# Patient Record
Sex: Male | Born: 1970 | Race: Black or African American | Hispanic: No | Smoking: Current some day smoker
Health system: Southern US, Community
[De-identification: ages and names within clinical notes are randomized; demographics above are authoritative.]

## PROBLEM LIST (undated history)

## (undated) DIAGNOSIS — I429 Cardiomyopathy, unspecified: Secondary | ICD-10-CM

## (undated) DIAGNOSIS — I10 Essential (primary) hypertension: Secondary | ICD-10-CM

## (undated) DIAGNOSIS — J45909 Unspecified asthma, uncomplicated: Secondary | ICD-10-CM

## (undated) DIAGNOSIS — F1010 Alcohol abuse, uncomplicated: Secondary | ICD-10-CM

## (undated) DIAGNOSIS — R0600 Dyspnea, unspecified: Secondary | ICD-10-CM

## (undated) HISTORY — PX: TEE WITHOUT CARDIOVERSION: SHX5443

## (undated) HISTORY — DX: Alcohol abuse, uncomplicated: F10.10

## (undated) HISTORY — DX: Cardiomyopathy, unspecified: I42.9

## (undated) HISTORY — DX: Essential (primary) hypertension: I10

## (undated) NOTE — *Deleted (*Deleted)
***In Progress*** Referring Physician: PCP: Reece Leader, DO PCP-Cardiologist: Thurmon Fair, MD  AHF: Dr. Shirlee Latch   HPI:  Bryan Guerrero is a 81 year old with a history of chronic systolic heart failure dating back to 2020, ETOH abuse, HTN, and tobacco abuse.  He was admitted in February 2020 with increased shortness of breath. At that time he was drinking 1/5 of liquor per day. ECHO was completed and showed severely reduced EF 15-20%. 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 Did not have a formal cath. He was discharged 08/08/19. He returned for one appointment with cardiology but did not return for f/u after that.   He was recently admitted 10/21 for a/c systolic heart failure and atrial flutter w/ RVR requiring DCCV. He also had low output requiring milrinone. R/LHC showed mildly elevated PCWP, normal RA pressure, normal cardiac output on milrinone and no significant coronary disease. Echo showed severe LVEF <20%, severe RV dysfunction and severe MR. He was diuresed w/ IV Lasix and was able to wean off milrinone. Discharge wt was 186 lbs.   He recently presented to clinic on 04/06/20 for f/u with Bryan Lis, PA. He was doing well and had no significant complaints. NYHA Class II symptoms. He reported full compliance w/ mediations and was tolerating well with no side effects. Wt was 192 lbs which was up 6 lbs since discharge. He reported cutting back significantly on ETOH.   Today he returns to HF clinic for pharmacist medication titration. At last visit with APP, Bryan Guerrero was increased to 49/51 mg BID and amiodarone decreased to 200 mg daily. Digoxin level was also elevated, so digoxin level was held for 3 days and decreased to 0.0625 mg daily.  Last time 130/87, HR 83, 192 lbs Plan A: Increase Entresto Plan B: Start BiDil (Imdur 30, hydral 25) Plan C: BB only if euvolemic, also RV was severely down F/u: Pharm in 4-5 weeks Labs: not needed today    Overall feeling ***.  Dizziness, lightheadedness, fatigue:  Chest pain or palpitations:  How is your breathing?: *** SOB: Able to complete all ADLs. Activity level ***  Weight at home pounds. Takes furosemide/torsemide/bumex *** mg *** daily.  LEE PND/Orthopnea  Appetite *** Low-salt diet:   Physical Exam Cost/affordability of meds -    HF Medications: Entresto 49/51 mg BID Spironolactone 25 mg daily Farxiga 10 mg daily Digoxin 0.0625 mg daily  Has the patient been experiencing any side effects to the medications prescribed?  {YES NO:22349}  Does the patient have any problems obtaining medications due to transportation or finances?   No - Eliquis from [facility]  Understanding of regimen: {excellent/good/fair/poor:19665} Understanding of indications: {excellent/good/fair/poor:19665} Potential of compliance: {excellent/good/fair/poor:19665} Patient understands to avoid NSAIDs. Patient understands to avoid decongestants.    Pertinent Lab Values 04/16/20: . Serum creatinine 1.18, BUN 13, Potassium 4.3, Sodium 139, Digoxin 0.5   Vital Signs: . Weight: *** (last clinic weight: 192 lbs) . Blood pressure: ***  . Heart rate: ***   Assessment: 1. Chronic Systolic CHF: recent admit 10/21 for a/c CHF w/ low output requiring milrinone. Biventricular failure. ECHO on 10/21 with EF < 20%, severe RV dysfunction, severe MR. LHC showed no significant coronary disease. CO improved on milrinone and was able to wean off. cMRI c/w ETOH CM  - NYHA Class II symptoms. Volume up 6 lbs since discharge, not on diuretic. - Continue*** Entresto ***49/51 mg BID - Continue spironolactone 25 mg daily - Continue Farxiga 10 mg daily - Continue digoxin 0.0625 mg daily -  Needs complete abstinence from ETOH. Previously discussed in detail. - Previously deemed not a candidate for advanced therapies unless he quits drinkingand is compliant with followup.    2. Atrial flutter: s/p DCCV 10/21. Suspect recurrence risk is high.  Maintaining NSR on amiodarone by recent EKG   - Continue amiodarone 200 once daily  - Continue Eliquis. No bleeding. Check CBC today  - Consider ablation in the future. Previously referred to EP   3. Stage 3 CKD: recent SCr during hospitalization spiked to 2.9, in setting of low output/ cardiorenal. Improved w/ inotropes and diuresis, down to 1.4 day of discharge  5. ETOH abuse: Strongly urged cessation.   6. Smoking: Urged cessation.  7. MR: severe on echo, likely functional - Denies dyspnea/ CP    Plan: 1) Medication changes: Based on clinical presentation, vital signs and recent labs will *** 2) Labs: *** 3) Follow-up: ***  Tama Headings, PharmD PGY2 Cardiology Pharmacy Resident  Karle Plumber, PharmD, BCPS, [facility], CPP Heart Failure Clinic Pharmacist [phone]

---

## 2018-08-04 ENCOUNTER — Emergency Department (HOSPITAL_COMMUNITY): Payer: Self-pay

## 2018-08-04 ENCOUNTER — Other Ambulatory Visit: Payer: Self-pay

## 2018-08-04 ENCOUNTER — Encounter (HOSPITAL_COMMUNITY): Payer: Self-pay

## 2018-08-04 ENCOUNTER — Emergency Department (HOSPITAL_BASED_OUTPATIENT_CLINIC_OR_DEPARTMENT_OTHER): Payer: Self-pay

## 2018-08-04 ENCOUNTER — Inpatient Hospital Stay (HOSPITAL_COMMUNITY)
Admission: EM | Admit: 2018-08-04 | Discharge: 2018-08-07 | DRG: 292 | Disposition: A | Discharge status: Home / Self Care | Payer: Self-pay | Attending: Family Medicine | Admitting: Family Medicine

## 2018-08-04 DIAGNOSIS — I509 Heart failure, unspecified: Secondary | ICD-10-CM

## 2018-08-04 DIAGNOSIS — J449 Chronic obstructive pulmonary disease, unspecified: Secondary | ICD-10-CM | POA: Diagnosis present

## 2018-08-04 DIAGNOSIS — I5082 Biventricular heart failure: Secondary | ICD-10-CM | POA: Diagnosis present

## 2018-08-04 DIAGNOSIS — Z79899 Other long term (current) drug therapy: Secondary | ICD-10-CM

## 2018-08-04 DIAGNOSIS — Z791 Long term (current) use of non-steroidal anti-inflammatories (NSAID): Secondary | ICD-10-CM

## 2018-08-04 DIAGNOSIS — R0602 Shortness of breath: Secondary | ICD-10-CM | POA: Diagnosis present

## 2018-08-04 DIAGNOSIS — I428 Other cardiomyopathies: Secondary | ICD-10-CM | POA: Diagnosis present

## 2018-08-04 DIAGNOSIS — I5023 Acute on chronic systolic (congestive) heart failure: Secondary | ICD-10-CM | POA: Diagnosis present

## 2018-08-04 DIAGNOSIS — R609 Edema, unspecified: Secondary | ICD-10-CM

## 2018-08-04 DIAGNOSIS — I472 Ventricular tachycardia: Secondary | ICD-10-CM | POA: Diagnosis not present

## 2018-08-04 DIAGNOSIS — F17290 Nicotine dependence, other tobacco product, uncomplicated: Secondary | ICD-10-CM

## 2018-08-04 DIAGNOSIS — I110 Hypertensive heart disease with heart failure: Principal | ICD-10-CM | POA: Diagnosis present

## 2018-08-04 DIAGNOSIS — D649 Anemia, unspecified: Secondary | ICD-10-CM | POA: Diagnosis present

## 2018-08-04 DIAGNOSIS — F1010 Alcohol abuse, uncomplicated: Secondary | ICD-10-CM | POA: Diagnosis present

## 2018-08-04 HISTORY — DX: Unspecified asthma, uncomplicated: J45.909

## 2018-08-04 LAB — COMPREHENSIVE METABOLIC PANEL
ALT: 46 U/L — ABNORMAL HIGH (ref 0–44)
AST: 45 U/L — ABNORMAL HIGH (ref 15–41)
Albumin: 3.8 g/dL (ref 3.5–5.0)
Alkaline Phosphatase: 60 U/L (ref 38–126)
Anion gap: 9 (ref 5–15)
BUN: 10 mg/dL (ref 6–20)
CO2: 23 mmol/L (ref 22–32)
Calcium: 9.3 mg/dL (ref 8.9–10.3)
Chloride: 107 mmol/L (ref 98–111)
Creatinine, Ser: 1.19 mg/dL (ref 0.61–1.24)
GFR calc Af Amer: >60 mL/min (ref >60)
GFR calc non Af Amer: >60 mL/min (ref >60)
Glucose, Bld: 122 mg/dL — ABNORMAL HIGH (ref 70–99)
Potassium: 4.4 mmol/L (ref 3.5–5.1)
Sodium: 139 mmol/L (ref 135–145)
Total Bilirubin: 1.1 mg/dL (ref 0.3–1.2)
Total Protein: 6.7 g/dL (ref 6.5–8.1)

## 2018-08-04 LAB — CBC WITH DIFFERENTIAL/PLATELET
Abs Immature Granulocytes: 0.01 10*3/uL (ref 0.00–0.07)
Basophils Absolute: 0 10*3/uL (ref 0.0–0.1)
Basophils Relative: 1 %
Eosinophils Absolute: 0.1 10*3/uL (ref 0.0–0.5)
Eosinophils Relative: 1 %
HCT: 40.2 % (ref 39.0–52.0)
Hemoglobin: 12.3 g/dL — ABNORMAL LOW (ref 13.0–17.0)
Immature Granulocytes: 0 %
Lymphocytes Relative: 42 %
Lymphs Abs: 1.8 10*3/uL (ref 0.7–4.0)
MCH: 30 pg (ref 26.0–34.0)
MCHC: 30.6 g/dL (ref 30.0–36.0)
MCV: 98 fL (ref 80.0–100.0)
Monocytes Absolute: 0.5 10*3/uL (ref 0.1–1.0)
Monocytes Relative: 12 %
Neutro Abs: 1.9 10*3/uL (ref 1.7–7.7)
Neutrophils Relative %: 44 %
Platelets: 150 10*3/uL (ref 150–400)
RBC: 4.1 MIL/uL — ABNORMAL LOW (ref 4.22–5.81)
RDW: 13.2 % (ref 11.5–15.5)
WBC: 4.4 10*3/uL (ref 4.0–10.5)
nRBC: 0 % (ref 0.0–0.2)

## 2018-08-04 LAB — BRAIN NATRIURETIC PEPTIDE: B Natriuretic Peptide: 1754.9 pg/mL — ABNORMAL HIGH (ref 0.0–100.0)

## 2018-08-04 LAB — TROPONIN I: Troponin I: 0.03 ng/mL (ref <0.03)

## 2018-08-04 MED ORDER — LORAZEPAM 1 MG PO TABS: 1.0000 mg | ORAL_TABLET | Freq: Four times a day (QID) | ORAL | Status: DC | PRN

## 2018-08-04 MED ORDER — FUROSEMIDE 10 MG/ML IJ SOLN
40.0000 mg | Freq: Once | INTRAMUSCULAR | Status: AC
  Administered 2018-08-04: 40 mg via INTRAVENOUS
  Filled 2018-08-04: qty 4

## 2018-08-04 MED ORDER — VITAMIN B-1 100 MG PO TABS
100.0000 mg | ORAL_TABLET | Freq: Every day | ORAL | Status: DC
  Administered 2018-08-05 – 2018-08-07 (×3): 100 mg via ORAL
  Filled 2018-08-04 (×3): qty 1

## 2018-08-04 MED ORDER — ONDANSETRON HCL 4 MG/2ML IJ SOLN: 4.0000 mg | Freq: Four times a day (QID) | INTRAMUSCULAR | Status: DC | PRN

## 2018-08-04 MED ORDER — FOLIC ACID 1 MG PO TABS
1.0000 mg | ORAL_TABLET | Freq: Every day | ORAL | Status: DC
  Administered 2018-08-05 – 2018-08-07 (×3): 1 mg via ORAL
  Filled 2018-08-04 (×3): qty 1

## 2018-08-04 MED ORDER — HYDRALAZINE HCL 20 MG/ML IJ SOLN
10.0000 mg | INTRAMUSCULAR | Status: DC | PRN
  Administered 2018-08-04 – 2018-08-05 (×2): 10 mg via INTRAVENOUS
  Filled 2018-08-04 (×3): qty 1

## 2018-08-04 MED ORDER — ENOXAPARIN SODIUM 40 MG/0.4ML ~~LOC~~ SOLN
40.0000 mg | SUBCUTANEOUS | Status: DC
  Administered 2018-08-05 – 2018-08-06 (×2): 40 mg via SUBCUTANEOUS
  Filled 2018-08-04 (×2): qty 0.4

## 2018-08-04 MED ORDER — LORAZEPAM 2 MG/ML IJ SOLN: 1.0000 mg | Freq: Four times a day (QID) | INTRAMUSCULAR | Status: DC | PRN

## 2018-08-04 MED ORDER — FUROSEMIDE 10 MG/ML IJ SOLN
40.0000 mg | Freq: Once | INTRAMUSCULAR | Status: AC
  Administered 2018-08-05: 40 mg via INTRAVENOUS
  Filled 2018-08-04: qty 4

## 2018-08-04 MED ORDER — ACETAMINOPHEN 325 MG PO TABS: 650.0000 mg | ORAL_TABLET | Freq: Four times a day (QID) | ORAL | Status: DC | PRN

## 2018-08-04 MED ORDER — THIAMINE HCL 100 MG/ML IJ SOLN: 100.0000 mg | Freq: Every day | INTRAMUSCULAR | Status: DC

## 2018-08-04 MED ORDER — ADULT MULTIVITAMIN W/MINERALS CH
1.0000 | ORAL_TABLET | Freq: Every day | ORAL | Status: DC
  Administered 2018-08-05 – 2018-08-07 (×3): 1 via ORAL
  Filled 2018-08-04 (×3): qty 1

## 2018-08-04 NOTE — ED Provider Notes (Signed)
[facility] EMERGENCY DEPARTMENT Provider Note   CSN: [id number] Arrival date & time: 08/04/18  1531    History   Chief Complaint Chief Complaint  Patient presents with  . Chest Pain  . Shortness of Breath    HPI Bryan Guerrero is a 48 y.o. male.     HPI  48 year old male presents with shortness of breath for at least 2 weeks.  He states that at rest he is okay but after ambulation, going up stairs, or especially at night he feels dyspneic.  A little bit of cough but no significant cough or coughing up phlegm.  No fevers.  He has been having on and off chest pain that he describes as pressure inferior to his left chest and comes and goes very briefly.  Last seconds other times and does not correlate with the shortness of breath.  Nothing specific makes it comes and go and is usually about 3 or 4 times a day.  He is also noticed left foot swelling.  No travel or surgeries.  Longtime smoker but states he has not smoked in about 3 weeks.  He drinks at least 4 or 5 shots per day of alcohol.  He has a history of hypertension but is not on medicines.  He denies any other known past medical history or family history of cardiac disease.  Past Medical History:  Diagnosis Date  . Asthma     There are no active problems to display for this patient.   History reviewed. No pertinent surgical history.      Home Medications    Prior to Admission medications   Medication Sig Start Date End Date Taking? Authorizing Provider  EPINEPHrine (PRIMATENE MIST) 0.125 MG/ACT AERO Inhale 1 puff into the lungs 3 (three) times daily as needed (shortness od breath).   Yes [provider]  ibuprofen (ADVIL,MOTRIN) 200 MG tablet Take 400 mg by mouth every 6 (six) hours as needed for headache or mild pain.   Yes [provider]  Multiple Vitamin (MULTIVITAMIN WITH MINERALS) TABS tablet Take 1 tablet by mouth daily.   Yes [provider]    Family  History History reviewed. No pertinent family history.  Social History Social History   Tobacco Use  . Smoking status: Former Smoker    Years: 15.00    Types: Cigars    Last attempt to quit: 07/14/2018    Years since quitting: 0.0  . Smokeless tobacco: Never Used  Substance Use Topics  . Alcohol use: Yes    Alcohol/week: 51.0 standard drinks    Types: 50 Shots of liquor, 1 Cans of beer per week  . Drug use: Yes    Types: Marijuana     Allergies   Patient has no known allergies.   Review of Systems Review of Systems  Constitutional: Negative for fever.  Respiratory: Positive for cough and shortness of breath.   Cardiovascular: Positive for chest pain and leg swelling.  All other systems reviewed and are negative.    Physical Exam Updated Vital Signs BP (!) 148/115   Pulse (!) 109   Temp 98.1 F (36.7 C) (Oral)   Resp 18   Ht 6' (1.829 m)   Wt 88.5 kg   SpO2 99%   BMI 26.45 kg/m   Physical Exam Vitals signs and nursing note reviewed.  Constitutional:      General: He is not in acute distress.    Appearance: He is well-developed. He is not  ill-appearing or diaphoretic.  HENT:     Head: Normocephalic and atraumatic.     Right Ear: External ear normal.     Left Ear: External ear normal.     Nose: Nose normal.  Eyes:     General:        Right eye: No discharge.        Left eye: No discharge.  Neck:     Musculoskeletal: Neck supple.  Cardiovascular:     Rate and Rhythm: Regular rhythm. Tachycardia present.     Heart sounds: Normal heart sounds.  Pulmonary:     Effort: Pulmonary effort is normal.     Breath sounds: Examination of the right-lower field reveals decreased breath sounds. Examination of the left-lower field reveals decreased breath sounds. Decreased breath sounds present.  Abdominal:     Palpations: Abdomen is soft.     Tenderness: There is no abdominal tenderness.  Musculoskeletal:     Left lower leg: Edema present.     Comments: Swelling  to the Left foot and distal leg, no skin color change  Skin:    General: Skin is warm and dry.  Neurological:     Mental Status: He is alert.  Psychiatric:        Mood and Affect: Mood is not anxious.      ED Treatments / Results  Labs (all labs ordered are listed, but only abnormal results are displayed) Labs Reviewed  COMPREHENSIVE METABOLIC PANEL - Abnormal; Notable for the following components:      Result Value   Glucose, Bld 122 (*)    AST 45 (*)    ALT 46 (*)    All other components within normal limits  BRAIN NATRIURETIC PEPTIDE - Abnormal; Notable for the following components:   B Natriuretic Peptide 1,754.9 (*)    All other components within normal limits  TROPONIN I - Abnormal; Notable for the following components:   Troponin I 0.03 (*)    All other components within normal limits  CBC WITH DIFFERENTIAL/PLATELET - Abnormal; Notable for the following components:   RBC 4.10 (*)    Hemoglobin 12.3 (*)    All other components within normal limits    EKG EKG Interpretation  Date/Time:  Saturday August 04 2018 15:53:21 EST Ventricular Rate:  117 PR Interval:    QRS Duration: 110 QT Interval:  311 QTC Calculation: 434 R Axis:   19 Text Interpretation:  Sinus tachycardia Multiple ventricular premature complexes Biatrial enlargement LVH with secondary repolarization abnormality No old tracing to compare Confirmed by Pricilla Loveless [phone]) on 08/04/2018 4:14:49 PM   Radiology Dg Chest 2 View  Result Date: 08/04/2018 CLINICAL DATA:  Shortness of breath, left side chest pain EXAM: CHEST - 2 VIEW COMPARISON:  None. FINDINGS: Heart is enlarged. Hyperinflation of the lungs. Peribronchial thickening. No confluent opacity or effusion. No acute bony abnormality. IMPRESSION: Cardiomegaly. COPD.  Bronchitic changes. Electronically Signed   By: Charlett Nose M.D.   On: 08/04/2018 17:14   Vas [place] Lower Extremity Venous (dvt) (mc And Wl 7a-7p)  Result Date: 08/04/2018  Lower  Venous Study Indications: Edema.  Comparison Study: No prior study on file Performing Technologist: Sherren Kerns RVS  Examination Guidelines: A complete evaluation includes B-mode imaging, spectral Doppler, color Doppler, and power Doppler as needed of all accessible portions of each vessel. Bilateral testing is considered an integral part of a complete examination. Limited examinations for reoccurring indications may be performed as noted.  Right Venous Findings: +---+---------------+---------+-----------+----------+---------+  CompressibilityPhasicitySpontaneityPropertiesSummary   +---+---------------+---------+-----------+----------+---------+ CFVFull                                         pulsatile +---+---------------+---------+-----------+----------+---------+  Left Venous Findings: +---------+---------------+---------+-----------+----------+---------+          CompressibilityPhasicitySpontaneityPropertiesSummary   +---------+---------------+---------+-----------+----------+---------+ CFV      Full                                         pulsatile +---------+---------------+---------+-----------+----------+---------+ SFJ      Full                                                   +---------+---------------+---------+-----------+----------+---------+ FV Prox  Full                                         pulsatile +---------+---------------+---------+-----------+----------+---------+ FV Mid   Full                                                   +---------+---------------+---------+-----------+----------+---------+ FV DistalFull                                                   +---------+---------------+---------+-----------+----------+---------+ PFV      Full                                                   +---------+---------------+---------+-----------+----------+---------+ POP      Full                                          pulsatile +---------+---------------+---------+-----------+----------+---------+ PTV      Full                                                   +---------+---------------+---------+-----------+----------+---------+ PERO     Full                                                   +---------+---------------+---------+-----------+----------+---------+    Summary: Right: No evidence of common femoral vein obstruction. Left: There is no evidence of deep vein thrombosis in the lower extremity. Waveforms are pulsatile throughout, suggestive of fluid overload Ultrasound characteristics of enlarged lymph nodes noted in the groin. Interstitial fluid noted throughout the left calf.  *  See table(s) above for measurements and observations. Electronically signed by Waverly Ferrari MD on 08/04/2018 at 6:06:08 PM.    Final     Procedures Procedures (including critical care time)  Medications Ordered in ED Medications  furosemide (LASIX) injection 40 mg (40 mg Intravenous Given 08/04/18 1756)     Initial Impression / Assessment and Plan / ED Course  I have reviewed the triage vital signs and the nursing notes.  Pertinent labs & imaging results that were available during my care of the patient were reviewed by me and considered in my medical decision making (see chart for details).        Patient's lab work/work-up is consistent with CHF.  Unclear why he is having unilateral swelling but there is no DVT.  He will be diuresed.  The low level troponin is probably from CHF rather than ACS.  I think PE is unlikely.  He is not hypoxic or in distress.  Probably this is related to combination of uncontrolled hypertension and alcohol abuse.  I discussed with internal medicine teaching service who will admit for diuresis and work-up.  Final Clinical Impressions(s) / ED Diagnoses   Final diagnoses:  Acute congestive heart failure, unspecified heart failure type [facility])    ED Discharge Orders    None        Pricilla Loveless, MD 08/04/18 [phone]

## 2018-08-04 NOTE — ED Notes (Signed)
Patient transported to X-ray 

## 2018-08-04 NOTE — H&P (Addendum)
Family Medicine Teaching [facility] Admission History and Physical Service Pager: [phone]  Patient name: Bryan Guerrero Medical record number: [id number] Date of birth: 12-17-1970 Age: 48 y.o. Gender: male  Primary Care Provider: Patient, No Pcp Per Consultants: None Code Status: Full  Chief Complaint: Worsening shortness of breath  Assessment and Plan: Bryan Guerrero is a 48 y.o. male with a past medical history significant for alcohol and tobacco abuse who presents today complaining of worsening shortness of breath for the past 2 weeks in the setting of new CHF diagnosis.  #Shortness of breath, worsening Patient initially presented with worsening shortness of breath for the past 2 weeks.  Patient reports lower extremity edema, orthopnea and difficulty breathing.  Patient denies any similar presentation in the past.  Patient 's CXR showed cardiomegaly and evidence COPD/bronchitis with minimal evidence for volume overload/pulmonary edema.  Initial troponin is 0.03, BNP was elevated 1754.9.  Given lower extremity swelling there was concerns for possible DVT.  Doppler ultrasound was negative.  EKG showed evidence of LVH.  On admission blood pressure was elevated 161/124.  Patient denies any viral URI symptoms in the past 2 weeks.  He continues to smoke about 2 cigars /ady.  Patient also endorses significant alcohol consumption 1/2 a fifth of liquor every night.  Patient symptoms and laboratory findings consistent with new diagnosis of CHF.  Suspect patient has a mixed picture of chronic uncontrolled hypertension with likely cardiomyopathy in the setting of significant alcohol use. --Admit to FMTS, admitting physician Dr. Deirdre Priest --Medical telemetry --Follow-up on complete echocardiogram --Trend troponins --Lasix 40 mg IV x1 --Follow-up on CBC and BMP --Acetaminophen 650 mg every 6 as needed --Zofran 4 mg every 6 as needed --Follow-up on PT eval  #New diagnosis CHF Patient  presented with worsening shortness of breath for the past 2 weeks and found to have an elevated BNP with orthopnea lower extremity swelling.  CXR showed cardiomegaly but no clear evidence of volume overload.  Patient also presented with evidence of LVH in the setting of elevated blood pressure with diastolic> 100.  Patient also has significant history of alcohol consumption.  Suspect new diagnosis of CHF secondary to alcohol abuse as well as chronic uncontrolled hypertension. --Follow-up on echo --Patient will need to be started on beta-blocker, ACE-i/ARB, Lasix --Daily weights strict I's and O's  #Elevated blood pressure On admission blood pressure 161/124.  Blood pressure has been consistently elevated.  He has been accompanied with tachycardia.  EKG showed evidence of LVH.  Patient presentation is suspicious for new diagnosis of CHF in the setting of chronic uncontrolled hypertension.  Patient has elevated diastolic pressure.  Suspect echocardiogram will show diastolic dysfunction. --Patient will need to be started on beta-blocker and ACE-i/ARB prior to discharge --PRN hydralazine 10 mg for elevated blood pressure  #Anemia, mild Hemoglobin 12.3, mild anemia likely secondary to alcohol abuse possible kidney involvement given chronic uncontrolled hypertension.  Could also be dilutional in the setting of increased volume. --Follow-up on ferritin --Follow-up on a.m. CBC and BMP  #Alcohol abuse Patient reports drinking about half 1/5 of liquor every night.  Patient reports that he has been doing that for very long time.  Patient reports that he has been trying to cut down but has been experiencing symptoms of shaking which has led to more drinking in order to appease symptoms.  Story was confirmed by wife who was at bedside.  Denies any history of DVT.  Given patient's history and description of what appears to  be withdrawal symptoms will place on CIWA protocol with low threshold for higher level of  care if needed. --CIWA --Start folic acid and thiamine  --Transfer to stepdown/ICU if symptomatic patient may require Precedex drip   #Tobacco use Patient reports smoking about 2 to 3 cigars every day.  Patient reports he has been cutting back and quit smoking 2 weeks ago.  Patient has declined nicotine patch while inpatient.  We will continue to monitor symptoms.   FEN/GI: Heart healthy diet Prophylaxis: Lovenox  Disposition: Home pending cardiac work-up  History of Present Illness:  Bryan Guerrero is a 48 y.o. male has a past medical history significant for alcohol and tobacco use who presents today complaining of worsening shortness of breath over the past 2 weeks.  Patient reports that he has been requiring more pillows in bed at night in order to be able to sleep.  Patient also reports bilateral lower extremity swelling most pedal   Patient denies taking any medications but endorses significant alcohol consumption history.  Patient denies any history of viral URI or other sick contacts.  Patient reports significant alcohol and tobacco consumption.  Patient denies any history of DVT but does endorse shakiness and vital abnormality consistent for chest x-ray.  Reports quitting smoking 2 weeks ago. In the ED patient was found to have an elevated BNP 1700, with bilateral lower extremity edema and evidence of LVH on EKG.  CBC and BMP were otherwise unremarkable.  Patient received 40 mg Lasix one-time family medicine was consulted for admission.   Review Of Systems: Per HPI with the following additions:  Review of Systems  Constitutional: Negative.   HENT: Negative.   Eyes: Negative.   Respiratory: Positive for shortness of breath.   Cardiovascular: Negative.   Gastrointestinal: Negative.   Skin: Negative.   Neurological: Negative.   Endo/Heme/Allergies: Negative.   Psychiatric/Behavioral: Negative.     There are no active problems to display for this patient.   Past Medical  History: Past Medical History:  Diagnosis Date  . Asthma     Past Surgical History: History reviewed. No pertinent surgical history.  Social History: Social History   Tobacco Use  . Smoking status: Former Smoker    Years: 15.00    Types: Cigars    Last attempt to quit: 07/14/2018    Years since quitting: 0.0  . Smokeless tobacco: Never Used  Substance Use Topics  . Alcohol use: Yes    Alcohol/week: 51.0 standard drinks    Types: 50 Shots of liquor, 1 Cans of beer per week  . Drug use: Yes    Types: Marijuana   Additional social history:  Please also refer to relevant sections of EMR.  Family History: History reviewed. No pertinent family history. (If not completed, MUST add something in)  Allergies and Medications: No Known Allergies No current facility-administered medications on file prior to encounter.    Current Outpatient Medications on File Prior to Encounter  Medication Sig Dispense Refill  . EPINEPHrine (PRIMATENE MIST) 0.125 MG/ACT AERO Inhale 1 puff into the lungs 3 (three) times daily as needed (shortness od breath).    Marland Kitchen ibuprofen (ADVIL,MOTRIN) 200 MG tablet Take 400 mg by mouth every 6 (six) hours as needed for headache or mild pain.    . Multiple Vitamin (MULTIVITAMIN WITH MINERALS) TABS tablet Take 1 tablet by mouth daily.      Objective: BP (!) 148/115   Pulse (!) 109   Temp 98.1 F (36.7 C) (Oral)  Resp 18   Ht 6' (1.829 m)   Wt 88.5 kg   SpO2 99%   BMI 26.45 kg/m  Exam: General: NAD, pleasant, able to participate in exam Cardiac: RRR, normal heart sounds, no murmurs. 2+ radial and PT pulses bilaterally Respiratory: CTAB, normal effort, No wheezes, rales or rhonchi Abdomen: soft, nontender, nondistended, no hepatic or splenomegaly, +BS Extremities: no edema or cyanosis. WWP.  +1 pitting edema bilaterally. Skin: warm and dry, no rashes noted Neuro: alert and oriented x4, no focal deficits Psych: Normal affect and mood   Labs and  Imaging: CBC BMET  Recent Labs  Lab 08/04/18 1611  WBC 4.4  HGB 12.3*  HCT 40.2  PLT 150   Recent Labs  Lab 08/04/18 1611  NA 139  K 4.4  CL 107  CO2 23  BUN 10  CREATININE 1.19  GLUCOSE 122*  CALCIUM 9.3     Trop: 0.03 BNP:>1400  Dg Chest 2 View  Result Date: 08/04/2018 CLINICAL DATA:  Shortness of breath, left side chest pain EXAM: CHEST - 2 VIEW COMPARISON:  None. FINDINGS: Heart is enlarged. Hyperinflation of the lungs. Peribronchial thickening. No confluent opacity or effusion. No acute bony abnormality. IMPRESSION: Cardiomegaly. COPD.  Bronchitic changes. Electronically Signed   By: Charlett Nose M.D.   On: 08/04/2018 17:14   Vas [place] Lower Extremity Venous (dvt) (mc And Wl 7a-7p)  Result Date: 08/04/2018  Lower Venous Study Indications: Edema.  Comparison Study: No prior study on file Performing Technologist: Sherren Kerns RVS  Examination Guidelines: A complete evaluation includes B-mode imaging, spectral Doppler, color Doppler, and power Doppler as needed of all accessible portions of each vessel. Bilateral testing is considered an integral part of a complete examination. Limited examinations for reoccurring indications may be performed as noted.  Right Venous Findings: +---+---------------+---------+-----------+----------+---------+    CompressibilityPhasicitySpontaneityPropertiesSummary   +---+---------------+---------+-----------+----------+---------+ CFVFull                                         pulsatile +---+---------------+---------+-----------+----------+---------+  Left Venous Findings: +---------+---------------+---------+-----------+----------+---------+          CompressibilityPhasicitySpontaneityPropertiesSummary   +---------+---------------+---------+-----------+----------+---------+ CFV      Full                                         pulsatile +---------+---------------+---------+-----------+----------+---------+ SFJ      Full                                                    +---------+---------------+---------+-----------+----------+---------+ FV Prox  Full                                         pulsatile +---------+---------------+---------+-----------+----------+---------+ FV Mid   Full                                                   +---------+---------------+---------+-----------+----------+---------+ FV DistalFull                                                   +---------+---------------+---------+-----------+----------+---------+  PFV      Full                                                   +---------+---------------+---------+-----------+----------+---------+ POP      Full                                         pulsatile +---------+---------------+---------+-----------+----------+---------+ PTV      Full                                                   +---------+---------------+---------+-----------+----------+---------+ PERO     Full                                                   +---------+---------------+---------+-----------+----------+---------+    Summary: Right: No evidence of common femoral vein obstruction. Left: There is no evidence of deep vein thrombosis in the lower extremity. Waveforms are pulsatile throughout, suggestive of fluid overload Ultrasound characteristics of enlarged lymph nodes noted in the groin. Interstitial fluid noted throughout the left calf.  *See table(s) above for measurements and observations. Electronically signed by Waverly Ferrari MD on 08/04/2018 at 6:06:08 PM.    Final      Lovena Neighbours, MD [facility] Family Medicine, PGY-3

## 2018-08-04 NOTE — Progress Notes (Signed)
VASCULAR LAB PRELIMINARY  PRELIMINARY  PRELIMINARY  PRELIMINARY  Left lower extremity venous duplex completed.    Preliminary report:  See CV Proc  Gave results to Dr. Meriam Sprague, [facility], RVT 08/04/2018, 5:33 PM

## 2018-08-04 NOTE — ED Triage Notes (Signed)
Pt from home w/ a c/o SOB and CP that has been intermittent for the past two weeks. The pain is located on the left side of his chest and feels like a pressure. It does not radiate. It is not reproducible upon palpation. No recent extended travel. No N/V/D. No dizziness, lightheadedness, or weakness. No LOC.   Additional complaints of left leg swelling and mild pitted edema in BLE (worse on left side).

## 2018-08-05 ENCOUNTER — Inpatient Hospital Stay (HOSPITAL_COMMUNITY): Payer: Self-pay

## 2018-08-05 DIAGNOSIS — I5021 Acute systolic (congestive) heart failure: Secondary | ICD-10-CM

## 2018-08-05 DIAGNOSIS — I340 Nonrheumatic mitral (valve) insufficiency: Secondary | ICD-10-CM

## 2018-08-05 DIAGNOSIS — F1010 Alcohol abuse, uncomplicated: Secondary | ICD-10-CM

## 2018-08-05 DIAGNOSIS — I428 Other cardiomyopathies: Secondary | ICD-10-CM

## 2018-08-05 DIAGNOSIS — I10 Essential (primary) hypertension: Secondary | ICD-10-CM

## 2018-08-05 DIAGNOSIS — I509 Heart failure, unspecified: Secondary | ICD-10-CM

## 2018-08-05 LAB — CBC
HCT: 40.8 % (ref 39.0–52.0)
Hemoglobin: 13 g/dL (ref 13.0–17.0)
MCH: 30.1 pg (ref 26.0–34.0)
MCHC: 31.9 g/dL (ref 30.0–36.0)
MCV: 94.4 fL (ref 80.0–100.0)
PLATELETS: 162 10*3/uL (ref 150–400)
RBC: 4.32 MIL/uL (ref 4.22–5.81)
RDW: 13 % (ref 11.5–15.5)
WBC: 6.2 10*3/uL (ref 4.0–10.5)
nRBC: 0 % (ref 0.0–0.2)

## 2018-08-05 LAB — BASIC METABOLIC PANEL
ANION GAP: 12 (ref 5–15)
BUN: 13 mg/dL (ref 6–20)
CO2: 22 mmol/L (ref 22–32)
Calcium: 9.3 mg/dL (ref 8.9–10.3)
Chloride: 107 mmol/L (ref 98–111)
Creatinine, Ser: 1.17 mg/dL (ref 0.61–1.24)
GFR calc Af Amer: >60 mL/min (ref >60)
GFR calc non Af Amer: >60 mL/min (ref >60)
Glucose, Bld: 92 mg/dL (ref 70–99)
Potassium: 3.8 mmol/L (ref 3.5–5.1)
Sodium: 141 mmol/L (ref 135–145)

## 2018-08-05 LAB — CREATININE, SERUM
Creatinine, Ser: 1.29 mg/dL — ABNORMAL HIGH (ref 0.61–1.24)
GFR calc Af Amer: >60 mL/min (ref >60)
GFR calc non Af Amer: >60 mL/min (ref >60)

## 2018-08-05 LAB — TROPONIN I
Troponin I: 0.04 ng/mL (ref <0.03)
Troponin I: 0.04 ng/mL (ref <0.03)
Troponin I: 0.03 ng/mL (ref <0.03)

## 2018-08-05 LAB — ECHOCARDIOGRAM COMPLETE
Height: 72 in
Weight: 3164.04 oz

## 2018-08-05 LAB — TSH: TSH: 1.616 u[IU]/mL (ref 0.350–4.500)

## 2018-08-05 LAB — FERRITIN: Ferritin: 99 ng/mL (ref 24–336)

## 2018-08-05 LAB — HIV ANTIBODY (ROUTINE TESTING W REFLEX): HIV Screen 4th Generation wRfx: NONREACTIVE

## 2018-08-05 MED ORDER — PERFLUTREN LIPID MICROSPHERE
1.0000 mL | INTRAVENOUS | Status: AC | PRN
  Administered 2018-08-05: 2 mL via INTRAVENOUS
  Filled 2018-08-05: qty 10

## 2018-08-05 MED ORDER — LOSARTAN POTASSIUM 25 MG PO TABS
25.0000 mg | ORAL_TABLET | Freq: Every day | ORAL | Status: DC
  Administered 2018-08-05 – 2018-08-06 (×2): 25 mg via ORAL
  Filled 2018-08-05 (×2): qty 1

## 2018-08-05 MED ORDER — HYDRALAZINE HCL 20 MG/ML IJ SOLN: 10.0000 mg | INTRAMUSCULAR | Status: DC | PRN

## 2018-08-05 MED ORDER — CARVEDILOL 3.125 MG PO TABS
3.1250 mg | ORAL_TABLET | Freq: Two times a day (BID) | ORAL | Status: DC
  Administered 2018-08-05 – 2018-08-07 (×4): 3.125 mg via ORAL
  Filled 2018-08-05 (×4): qty 1

## 2018-08-05 NOTE — Progress Notes (Signed)
Family Medicine Teaching Service Daily Progress Note Intern Pager: [phone]  Patient name: Bryan Guerrero Medical record number: [id number] Date of birth: [birth year] Age: 48 y.o. Gender: male  Primary Care Provider: Patient, No Pcp Per Consultants: None Code Status: Full  Pt Overview and Major Events to Date:  Admitted 2/22  Assessment and Plan: Bryan Guerrero is a 48 y.o. male with a past medical history significant for alcohol and tobacco abuse who presents today complaining of worsening shortness of breath for the past 2 weeks in the setting of new CHF diagnosis.  #Dyspnea likely 2/2 new onset CHF: Acute, improving. Patient reports improvement of dyspnea and lower extremity swelling, almost back to baseline.  Troponin trended flat.  Lungs clear and no lower extremity swelling on exam.  Down -4.4L since admission, good diuresis with 40 mg of Lasix, unknown dry weight.  New onset CHF likely in the setting of chronic uncontrolled hypertension and  significant alcohol use, however will discuss with cards if further CAD workup needed.  --Consult cardiology, appreciate recs - Follow-up echocardiogram results when completed - Continue IV Lasix 40 mg - Monitor CBC and BMP - Strict I&O's, daily weights - Pulse ox, monitor vitals - Start losartan 25mg  following repeat BMP  - Will need to be started on beta-blocker, likely prior to discharge  #Elevated blood pressure: Likely chronic, uncontrolled.  Consistently elevated since admission, SBP 140-160's and DBP 90-130s.  With evidence of LVH on EKG, suspect this is been chronically uncontrolled. - Start losartan as above - Monitor BP  #Alcohol abuse: Chronic Patient reports drinking about half 1/5 of liquor every night.  Voices motivation to cut back, has experienced withdrawal symptoms before. --CIWA protocol, 0 thus far  --Cont folic acid and thiamine    #Tobacco use: Patient reports smoking about 2 to 3 cigars every day.  Previous  cigarette smoker.  Hyperinflation of lungs suggesting COPD on chest x-ray at admission. - Suggest obtaining PFTs outpatient  #Anemia, mild: Resolved. Hemoglobin 13, normocytic and normochromic.  Ferritin wnl.  Likely was dilutional, however will continue to monitor.   - Monitor CBC  FEN/GI: Heart healthy diet Prophylaxis: Lovenox  Disposition:  Continued inpatient care, additional diuresis and echo pending  Subjective:  No acute events overnight.  States he is feeling much better and has more energy.  Denies any further shortness of breath and feels that his leg swelling has gone down.  Did not sleep well last night due to the beds, not because of his breathing.  Has not tried to sleep flat yet.  States he has had a lot of diuresis with the Lasix.  Denies any chest pain, wheezing, coughing, dizziness/lightheadedness, or shortness of breath with walking.  Objective: Temp:  [98.1 F (36.7 C)-100.2 F (37.9 C)] 100.2 F (37.9 C) (02/22 2326) Pulse Rate:  [52-116] 115 (02/22 2326) Resp:  [11-20] 16 (02/22 2326) BP: (139-167)/(98-130) 145/108 (02/23 0338) SpO2:  [97 %-100 %] 100 % (02/22 2326) Weight:  [88.5 kg-89.7 kg] 89.7 kg (02/22 2112) Physical Exam: General: Alert, NAD, sitting up relaxing with wife at bedside HEENT: NCAT, MMM Cardiac: RRR no m/g/r Lungs: Clear bilaterally, no increased WOB, able to hold full conversation normally, denies symptoms orthopnea while laying flat during examination Abdomen: soft, non-tender, non-distended, normoactive BS Msk: Moves all extremities spontaneously  Ext: Warm, dry, 2+ distal pulses, no pitting edema noted to bilateral lower extremities  Laboratory: Recent Labs  Lab 08/04/18 1611 08/04/18 2354  WBC 4.4 6.2  HGB 12.3*  13.0  HCT 40.2 40.8  PLT 150 162   Recent Labs  Lab 08/04/18 1611 08/04/18 2354  NA 139  --   K 4.4  --   CL 107  --   CO2 23  --   BUN 10  --   CREATININE 1.19 1.29*  CALCIUM 9.3  --   PROT 6.7  --    BILITOT 1.1  --   ALKPHOS 60  --   ALT 46*  --   AST 45*  --   GLUCOSE 122*  --      Imaging/Diagnostic Tests: Dg Chest 2 View  Result Date: 08/04/2018 CLINICAL DATA:  Shortness of breath, left side chest pain EXAM: CHEST - 2 VIEW COMPARISON:  None. FINDINGS: Heart is enlarged. Hyperinflation of the lungs. Peribronchial thickening. No confluent opacity or effusion. No acute bony abnormality. IMPRESSION: Cardiomegaly. COPD.  Bronchitic changes. Electronically Signed   By: Bryan Guerrero M.D.   On: 08/04/2018 17:14   Vas [place] Lower Extremity Venous (dvt) (mc And Wl 7a-7p)  Result Date: 08/04/2018  Lower Venous Study Indications: Edema.  Comparison Study: No prior study on file Performing Technologist: Sherren Kerns RVS  Examination Guidelines: A complete evaluation includes B-mode imaging, spectral Doppler, color Doppler, and power Doppler as needed of all accessible portions of each vessel. Bilateral testing is considered an integral part of a complete examination. Limited examinations for reoccurring indications may be performed as noted.  Right Venous Findings: +---+---------------+---------+-----------+----------+---------+    CompressibilityPhasicitySpontaneityPropertiesSummary   +---+---------------+---------+-----------+----------+---------+ CFVFull                                         pulsatile +---+---------------+---------+-----------+----------+---------+  Left Venous Findings: +---------+---------------+---------+-----------+----------+---------+          CompressibilityPhasicitySpontaneityPropertiesSummary   +---------+---------------+---------+-----------+----------+---------+ CFV      Full                                         pulsatile +---------+---------------+---------+-----------+----------+---------+ SFJ      Full                                                   +---------+---------------+---------+-----------+----------+---------+ FV Prox   Full                                         pulsatile +---------+---------------+---------+-----------+----------+---------+ FV Mid   Full                                                   +---------+---------------+---------+-----------+----------+---------+ FV DistalFull                                                   +---------+---------------+---------+-----------+----------+---------+ PFV      Full                                                   +---------+---------------+---------+-----------+----------+---------+  POP      Full                                         pulsatile +---------+---------------+---------+-----------+----------+---------+ PTV      Full                                                   +---------+---------------+---------+-----------+----------+---------+ PERO     Full                                                   +---------+---------------+---------+-----------+----------+---------+    Summary: Right: No evidence of common femoral vein obstruction. Left: There is no evidence of deep vein thrombosis in the lower extremity. Waveforms are pulsatile throughout, suggestive of fluid overload Ultrasound characteristics of enlarged lymph nodes noted in the groin. Interstitial fluid noted throughout the left calf.  *See table(s) above for measurements and observations. Electronically signed by Waverly Ferrari MD on 08/04/2018 at 6:06:08 PM.    Final     Allayne Stack, DO 08/05/2018, 8:02 AM PGY-1, [facility] Family Medicine FPTS Intern pager: [phone], text pages welcome

## 2018-08-05 NOTE — Evaluation (Signed)
Physical Therapy Evaluation Patient Details Name: Bryan Guerrero MRN: [id number] DOB: [birth year] Today's Date: 08/05/2018   History of Present Illness  Bryan Guerrero is a 48 y.o. male with a past medical history significant for alcohol and tobacco abuse who presents today complaining of worsening shortness of breath for the past 2 weeks in the setting of new CHF diagnosis.    Clinical Impression  Patient evaluated by Physical Therapy with no further acute PT needs identified. All education has been completed and the patient has no further questions. PTA pt living with wife, working, independent with all mobility. Today ambulating unit and stairs without difficulty or assistance, SpO2 98%, HR 110 with activity. Discussed benefits of daily weighing, low sodium diet, and establishing daily aerobic exercise recommendations.  See below for any follow-up Physical Therapy or equipment needs. PT is signing off. Thank you for this referral.     Follow Up Recommendations No PT follow up    Equipment Recommendations  None recommended by PT    Recommendations for Other Services       Precautions / Restrictions        Mobility  Bed Mobility Overal bed mobility: Independent                Transfers Overall transfer level: Independent                  Ambulation/Gait Ambulation/Gait assistance: Independent Gait Distance (Feet): 250 Feet Assistive device: None Gait Pattern/deviations: WFL(Within Functional Limits)        Stairs Stairs: Yes Stairs assistance: Independent Stair Management: No rails Number of Stairs: 12 General stair comments: no issues, no desat with 1 flight.   Wheelchair Mobility    Modified Rankin (Stroke Patients Only)       Balance Overall balance assessment: Independent                                           Pertinent Vitals/Pain Pain Assessment: No/denies pain    Home Living Family/patient expects to be  discharged to:: Private residence Living Arrangements: Spouse/significant other Available Help at Discharge: Family;Available 24 hours/day Type of Home: Apartment Home Access: Stairs to enter Entrance Stairs-Rails: Can reach both Entrance Stairs-Number of Steps: 2 flights Home Layout: One level Home Equipment: None      Prior Function Level of Independence: Independent         Comments: Works at [employer]        Extremity/Trunk Assessment   Upper Extremity Assessment Upper Extremity Assessment: Overall WFL for tasks assessed    Lower Extremity Assessment Lower Extremity Assessment: Overall WFL for tasks assessed    Cervical / Trunk Assessment Cervical / Trunk Assessment: Normal  Communication   Communication: No difficulties  Cognition Arousal/Alertness: Awake/alert                                            General Comments      Exercises     Assessment/Plan    PT Assessment Patent does not need any further PT services  PT Problem List         PT Treatment Interventions      PT Goals (Current goals can be found in the  Care Plan section)  Acute Rehab PT Goals Patient Stated Goal: learn ways to handle CHF better PT Goal Formulation: With patient Time For Goal Achievement: 08/19/18 Potential to Achieve Goals: Good    Frequency     Barriers to discharge        Co-evaluation               AM-PAC PT "6 Clicks" Mobility  Outcome Measure Help needed turning from your back to your side while in a flat bed without using bedrails?: None Help needed moving from lying on your back to sitting on the side of a flat bed without using bedrails?: None Help needed moving to and from a bed to a chair (including a wheelchair)?: None Help needed standing up from a chair using your arms (e.g., wheelchair or bedside chair)?: None Help needed to walk in hospital room?: None Help needed climbing 3-5 steps with a  railing? : None 6 Click Score: 24    End of Session   Activity Tolerance: Patient tolerated treatment well Patient left: in bed Nurse Communication: Mobility status PT Visit Diagnosis: Unsteadiness on feet (R26.81)    Time: 1610-9604 PT Time Calculation (min) (ACUTE ONLY): 13 min   Charges:   PT Evaluation $PT Eval Low Complexity: 1 Low          Etta Grandchild, PT, DPT Acute Rehabilitation Services Pager: [phone] Office: [phone]    Etta Grandchild 08/05/2018, 10:47 AM

## 2018-08-05 NOTE — Consult Note (Addendum)
Cardiology Consultation:   Patient ID: GAILEN LIBERA MRN: [id number]; DOB: 07/29/1970  Admit date: 08/04/2018 Date of Consult: 08/05/2018  Primary Care Provider: Patient, No Pcp Per Primary Cardiologist: Dr Ladona Ridgel Primary Electrophysiologist:  None    Patient Profile:   Bryan Guerrero is a 48 y.o. male with a hx of HTN who is being seen today for the evaluation of cardiomyopathy at the request of Dr Deirdre Priest.  History of Present Illness:   Bryan Guerrero seen today in consult for cardiomyopathy.  Patient has no prior history of any cardiac issues.  He has untreated hypertension and drinks 1/5 of a gallon of liquor a day.  He denies any exertional chest pain.  As an outpatient he had noted increasing dyspnea on exertion, orthopnea, and lower extremity edema for about a month.  He says his wife finally got him to come to the emergency room yesterday afternoon.  He was noted to be in congestive heart failure and admitted for further evaluation.  Since admission he is diuresed 6-1/2 L.  Symptomatically he is much improved.  Unfortunately his echocardiogram shows an ejection fraction of 15 to 20% with global hypokinesis. PND and orthopnea are present.   Past Medical History:  Diagnosis Date  . Asthma     History reviewed. No pertinent surgical history.   Home Medications:  Prior to Admission medications   Medication Sig Start Date End Date Taking? Authorizing Provider  EPINEPHrine (PRIMATENE MIST) 0.125 MG/ACT AERO Inhale 1 puff into the lungs 3 (three) times daily as needed (shortness od breath).   Yes [provider]  ibuprofen (ADVIL,MOTRIN) 200 MG tablet Take 400 mg by mouth every 6 (six) hours as needed for headache or mild pain.   Yes [provider]  Multiple Vitamin (MULTIVITAMIN WITH MINERALS) TABS tablet Take 1 tablet by mouth daily.   Yes [provider]    Inpatient Medications: Scheduled Meds: . enoxaparin (LOVENOX) injection  40 mg Subcutaneous  Q24H  . folic acid  1 mg Oral Daily  . losartan  25 mg Oral Daily  . multivitamin with minerals  1 tablet Oral Daily  . thiamine  100 mg Oral Daily   Or  . thiamine  100 mg Intravenous Daily   Continuous Infusions:  PRN Meds: hydrALAZINE, LORazepam **OR** LORazepam  Allergies:   No Known Allergies  Social History:   Social History   Socioeconomic History  . Marital status: Single    Spouse name: Not on file  . Number of children: Not on file  . Years of education: Not on file  . Highest education level: Not on file  Occupational History  . Not on file  Social Needs  . Financial resource strain: Not on file  . Food insecurity:    Worry: Not on file    Inability: Not on file  . Transportation needs:    Medical: Not on file    Non-medical: Not on file  Tobacco Use  . Smoking status: Former Smoker    Years: 15.00    Types: Cigars    Last attempt to quit: 07/14/2018    Years since quitting: 0.0  . Smokeless tobacco: Never Used  Substance and Sexual Activity  . Alcohol use: Yes    Alcohol/week: 51.0 standard drinks    Types: 50 Shots of liquor, 1 Cans of beer per week  . Drug use: Yes    Types: Marijuana  . Sexual activity: Not on file  Lifestyle  . Physical activity:  Days per week: Not on file    Minutes per session: Not on file  . Stress: Not on file  Relationships  . Social connections:    Talks on phone: Not on file    Gets together: Not on file    Attends religious service: Not on file    Active member of club or organization: Not on file    Attends meetings of clubs or organizations: Not on file    Relationship status: Not on file  . Intimate partner violence:    Fear of current or ex partner: Not on file    Emotionally abused: Not on file    Physically abused: Not on file    Forced sexual activity: Not on file  Other Topics Concern  . Not on file  Social History Narrative  . Not on file    Family History:   History reviewed. No pertinent  family history.   ROS:  Please see the history of present illness.  All other ROS reviewed and negative.     Physical Exam/Data:   Vitals:   08/04/18 2326 08/05/18 0338 08/05/18 0809 08/05/18 1417  BP: (!) 152/98 (!) 145/108 (!) 149/120 (!) 138/103  Pulse: (!) 115  (!) 108   Resp: 16  16   Temp: 100.2 F (37.9 C)  98.4 F (36.9 C)   TempSrc: Oral  Oral   SpO2: 100%  100%   Weight:      Height:        Intake/Output Summary (Last 24 hours) at 08/05/2018 1503 Last data filed at 08/05/2018 1200 Gross per 24 hour  Intake 520 ml  Output 6975 ml  Net -6455 ml   Last 3 Weights 08/04/2018 08/04/2018  Weight (lbs) 197 lb 12 oz 195 lb  Weight (kg) 89.7 kg 88.451 kg     Body mass index is 26.82 kg/m.  General:  Well nourished, well developed, in no acute distress HEENT: normal Lymph: no adenopathy Neck: no JVD Endocrine:  No thryomegaly Vascular: No carotid bruits; FA pulses 2+ bilaterally without bruits  Cardiac:  normal S1, S2; RRR; no murmur  Lungs:  clear to auscultation bilaterally, no wheezing, rhonchi or rales  Abd: soft, nontender, no hepatomegaly  Ext: no edema Musculoskeletal:  No deformities, BUE and BLE strength normal and equal Skin: warm and dry  Neuro:  CNs 2-12 intact, no focal abnormalities noted Psych:  Normal affect   EKG:  The EKG was personally reviewed and demonstrates:  NSR, ST, LVH, PVCs Telemetry:  Telemetry was personally reviewed and demonstrates:  NSR  Relevant CV Studies: Echo 08/05/2018  IMPRESSIONS  1. The left ventricle has severely reduced systolic function, with an ejection fraction of 15-20%. The cavity size was moderately dilated. Left ventricular diastolic Doppler parameters are consistent with pseudonormalization. Left ventricular diffuse  hypokinesis with septal-lateral dyssynergy suggestive of nonischemic cardiomyopathy. No obvious LV mural thrombus visualized with Definity contrast.  2. The right ventricle has moderately reduced  systolic function. The cavity was normal. There is no increase in right ventricular wall thickness. Right ventricular systolic pressure could not be assessed.  3. Left atrial size was moderately dilated.  4. Small pericardial effusion.  5. The pericardial effusion is posterior to the left ventricle.  6. The mitral valve is normal in structure.  7. The tricuspid valve is normal in structure.  8. The aortic valve is tricuspid.  9. The aortic root is normal in size and structure.  Laboratory Data:  Chemistry Recent Labs  Lab 08/04/18 1611 08/04/18 2354 08/05/18 1128  NA 139  --  141  K 4.4  --  3.8  CL 107  --  107  CO2 23  --  22  GLUCOSE 122*  --  92  BUN 10  --  13  CREATININE 1.19 1.29* 1.17  CALCIUM 9.3  --  9.3  GFRNONAA >60 >60 >60  GFRAA >60 >60 >60  ANIONGAP 9  --  12    Recent Labs  Lab 08/04/18 1611  PROT 6.7  ALBUMIN 3.8  AST 45*  ALT 46*  ALKPHOS 60  BILITOT 1.1   Hematology Recent Labs  Lab 08/04/18 1611 08/04/18 2354  WBC 4.4 6.2  RBC 4.10* 4.32  HGB 12.3* 13.0  HCT 40.2 40.8  MCV 98.0 94.4  MCH 30.0 30.1  MCHC 30.6 31.9  RDW 13.2 13.0  PLT 150 162   Cardiac Enzymes Recent Labs  Lab 08/04/18 1611 08/04/18 2354 08/05/18 0620 08/05/18 1128  TROPONINI 0.03* 0.04* 0.04* 0.03*   No results for input(s): TROPIPOC in the last 168 hours.  BNP Recent Labs  Lab 08/04/18 1611  BNP 1,754.9*    DDimer No results for input(s): DDIMER in the last 168 hours.  Radiology/Studies:  Dg Chest 2 View  Result Date: 08/04/2018 CLINICAL DATA:  Shortness of breath, left side chest pain EXAM: CHEST - 2 VIEW COMPARISON:  None. FINDINGS: Heart is enlarged. Hyperinflation of the lungs. Peribronchial thickening. No confluent opacity or effusion. No acute bony abnormality. IMPRESSION: Cardiomegaly. COPD.  Bronchitic changes. Electronically Signed   By: Charlett Nose M.D.   On: 08/04/2018 17:14   Vas [place] Lower Extremity Venous (dvt) (mc And Wl 7a-7p)  Result  Date: 08/04/2018  Lower Venous Study Indications: Edema.  Comparison Study: No prior study on file Performing Technologist: Sherren Kerns RVS  Examination Guidelines: A complete evaluation includes B-mode imaging, spectral Doppler, color Doppler, and power Doppler as needed of all accessible portions of each vessel. Bilateral testing is considered an integral part of a complete examination. Limited examinations for reoccurring indications may be performed as noted.  Right Venous Findings: +---+---------------+---------+-----------+----------+---------+    CompressibilityPhasicitySpontaneityPropertiesSummary   +---+---------------+---------+-----------+----------+---------+ CFVFull                                         pulsatile +---+---------------+---------+-----------+----------+---------+  Left Venous Findings: +---------+---------------+---------+-----------+----------+---------+          CompressibilityPhasicitySpontaneityPropertiesSummary   +---------+---------------+---------+-----------+----------+---------+ CFV      Full                                         pulsatile +---------+---------------+---------+-----------+----------+---------+ SFJ      Full                                                   +---------+---------------+---------+-----------+----------+---------+ FV Prox  Full                                         pulsatile +---------+---------------+---------+-----------+----------+---------+ FV Mid   Full                                                   +---------+---------------+---------+-----------+----------+---------+  FV DistalFull                                                   +---------+---------------+---------+-----------+----------+---------+ PFV      Full                                                   +---------+---------------+---------+-----------+----------+---------+ POP      Full                                          pulsatile +---------+---------------+---------+-----------+----------+---------+ PTV      Full                                                   +---------+---------------+---------+-----------+----------+---------+ PERO     Full                                                   +---------+---------------+---------+-----------+----------+---------+    Summary: Right: No evidence of common femoral vein obstruction. Left: There is no evidence of deep vein thrombosis in the lower extremity. Waveforms are pulsatile throughout, suggestive of fluid overload Ultrasound characteristics of enlarged lymph nodes noted in the groin. Interstitial fluid noted throughout the left calf.  *See table(s) above for measurements and observations. Electronically signed by Waverly Ferrari MD on 08/04/2018 at 6:06:08 PM.    Final     Assessment and Plan:   Acute CHF- Currently stable on exam  NICM Suspect this is from untreated HTN and ETOH  Plan- Cozaar and Lasix added- consider adding Coreg 3.125 mg BID. He'll need close follow up as an OP.        For questions or updates, please contact [facility] Please consult [email or web address] for contact info under   Signed, Corine Shelter, PA-C  08/05/2018 3:03 PM   EP Attending  Patient seen and examined. Agree with the findings as noted above. The patient is a pleasant 48 yo man with a new diagnosis of acute on chronic systolic heart failure. He has both uncontrolled HTN and ETOH about as his etiology. No chest pain or family history of CAD. No family h/o CHF. He was noted on the echo to have biventricular failure. He admits to ETOH abuse as above. He has not taken any meds for control of his bp. His exam is notable for an S4 gallop. ECG shows NSR with QRS of 110. Trace edema. CXR reviewed A/P 1. Acute systolic heart failure - his symptoms are improved. Agree with medical therapy for now. I strongly encouraged him to take his meds and to  stop drinking ETOH completely 2. ETOH abuse - he does abuse ETOH. He likes drinking vodka. I have asked him to stop drinking altogether. 3. HTN - his bp has not been well controlled. Hopefully with the initiation of his meds  his bp will improve. 4. CAD - he does not have angina and there is no family history. I would suggest an outpatient CT scan.  Leonia ReevesGregg Taylor,M.D.

## 2018-08-05 NOTE — Progress Notes (Signed)
  Echocardiogram 2D Echocardiogram with definity has been performed.  Leta Jungling M 08/05/2018, 10:57 AM

## 2018-08-06 LAB — CBC WITH DIFFERENTIAL/PLATELET
ABS IMMATURE GRANULOCYTES: 0 10*3/uL (ref 0.00–0.07)
Basophils Absolute: 0.1 10*3/uL (ref 0.0–0.1)
Basophils Relative: 1 %
Eosinophils Absolute: 0.1 10*3/uL (ref 0.0–0.5)
Eosinophils Relative: 2 %
HCT: 39.4 % (ref 39.0–52.0)
Hemoglobin: 12.5 g/dL — ABNORMAL LOW (ref 13.0–17.0)
Immature Granulocytes: 0 %
LYMPHS ABS: 2.1 10*3/uL (ref 0.7–4.0)
Lymphocytes Relative: 43 %
MCH: 30.3 pg (ref 26.0–34.0)
MCHC: 31.7 g/dL (ref 30.0–36.0)
MCV: 95.4 fL (ref 80.0–100.0)
MONOS PCT: 16 %
Monocytes Absolute: 0.8 10*3/uL (ref 0.1–1.0)
Neutro Abs: 1.8 10*3/uL (ref 1.7–7.7)
Neutrophils Relative %: 38 %
Platelets: 163 10*3/uL (ref 150–400)
RBC: 4.13 MIL/uL — ABNORMAL LOW (ref 4.22–5.81)
RDW: 13.2 % (ref 11.5–15.5)
WBC: 4.8 10*3/uL (ref 4.0–10.5)
nRBC: 0 % (ref 0.0–0.2)

## 2018-08-06 LAB — BASIC METABOLIC PANEL
Anion gap: 10 (ref 5–15)
BUN: 20 mg/dL (ref 6–20)
CHLORIDE: 99 mmol/L (ref 98–111)
CO2: 28 mmol/L (ref 22–32)
Calcium: 9.2 mg/dL (ref 8.9–10.3)
Creatinine, Ser: 1.29 mg/dL — ABNORMAL HIGH (ref 0.61–1.24)
GFR calc Af Amer: >60 mL/min (ref >60)
GFR calc non Af Amer: >60 mL/min (ref >60)
Glucose, Bld: 100 mg/dL — ABNORMAL HIGH (ref 70–99)
Potassium: 3.8 mmol/L (ref 3.5–5.1)
Sodium: 137 mmol/L (ref 135–145)

## 2018-08-06 MED ORDER — LOSARTAN POTASSIUM 25 MG PO TABS
25.0000 mg | ORAL_TABLET | Freq: Every day | ORAL | Status: AC
  Administered 2018-08-06: 25 mg via ORAL
  Filled 2018-08-06: qty 1

## 2018-08-06 MED ORDER — FUROSEMIDE 40 MG PO TABS
40.0000 mg | ORAL_TABLET | Freq: Every day | ORAL | Status: DC
  Administered 2018-08-06 – 2018-08-07 (×2): 40 mg via ORAL
  Filled 2018-08-06 (×2): qty 1

## 2018-08-06 MED ORDER — LOSARTAN POTASSIUM 50 MG PO TABS: 50.0000 mg | ORAL_TABLET | Freq: Every day | ORAL | Status: DC

## 2018-08-06 NOTE — Progress Notes (Signed)
PT Cancellation Note  Patient Details Name: Bryan Guerrero MRN: [id number] DOB: [birth year]   Cancelled Treatment:     New PT eval received, chart reviewed. It appears pt was already evaluated by PT with no further acute PT needs identified. Please see evaluation from 08/05/2018 for further details. Will sign off at this time.   Marylynn Pearson 08/06/2018, 6:58 AM  Conni Slipper, PT, DPT Acute Rehabilitation Services Pager: [phone] Office: [phone]

## 2018-08-06 NOTE — Progress Notes (Addendum)
Occupational Therapy Evaluation Patient Details Name: Bryan Guerrero MRN: [id number] DOB: [birth year] Today's Date: 08/06/2018    History of Present Illness Bryan Guerrero is a 48 y.o. male with a past medical history significant for alcohol and tobacco abuse who presents today complaining of worsening shortness of breath for the past 2 weeks in the setting of new CHF diagnosis. Pt with cardiomyopathy.    Clinical Impression   PTA, pt independent with ADL and mobility and worked at the [employer]. Pt able to ambulate and complete ADL tasks without SOB with SpO2@ 96. Pt endorses drinking daily and is open to discussing information regarding available resources to help with his drinking. MD notified regarding recommendation for SW consult for alcohol counseling. OT signing off.    Follow Up Recommendations  No OT follow up    Equipment Recommendations       Recommendations for Other Services Other (comment)(social work consult for resources regarding alcohol cessation)     Precautions / Restrictions Precautions Precautions: None      Mobility Bed Mobility Overal bed mobility: Independent                Transfers Overall transfer level: Independent                    Balance Overall balance assessment: Independent                                         ADL either performed or assessed with clinical judgement   ADL Overall ADL's : At baseline                                       General ADL Comments: no SOB with ADL tasks     Vision         Perception     Praxis      Pertinent Vitals/Pain Pain Assessment: No/denies pain     Hand Dominance Right   Extremity/Trunk Assessment Upper Extremity Assessment Upper Extremity Assessment: Overall WFL for tasks assessed   Lower Extremity Assessment Lower Extremity Assessment: Overall WFL for tasks assessed   Cervical / Trunk Assessment Cervical / Trunk  Assessment: Normal   Communication Communication Communication: No difficulties   Cognition Arousal/Alertness: Awake/alert Behavior During Therapy: WFL for tasks assessed/performed Overall Cognitive Status: Within Functional Limits for tasks assessed                                     General Comments  Pt discussed drinking daily. recommend pt seek counseling and social support to help address his alchol consumption. Pt agreed.    Exercises     Shoulder Instructions      Home Living Family/patient expects to be discharged to:: Private residence Living Arrangements: Spouse/significant other Available Help at Discharge: Family;Available 24 hours/day Type of Home: Apartment Home Access: Stairs to enter [employer] of Steps: 2 flights Entrance Stairs-Rails: Can reach both Home Layout: One level     Bathroom Shower/Tub: Chief Strategy Officer: Standard Bathroom Accessibility: Yes How Accessible: Accessible via walker Home Equipment: None          Prior Functioning/Environment Level of Independence: Independent  Comments: Works at [employer], [employer] Problem List: Decreased activity tolerance      OT Treatment/Interventions:      OT Goals(Current goals can be found in the care plan section) Acute Rehab OT Goals Patient Stated Goal: to go home OT Goal Formulation: All assessment and education complete, DC therapy  OT Frequency:     Barriers to D/C:            Co-evaluation              AM-PAC OT "6 Clicks" Daily Activity     Outcome Measure Help from another person eating meals?: None Help from another person taking care of personal grooming?: None Help from another person toileting, which includes using toliet, bedpan, or urinal?: None Help from another person bathing (including washing, rinsing, drying)?: None Help from another person to put on and taking off regular upper body clothing?:  None Help from another person to put on and taking off regular lower body clothing?: None 6 Click Score: 24   End of Session Nurse Communication: Mobility status  Activity Tolerance: Patient tolerated treatment well Patient left: in bed;with call bell/phone within reach;with family/visitor present  OT Visit Diagnosis: Muscle weakness (generalized) (M62.81)                Time: 2330-0762 OT Time Calculation (min): 12 min Charges:  OT General Charges $OT Visit: 1 Visit OT Evaluation $OT Eval Low Complexity: 1 Low  Luisa Dago, OT/L   Acute OT Clinical Specialist Acute Rehabilitation Services Pager [phone] Office [phone]   [facility] 08/06/2018, 3:01 PM

## 2018-08-06 NOTE — Discharge Summary (Addendum)
Family Medicine [facility] Discharge Summary  Patient name: Bryan Guerrero Medical record number: [id number] Date of birth: 1971/04/19 Age: 48 y.o. Gender: male Date of Admission: 08/04/2018  Date of Discharge: 08/07/2018 Admitting Physician: Carney Living, MD  Primary Care Provider: Patient, No Pcp Per Consultants: Cardiology, HF  Indication for Hospitalization: new CHF diagnosis  Discharge Diagnoses/Problem List:  Dyspnea CHF Hypertension Anemia Alcohol use disorder Tobacco use disorder   Disposition: home  Discharge Condition: stable, back to baseline without SOB or edema  Discharge Exam:  General: Alert, NAD,sitting upcomfortably in bed HEENT: NCAT, MMM Cardiac: RRR no m/g/r Lungs: Clear bilaterally, no increased WOB,conversing normally Abdomen: soft, non-tender, non-distended, normoactive BS Msk: Moves all extremities spontaneously  Ext:No peripheral edema  Brief Hospital Course:  Bryan Guerrero is a 48 yo male with a history of significant alcohol and tobacco consumption who was admitted with a new diagnosis of CHF. Echo with EF 15-20%, LV diffuse hypokinesis suggestive of nonischemic cardiomyopathy. CXR with cardiomegaly and evidence of COPD. EKG with evidence of LVH.  He was diuresed successfully with Lasix 40 mg IV. He was monitored on telemetry throughout his stay. Troponins were trended until negative. He was started on carvedilol, losartan and PO lasix. He was placed on CIWA protocol with CIWA scores of 0 throughout admission. On day of discharge, patient was back to baseline without SOB or edema.  Issues for Follow Up:  1. Adjust Lasix as needed, consider starting on spironolactone 2.   Cardiology appointment needed 3.   Consider outpatient coronary CT for further ischemic evaluation. 3.   CXR concerning for COPD. Consider PFTs. 4.   Encourage alcohol cessation.   Significant Procedures: None  Significant Labs and Imaging:  Recent Labs   Lab 08/04/18 1611 08/04/18 2354 08/06/18 0315  WBC 4.4 6.2 4.8  HGB 12.3* 13.0 12.5*  HCT 40.2 40.8 39.4  PLT 150 162 163   Recent Labs  Lab 08/04/18 1611 08/04/18 2354 08/05/18 1128 08/06/18 0315  NA 139  --  141 137  K 4.4  --  3.8 3.8  CL 107  --  107 99  CO2 23  --  22 28  GLUCOSE 122*  --  92 100*  BUN 10  --  13 20  CREATININE 1.19 1.29* 1.17 1.29*  CALCIUM 9.3  --  9.3 9.2  ALKPHOS 60  --   --   --   AST 45*  --   --   --   ALT 46*  --   --   --   ALBUMIN 3.8  --   --   --       Results/Tests Pending at Time of Discharge: N/A  Discharge Medications:  Allergies as of 08/07/2018   No Known Allergies     Medication List    STOP taking these medications   ibuprofen 200 MG tablet Commonly known as:  ADVIL,MOTRIN     TAKE these medications   carvedilol 3.125 MG tablet Commonly known as:  COREG Take 1 tablet (3.125 mg total) by mouth 2 (two) times daily with a meal.   furosemide 40 MG tablet Commonly known as:  LASIX Take 1 tablet (40 mg total) by mouth daily. Start taking on:  August 08, 2018   losartan 25 MG tablet Commonly known as:  COZAAR Take 1 tablet (25 mg total) by mouth daily. Start taking on:  August 08, 2018   multivitamin with minerals Tabs tablet Take 1 tablet by mouth  daily.   PRIMATENE MIST 0.125 MG/ACT Aero Generic drug:  EPINEPHrine Inhale 1 puff into the lungs 3 (three) times daily as needed (shortness od breath).       Discharge Instructions: Please refer to Patient Instructions section of EMR for full details.  Patient was counseled important signs and symptoms that should prompt return to medical care, changes in medications, dietary instructions, activity restrictions, and follow up appointments.   Follow-Up Appointments: Follow-up Information    [facility] Follow up on 08/15/2018.   Why:  3:50 for hospital follow up Contact information: [address] [place]  [id number] [phone]          Lovena Neighbours, MD 08/07/2018, 9:07 AM [facility]

## 2018-08-06 NOTE — Progress Notes (Addendum)
Family Medicine Teaching Service Daily Progress Note Intern Pager: [phone]  Patient name: Bryan Guerrero Medical record number: [id number] Date of birth: 1971/02/17 Age: 48 y.o. Gender: male  Primary Care Provider: Patient, No Pcp Per Consultants: None Code Status: Full  Pt Overview and Major Events to Date:  Admitted 2/22  Assessment and Plan: Bryan Guerrero is a 48 y.o. male with a past medical history significant for alcohol and tobacco abuse who presents today complaining of worsening shortness of breath for the past 2 weeks in the setting of new CHF diagnosis.  #Dyspnea likely 2/2 new onset CHF: Acute, improving. Patient denies dyspnea or LE edema and appears to be back to baseline. Lungs clear and no LE edema on exam. Echo with EF 15-20% and diffuse LV hypokinesis suggestive of nonischemic cardiomyopathy. Down -6.7L since admission. Has had appropriate output with diuresis with Lasix 40 mg IV. Unknown dry weight. New onset CHF likely in the setting of chronic uncontrolled hypertension and  significant alcohol use. -- Consult cardiology, appreciate recs - Transition to Lasix 40 mg PO - Monitor CBC and BMP - Strict I&O's, daily weights - Increase losartan to 50 mg qd given high BPs, Will reevaluate in the tomorrow am - Continue carvedilol 3.125 mg bid - Outpatient coronary CT for further ischemic evaluation, Will establish care with Cardiology - Consider starting spironolactone as outpatient  #Elevated blood pressure: Likely chronic, uncontrolled.  Consistently elevated since admission, SBP 140-160's and DBP 90-130s.  With evidence of LVH on EKG, suspect this is been chronically uncontrolled. - Losartan and carvedilol as above - Monitor BP  #Alcohol abuse: Chronic Patient reports drinking about half 1/5 of liquor every night.  Voices motivation to cut back, has experienced withdrawal symptoms before. -- CIWA protocol, 0 thus far  -- Cont folic acid and thiamine  --  Consult to SW for alcohol cessation counseling   #Tobacco use: Patient reports smoking about 2 to 3 cigars every day.  Previous cigarette smoker.  Hyperinflation of lungs suggesting COPD on chest x-ray at admission. - Suggest obtaining PFTs outpatient  #Anemia, mild: Resolved. Hemoglobin 13, normocytic and normochromic.  Ferritin wnl.  Likely was dilutional, however will continue to monitor.   - Monitor CBC  FEN/GI: Heart healthy diet Prophylaxis: Lovenox  Disposition:  Continued inpatient care, medically stable anticipate discharge on 2/25  Subjective:  Bryan Guerrero reports no issues overnight. He states he has had no issues breathing, no CP or swelling. He feels like he is back to his normal self and is ready to go home. No complaints.  Objective: Temp:  [98 F (36.7 C)-98.6 F (37 C)] 98 F (36.7 C) (02/24 0738) Pulse Rate:  [101-103] 101 (02/24 0738) Resp:  [16-20] 20 (02/24 0738) BP: (124-138)/(87-105) 138/99 (02/24 0738) SpO2:  [98 %-99 %] 99 % (02/24 0738) Weight:  [88 kg] 88 kg (02/24 0500)   Physical Exam: General: Alert, NAD,sitting up comfortably in bed HEENT: NCAT, MMM Cardiac: RRR no m/g/r Lungs: Clear bilaterally, no increased WOB,conversing normally Abdomen: soft, non-tender, non-distended, normoactive BS Msk: Moves all extremities spontaneously  Ext: No peripheral edema  Laboratory: Recent Labs  Lab 08/04/18 1611 08/04/18 2354 08/06/18 0315  WBC 4.4 6.2 4.8  HGB 12.3* 13.0 12.5*  HCT 40.2 40.8 39.4  PLT 150 162 163   Recent Labs  Lab 08/04/18 1611 08/04/18 2354 08/05/18 1128 08/06/18 0315  NA 139  --  141 137  K 4.4  --  3.8 3.8  CL 107  --  107 99  CO2 23  --  22 28  BUN 10  --  13 20  CREATININE 1.19 1.29* 1.17 1.29*  CALCIUM 9.3  --  9.3 9.2  PROT 6.7  --   --   --   BILITOT 1.1  --   --   --   ALKPHOS 60  --   --   --   ALT 46*  --   --   --   AST 45*  --   --   --   GLUCOSE 122*  --  92 100*     Imaging/Diagnostic  Tests: No results found.  I have seen and evaluated the patient with Medical Student minish. I am in agreement with the note above in its revised form. My additions are in blue.  Lovena Neighbours, MD Family Medicine, PGY-3   Cathleen Corti, Medical Student 08/06/2018, 3:22 PM [facility] FPTS Intern pager: [phone], text pages welcome

## 2018-08-06 NOTE — Progress Notes (Addendum)
Progress Note  Patient Name: Bryan Guerrero Date of Encounter: 08/06/2018  Primary Cardiologist: New Consult (Dr. Royann Shivers)   Subjective   No significant overnight events. Shortness of breath has improved and he was able to lay flat last night. No chest pain. Patient has no complaints at this time.  Inpatient Medications    Scheduled Meds: . carvedilol  3.125 mg Oral BID WC  . enoxaparin (LOVENOX) injection  40 mg Subcutaneous Q24H  . folic acid  1 mg Oral Daily  . losartan  25 mg Oral Daily  . [START ON 08/07/2018] losartan  50 mg Oral Daily  . multivitamin with minerals  1 tablet Oral Daily  . thiamine  100 mg Oral Daily   Or  . thiamine  100 mg Intravenous Daily   Continuous Infusions:  PRN Meds: hydrALAZINE, LORazepam **OR** LORazepam   Vital Signs    Vitals:   08/05/18 1545 08/05/18 2340 08/06/18 0500 08/06/18 0738  BP: 124/87 (!) 133/105  (!) 138/99  Pulse: (!) 103 (!) 102  (!) 101  Resp: 16 18  20   Temp: 98.5 F (36.9 C) 98.6 F (37 C)  98 F (36.7 C)  TempSrc: Oral Oral  Oral  SpO2: 98% 98%  99%  Weight:   88 kg   Height:        Intake/Output Summary (Last 24 hours) at 08/06/2018 1021 Last data filed at 08/06/2018 9191 Gross per 24 hour  Intake 480 ml  Output 3075 ml  Net -2595 ml   Last 3 Weights 08/06/2018 08/04/2018 08/04/2018  Weight (lbs) 194 lb 0.1 oz 197 lb 12 oz 195 lb  Weight (kg) 88 kg 89.7 kg 88.451 kg      Telemetry    Sinus rhythm with heart rates in the 70's to low 100's. 3 beats of non-sustained VT also noted. - Personally Reviewed  ECG    No new ECG tracing today. - Personally Reviewed  Physical Exam   GEN: African-American male resting comfortably. Alert and in no acute distress.   Neck: Supple. No JVD appreciated. Cardiac: RRR. Gallop noted. No murmurs or rubs.  Respiratory: No increased work of breathing. Diminished breath sounds in bilateral bases but lungs sound clear bilaterally.  GI: Soft, non-tender, non-distended.  Bowel sounds present. MS: No lower extremity edema.No deformity. Skin: Warm and dry. Neuro:  No focal deficits. Psych: Normal affect. Responds appropriately.   Labs    Chemistry Recent Labs  Lab 08/04/18 1611 08/04/18 2354 08/05/18 1128 08/06/18 0315  NA 139  --  141 137  K 4.4  --  3.8 3.8  CL 107  --  107 99  CO2 23  --  22 28  GLUCOSE 122*  --  92 100*  BUN 10  --  13 20  CREATININE 1.19 1.29* 1.17 1.29*  CALCIUM 9.3  --  9.3 9.2  PROT 6.7  --   --   --   ALBUMIN 3.8  --   --   --   AST 45*  --   --   --   ALT 46*  --   --   --   ALKPHOS 60  --   --   --   BILITOT 1.1  --   --   --   GFRNONAA >60 >60 >60 >60  GFRAA >60 >60 >60 >60  ANIONGAP 9  --  12 10     Hematology Recent Labs  Lab 08/04/18 1611 08/04/18 2354 08/06/18 0315  WBC 4.4 6.2 4.8  RBC 4.10* 4.32 4.13*  HGB 12.3* 13.0 12.5*  HCT 40.2 40.8 39.4  MCV 98.0 94.4 95.4  MCH 30.0 30.1 30.3  MCHC 30.6 31.9 31.7  RDW 13.2 13.0 13.2  PLT 150 162 163    Cardiac Enzymes Recent Labs  Lab 08/04/18 1611 08/04/18 2354 08/05/18 0620 08/05/18 1128  TROPONINI 0.03* 0.04* 0.04* 0.03*   No results for input(s): TROPIPOC in the last 168 hours.   BNP Recent Labs  Lab 08/04/18 1611  BNP 1,754.9*     DDimer No results for input(s): DDIMER in the last 168 hours.   Radiology    Dg Chest 2 View  Result Date: 08/04/2018 CLINICAL DATA:  Shortness of breath, left side chest pain EXAM: CHEST - 2 VIEW COMPARISON:  None. FINDINGS: Heart is enlarged. Hyperinflation of the lungs. Peribronchial thickening. No confluent opacity or effusion. No acute bony abnormality. IMPRESSION: Cardiomegaly. COPD.  Bronchitic changes. Electronically Signed   By: Charlett Nose M.D.   On: 08/04/2018 17:14   Vas [place] Lower Extremity Venous (dvt) (mc And Wl 7a-7p)  Result Date: 08/04/2018  Lower Venous Study Indications: Edema.  Comparison Study: No prior study on file Performing Technologist: Sherren Kerns RVS  Examination  Guidelines: A complete evaluation includes B-mode imaging, spectral Doppler, color Doppler, and power Doppler as needed of all accessible portions of each vessel. Bilateral testing is considered an integral part of a complete examination. Limited examinations for reoccurring indications may be performed as noted.  Right Venous Findings: +---+---------------+---------+-----------+----------+---------+    CompressibilityPhasicitySpontaneityPropertiesSummary   +---+---------------+---------+-----------+----------+---------+ CFVFull                                         pulsatile +---+---------------+---------+-----------+----------+---------+  Left Venous Findings: +---------+---------------+---------+-----------+----------+---------+          CompressibilityPhasicitySpontaneityPropertiesSummary   +---------+---------------+---------+-----------+----------+---------+ CFV      Full                                         pulsatile +---------+---------------+---------+-----------+----------+---------+ SFJ      Full                                                   +---------+---------------+---------+-----------+----------+---------+ FV Prox  Full                                         pulsatile +---------+---------------+---------+-----------+----------+---------+ FV Mid   Full                                                   +---------+---------------+---------+-----------+----------+---------+ FV DistalFull                                                   +---------+---------------+---------+-----------+----------+---------+ PFV  Full                                                   +---------+---------------+---------+-----------+----------+---------+ POP      Full                                         pulsatile +---------+---------------+---------+-----------+----------+---------+ PTV      Full                                                    +---------+---------------+---------+-----------+----------+---------+ PERO     Full                                                   +---------+---------------+---------+-----------+----------+---------+    Summary: Right: No evidence of common femoral vein obstruction. Left: There is no evidence of deep vein thrombosis in the lower extremity. Waveforms are pulsatile throughout, suggestive of fluid overload Ultrasound characteristics of enlarged lymph nodes noted in the groin. Interstitial fluid noted throughout the left calf.  *See table(s) above for measurements and observations. Electronically signed by Waverly Ferrari MD on 08/04/2018 at 6:06:08 PM.    Final     Cardiac Studies   Echocardiogram 08/06/2018: Impression:  1. The left ventricle has severely reduced systolic function, with an ejection fraction of 15-20%. The cavity size was moderately dilated. Left ventricular diastolic Doppler parameters are consistent with pseudonormalization. Left ventricular diffuse  hypokinesis with septal-lateral dyssynergy suggestive of nonischemic cardiomyopathy. No obvious LV mural thrombus visualized with Definity contrast.  2. The right ventricle has moderately reduced systolic function. The cavity was normal. There is no increase in right ventricular wall thickness. Right ventricular systolic pressure could not be assessed.  3. Left atrial size was moderately dilated.  4. Small pericardial effusion.  5. The pericardial effusion is posterior to the left ventricle.  6. The mitral valve is normal in structure.  7. The tricuspid valve is normal in structure.  8. The aortic valve is tricuspid.  9. The aortic root is normal in size and structure.  Patient Profile   Mr. Gines is a 48 y.o. male with a history of hypertension and alcohol abuse who is being seen for evaluation of acute CHF at the request of Dr. Deirdre Priest.   Assessment & Plan    Acute Systolic CHF - Echo this  admission showed LVEF of 15-20% with diffuse hypokinesis and septal-lateral dyssynergy suggestive of non-ischemic cardiomyopathy (likely due to alcohol use and uncontrolled hypertension. - Diuresing well on IV Lasix. Documented output of 3 L in the past 24 hours and net negative 6.75 L since admission.  Serum creatinine slightly elevated at 1.29 but stable. - May be able to transition to PO Lasix today.  - Continue Losartan 25mg  daily. - Continue Coreg 3.125mg  twice daily. May consider increasing given 3 beats of non-sustained VT on telemetry. - Continue to monitor daily weights, strict I/O's, and renal function. - Patient denies any angina and has  no family history of CAD. However given newly reduced EF, consider outpatient coronary CT for further ischemic evaluation.  Hypertension - Most recent BP 138/99. - Continue Losartan as above. - Continue Coreg as above.  Alcohol Abuse - Patient reports drinking about 1/5 of liquor every night.  - Emphasized the important of complete alcohol cessation given non-ischemic cardiomyopathy.   Tobacco Use - Complete cessation is advised.   Otherwise, per primary team.  For questions or updates, please contact [facility] Please consult [email or web address] for contact info under        Signed, Corrin Parker, PA-C  08/06/2018, 10:21 AM     I have seen and examined the patient along with Corrin Parker, PA-C .  I have reviewed the chart, notes and new data.  I agree with PA's note.  Key new complaints: Denies any dyspnea at rest and does not have orthopnea.  Edema has resolved completely. Key examination changes: He does not have jugular venous distention or leg edema but continues to have a S3 gallop. Key new findings / data: Echo reviewed.  He has severely depressed left ventricular systolic function with EF of 10-15% and global hypokinesis.  No significant valvular abnormalities are present.  PLAN: The impression is of nonischemic  cardiomyopathy which may be due to either untreated hypertension, alcoholic cardiomyopathy, idiopathic dilated cardiomyopathy or a combination of the above.  Discussed the need for sodium restriction, daily weight monitoring, regular cardiology follow-up, use of RAAS inhibitor and beta-blockers in the maximum tolerated dose.  Abstinence from alcohol was discussed in particular. Need social work evaluation to set up for the [employer] and wellness clinic.  Thurmon Fair, MD, [facility] [phone] 08/06/2018, 12:04 PM

## 2018-08-07 MED ORDER — LOSARTAN POTASSIUM 25 MG PO TABS
25.0000 mg | ORAL_TABLET | Freq: Every day | ORAL | Status: DC
  Administered 2018-08-07: 25 mg via ORAL
  Filled 2018-08-07: qty 1

## 2018-08-07 MED ORDER — CARVEDILOL 3.125 MG PO TABS: 3.1250 mg | ORAL_TABLET | Freq: Two times a day (BID) | ORAL | 2 refills | Status: DC

## 2018-08-07 MED ORDER — LOSARTAN POTASSIUM 25 MG PO TABS: 25.0000 mg | ORAL_TABLET | Freq: Every day | ORAL | 2 refills | Status: DC

## 2018-08-07 MED ORDER — FUROSEMIDE 40 MG PO TABS: 40.0000 mg | ORAL_TABLET | Freq: Every day | ORAL | 1 refills | Status: DC

## 2018-08-07 NOTE — Progress Notes (Signed)
CSW contacted by [facility] that patient was being discharged and had not yet been provided alcohol resources. CSW gave resources to [facility] who provided them to patient as he was leaving.  CSW signing off.  Blenda Nicely, [place] Clinical Social Worker [phone]

## 2018-08-07 NOTE — Care Management Note (Signed)
Case Management Note  Patient Details  Name: Bryan Guerrero MRN: [id number] Date of Birth: [birth year]  Subjective/Objective:  From home, for dc today, will need ast with Medications, NCM also scheduled follow up apt with [facility] clinic, NCM gave patient the brochure, CSW gave patient resources for substanc abuse.  NCM assisted with medication assist with Match Letter.                   Action/Plan: DC home when ready.   Expected Discharge Date:  08/07/18               Expected Discharge Plan:  Home/Self Care  In-House Referral:  Clinical Social Work  Discharge planning Services  CM Consult, MATCH Program, [facility], Medication Assistance, Follow-up appt scheduled  Post Acute Care Choice:    Choice offered to:     DME Arranged:    DME Agency:     HH Arranged:    HH Agency:     Status of Service:  Completed, signed off  If discussed at [employer], dates discussed:    Additional Comments:  Leone Haven, RN 08/07/2018, 9:55 AM

## 2018-08-14 ENCOUNTER — Other Ambulatory Visit: Payer: Self-pay

## 2018-08-14 ENCOUNTER — Ambulatory Visit (INDEPENDENT_AMBULATORY_CARE_PROVIDER_SITE_OTHER): Payer: Self-pay | Admitting: Family Medicine

## 2018-08-14 VITALS — BP 110/90 | HR 105 | Temp 98.8°F | Wt 199.2 lb

## 2018-08-14 DIAGNOSIS — I5022 Chronic systolic (congestive) heart failure: Secondary | ICD-10-CM

## 2018-08-14 NOTE — Assessment & Plan Note (Addendum)
Patient presents today to follow-up on recent hospitalization for new diagnosis of systolic heart failure with an EF on echo of 15 to 20%.  Since discharge patient has been taking losartan 25 mg daily furosemide 40 mg daily and carvedilol 3.25 mg 2 times a day without any issues.  Extremity swelling has completely resolved.  Lung exam is clear to auscultation bilaterally no crackles wheezing.  Patient continues to smoke but has stopped drinking alcohol since discharge from hospital.  Chest x-ray in the hospital was suspicious for COPD.  Patient will likely need PFTs but will have to establish care facilities clinic.  Refer him to front office for help with orange card versus confidential assistance.  He will make an appointment to see me in 1 month.  In the meantime he will follow-up with cardiology for further management of his newly diagnosed CHF.  Currently he has mentioned possible coronary CTA in the outpatient setting he will follow-up with them in regards to that. --Continue current regimen --We will order BMP today to check electrolytes given recent initiation of carbs --Follow-up with me in a month.

## 2018-08-14 NOTE — Progress Notes (Signed)
   Subjective:    Patient ID: Bryan Guerrero, male    DOB: 1970-12-13, 48 y.o.   MRN: [id number]   CC: Hospital follow-up new diagnosis CHF  HPI: Patient is a 48 year old male who presents today after recent hospitalization for shortness of breath in the setting of new diagnosis for CHF.  Patient reports that since discharge he has been doing well.  He denies any shortness of breath, lower extremity swelling, chest pain, abdominal pain.  He has been taking his Lasix, losartan, and carvedilol as prescribed.  Patient is returning to work Advertising account executive.  Reports that he has not had any alcohol since his discharge from hospital.  He seems motivated to quit after having so many health issues for the past few months in relation to his alcohol consumption.  Patient does not have insurance and would like to discuss steps that needs to be taken for him to be established in this clinic.  He has a follow-up with cardiology on 3/13.  He was also supposed to go telemetry health and wellness but discussed that since he is being seen today he probably will not follow-up with them.  Smoking status reviewed   ROS: all other systems were reviewed and are negative other than in the HPI   Past Medical History:  Diagnosis Date  . Asthma     History reviewed. No pertinent surgical history.  Past medical history, surgical, family, and social history reviewed and updated in the EMR as appropriate.  Objective:  BP 110/90   Pulse (!) 105   Temp 98.8 F (37.1 C) (Oral)   Wt 199 lb 4 oz (90.4 kg)   SpO2 96%   BMI 27.02 kg/m   Vitals and nursing note reviewed  General: NAD, pleasant, able to participate in exam Cardiac: RRR, normal heart sounds, no murmurs. 2+ radial and PT pulses bilaterally Respiratory: CTAB, normal effort, No wheezes, rales or rhonchi Abdomen: soft, nontender, nondistended, no hepatic or splenomegaly, +BS Extremities: no edema or cyanosis. WWP. Skin: warm and dry, no rashes noted Neuro:  alert and oriented x4, no focal deficits Psych: Normal affect and mood   Assessment & Plan:   Chronic systolic congestive heart failure (HCC) Patient presents today to follow-up on recent hospitalization for new diagnosis of systolic heart failure with an EF on echo of 15 to 20%.  Since discharge patient has been taking losartan 25 mg daily furosemide 40 mg daily and carvedilol 3.25 mg 2 times a day without any issues.  Extremity swelling has completely resolved.  Lung exam is clear to auscultation bilaterally no crackles wheezing.  Patient continues to smoke but has stopped drinking alcohol since discharge from hospital.  Chest x-ray in the hospital was suspicious for COPD.  Patient will likely need PFTs but will have to establish care facilities clinic.  Refer him to front office for help with orange card versus confidential assistance.  He will make an appointment to see me in 1 month.  In the meantime he will follow-up with cardiology for further management of his newly diagnosed CHF.  Currently he has mentioned possible coronary CTA in the outpatient setting he will follow-up with them in regards to that. --Continue current regimen --We will order BMP today to check electrolytes given recent initiation of carbs --Follow-up with me in a month.    Lovena Neighbours, MD [facility] PGY-3

## 2018-08-14 NOTE — Patient Instructions (Signed)
It was great seeing you today! We have addressed the following issues today  1. I want you to Follow up with Cardiology on 3/13 as scheduled. 2. Continue taking your medication as prescribed. I will do some blood work today and will follow up on the results. 3. Make sure you make an appointment to see me in early April. 4. Talk to front desk for [employer] or [facility] [facility] assistance.  If we did any lab work today, and the results require attention, either me or my nurse will get in touch with you. If everything is normal, you will get a letter in mail and a message via . If you don't hear from [place] in two weeks, please give [place] a call. Otherwise, we look forward to seeing you again at your next visit. If you have any questions or concerns before then, please call the clinic at [phone].  Please bring all your medications to every doctors visit  Sign up for My Chart to have easy access to your labs results, and communication with your Primary care physician. Please ask Front Desk for some assistance.   Please check-out at the front desk before leaving the clinic.    Take Care,   Dr. Sydnee Cabal

## 2018-08-15 ENCOUNTER — Inpatient Hospital Stay: Payer: Self-pay

## 2018-08-15 LAB — BASIC METABOLIC PANEL
BUN/Creatinine Ratio: 9 (ref 9–20)
BUN: 11 mg/dL (ref 6–24)
CO2: 24 mmol/L (ref 20–29)
Calcium: 9.6 mg/dL (ref 8.7–10.2)
Chloride: 99 mmol/L (ref 96–106)
Creatinine, Ser: 1.2 mg/dL (ref 0.76–1.27)
GFR calc non Af Amer: 73 mL/min/{1.73_m2} (ref >59)
GFR, EST AFRICAN AMERICAN: 84 mL/min/{1.73_m2} (ref >59)
Glucose: 97 mg/dL (ref 65–99)
Potassium: 4.9 mmol/L (ref 3.5–5.2)
SODIUM: 136 mmol/L (ref 134–144)

## 2018-08-15 NOTE — Progress Notes (Deleted)
Patient ID: [name], male   DOB: 04-16-1971, 48 y.o.   MRN: [id number]  After recent hospitalization 2/22-2/25/20 for CHF.     From d/c summary: Brief Hospital Course:  Mr. Lashlee is a 48 yo male with a history of significant alcohol and tobacco consumption who was admitted with a new diagnosis of CHF. Echo with EF 15-20%, LV diffuse hypokinesis suggestive of nonischemic cardiomyopathy. CXR with cardiomegaly and evidence of COPD. EKG with evidence of LVH.  He was diuresed successfully with Lasix 40 mg IV. He was monitored on telemetry throughout his stay. Troponins were trended until negative. He was started on carvedilol, losartan and PO lasix. He was placed on CIWA protocol with CIWA scores of 0 throughout admission. On day of discharge, patient was back to baseline without SOB or edema.  Issues for Follow Up:  1. Adjust Lasix as needed, consider starting on spironolactone 2.   Cardiology appointment needed 3.   Consider outpatient coronary CT for further ischemic evaluation. 3.   CXR concerning for COPD. Consider PFTs. 4.   Encourage alcohol cessation.

## 2018-08-23 NOTE — Progress Notes (Deleted)
Cardiology Office Note   Date:  08/23/2018   ID:  Bryan Guerrero, DOB Mar 26, 1971, MRN [id number]  PCP:  Lovena Neighbours, MD  Cardiologist:  Georgie Chard, NP   No chief complaint on file.     History of Present Illness: Bryan Guerrero is a 48 y.o. male who presents for post hospital follow up, seen for    Bryan Guerrero has a prior hx of HTN and ETOH abuse. He was seen in consultation during a recent hospitalization for cardiomyopathy. He reported having no prior history of any cardiac issues. It was reported that he has untreated hypertension and drinks 1/5 of a gallon of liquor a day. As an outpatient he had noted increasing dyspnea on exertion, orthopnea, and lower extremity edema for about one month but was never evaluated until seen in the ED. Unfortunately his echocardiogram showd an LVEF of 15 to 20% with global hypokinesis. He was started on Cozaar and Lasix with consideration for carvedilol 3.125  Acute Systolic CHF - Echo this admission showed LVEF of 15-20% with diffuse hypokinesis and septal-lateral dyssynergy suggestive of non-ischemic cardiomyopathy (likely due to alcohol use and uncontrolled hypertension. - Diuresing well on IV Lasix. Documented output of 3 L in the past 24 hours and net negative 6.75 L since admission.  Serum creatinine slightly elevated at 1.29 but stable. - May be able to transition to PO Lasix today.  - Continue Losartan 25mg  daily. - Continue Coreg 3.125mg  twice daily. May consider increasing given 3 beats of non-sustained VT on telemetry. - Continue to monitor daily weights, strict I/O's, and renal function. - Patient denies any angina and has no family history of CAD. However given newly reduced EF, consider outpatient coronary CT for further ischemic evaluation. Discussed the need for sodium restriction, daily weight monitoring, regular cardiology follow-up, use of RAAS inhibitor and beta-blockers in the maximum tolerated dose.  Abstinence  from alcohol was discussed in particular. Need social work evaluation to set up for the [employer] and wellness clinic.  Hypertension - Most recent BP 138/99. - Continue Losartan as above. - Continue Coreg as above.  Alcohol Abuse - Patient reports drinking about 1/5 of liquor every night.  - Emphasized the important of complete alcohol cessation given non-ischemic cardiomyopathy.   Tobacco Use - Complete cessation is advised.   Past Medical History:  Diagnosis Date  . Asthma     No past surgical history on file.   Current Outpatient Medications  Medication Sig Dispense Refill  . carvedilol (COREG) 3.125 MG tablet Take 1 tablet (3.125 mg total) by mouth 2 (two) times daily with a meal. 60 tablet 2  . EPINEPHrine (PRIMATENE MIST) 0.125 MG/ACT AERO Inhale 1 puff into the lungs 3 (three) times daily as needed (shortness od breath).    . furosemide (LASIX) 40 MG tablet Take 1 tablet (40 mg total) by mouth daily. 30 tablet 1  . losartan (COZAAR) 25 MG tablet Take 1 tablet (25 mg total) by mouth daily. 30 tablet 2  . Multiple Vitamin (MULTIVITAMIN WITH MINERALS) TABS tablet Take 1 tablet by mouth daily.     No current facility-administered medications for this visit.     Allergies:   Patient has no known allergies.    Social History:  The patient  reports that he quit smoking about 5 weeks ago. His smoking use included cigars. He quit after 15.00 years of use. He has never used smokeless tobacco. He reports current alcohol use of about  51.0 standard drinks of alcohol per week. He reports current drug use. Drug: Marijuana.   Family History:  The patient's family history is not on file.    ROS:  Please see the history of present illness. Otherwise, review of systems are positive for {NONE DEFAULTED:18576::"none"}. All other systems are reviewed and negative.    PHYSICAL EXAM: VS:  There were no vitals taken for this visit. , BMI There is no height or weight on file to  calculate BMI.    General: Well developed, well nourished, NAD Skin: Warm, dry, intact  Head: Normocephalic, atraumatic, sclera non-icteric, no xanthomas, clear, moist mucus membranes. Neck: Negative for carotid bruits. No JVD Lungs:Clear to ausculation bilaterally. No wheezes, rales, or rhonchi. Breathing is unlabored. Cardiovascular: RRR with S1 S2. No murmurs, rubs, gallops, or LV heave appreciated. Abdomen: Soft, non-tender, non-distended with normoactive bowel sounds. No hepatomegaly, No rebound/guarding. No obvious abdominal masses. MSK: Strength and tone appear normal for age. 5/5 in all extremities Extremities: No edema. No clubbing or cyanosis. DP/PT pulses 2+ bilaterally Neuro: Alert and oriented. No focal deficits. No facial asymmetry. MAE spontaneously. Psych: Responds to questions appropriately with normal affect.       EKG:  EKG {ACTION; IS/IS WUJ:[id number]} ordered today. The ekg ordered today demonstrates ***   Recent Labs: 08/04/2018: ALT 46; B Natriuretic Peptide 1,754.9 08/05/2018: TSH 1.616 08/06/2018: Hemoglobin 12.5; Platelets 163 08/14/2018: BUN 11; Creatinine, Ser 1.20; Potassium 4.9; Sodium 136    Lipid Panel No results found for: CHOL, TRIG, HDL, CHOLHDL, VLDL, LDLCALC, LDLDIRECT    Wt Readings from Last 3 Encounters:  08/14/18 199 lb 4 oz (90.4 kg)  08/07/18 194 lb 0.1 oz (88 kg)      Other studies Reviewed: Additional studies/ records that were reviewed today include:   Echo 08/05/2018  IMPRESSIONS  1. The left ventricle has severely reduced systolic function, with an ejection fraction of 15-20%. The cavity size was moderately dilated. Left ventricular diastolic Doppler parameters are consistent with pseudonormalization. Left ventricular diffuse  hypokinesis with septal-lateral dyssynergy suggestive of nonischemic cardiomyopathy. No obvious LV mural thrombus visualized with Definity contrast. 2. The right ventricle has moderately reduced  systolic function. The cavity was normal. There is no increase in right ventricular wall thickness. Right ventricular systolic pressure could not be assessed. 3. Left atrial size was moderately dilated. 4. Small pericardial effusion. 5. The pericardial effusion is posterior to the left ventricle. 6. The mitral valve is normal in structure. 7. The tricuspid valve is normal in structure. 8. The aortic valve is tricuspid. 9. The aortic root is normal in size and structure.  ASSESSMENT AND PLAN:  1.  ***   Current medicines are reviewed at length with the patient today.  The patient {ACTIONS; HAS/DOES NOT HAVE:19233} concerns regarding medicines.  The following changes have been made:  {PLAN; NO CHANGE:13088:s}  Labs/ tests ordered today include: *** No orders of the defined types were placed in this encounter.    Disposition:   FU with *** in {gen number 2-95:621308} {Days to years:10300}  Signed, Georgie Chard, NP  08/23/2018 11:14 AM    [facility] [address] [place], [place], [place]  [address] Phone: [phone]; Fax: [phone]

## 2018-08-24 ENCOUNTER — Ambulatory Visit: Payer: Self-pay | Admitting: Physician Assistant

## 2018-08-24 ENCOUNTER — Encounter: Payer: Self-pay | Admitting: Cardiology

## 2018-08-24 ENCOUNTER — Other Ambulatory Visit: Payer: Self-pay

## 2018-08-24 ENCOUNTER — Ambulatory Visit (INDEPENDENT_AMBULATORY_CARE_PROVIDER_SITE_OTHER): Payer: Self-pay | Admitting: Cardiology

## 2018-08-24 VITALS — BP 130/76 | HR 78 | Ht 72.0 in | Wt 195.2 lb

## 2018-08-24 DIAGNOSIS — Z720 Tobacco use: Secondary | ICD-10-CM

## 2018-08-24 DIAGNOSIS — I429 Cardiomyopathy, unspecified: Secondary | ICD-10-CM

## 2018-08-24 DIAGNOSIS — I426 Alcoholic cardiomyopathy: Secondary | ICD-10-CM

## 2018-08-24 DIAGNOSIS — I10 Essential (primary) hypertension: Secondary | ICD-10-CM

## 2018-08-24 DIAGNOSIS — Z01818 Encounter for other preprocedural examination: Secondary | ICD-10-CM

## 2018-08-24 DIAGNOSIS — F1010 Alcohol abuse, uncomplicated: Secondary | ICD-10-CM

## 2018-08-24 DIAGNOSIS — N289 Disorder of kidney and ureter, unspecified: Secondary | ICD-10-CM

## 2018-08-24 MED ORDER — CARVEDILOL 6.25 MG PO TABS: 6.2500 mg | ORAL_TABLET | Freq: Two times a day (BID) | ORAL | 0 refills | Status: DC

## 2018-08-24 MED ORDER — CARVEDILOL 3.125 MG PO TABS: 36.2500 mg | ORAL_TABLET | Freq: Two times a day (BID) | ORAL | 0 refills | Status: DC

## 2018-08-24 MED ORDER — METOPROLOL TARTRATE 50 MG PO TABS: ORAL_TABLET | ORAL | 0 refills | Status: DC

## 2018-08-24 NOTE — Progress Notes (Signed)
Cardiology Office Note   Date:  08/23/2018   ID:  Bryan Guerrero, DOB [birth year], MRN [id number]  PCP:  Bryan Neighbours, MD  Cardiologist: Dr. Thurmon Fair, MD   No chief complaint on file.   History of Present Illness: Bryan Guerrero is a 48 y.o. male who presents for post hospital follow up, seen for Dr. Royann Guerrero.   [name] has a prior hx of HTN and ETOH abuse. He was initially seen in consultation during a recent hospitalization for cardiomyopathy. He reported having no prior history of any cardiac issues. It was reported that he has untreated hypertension and drinks 1/5 of a gallon of liquor a day. As an outpatient he had noted increasing dyspnea on exertion, orthopnea, and lower extremity edema for about one month but was never evaluated until seen in the ED. CXR with cardiomegaly and evidence of COPD. EKG with evidence of LVH. He was diuresed successfully with Lasix 40 mg IV. Troponins were trended until negative. He was started on carvedilol, losartan and PO lasix. He was placed on CIWA protocol with CIWA scores of 0 throughout admission. On day of discharge, patient was back to baseline without SOB or edema. Unfortunately his echocardiogram showed an LVEF of 15 to 20% with global hypokinesis. He was discharged on 08/07/2018 with consideration of starting spironolactone given severely reduced LV function. Will need further ischemic workup.   Today he reports that he is feeling well. He states that his exertional SOB and orthopnea symptoms are gone. He reports that he is able to walk up the flight of stairs at his apartment without becoming SOB. He states that he continues to work without complication. He continues to deny chest pain or other anginal symptoms. He denies LE swelling, PND, orthopnea, dizziness or syncope. He states that he has no had any alcohol since hospital discharge and has no desire to drink. We discussed the need to perform an ischemic evaluation given his  significantly reduced LVEF and he agrees. He has been completely compliant with his medications and diet. States that he has been staying away from eating out and canned foods. He weighs himself daily and denies a significant increase.    Past Medical History:  Diagnosis Date   Asthma     No past surgical history on file.   Current Outpatient Medications  Medication Sig Dispense Refill   carvedilol (COREG) 3.125 MG tablet Take 1 tablet (3.125 mg total) by mouth 2 (two) times daily with a meal. 60 tablet 2   EPINEPHrine (PRIMATENE MIST) 0.125 MG/ACT AERO Inhale 1 puff into the lungs 3 (three) times daily as needed (shortness od breath).     furosemide (LASIX) 40 MG tablet Take 1 tablet (40 mg total) by mouth daily. 30 tablet 1   losartan (COZAAR) 25 MG tablet Take 1 tablet (25 mg total) by mouth daily. 30 tablet 2   Multiple Vitamin (MULTIVITAMIN WITH MINERALS) TABS tablet Take 1 tablet by mouth daily.     No current facility-administered medications for this visit.     Allergies:   Patient has no known allergies.    Social History:  The patient  reports that he quit smoking about 5 weeks ago. His smoking use included cigars. He quit after 15.00 years of use. He has never used smokeless tobacco. He reports current alcohol use of about 51.0 standard drinks of alcohol per week. He reports current drug use. Drug: Marijuana.   Family History:  Father: Hypertension  ROS:  Please see the history of present illness. Otherwise, review of systems are positive for none. All other systems are reviewed and negative.    PHYSICAL EXAM: VS:  BP: 130/76, HR: 78, Weight: 195lb, SpO2: 98%  General: Well developed, well nourished, NAD Skin: Warm, dry, intact  Head: Normocephalic, atraumatic, clear, moist mucus membranes. Neck: Negative for carotid bruits. No JVD Lungs:Clear to ausculation bilaterally. No wheezes, rales, or rhonchi. Breathing is unlabored. Cardiovascular: RRR with S1 S2.  No murmurs, rubs, gallops, or LV heave appreciated. MSK: Strength and tone appear normal for age. 5/5 in all extremities Extremities: No edema. No clubbing or cyanosis. DP/PT pulses 2+ bilaterally Neuro: Alert and oriented. No focal deficits. No facial asymmetry. MAE spontaneously. Psych: Responds to questions appropriately with normal affect.     EKG:  EKG is ordered today. The ekg ordered today demonstrates NSR with bigeminy PVCs   Recent Labs: 08/04/2018: ALT 46; B Natriuretic Peptide 1,754.9 08/05/2018: TSH 1.616 08/06/2018: Hemoglobin 12.5; Platelets 163 08/14/2018: BUN 11; Creatinine, Ser 1.20; Potassium 4.9; Sodium 136    Lipid Panel No results found for: CHOL, TRIG, HDL, CHOLHDL, VLDL, LDLCALC, LDLDIRECT    Wt Readings from Last 3 Encounters:  08/14/18 199 lb 4 oz (90.4 kg)  08/07/18 194 lb 0.1 oz (88 kg)     Other studies Reviewed: Additional studies/ records that were reviewed today include:   Echo 08/05/2018  IMPRESSIONS  1. The left ventricle has severely reduced systolic function, with an ejection fraction of 15-20%. The cavity size was moderately dilated. Left ventricular diastolic Doppler parameters are consistent with pseudonormalization. Left ventricular diffuse  hypokinesis with septal-lateral dyssynergy suggestive of nonischemic cardiomyopathy. No obvious LV mural thrombus visualized with Definity contrast. 2. The right ventricle has moderately reduced systolic function. The cavity was normal. There is no increase in right ventricular wall thickness. Right ventricular systolic pressure could not be assessed. 3. Left atrial size was moderately dilated. 4. Small pericardial effusion. 5. The pericardial effusion is posterior to the left ventricle. 6. The mitral valve is normal in structure. 7. The tricuspid valve is normal in structure. 8. The aortic valve is tricuspid. 9. The aortic root is normal in size and structure.  ASSESSMENT AND PLAN:  1.  Acute Systolic CHF/cardiomyopathy: -Echocardiogram showed LVEF of 15 to 20% with diffuse hypokinesis and septal- lateral dyssynergy suggestive of nonischemic cardiomyopathy, likely due to alcohol use and uncontrolled hypertension  -Continue PO Lasix 40 mg daily, Losartan 25mg  daily -Will increase carvedilol to 6.25mg  twice daily given PVC's on EKG>>no reports of palpations  -Appears euvolemic on exam today continue today  -Monitor weight daily as well as fluid and salt restriction>>compliant  -Patient denies angina and has no family history of CAD, however given reduced LVEF, will schedule for an outpatient coronary CT for further ischemic evaluation -Will re-check BMET today given elevated creatinine during hospitalization   2. Hypertension: -Stable, 130/76 -Continue current regimen  -Increase carvedilol given PVCs   3. Alcohol Abuse: -Patient reports drinking about 1/5 of liquor every night>>reports no ETOH since discharge  -Cessation continually encouraged  4. Tobacco Use: -Cessation encouraged   5. Acute renal insuffiencey: -Creatinine, 1.20 at discharge  -Will obtain BMET today    Current medicines are reviewed at length with the patient today. The patient does not have concerns regarding medicines.  The following changes have been made:  Increase carvedilol to 6.25mg  twice daily   Labs/ tests ordered today include: BMET  No orders of the defined types  were placed in this encounter.   Disposition:   FU with Dr. Royann Guerrero in 3 months  Signed, Georgie Chard, NP  08/23/2018 11:14 AM    [facility] [address] [place], [place], [place]  [address] Phone: [phone]; Fax: [phone]

## 2018-08-24 NOTE — Addendum Note (Signed)
Addended by: Dorris Fetch on: 08/24/2018 04:39 PM   Modules accepted: Orders

## 2018-08-24 NOTE — Patient Instructions (Addendum)
Medication Instructions:  INCREASE CARVEDILOL to 6.25 mg 2 times a day  If you need a refill on your cardiac medications before your next appointment, please call your pharmacy.   Lab work: You will need to have labs (blood work) drawn today:  BMET   YOU WILL ALSO NEED TO RETURN TO OUR OFFICE 1 WEEK PRIOR TO THE PROCEDURE TO HAVE LABS DRAWN  If you have labs (blood work) drawn today and your tests are completely normal, you will receive your results only by: Marland Kitchen MyChart Message (if you have MyChart) OR . A paper copy in the mail If you have any lab test that is abnormal or we need to change your treatment, we will call you to review the results.  Testing/Procedures: Your physician has requested that you have cardiac CT. Cardiac computed tomography (CT) is a painless test that uses an x-ray machine to take clear, detailed pictures of your heart. For further information please visit [email or web address] Please follow instruction sheet as given.     Follow-Up: At [facility], you and your health needs are our priority.  As part of our continuing mission to provide you with exceptional heart care, we have created designated Provider Care Teams.  These Care Teams include your primary Cardiologist (physician) and Advanced Practice Providers (APPs -  Physician Assistants and Nurse Practitioners) who all work together to provide you with the care you need, when you need it. You will need a follow up appointment in 3 months.  Please call our office 2 months in advance to schedule this appointment.  You may see Thurmon Fair, MD or one of the following Advanced Practice Providers on your designated Care Team: [place], [place] . Micah Flesher, PA-C  Any Other Special Instructions Will Be Listed Below (If Applicable).     Please arrive at the [facility] main entrance of [facility] at xx:xx AM (30-45 minutes prior to test start time)  [facility] [address] [place], [place] [address] [phone]  Proceed to the [facility] Radiology Department (First Floor).  Please follow these instructions carefully (unless otherwise directed):    On the Night Before the Test: . Be sure to Drink plenty of water. . Do not consume any caffeinated/decaffeinated beverages or chocolate 12 hours prior to your test. . Do not take any antihistamines 12 hours prior to your test. . If you take Metformin do not take 24 hours prior to test. . If the patient has contrast allergy: ? Patient will need a prescription for Prednisone and very clear instructions (as follows): 1. Prednisone 50 mg - take 13 hours prior to test 2. Take another Prednisone 50 mg 7 hours prior to test 3. Take another Prednisone 50 mg 1 hour prior to test 4. Take Benadryl 50 mg 1 hour prior to test . Patient must complete all four doses of above prophylactic medications. . Patient will need a ride after test due to Benadryl.  On the Day of the Test: . Drink plenty of water. Do not drink any water within one hour of the test. . Do not eat any food 4 hours prior to the test. . You may take your regular medications prior to the test.  . Take metoprolol (Lopressor) two hours prior to test. . HOLD Furosemide/Hydrochlorothiazide morning of the test.   *For Clinical Staff only. Please instruct patient the following:*        -Drink plenty of water       -  Hold Furosemide/hydrochlorothiazide morning of the test       -Take metoprolol (Lopressor) 2 hours prior to test (if applicable).                 Do not give Lopressor to patients with an allergy to lopressor or anyone with asthma or active COPD symptoms (currently taking steroids).       After the Test: . Drink plenty of water. . After receiving IV contrast, you may experience a mild flushed feeling. This is normal. . On occasion, you may experience a mild rash up to 24 hours after the test. This is not dangerous. If this occurs, you  can take Benadryl 25 mg and increase your fluid intake. . If you experience trouble breathing, this can be serious. If it is severe call 911 IMMEDIATELY. If it is mild, please call our office. . If you take any of these medications: Glipizide/Metformin, Avandament, Glucavance, please do not take 48 hours after completing test.

## 2018-08-25 LAB — BASIC METABOLIC PANEL
BUN/Creatinine Ratio: 11 (ref 9–20)
BUN: 12 mg/dL (ref 6–24)
CALCIUM: 10 mg/dL (ref 8.7–10.2)
CO2: 21 mmol/L (ref 20–29)
Chloride: 102 mmol/L (ref 96–106)
Creatinine, Ser: 1.14 mg/dL (ref 0.76–1.27)
GFR calc non Af Amer: 77 mL/min/{1.73_m2} (ref >59)
GFR, EST AFRICAN AMERICAN: 89 mL/min/{1.73_m2} (ref >59)
Glucose: 113 mg/dL — ABNORMAL HIGH (ref 65–99)
Potassium: 4.3 mmol/L (ref 3.5–5.2)
Sodium: 138 mmol/L (ref 134–144)

## 2018-08-27 ENCOUNTER — Telehealth: Payer: Self-pay

## 2018-08-27 NOTE — Telephone Encounter (Signed)
Notes recorded by Sigurd Sos, RN on 08/27/2018 at 8:19 AM EDT Lpm that labs are normal and no medication changes at this time. ------

## 2018-08-27 NOTE — Telephone Encounter (Signed)
-----   Message from Filbert Schilder, NP sent at 08/25/2018  7:39 AM EDT ----- Please let the patient know that his labs were within normal limits and there are no medication changes needed at this time  Thank you  Noreene Larsson

## 2018-09-12 ENCOUNTER — Ambulatory Visit: Payer: Self-pay | Admitting: Family Medicine

## 2018-09-13 ENCOUNTER — Telehealth: Payer: Self-pay | Admitting: *Deleted

## 2018-09-13 NOTE — Telephone Encounter (Signed)
Left message for patient to call and schedule 3 mos f/u with Dr. Royann Shivers per 08/24/18 AVS (please schedule in July)

## 2018-09-18 ENCOUNTER — Encounter: Payer: Self-pay | Admitting: *Deleted

## 2018-10-05 ENCOUNTER — Other Ambulatory Visit: Payer: Self-pay | Admitting: Family Medicine

## 2018-10-25 ENCOUNTER — Telehealth: Payer: Self-pay | Admitting: Cardiovascular Disease

## 2018-10-25 NOTE — Telephone Encounter (Signed)
Left message to call and schedule cardiac ct  °

## 2018-11-12 ENCOUNTER — Other Ambulatory Visit: Payer: Self-pay | Admitting: Family Medicine

## 2018-12-31 ENCOUNTER — Telehealth: Payer: Self-pay | Admitting: *Deleted

## 2018-12-31 NOTE — Telephone Encounter (Signed)
Left a message for the patient to call back concerning his appointment tomorrow with Dr. Sallyanne Kuster whether he would like a virtual or office visit.

## 2019-01-01 ENCOUNTER — Ambulatory Visit: Payer: Self-pay | Admitting: Cardiovascular Disease

## 2019-09-19 ENCOUNTER — Ambulatory Visit: Payer: Self-pay | Attending: Internal Medicine

## 2019-09-19 DIAGNOSIS — Z23 Encounter for immunization: Secondary | ICD-10-CM

## 2019-09-19 NOTE — Progress Notes (Signed)
   Covid-19 Vaccination Clinic  Name:  Bryan Guerrero    MRN: [id number] DOB: 14-Nov-1970  09/19/2019  Bryan Guerrero was observed post Covid-19 immunization for 15 minutes without incident. He was provided with Vaccine Information Sheet and instruction to access the V-Safe system.   Bryan Guerrero was instructed to call 911 with any severe reactions post vaccine: Marland Kitchen Difficulty breathing  . Swelling of face and throat  . A fast heartbeat  . A bad rash all over body  . Dizziness and weakness   Immunizations Administered    Name Date Dose VIS Date Route   Pfizer COVID-19 Vaccine 09/19/2019  9:57 AM 0.3 mL 05/24/2019 Intramuscular   Manufacturer: [employer], [employer]   Lot: [id number]   NDC: [address]-8

## 2019-10-16 ENCOUNTER — Ambulatory Visit: Payer: Self-pay | Attending: Internal Medicine

## 2019-10-16 DIAGNOSIS — Z23 Encounter for immunization: Secondary | ICD-10-CM

## 2019-10-16 NOTE — Progress Notes (Signed)
   Covid-19 Vaccination Clinic  Name:  Bryan Guerrero    MRN: [id number] DOB: 04-02-1971  10/16/2019  [name] was observed post Covid-19 immunization for 15 minutes without incident. He was provided with Vaccine Information Sheet and instruction to access the V-Safe system.   [name] was instructed to call 911 with any severe reactions post vaccine: Marland Kitchen Difficulty breathing  . Swelling of face and throat  . A fast heartbeat  . A bad rash all over body  . Dizziness and weakness   Immunizations Administered    Name Date Dose VIS Date Route   Pfizer COVID-19 Vaccine 10/16/2019  4:28 PM 0.3 mL 08/07/2018 Intramuscular   Manufacturer: [employer], [employer]   Lot: [id number]   NDC: [address]-7

## 2020-03-16 ENCOUNTER — Emergency Department (HOSPITAL_COMMUNITY): Payer: Self-pay

## 2020-03-16 ENCOUNTER — Other Ambulatory Visit: Payer: Self-pay

## 2020-03-16 ENCOUNTER — Inpatient Hospital Stay (HOSPITAL_COMMUNITY)
Admission: EM | Admit: 2020-03-16 | Discharge: 2020-03-26 | DRG: 286 | Disposition: A | Discharge status: Home / Self Care | Payer: Self-pay | Attending: Family Medicine | Admitting: Family Medicine

## 2020-03-16 ENCOUNTER — Encounter (HOSPITAL_COMMUNITY): Payer: Self-pay | Admitting: Cardiology

## 2020-03-16 DIAGNOSIS — I5082 Biventricular heart failure: Secondary | ICD-10-CM | POA: Diagnosis present

## 2020-03-16 DIAGNOSIS — Z79899 Other long term (current) drug therapy: Secondary | ICD-10-CM

## 2020-03-16 DIAGNOSIS — I5043 Acute on chronic combined systolic (congestive) and diastolic (congestive) heart failure: Secondary | ICD-10-CM | POA: Diagnosis present

## 2020-03-16 DIAGNOSIS — I5023 Acute on chronic systolic (congestive) heart failure: Secondary | ICD-10-CM | POA: Diagnosis present

## 2020-03-16 DIAGNOSIS — F1010 Alcohol abuse, uncomplicated: Secondary | ICD-10-CM | POA: Insufficient documentation

## 2020-03-16 DIAGNOSIS — Z9114 Patient's other noncompliance with medication regimen: Secondary | ICD-10-CM

## 2020-03-16 DIAGNOSIS — Z20822 Contact with and (suspected) exposure to covid-19: Secondary | ICD-10-CM | POA: Diagnosis present

## 2020-03-16 DIAGNOSIS — I2510 Atherosclerotic heart disease of native coronary artery without angina pectoris: Secondary | ICD-10-CM | POA: Diagnosis present

## 2020-03-16 DIAGNOSIS — J9600 Acute respiratory failure, unspecified whether with hypoxia or hypercapnia: Secondary | ICD-10-CM | POA: Diagnosis present

## 2020-03-16 DIAGNOSIS — I10 Essential (primary) hypertension: Secondary | ICD-10-CM

## 2020-03-16 DIAGNOSIS — I509 Heart failure, unspecified: Secondary | ICD-10-CM

## 2020-03-16 DIAGNOSIS — N183 Chronic kidney disease, stage 3 unspecified: Secondary | ICD-10-CM | POA: Diagnosis present

## 2020-03-16 DIAGNOSIS — I5021 Acute systolic (congestive) heart failure: Secondary | ICD-10-CM

## 2020-03-16 DIAGNOSIS — F1290 Cannabis use, unspecified, uncomplicated: Secondary | ICD-10-CM | POA: Diagnosis present

## 2020-03-16 DIAGNOSIS — I4892 Unspecified atrial flutter: Secondary | ICD-10-CM | POA: Diagnosis present

## 2020-03-16 DIAGNOSIS — Z8249 Family history of ischemic heart disease and other diseases of the circulatory system: Secondary | ICD-10-CM

## 2020-03-16 DIAGNOSIS — I420 Dilated cardiomyopathy: Secondary | ICD-10-CM | POA: Diagnosis present

## 2020-03-16 DIAGNOSIS — R570 Cardiogenic shock: Secondary | ICD-10-CM | POA: Diagnosis not present

## 2020-03-16 DIAGNOSIS — I340 Nonrheumatic mitral (valve) insufficiency: Secondary | ICD-10-CM | POA: Diagnosis present

## 2020-03-16 DIAGNOSIS — J45909 Unspecified asthma, uncomplicated: Secondary | ICD-10-CM | POA: Diagnosis present

## 2020-03-16 DIAGNOSIS — R7989 Other specified abnormal findings of blood chemistry: Secondary | ICD-10-CM | POA: Diagnosis present

## 2020-03-16 DIAGNOSIS — I130 Hypertensive heart and chronic kidney disease with heart failure and stage 1 through stage 4 chronic kidney disease, or unspecified chronic kidney disease: Principal | ICD-10-CM | POA: Diagnosis present

## 2020-03-16 DIAGNOSIS — F1020 Alcohol dependence, uncomplicated: Secondary | ICD-10-CM | POA: Diagnosis present

## 2020-03-16 DIAGNOSIS — R0602 Shortness of breath: Secondary | ICD-10-CM

## 2020-03-16 DIAGNOSIS — N179 Acute kidney failure, unspecified: Secondary | ICD-10-CM | POA: Diagnosis present

## 2020-03-16 DIAGNOSIS — Z87891 Personal history of nicotine dependence: Secondary | ICD-10-CM

## 2020-03-16 DIAGNOSIS — J449 Chronic obstructive pulmonary disease, unspecified: Secondary | ICD-10-CM | POA: Diagnosis present

## 2020-03-16 DIAGNOSIS — I313 Pericardial effusion (noninflammatory): Secondary | ICD-10-CM | POA: Diagnosis present

## 2020-03-16 DIAGNOSIS — R000 Tachycardia, unspecified: Secondary | ICD-10-CM

## 2020-03-16 DIAGNOSIS — I426 Alcoholic cardiomyopathy: Secondary | ICD-10-CM | POA: Diagnosis present

## 2020-03-16 DIAGNOSIS — E0590 Thyrotoxicosis, unspecified without thyrotoxic crisis or storm: Secondary | ICD-10-CM | POA: Diagnosis present

## 2020-03-16 HISTORY — DX: Dyspnea, unspecified: R06.00

## 2020-03-16 LAB — CBC
HCT: 42.8 % (ref 39.0–52.0)
Hemoglobin: 13.4 g/dL (ref 13.0–17.0)
MCH: 30 pg (ref 26.0–34.0)
MCHC: 31.3 g/dL (ref 30.0–36.0)
MCV: 95.7 fL (ref 80.0–100.0)
Platelets: 276 10*3/uL (ref 150–400)
RBC: 4.47 MIL/uL (ref 4.22–5.81)
RDW: 12.1 % (ref 11.5–15.5)
WBC: 6.6 10*3/uL (ref 4.0–10.5)
nRBC: 0 % (ref 0.0–0.2)

## 2020-03-16 LAB — TROPONIN I (HIGH SENSITIVITY)
Troponin I (High Sensitivity): 53 ng/L — ABNORMAL HIGH (ref <18)
Troponin I (High Sensitivity): 52 ng/L — ABNORMAL HIGH (ref <18)

## 2020-03-16 LAB — HEPATIC FUNCTION PANEL
ALT: 39 U/L (ref 0–44)
AST: 33 U/L (ref 15–41)
Albumin: 3.6 g/dL (ref 3.5–5.0)
Alkaline Phosphatase: 49 U/L (ref 38–126)
Bilirubin, Direct: 0.3 mg/dL — ABNORMAL HIGH (ref 0.0–0.2)
Indirect Bilirubin: 1 mg/dL — ABNORMAL HIGH (ref 0.3–0.9)
Total Bilirubin: 1.3 mg/dL — ABNORMAL HIGH (ref 0.3–1.2)
Total Protein: 6 g/dL — ABNORMAL LOW (ref 6.5–8.1)

## 2020-03-16 LAB — BASIC METABOLIC PANEL
Anion gap: 11 (ref 5–15)
BUN: 23 mg/dL — ABNORMAL HIGH (ref 6–20)
CO2: 21 mmol/L — ABNORMAL LOW (ref 22–32)
Calcium: 9.1 mg/dL (ref 8.9–10.3)
Chloride: 103 mmol/L (ref 98–111)
Creatinine, Ser: 1.63 mg/dL — ABNORMAL HIGH (ref 0.61–1.24)
GFR calc Af Amer: 58 mL/min — ABNORMAL LOW (ref >60)
GFR calc non Af Amer: 50 mL/min — ABNORMAL LOW (ref >60)
Glucose, Bld: 141 mg/dL — ABNORMAL HIGH (ref 70–99)
Potassium: 4.1 mmol/L (ref 3.5–5.1)
Sodium: 135 mmol/L (ref 135–145)

## 2020-03-16 LAB — TSH: TSH: 2.391 u[IU]/mL (ref 0.350–4.500)

## 2020-03-16 LAB — MAGNESIUM: Magnesium: 2.2 mg/dL (ref 1.7–2.4)

## 2020-03-16 LAB — RESPIRATORY PANEL BY RT PCR (FLU A&B, COVID)
Influenza A by PCR: NEGATIVE
Influenza B by PCR: NEGATIVE
SARS Coronavirus 2 by RT PCR: NEGATIVE

## 2020-03-16 LAB — T4, FREE: Free T4: 1.39 ng/dL — ABNORMAL HIGH (ref 0.61–1.12)

## 2020-03-16 LAB — HIV ANTIBODY (ROUTINE TESTING W REFLEX): HIV Screen 4th Generation wRfx: NONREACTIVE

## 2020-03-16 LAB — BRAIN NATRIURETIC PEPTIDE: B Natriuretic Peptide: 1394.3 pg/mL — ABNORMAL HIGH (ref 0.0–100.0)

## 2020-03-16 MED ORDER — FOLIC ACID 1 MG PO TABS
1.0000 mg | ORAL_TABLET | Freq: Every day | ORAL | Status: DC
  Administered 2020-03-16 – 2020-03-26 (×11): 1 mg via ORAL
  Filled 2020-03-16 (×12): qty 1

## 2020-03-16 MED ORDER — HEPARIN BOLUS VIA INFUSION
5000.0000 [IU] | Freq: Once | INTRAVENOUS | Status: DC
  Filled 2020-03-16: qty 5000

## 2020-03-16 MED ORDER — SODIUM CHLORIDE 0.9 % IV SOLN: 250.0000 mL | INTRAVENOUS | Status: DC | PRN

## 2020-03-16 MED ORDER — APIXABAN 5 MG PO TABS
5.0000 mg | ORAL_TABLET | Freq: Two times a day (BID) | ORAL | Status: DC
  Administered 2020-03-16 – 2020-03-18 (×4): 5 mg via ORAL
  Filled 2020-03-16 (×4): qty 1

## 2020-03-16 MED ORDER — SODIUM CHLORIDE 0.9% FLUSH: 3.0000 mL | INTRAVENOUS | Status: DC | PRN

## 2020-03-16 MED ORDER — ONDANSETRON HCL 4 MG/2ML IJ SOLN
4.0000 mg | Freq: Four times a day (QID) | INTRAMUSCULAR | Status: DC | PRN
  Administered 2020-03-16: 4 mg via INTRAVENOUS
  Filled 2020-03-16: qty 2

## 2020-03-16 MED ORDER — IOHEXOL 350 MG/ML SOLN
75.0000 mL | Freq: Once | INTRAVENOUS | Status: AC | PRN
  Administered 2020-03-16: 75 mL via INTRAVENOUS

## 2020-03-16 MED ORDER — AMIODARONE HCL IN DEXTROSE 360-4.14 MG/200ML-% IV SOLN
30.0000 mg/h | INTRAVENOUS | Status: DC
  Administered 2020-03-16: 30 mg/h via INTRAVENOUS
  Filled 2020-03-16 (×3): qty 200

## 2020-03-16 MED ORDER — SODIUM CHLORIDE 0.9% FLUSH
3.0000 mL | Freq: Two times a day (BID) | INTRAVENOUS | Status: DC
  Administered 2020-03-16 – 2020-03-19 (×7): 3 mL via INTRAVENOUS

## 2020-03-16 MED ORDER — APIXABAN 5 MG PO TABS
10.0000 mg | ORAL_TABLET | Freq: Once | ORAL | Status: AC
  Administered 2020-03-16: 10 mg via ORAL
  Filled 2020-03-16: qty 2

## 2020-03-16 MED ORDER — HEPARIN (PORCINE) 25000 UT/250ML-% IV SOLN
1200.0000 [IU]/h | INTRAVENOUS | Status: DC
  Filled 2020-03-16: qty 250

## 2020-03-16 MED ORDER — ADULT MULTIVITAMIN W/MINERALS CH
1.0000 | ORAL_TABLET | Freq: Every day | ORAL | Status: DC
  Administered 2020-03-16 – 2020-03-26 (×11): 1 via ORAL
  Filled 2020-03-16 (×12): qty 1

## 2020-03-16 MED ORDER — SODIUM CHLORIDE 0.9 % IV BOLUS
250.0000 mL | Freq: Once | INTRAVENOUS | Status: AC
  Administered 2020-03-16: 250 mL via INTRAVENOUS

## 2020-03-16 MED ORDER — METOPROLOL TARTRATE 5 MG/5ML IV SOLN
5.0000 mg | Freq: Once | INTRAVENOUS | Status: AC
  Administered 2020-03-16: 5 mg via INTRAVENOUS
  Filled 2020-03-16: qty 5

## 2020-03-16 MED ORDER — FUROSEMIDE 10 MG/ML IJ SOLN
40.0000 mg | Freq: Two times a day (BID) | INTRAMUSCULAR | Status: DC
  Administered 2020-03-16 (×2): 40 mg via INTRAVENOUS
  Filled 2020-03-16 (×2): qty 4

## 2020-03-16 MED ORDER — THIAMINE HCL 100 MG PO TABS
100.0000 mg | ORAL_TABLET | Freq: Every day | ORAL | Status: DC
  Administered 2020-03-16 – 2020-03-26 (×11): 100 mg via ORAL
  Filled 2020-03-16 (×11): qty 1

## 2020-03-16 MED ORDER — LORAZEPAM 2 MG/ML IJ SOLN
1.0000 mg | Freq: Once | INTRAMUSCULAR | Status: AC
  Administered 2020-03-16: 1 mg via INTRAVENOUS
  Filled 2020-03-16: qty 1

## 2020-03-16 MED ORDER — SODIUM CHLORIDE 0.9 % IV SOLN
500.0000 mg | INTRAVENOUS | Status: DC
  Administered 2020-03-16: 500 mg via INTRAVENOUS
  Filled 2020-03-16: qty 500

## 2020-03-16 MED ORDER — AMIODARONE HCL IN DEXTROSE 360-4.14 MG/200ML-% IV SOLN
60.0000 mg/h | INTRAVENOUS | Status: AC
  Administered 2020-03-16: 60 mg/h via INTRAVENOUS

## 2020-03-16 MED ORDER — ACETAMINOPHEN 325 MG PO TABS
650.0000 mg | ORAL_TABLET | ORAL | Status: DC | PRN
  Administered 2020-03-16: 650 mg via ORAL
  Filled 2020-03-16: qty 2

## 2020-03-16 MED ORDER — CARVEDILOL 3.125 MG PO TABS
3.1250 mg | ORAL_TABLET | Freq: Two times a day (BID) | ORAL | Status: DC
  Administered 2020-03-16: 3.125 mg via ORAL
  Filled 2020-03-16: qty 1

## 2020-03-16 MED ORDER — MAGNESIUM SULFATE 2 GM/50ML IV SOLN
2.0000 g | Freq: Once | INTRAVENOUS | Status: AC
  Administered 2020-03-16: 2 g via INTRAVENOUS
  Filled 2020-03-16: qty 50

## 2020-03-16 NOTE — ED Provider Notes (Signed)
[facility] Provider Note  CSN: [id number] Arrival date & time: 03/16/20 9373    History Chief Complaint  Patient presents with  . Chest Pain    HPI  Bryan Guerrero is a 49 y.o. male with history of non-ischemic cardiomyopathy diagnosed on an admission in Feb 2020. EF at that time was 15-20%. He was taking diuretics for a short time after discharge but not taking any at this time. He reports about 1-2 weeks of SOB that is worse when lying down. Associated with occasional mild chest pains. Not worse with exertion. No associated cough or fever. He has noticed some leg swelling. He also has a history of alcohol and tobacco use, no longer drinks alcohol and reports he has been trying to cut back on cigarettes. He was seen once in follow up by Cardiology after his admission but seems to have been lost to follow up since then.   Past Medical History:  Diagnosis Date  . Alcohol abuse   . Asthma   . Cardiomyopathy (HCC)   . Hypertension     History reviewed. No pertinent surgical history.  Family History  Problem Relation Age of Onset  . Hypertension Father     Social History   Tobacco Use  . Smoking status: Former Smoker    Years: 15.00    Types: Cigars    Quit date: 07/14/2018    Years since quitting: 1.6  . Smokeless tobacco: Never Used  Vaping Use  . Vaping Use: Never used  Substance Use Topics  . Alcohol use: Yes    Alcohol/week: 51.0 standard drinks    Types: 50 Shots of liquor, 1 Cans of beer per week  . Drug use: Yes    Types: Marijuana     Home Medications Prior to Admission medications   Medication Sig Start Date End Date Taking? Authorizing Provider  EPINEPHrine (PRIMATENE MIST) 0.125 MG/ACT AERO Inhale 1 puff into the lungs 3 (three) times daily as needed (shortness od breath).   Yes [provider]  Multiple Vitamin (MULTIVITAMIN WITH MINERALS) TABS tablet Take 1 tablet by mouth daily.   Yes [provider]    carvedilol (COREG) 3.125 MG tablet Take 1 tablet by mouth twice daily with food Patient not taking: Reported on 03/16/2020 11/12/18   Lovena Neighbours, MD  carvedilol (COREG) 6.25 MG tablet Take 1 tablet (6.25 mg total) by mouth 2 (two) times daily with a meal. Patient not taking: Reported on 03/16/2020 08/24/18 03/16/29  Georgie Chard D, NP  furosemide (LASIX) 40 MG tablet TAKE 1 TABLET BY MOUTH EVERY DAY Patient not taking: Reported on 03/16/2020 11/12/18   Lovena Neighbours, MD  losartan (COZAAR) 25 MG tablet TAKE 1 TABLET BY MOUTH EVERY DAY Patient not taking: Reported on 03/16/2020 11/12/18   Lovena Neighbours, MD  metoprolol tartrate (LOPRESSOR) 50 MG tablet Take 2 hours prior to procedure Patient not taking: Reported on 03/16/2020 08/24/18   Filbert Schilder, NP     Allergies    Patient has no known allergies.   Review of Systems   Review of Systems A comprehensive review of systems was completed and negative except as noted in HPI.    Physical Exam BP (!) 122/103 (BP Location: Left Arm)   Pulse (!) 136   Temp 98.7 F (37.1 C) (Oral)   Resp 19   Ht 6' (1.829 m)   Wt 86.2 kg   SpO2 94%   BMI 25.77 kg/m   Physical Exam  Vitals and nursing note reviewed.  Constitutional:      Appearance: Normal appearance.  HENT:     Head: Normocephalic and atraumatic.     Nose: Nose normal.     Mouth/Throat:     Mouth: Mucous membranes are moist.  Eyes:     Extraocular Movements: Extraocular movements intact.     Conjunctiva/sclera: Conjunctivae normal.  Cardiovascular:     Rate and Rhythm: Tachycardia present.  Pulmonary:     Effort: Pulmonary effort is normal.     Breath sounds: Normal breath sounds.  Abdominal:     General: Abdomen is flat.     Palpations: Abdomen is soft.     Tenderness: There is no abdominal tenderness.  Musculoskeletal:        General: No swelling. Normal range of motion.     Cervical back: Neck supple.     Right lower leg: Edema (1+) present.     Left lower  leg: Edema (1+) present.  Skin:    General: Skin is warm and dry.  Neurological:     General: No focal deficit present.     Mental Status: He is alert.  Psychiatric:        Mood and Affect: Mood normal.      ED Results / Procedures / Treatments   Labs (all labs ordered are listed, but only abnormal results are displayed) Labs Reviewed  BASIC METABOLIC PANEL - Abnormal; Notable for the following components:      Result Value   CO2 21 (*)    Glucose, Bld 141 (*)    BUN 23 (*)    Creatinine, Ser 1.63 (*)    GFR calc non Af Amer 50 (*)    GFR calc Af Amer 58 (*)    All other components within normal limits  BRAIN NATRIURETIC PEPTIDE - Abnormal; Notable for the following components:   B Natriuretic Peptide 1,394.3 (*)    All other components within normal limits  HEPATIC FUNCTION PANEL - Abnormal; Notable for the following components:   Total Protein 6.0 (*)    Total Bilirubin 1.3 (*)    Bilirubin, Direct 0.3 (*)    Indirect Bilirubin 1.0 (*)    All other components within normal limits  TROPONIN I (HIGH SENSITIVITY) - Abnormal; Notable for the following components:   Troponin I (High Sensitivity) 53 (*)    All other components within normal limits  TROPONIN I (HIGH SENSITIVITY) - Abnormal; Notable for the following components:   Troponin I (High Sensitivity) 52 (*)    All other components within normal limits  RESPIRATORY PANEL BY RT PCR (FLU A&B, COVID)  CBC  MAGNESIUM  T4, FREE  TSH    EKG EKG Interpretation  Date/Time:  Monday March 16 2020 07:38:12 EDT Ventricular Rate:  137 PR Interval:  136 QRS Duration: 107 QT Interval:  333 QTC Calculation: 501 R Axis:   15 Text Interpretation: Sinus or ectopic atrial tachycardia PVCs LVH with secondary repolarization abnormality Inferior infarct, acute (LCx) Anterior ST elevation, probably due to LVH Prolonged QT interval No significant change since last tracing Confirmed by Susy Frizzle [phone]) on 03/16/2020 7:43:32  AM   Radiology CT Abdomen Pelvis Wo Contrast  Result Date: 03/16/2020 CLINICAL DATA:  Chest pain, pneumoperitoneum.  Nausea. EXAM: CT ABDOMEN AND PELVIS WITHOUT CONTRAST TECHNIQUE: Multidetector CT imaging of the abdomen and pelvis was performed following the standard protocol without IV contrast. COMPARISON:  CTA of same day. FINDINGS: Lower chest: No acute abnormality. Hepatobiliary:  No focal liver abnormality is seen. No gallstones, gallbladder wall thickening, or biliary dilatation. Pancreas: Unremarkable. No pancreatic ductal dilatation or surrounding inflammatory changes. Spleen: Normal in size without focal abnormality. Adrenals/Urinary Tract: Adrenal glands are unremarkable. Kidneys are normal, without renal calculi, focal lesion, or hydronephrosis. Bladder is unremarkable. Stomach/Bowel: Stomach is within normal limits. Appendix appears normal. No evidence of bowel wall thickening, distention, or inflammatory changes. That which was described as free air on prior CT exam of same day actually appears to represent a portion of colon that is interposed between the liver and diaphragm. There is no evidence of pneumoperitoneum. Vascular/Lymphatic: No significant vascular findings are present. No enlarged abdominal or pelvic lymph nodes. Reproductive: Prostate is unremarkable. Other: Small amount of free fluid is noted in the dependent portion of the pelvis of unknown etiology. No hernia is noted. Musculoskeletal: No acute or significant osseous findings. IMPRESSION: 1. Small amount of free fluid is noted in the dependent portion of the pelvis of unknown etiology. 2. The finding which was described as free air on prior CT exam of same day actually appears to represent a portion of colon that is interposed between the liver and diaphragm. There is no evidence of pneumoperitoneum. 3. No significant abnormality is noted in the abdomen or pelvis. Electronically Signed   By: Lupita Raider M.D.   On: 03/16/2020  10:56   DG Chest 2 View  Result Date: 03/16/2020 CLINICAL DATA:  Chest pain EXAM: CHEST - 2 VIEW COMPARISON:  08/04/2018 FINDINGS: Similar enlargement cardiopericardial silhouette. The lungs are clear without focal pneumonia, edema, pneumothorax or pleural effusion. The visualized bony structures of the thorax show no acute abnormality. IMPRESSION: Similar enlargement of the cardiopericardial silhouette. No acute findings. Electronically Signed   By: Kennith Center M.D.   On: 03/16/2020 05:05   CT Angio Chest PE W/Cm &/Or Wo Cm  Result Date: 03/16/2020 CLINICAL DATA:  Chest pain and nausea EXAM: CT ANGIOGRAPHY CHEST WITH CONTRAST TECHNIQUE: Multidetector CT imaging of the chest was performed using the standard protocol during bolus administration of intravenous contrast. Multiplanar CT image reconstructions and MIPs were obtained to evaluate the vascular anatomy. CONTRAST:  5mL OMNIPAQUE IOHEXOL 350 MG/ML SOLN COMPARISON:  March 16, 2020 FINDINGS: Cardiovascular: There is no demonstrable pulmonary embolus. There is no thoracic aortic aneurysm. No dissection evident; the contrast bolus in the aorta is not sufficient for potential dissection assessment. Visualized great vessels appear unremarkable on this study with limited contrast in the great vessels. There is no pericardial effusion or pericardial thickening evident. There is a degree of cardiomegaly. Mediastinum/Nodes: Thyroid appears unremarkable. There is no appreciable thoracic adenopathy. No esophageal lesions are evident. Lungs/Pleura: There are scattered areas of ill-defined airspace opacity in each upper lobe region. There is no consolidation on either side. There is mild scarring in the bases. No pleural effusions are evident. There is bronchiectatic change in each lower lobe. Equivocal bronchiectatic change noted in the upper lobes. A small bulla measuring 8 x 8 mm is noted in the right upper lobe. Upper Abdomen: There is pneumoperitoneum with a  degree of upper abdominal ascites. Visualized upper abdominal structures otherwise appear unremarkable. Musculoskeletal: No blastic or lytic bone lesions. No chest wall lesions evident. Review of the MIP images confirms the above findings. IMPRESSION: 1. There is pneumoperitoneum with upper abdominal ascites. Etiology for this pneumoperitoneum uncertain. Question recent surgery as a potential etiology. If patient has not had recent surgical procedure involving the abdomen or pelvis, perforated viscus must  be of concern based on this appearance. 2. No evident pulmonary embolus. No thoracic aortic aneurysm. No dissection evident; note that the contrast bolus in the aorta is not sufficient for dissection assessment. 3. Scattered areas of ill-defined airspace opacity in the upper lobes raises question for underlying atypical organism pneumonia. Check of COVID-19 status advised in this regard. No consolidation or pleural effusions. 4. Lower lobe bronchiectatic change bilaterally. Scattered areas of lower lobe scarring. 5.  No evident adenopathy. 6.  Cardiomegaly. Critical Value/emergent results were called by telephone at the time of interpretation on 03/16/2020 at 8:48 am to provider [facility] , who verbally acknowledged these results. Electronically Signed   By: Bretta Bang III M.D.   On: 03/16/2020 08:48    Procedures .Critical Care Performed by: Pollyann Savoy, MD Authorized by: Pollyann Savoy, MD   Critical care provider statement:    Critical care time (minutes):  45   Critical care time was exclusive of:  Separately billable procedures and treating other patients   Critical care was necessary to treat or prevent imminent or life-threatening deterioration of the following conditions:  Respiratory failure and circulatory failure   Critical care was time spent personally by me on the following activities:  Discussions with consultants, evaluation of patient's response to treatment,  examination of patient, ordering and performing treatments and interventions, ordering and review of laboratory studies, ordering and review of radiographic studies, pulse oximetry, re-evaluation of patient's condition, obtaining history from patient or surrogate and review of old charts    Medications Ordered in the ED Medications  amiodarone (NEXTERONE PREMIX) 360-4.14 MG/200ML-% (1.8 mg/mL) IV infusion (60 mg/hr Intravenous New Bag/Given 03/16/20 1240)  amiodarone (NEXTERONE PREMIX) 360-4.14 MG/200ML-% (1.8 mg/mL) IV infusion (has no administration in time range)  furosemide (LASIX) injection 40 mg (40 mg Intravenous Given 03/16/20 1225)  carvedilol (COREG) tablet 3.125 mg (has no administration in time range)  apixaban (ELIQUIS) tablet 5 mg (has no administration in time range)  LORazepam (ATIVAN) injection 1 mg (1 mg Intravenous Given 03/16/20 0816)  iohexol (OMNIPAQUE) 350 MG/ML injection 75 mL (75 mLs Intravenous Contrast Given 03/16/20 0825)  metoprolol tartrate (LOPRESSOR) injection 5 mg (5 mg Intravenous Given 03/16/20 1119)  apixaban (ELIQUIS) tablet 10 mg (10 mg Oral Given 03/16/20 1244)     MDM Rules/Calculators/A&P MDM Patient with known cardiomyopathy, presumed to be non-ischemic (possible alcohol related) from an admission last year but was lost to follow up and ischemic workup was not completed (did not have a Coronary CT done). He is back today with increasing SOB and orthopnea. Minimal chest pains. Noted to be normal HR in triage but tachycardic on arrival to the room. Consider PE given his mildly elevated Trop. His Cr is also mildly elevated compared to previous.  ED Course  I have reviewed the triage vital signs and the nursing notes.  Pertinent labs & imaging results that were available during my care of the patient were reviewed by me and considered in my medical decision making (see chart for details).  Clinical Course as of Mar 17 1307  Mon Mar 16, 2020  0806 On  re-eval, patient admits to not seeing a provider in over a year and no longer taking any medications. He also reports he started back drinking about a year ago and only recently has tried to stop that again. He feels a little nervous/jittery which may account for some of his tachycardia. Consider EtOH withdrawal in differential as well. Will give a dose  of Ativan and reassess. Does not appear to be septic. Normal WBC and no fever. Doubt Covid but will check that. Added LFTs and BNP to his triage labs. Avoid IVF unless he becomes hypotensive given his history of severe low EF.    [CS]  8366 Reviewed CT images with Radiologist. No PE, but there is pneumoperitoneum of unclear significance. Patient has not had any recent surgery, no abdominal pain. His abdomen is benign on exam. Will send back for dedicated abdomen CT.    [CS]  1031 Spoke with Dr. Anne Fu, Cardiology, who recommends a 5mg  dose of Lopressor for patient's HR. If that is not effective, he would recommend Amiodarone drip. Does not recommend calcium channel blocker due to poor EF. Still awaiting CT abd/pel.    [CS]  1059 CT abd/pel reviewed, no free air. The CTA finding is apparently a loop of bowel.   [CS]  1101 Plan admission to Medicine service   [CS]  1145 Spoke with IM Resident who states this is a FM patient because they are listed as PCP in Epic despite only having been to their office once in March 2020 as a hospital follow up visit. I will consult FM for admission.    [CS]  1249 Spoke with Dr. April 2020, [facility] Resident who will admit.    [CS]    Clinical Course User Index [CS] [facility], MD    Final Clinical Impression(s) / ED Diagnoses Final diagnoses:  SOB (shortness of breath)  Tachycardia  Alcoholic cardiomyopathy (HCC)  Alcohol abuse    Rx / DC Orders ED Discharge Orders    None       Pollyann Savoy, MD 03/16/20 1308

## 2020-03-16 NOTE — Consult Note (Addendum)
Cardiology Consultation:   Patient ID: Bryan Guerrero MRN: [id number]; DOB: 06-17-1970  Admit date: 03/16/2020 Date of Consult: 03/16/2020  Primary Care Provider: Reece Leader, DO [facility] Cardiologist: Thurmon Fair, MD  [facility] Electrophysiologist:  None    Patient Profile:   Bryan Guerrero is a 49 y.o. male with a hx of NICM from 07/2018 and no follow-up, EF 15-20%, no diuretics recently, alcohol abuse, HTN and systolic HF  who is being seen today for the evaluation of chest pain at the request of Dr. Bernette Mayers. Marland Kitchen  History of Present Illness:   Bryan Guerrero with above hx of NICM in 07/2018 and discharged on losartan, coreg and lasix but no longer taking.  His Ef was 15-20%, Last seen 08/24/19 at that time he did feel better.  Was to have a cardiac CTA but then COVID came so unable to plan.   He had stopped ETOH since discharge and continued with tobacco.    Today he presents with chest pain and SOB.  1-2 weeks of SOB increases with lying down.    No cold symptoms, no fever,    He had been doing well until 2 weeks ago,  He was drinking a pint every 3 days and + tobacco but stopped 2 weeks ago.   On CXR 2 View cardiomegaly and CT angio of chest to r/o PE was neg for PE but upper abd with pneumoperitoneum with a degree of upper abdominal ascites.   CT of abd ordered.  The finding which was described as free air on prior CT exam of same day actually appears to represent a portion of colon that is interposed between the liver and diaphragm. There is no evidence of pneumoperitoneum. Small amount of free fluid is noted in the dependent portion of the pelvis of unknown etiology.  EKG:  The EKG was personally reviewed and demonstrates:  Atrial flutter 149 with LVH LAD and non specific T wave abnormalities.   Telemetry:  Telemetry was personally reviewed and demonstrates:  A flutter at 138   Na 135, K+ 4.1, BUN 23, Cr 1.63, total protein 6, direct bili 0.3, indirect bili 1.3  BNP  1394, hs troponin 53 and 52.   WBC 6.6, Hgb 13.4, plts 276  Negative covid  Has rec'd ativan 1 mg IV and lopressor 5 mg IV.  Will need lasix and most likely amiodarone. AST normal    Past Medical History:  Diagnosis Date  . Alcohol abuse   . Asthma   . Cardiomyopathy (HCC)   . Hypertension     No past surgical history on file.   Home Medications:  Prior to Admission medications   Medication Sig Start Date End Date Taking? Authorizing Provider  EPINEPHrine (PRIMATENE MIST) 0.125 MG/ACT AERO Inhale 1 puff into the lungs 3 (three) times daily as needed (shortness od breath).   Yes [provider]  Multiple Vitamin (MULTIVITAMIN WITH MINERALS) TABS tablet Take 1 tablet by mouth daily.   Yes [provider]  carvedilol (COREG) 3.125 MG tablet Take 1 tablet by mouth twice daily with food Patient not taking: Reported on 03/16/2020 11/12/18   Lovena Neighbours, MD  carvedilol (COREG) 6.25 MG tablet Take 1 tablet (6.25 mg total) by mouth 2 (two) times daily with a meal. Patient not taking: Reported on 03/16/2020 08/24/18 03/16/29  Georgie Chard D, NP  furosemide (LASIX) 40 MG tablet TAKE 1 TABLET BY MOUTH EVERY DAY Patient not taking: Reported on 03/16/2020 11/12/18   Diallo,  Abdoulaye, MD  losartan (COZAAR) 25 MG tablet TAKE 1 TABLET BY MOUTH EVERY DAY Patient not taking: Reported on 03/16/2020 11/12/18   Lovena Neighbours, MD  metoprolol tartrate (LOPRESSOR) 50 MG tablet Take 2 hours prior to procedure Patient not taking: Reported on 03/16/2020 08/24/18   Filbert Schilder, NP   WAS NOT TAKING ANY MEDICATIONS AS OUTPT  Inpatient Medications: Scheduled Meds: . metoprolol tartrate  5 mg Intravenous Once   Continuous Infusions:  PRN Meds:   Allergies:   No Known Allergies  Social History:   Social History   Socioeconomic History  . Marital status: Single    Spouse name: Not on file  . Number of children: Not on file  . Years of education: Not on file  . Highest  education level: Not on file  Occupational History  . Not on file  Tobacco Use  . Smoking status: Former Smoker    Years: 15.00    Types: Cigars    Quit date: 07/14/2018    Years since quitting: 1.6  . Smokeless tobacco: Never Used  Vaping Use  . Vaping Use: Never used  Substance and Sexual Activity  . Alcohol use: Yes    Alcohol/week: 51.0 standard drinks    Types: 50 Shots of liquor, 1 Cans of beer per week  . Drug use: Yes    Types: Marijuana  . Sexual activity: Not on file  Other Topics Concern  . Not on file  Social History Narrative  . Not on file   Social Determinants of Health   Financial Resource Strain:   . Difficulty of Paying Living Expenses: Not on file  Food Insecurity:   . Worried About Programme researcher, broadcasting/film/video in the Last Year: Not on file  . Ran Out of Food in the Last Year: Not on file  Transportation Needs:   . Lack of Transportation (Medical): Not on file  . Lack of Transportation (Non-Medical): Not on file  Physical Activity:   . Days of Exercise per Week: Not on file  . Minutes of Exercise per Session: Not on file  Stress:   . Feeling of Stress : Not on file  Social Connections:   . Frequency of Communication with Friends and Family: Not on file  . Frequency of Social Gatherings with Friends and Family: Not on file  . Attends Religious Services: Not on file  . Active Member of Clubs or Organizations: Not on file  . Attends Banker Meetings: Not on file  . Marital Status: Not on file  Intimate Partner Violence:   . Fear of Current or Ex-Partner: Not on file  . Emotionally Abused: Not on file  . Physically Abused: Not on file  . Sexually Abused: Not on file    Family History:    Family History  Problem Relation Age of Onset  . Hypertension Father      ROS:  Please see the history of present illness.  General:no colds or fevers, no weight changes Skin:no rashes or ulcers HEENT:no blurred vision, no congestion CV:see HPI PUL:see  HPI GI:no diarrhea constipation or melena, no indigestion GU:no hematuria, no dysuria MS:no joint pain, no claudication, + edema Neuro:no syncope, no lightheadedness Endo:no diabetes, no thyroid disease  All other ROS reviewed and negative.     Physical Exam/Data:   Vitals:   03/16/20 0434 03/16/20 0643  BP: (!) 162/99 (!) 121/107  Pulse: 97   Resp: 20 20  Temp: 97.9 F (36.6 C) 98.7 F (  37.1 C)  TempSrc: Oral Oral  SpO2: 100%   Weight: 86.2 kg   Height: 6' (1.829 m)    No intake or output data in the 24 hours ending 03/16/20 1053 Last 3 Weights 03/16/2020 08/24/2018 08/14/2018  Weight (lbs) 190 lb 195 lb 3.2 oz 199 lb 4 oz  Weight (kg) 86.183 kg 88.542 kg 90.379 kg     Body mass index is 25.77 kg/m.  General:  Well nourished, well developed, in no acute distress HEENT: normal Lymph: no adenopathy Neck: mild JVD Endocrine:  No thryomegaly Vascular: No carotid bruits; pedal pulses 2+ bilaterally   Cardiac:  normal S1, S2; RRR; no murmur gallup rub or click Lungs:  Diminished and rales to auscultation bilaterally, no wheezing, rhonchi   Abd: soft, nontender, no hepatomegaly  Ext: 1+ edema feet and ankles Musculoskeletal:  No deformities, BUE and BLE strength normal and equal Skin: warm and dry  Neuro:  Alert and oriented X 3 MAE follows commands no focal abnormalities noted Psych:  Normal affect    Relevant CV Studies:  Echo 08/05/2018  IMPRESSIONS  1. The left ventricle has severely reduced systolic function, with an ejection fraction of 15-20%. The cavity size was moderately dilated. Left ventricular diastolic Doppler parameters are consistent with pseudonormalization. Left ventricular diffuse  hypokinesis with septal-lateral dyssynergy suggestive of nonischemic cardiomyopathy. No obvious LV mural thrombus visualized with Definity contrast. 2. The right ventricle has moderately reduced systolic function. The cavity was normal. There is no increase in right  ventricular wall thickness. Right ventricular systolic pressure could not be assessed. 3. Left atrial size was moderately dilated. 4. Small pericardial effusion. 5. The pericardial effusion is posterior to the left ventricle. 6. The mitral valve is normal in structure. 7. The tricuspid valve is normal in structure. 8. The aortic valve is tricuspid. 9. The aortic root is normal in size and structure.  Laboratory Data:  High Sensitivity Troponin:   Recent Labs  Lab 03/16/20 0436 03/16/20 0619  TROPONINIHS 53* 52*     Chemistry Recent Labs  Lab 03/16/20 0436  NA 135  K 4.1  CL 103  CO2 21*  GLUCOSE 141*  BUN 23*  CREATININE 1.63*  CALCIUM 9.1  GFRNONAA 50*  GFRAA 58*  ANIONGAP 11    Recent Labs  Lab 03/16/20 0800  PROT 6.0*  ALBUMIN 3.6  AST 33  ALT 39  ALKPHOS 49  BILITOT 1.3*   Hematology Recent Labs  Lab 03/16/20 0436  WBC 6.6  RBC 4.47  HGB 13.4  HCT 42.8  MCV 95.7  MCH 30.0  MCHC 31.3  RDW 12.1  PLT 276   BNP Recent Labs  Lab 03/16/20 0800  BNP 1,394.3*    DDimer No results for input(s): DDIMER in the last 168 hours.   Radiology/Studies:  DG Chest 2 View  Result Date: 03/16/2020 CLINICAL DATA:  Chest pain EXAM: CHEST - 2 VIEW COMPARISON:  08/04/2018 FINDINGS: Similar enlargement cardiopericardial silhouette. The lungs are clear without focal pneumonia, edema, pneumothorax or pleural effusion. The visualized bony structures of the thorax show no acute abnormality. IMPRESSION: Similar enlargement of the cardiopericardial silhouette. No acute findings. Electronically Signed   By: Kennith Center M.D.   On: 03/16/2020 05:05   CT Angio Chest PE W/Cm &/Or Wo Cm  Result Date: 03/16/2020 CLINICAL DATA:  Chest pain and nausea EXAM: CT ANGIOGRAPHY CHEST WITH CONTRAST TECHNIQUE: Multidetector CT imaging of the chest was performed using the standard protocol during bolus administration of intravenous  contrast. Multiplanar CT image reconstructions  and MIPs were obtained to evaluate the vascular anatomy. CONTRAST:  71mL OMNIPAQUE IOHEXOL 350 MG/ML SOLN COMPARISON:  March 16, 2020 FINDINGS: Cardiovascular: There is no demonstrable pulmonary embolus. There is no thoracic aortic aneurysm. No dissection evident; the contrast bolus in the aorta is not sufficient for potential dissection assessment. Visualized great vessels appear unremarkable on this study with limited contrast in the great vessels. There is no pericardial effusion or pericardial thickening evident. There is a degree of cardiomegaly. Mediastinum/Nodes: Thyroid appears unremarkable. There is no appreciable thoracic adenopathy. No esophageal lesions are evident. Lungs/Pleura: There are scattered areas of ill-defined airspace opacity in each upper lobe region. There is no consolidation on either side. There is mild scarring in the bases. No pleural effusions are evident. There is bronchiectatic change in each lower lobe. Equivocal bronchiectatic change noted in the upper lobes. A small bulla measuring 8 x 8 mm is noted in the right upper lobe. Upper Abdomen: There is pneumoperitoneum with a degree of upper abdominal ascites. Visualized upper abdominal structures otherwise appear unremarkable. Musculoskeletal: No blastic or lytic bone lesions. No chest wall lesions evident. Review of the MIP images confirms the above findings. IMPRESSION: 1. There is pneumoperitoneum with upper abdominal ascites. Etiology for this pneumoperitoneum uncertain. Question recent surgery as a potential etiology. If patient has not had recent surgical procedure involving the abdomen or pelvis, perforated viscus must be of concern based on this appearance. 2. No evident pulmonary embolus. No thoracic aortic aneurysm. No dissection evident; note that the contrast bolus in the aorta is not sufficient for dissection assessment. 3. Scattered areas of ill-defined airspace opacity in the upper lobes raises question for underlying  atypical organism pneumonia. Check of COVID-19 status advised in this regard. No consolidation or pleural effusions. 4. Lower lobe bronchiectatic change bilaterally. Scattered areas of lower lobe scarring. 5.  No evident adenopathy. 6.  Cardiomegaly. Critical Value/emergent results were called by telephone at the time of interpretation on 03/16/2020 at 8:48 am to provider [facility] , who verbally acknowledged these results. Electronically Signed   By: Bretta Bang III M.D.   On: 03/16/2020 08:48       HEAR Score (for undifferentiated chest pain):     4  New York Heart Association (NYHA) Functional Class NYHA Class II  Assessment and Plan:   1. Chest pain with mildly elevated troponin and with RVR A flutter, BB did not slow much now beginning IV amiodarone  - most of symptoms of SOB.  Would consider cardiac cath possibly tomorrow prior to discharge once CHF improved and HR improved 2. A flutter with RVR has re'c 5 mg IV BB now going to amiodarone.CHA2DS2VASc 2 with cardiomyopathy and HTN 3. Acute on chronic systolic HF will add IV lasix 40 BID for now.  Strict I&O, low salt diet. Was not on meds at home, will need to resume coreg at 3.125 BID 4. CM since 2020 was improved symptom wise 08/2018 have not done ischemic eval. Was for cardiac CTA but cancelled with COVID rules. Will repeat echo but would prefer to wait until HR controlled.  5. Tobacco and ETOH use, per IM, pt stopped again 2 weeks ago.        For questions or updates, please contact [facility] Please consult [email or web address] for contact info under    Signed, Nada Boozer, NP  03/16/2020 10:53 AM   Personally seen and examined. Agree with above.   Plan for TEE cardioversion, amio,  eliquis load. EF 15%. ETOH cessation. Discussed with patient and significant other.   Donato Schultz, MD

## 2020-03-16 NOTE — ED Notes (Signed)
Wasted 1mg  of Ativan with , Steward Drone.

## 2020-03-16 NOTE — ED Notes (Signed)
Pt moved to hospital bed.

## 2020-03-16 NOTE — Progress Notes (Signed)
ANTICOAGULATION CONSULT NOTE - Initial Consult  Pharmacy Consult for heparin Indication: atrial fibrillation  No Known Allergies  Patient Measurements: Height: 6' (182.9 cm) Weight: 86.2 kg (190 lb) IBW/kg (Calculated) : 77.6 Heparin Dosing Weight: 86.2kg  Vital Signs: Temp: 98.7 F (37.1 C) (10/04 0643) Temp Source: Oral (10/04 0643) BP: 122/103 (10/04 1118) Pulse Rate: 137 (10/04 1118)  Labs: Recent Labs    03/16/20 0436 03/16/20 0619  HGB 13.4  --   HCT 42.8  --   PLT 276  --   CREATININE 1.63*  --   TROPONINIHS 53* 52*    Estimated Creatinine Clearance: 62.2 mL/min (A) (by C-G formula based on SCr of 1.63 mg/dL (H)).   Medical History: Past Medical History:  Diagnosis Date   Alcohol abuse    Asthma    Cardiomyopathy (HCC)    Hypertension     Medications:  Infusions:   amiodarone     amiodarone     heparin      Assessment: 48 yom presented to the ED with CP found to be in aflutter. To start IV heparin. Baseline CBC is WNL. He is not on anticoagulation PTA.  Goal of Therapy:  Heparin level 0.3-0.7 units/ml Monitor platelets by anticoagulation protocol: Yes   Plan:  Heparin bolus 5000 units IV x 1 Heparin gtt 1200 units/hr Check a 6 hr heparin level Daily heparin level and CBC  Tacuma Graffam, Drake Leach 03/16/2020,12:07 PM

## 2020-03-16 NOTE — Progress Notes (Addendum)
FPTS Interim Progress Note  S: Interviewed patient bedside in ED.  He reports feeling ok. He reports only one additional episode of diarrhea and then having additional regular BM. He reports filling a urinal twice to around the same amount (approx 600 cc- total 1200 cc). He has no other complaints at the moment. Confirmed that the last time he had an alcoholic drink was last Monday 9/27.  While interviewing patient BP was 101/75 with a BP of approx 122.  O: BP (!) 78/66   Pulse (!) 125   Temp 98.7 F (37.1 C) (Oral)   Resp 18   Ht 6' (1.829 m)   Wt 86.2 kg   SpO2 98%   BMI 25.77 kg/m   Physical Exam Vitals and nursing note reviewed.  Constitutional:      General: He is not in acute distress.    Appearance: He is well-developed. He is ill-appearing. He is not toxic-appearing or diaphoretic.  HENT:     Head: Normocephalic and atraumatic.  Cardiovascular:     Rate and Rhythm: Tachycardia present.     Pulses: Normal pulses.          Radial pulses are 2+ on the right side and 2+ on the left side.       Dorsalis pedis pulses are 2+ on the right side and 2+ on the left side.     Heart sounds: Normal heart sounds, S1 normal and S2 normal. No murmur heard.   Pulmonary:     Effort: Pulmonary effort is normal. No respiratory distress.     Breath sounds: Normal breath sounds. No wheezing.  Abdominal:     General: There is no distension.     Palpations: There is no mass.     Tenderness: There is no abdominal tenderness.  Musculoskeletal:     Right lower leg: 1+ Pitting Edema present.     Left lower leg: 1+ Pitting Edema present.  Neurological:     Mental Status: He is alert. Mental status is at baseline.  Psychiatric:        Mood and Affect: Mood normal.        Behavior: Behavior normal.        Thought Content: Thought content normal.      A/P: Soft BP: While in the room BP was 101/75. Have ordered a bolus of 250 cc. Will continue to monitor and if BP continues to decrease  will consult cardiology and CCM. -Bolus 250 cc NS -Continue to monitor -Low threshold to consult Cards or CCM   Will continue rest of care plan as already outlined.   Lauro Franklin, MD 03/16/2020, 8:58 PM PGY-1, [facility] Service pager [phone]

## 2020-03-16 NOTE — ED Notes (Signed)
Pt stated he felt hot after azithromycin was started. MD was at the bedside and stated to stop med. Azithromycin was stopped. Patients vital signs remain the same.

## 2020-03-16 NOTE — ED Notes (Signed)
Pt BP seems to be dropping and MD at bedside asked for cardiology to be paged.

## 2020-03-16 NOTE — ED Triage Notes (Signed)
Pt has had chest pain x 3 days and tonight pain got worse. Pt said some nausea. Pain is on left side and does radiate into his back. Pt saying lying down makes the pain worse.

## 2020-03-16 NOTE — ED Notes (Signed)
Wasted 1mg  of ativan with , Steward Drone

## 2020-03-16 NOTE — ED Notes (Signed)
Pt given [place] sandwich and apple juice, ok by EDP

## 2020-03-16 NOTE — Progress Notes (Signed)
FPTS Interim Progress Note  Touched base with cardiology regarding patient's persistent tachycardia and low blood pressures. Recommended discontinuing the Coreg and giving small bolus of 250cc x 1. Plan to reassess after. If no improvement, consider additional small bolus and touching base with cardiology again.   Bryan [place], DO 03/16/2020, 8:07 PM PGY-3, [facility] Family Medicine Service pager [phone]

## 2020-03-16 NOTE — ED Notes (Signed)
Paged cardiology 

## 2020-03-16 NOTE — H&P (Addendum)
Family Medicine Teaching [facility] Admission History and Physical Service Pager: [phone]  Patient name: Bryan Guerrero Medical record number: [id number] Date of birth: 1971-03-19 Age: 49 y.o. Gender: male  Primary Care Provider: Reece Leader, DO Consultants: Cardiology  Code Status: Full Code Preferred Emergency Contact: [phone], Georgetta Haber, wife    Chief Complaint: chest pain and worsening dyspnea    Assessment and Plan: RANDALL COLDEN is a 49 y.o. male presenting with dyspnea and intermittent chest pain . PMH is significant for hypertension, alcoholic cardiomyopathy and congestive heart failure.   Acute Respiratory Failure  Endorses orthopnea, intermittent chest pain not associated with exertion and worsening dyspnea since 4-5 days ago. Patient reporting similar symptoms of chest pain and dyspnea when he was admitted and diagnosed with heart failure about 18 months.  Patient with hx of HFrEF.CXR demonstrated cardiomegaly without any acute findings.  BNP 1394.3.  Given that previous echo on 08/05/2018 demonstrated EF of 15-20% and clinical exam of edema and crackles heard diffusly, likely due to CHF that is causing worsening pulmonary edema. Significantly elevated BNP further supports this. Other possible causes include non-ischemic cardiomyopathy given extensive alcoholic history and MI, will continue to trend troponins but no ST elevations noted on initial EKG. Patient tachycardic with shortness of breath concerning for PE. CT angio chest demonstrated no evidence of PE or thoracic aortic aneurysm without dissection evidence but indicated scattered areas of lower lobe scarring. CT abdomen/pelvis demonstrated small amount of free fluid in the dependent portion of the pelvis of unknown etiology. CBC unremarkable. Elevated troponin 53>52 which trended flat. Pt reporting cough and shortness of breath. Pt afebrile without leukocytosis however CTA Chest with findings concerning for  atypical pneumonia. Patient's condition is likely further exacerbated by lack of appropriate medical follow up and noncompliance of medications. He will likely benefit from diuresis. We will await additional cardiology recommendations. -admit to cardiac tele, attending Dr. Pollie Meyer -cardiology consulted, continue to appreciate recs -s/p lopressor -IV amiodarone infusion -IV lasix 40 mg BID  -trend troponin  -repeat EKG -echo TEE -continuous cardiac monitoring -heart healthy diet -daily weights  -strict I/Os -f/u cardiology outpatient -SW to establish care with PCP, encourage adherence to prescribed medications    Acute on Chronic Systolic CHF  Alcoholic Non-Ischemic cardiomyopathy  Patient reporting worsening shortness of breath for the 5 days with LE edema, 2-3 pillow orthopnea and chest tightness.  In the ED patient's chest x-ray indicated cardiomegaly.  Patient received 40 mg of IV Lasix.  He is satting between 90-100% on room air. Most recently 98% O2 sat. BNP ~1400. On exam, bibasilar rales. Most recent echo in 2020 with EF of 15-20%, severely reduced (RV and LV) systolic function, moderately dilated LV, diffuse hypokinesis with septal-lateral dyssynergy suggestive of non-ischemic cardiomyopathy.  Patient has been without heart failure medication  - cardiology following, appreciate recommendations - follow TEE - start Lasix 40 mg IV, titrate depending on fluid status and creatinine - strict I's and O's  - daily weights - Coreg 3.125 BID   New Onset Atrial Flutter  Patient reporting chest pain and dyspnea. EKG concerning for A. flutter with RVR. HR 149. Patient given Lopressor in ED and then started on amiodarone gtt in the ED. Patient started on Eliquis in the ED. CHADS Vasc score of 2.  - cardiology following, appreciate recommendations  - Amiodarone gtt, discontinue when able  - Coreg 3.125 mg BID  - Eliquis 5 mg BID -TEE vs ECHO per cards   HTN  Patient was told at last  admission that he had hypertension but not currently on any home meds. Previously took Losartan.  Hypertensive on admission with BP 162/99.  -s/p lopressor -carvedilil 3.125 mg started  -continue to monitor BP    Alcohol Abuse  Patient drinks a fifth of liquor over a 3-4 day course along with 2-3 cans of beer daily. Has not had any alcohol since last week (Monday). Denies ever being hospitalized for alcohol withdrawal   -Monitor CIWA -multivitamin -thiamin daily -folic acid daily  -continued education  AKI Cr 1.63 on admission, baseline appears to be around 1.1-1.2. Likely contributing factor in the setting of untreated hypertension. -monitor Cr with BMP am  Hyperthyroidism TSH wnl and elevated T4 1.39. No prior diagnosis. -follow up with PCP   Tobacco Use Disorder: -Patient previously smoking 2 black-and-mild cigars a day. Offered nicotine patch however patient declined.  - Nicotine patch available upon request    FEN/GI: heart healthy diet  Prophylaxis: Eliquis   Disposition: admit to cardiac telemetry, attending Dr. Pollie Meyer   History of Present Illness:  Bryan Guerrero is a 49 y.o. male presenting with chest pain and dyspnea. Endorses that the dyspnea started about a month ago, according to wife, but has progressively worsened over the past 4-5 days. He admits to orthopnea that requires him to use 2-3 pillows when laying down. Noticed his feet began to swell yesterday. 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 Started having intermittent chest pain that is not necessarily associated with exertion. Describes these episodes as occurring both with activity and at rest where he experiences left-sided chest pain that does not radiate down the arm or to the jaw. He usually starts to move around and chest pain spontaneously resolves within about 3-5 minutes. Denies weakness, fever, blurred vision, headache, localized  or generalized pain and vomiting. Admits to occasional nausea but none currently.   Experienced similar symptoms about 18 months ago when was admitted and diagnosed with CHF, reports that this was his only hospitalization. He does not see a PCP regularly and does not follow up with a cardiologist. Stopped taking any medications he was prescribed with at this time in April 2020. Only medications include Primatene mist for shortness of breath and multivitamin. Denies any drug allergies. Denies any previous surgeries. Admits to tobacco use, marijuana use and alcohol use. Drinks about 1/5 of a liquor bottle over a 3-4 day period along with 2-3 beer cans daily. Last alcoholic beverage was a week ago. Denies ever having any withdrawal symptoms aside from cold sweats, denies anything currently. Last reported tobacco use is about 2-3 weeks ago. He has refused the nicotine patch.      Review Of Systems: Per HPI with the following additions:      Review of Systems  Constitutional: Positive for chills. Negative for fever.  HENT: Negative for sore throat.   Eyes: Negative for blurred vision.  Respiratory: Positive for cough and shortness of breath. Negative for sputum production.   Cardiovascular: Positive for chest pain, orthopnea and leg swelling.  Gastrointestinal: Positive for nausea. Negative for abdominal pain and vomiting.  Genitourinary: Negative for dysuria.  Musculoskeletal: Negative for joint pain.  Neurological: Negative for weakness and headaches.  Psychiatric/Behavioral: Positive for substance abuse.    Patient Active Problem List   Diagnosis Date Noted  . CHF (congestive heart failure) (HCC) 03/16/2020  . Chronic systolic congestive heart failure (HCC) 08/14/2018  .  Shortness of breath 08/04/2018    Past Medical History: Past Medical History:  Diagnosis Date  . Alcohol abuse   . Asthma   . Cardiomyopathy (HCC)   . Hypertension     Past Surgical History: History reviewed.  No pertinent surgical history.  Social History: Social History   Tobacco Use  . Smoking status: Former Smoker    Years: 15.00    Types: Cigars    Quit date: 07/14/2018    Years since quitting: 1.6  . Smokeless tobacco: Never Used  Vaping Use  . Vaping Use: Never used  Substance Use Topics  . Alcohol use: Yes    Alcohol/week: 51.0 standard drinks    Types: 50 Shots of liquor, 1 Cans of beer per week  . Drug use: Yes    Types: Marijuana    Please also refer to relevant sections of EMR.  Family History: Family History  Problem Relation Age of Onset  . Hypertension Father    No family hx of sudden cardiac death   Allergies and Medications: No Known Allergies No current facility-administered medications on file prior to encounter.   Current Outpatient Medications on File Prior to Encounter  Medication Sig Dispense Refill  . EPINEPHrine (PRIMATENE MIST) 0.125 MG/ACT AERO Inhale 1 puff into the lungs 3 (three) times daily as needed (shortness od breath).    . Multiple Vitamin (MULTIVITAMIN WITH MINERALS) TABS tablet Take 1 tablet by mouth daily.    . carvedilol (COREG) 3.125 MG tablet Take 1 tablet by mouth twice daily with food (Patient not taking: Reported on 03/16/2020) 60 tablet 0  . carvedilol (COREG) 6.25 MG tablet Take 1 tablet (6.25 mg total) by mouth 2 (two) times daily with a meal. (Patient not taking: Reported on 03/16/2020) 180 tablet 0  . furosemide (LASIX) 40 MG tablet TAKE 1 TABLET BY MOUTH EVERY DAY (Patient not taking: Reported on 03/16/2020) 30 tablet 0  . losartan (COZAAR) 25 MG tablet TAKE 1 TABLET BY MOUTH EVERY DAY (Patient not taking: Reported on 03/16/2020) 60 tablet 0  . metoprolol tartrate (LOPRESSOR) 50 MG tablet Take 2 hours prior to procedure (Patient not taking: Reported on 03/16/2020) 1 tablet 0    Objective: BP 95/79   Pulse (!) 135   Temp 98.7 F (37.1 C) (Oral)   Resp 20   Ht 6' (1.829 m)   Wt 86.2 kg   SpO2 98%   BMI 25.77 kg/m   Exam: General: Patient sitting upright in the bed, in no acute distress. Eyes: tracking appropriately Neck: no evidence of JVD, supple neck, no evidence of lymphadenopathy  Cardiovascular: RRR, no murmurs or gallops auscultated  Respiratory: faint diffuse crackles throughout all lung fields without focal findings, breathing comfortably on room air, no evidence of respiratory distress Gastrointestinal: soft, nontender, presence of active bowel sounds  MSK: 2+ pedal and pitting edema left LE, 1+ pitting edema right LE, no gross deformities, radial and distal pulses intact bilaterally  Derm: extremities cool to touch bilaterally, no rashes or lesions noted  Neuro: alert, conversational, follows commands appropriately  Psych: mood appropriate   Labs and Imaging: CBC BMET  Recent Labs  Lab 03/16/20 0436  WBC 6.6  HGB 13.4  HCT 42.8  PLT 276   Recent Labs  Lab 03/16/20 0436  NA 135  K 4.1  CL 103  CO2 21*  BUN 23*  CREATININE 1.63*  GLUCOSE 141*  CALCIUM 9.1     EKG: left ventricular hypertrophy with atrial flutter  CT Abdomen Pelvis Wo Contrast  Result Date: 03/16/2020 CLINICAL DATA:  Chest pain, pneumoperitoneum.  Nausea. EXAM: CT ABDOMEN AND PELVIS WITHOUT CONTRAST TECHNIQUE: Multidetector CT imaging of the abdomen and pelvis was performed following the standard protocol without IV contrast. COMPARISON:  CTA of same day. FINDINGS: Lower chest: No acute abnormality. Hepatobiliary: No focal liver abnormality is seen. No gallstones, gallbladder wall thickening, or biliary dilatation. Pancreas: Unremarkable. No pancreatic ductal dilatation or surrounding inflammatory changes. Spleen: Normal in size without focal abnormality. Adrenals/Urinary Tract: Adrenal glands are unremarkable. Kidneys are normal, without renal calculi, focal lesion, or hydronephrosis. Bladder is unremarkable. Stomach/Bowel: Stomach is within normal limits. Appendix appears normal. No evidence of bowel wall  thickening, distention, or inflammatory changes. That which was described as free air on prior CT exam of same day actually appears to represent a portion of colon that is interposed between the liver and diaphragm. There is no evidence of pneumoperitoneum. Vascular/Lymphatic: No significant vascular findings are present. No enlarged abdominal or pelvic lymph nodes. Reproductive: Prostate is unremarkable. Other: Small amount of free fluid is noted in the dependent portion of the pelvis of unknown etiology. No hernia is noted. Musculoskeletal: No acute or significant osseous findings. IMPRESSION: 1. Small amount of free fluid is noted in the dependent portion of the pelvis of unknown etiology. 2. The finding which was described as free air on prior CT exam of same day actually appears to represent a portion of colon that is interposed between the liver and diaphragm. There is no evidence of pneumoperitoneum. 3. No significant abnormality is noted in the abdomen or pelvis. Electronically Signed   By: Lupita Raider M.D.   On: 03/16/2020 10:56   DG Chest 2 View  Result Date: 03/16/2020 CLINICAL DATA:  Chest pain EXAM: CHEST - 2 VIEW COMPARISON:  08/04/2018 FINDINGS: Similar enlargement cardiopericardial silhouette. The lungs are clear without focal pneumonia, edema, pneumothorax or pleural effusion. The visualized bony structures of the thorax show no acute abnormality. IMPRESSION: Similar enlargement of the cardiopericardial silhouette. No acute findings. Electronically Signed   By: Kennith Center M.D.   On: 03/16/2020 05:05   CT Angio Chest PE W/Cm &/Or Wo Cm  Result Date: 03/16/2020 CLINICAL DATA:  Chest pain and nausea EXAM: CT ANGIOGRAPHY CHEST WITH CONTRAST TECHNIQUE: Multidetector CT imaging of the chest was performed using the standard protocol during bolus administration of intravenous contrast. Multiplanar CT image reconstructions and MIPs were obtained to evaluate the vascular anatomy. CONTRAST:   61mL OMNIPAQUE IOHEXOL 350 MG/ML SOLN COMPARISON:  March 16, 2020 FINDINGS: Cardiovascular: There is no demonstrable pulmonary embolus. There is no thoracic aortic aneurysm. No dissection evident; the contrast bolus in the aorta is not sufficient for potential dissection assessment. Visualized great vessels appear unremarkable on this study with limited contrast in the great vessels. There is no pericardial effusion or pericardial thickening evident. There is a degree of cardiomegaly. Mediastinum/Nodes: Thyroid appears unremarkable. There is no appreciable thoracic adenopathy. No esophageal lesions are evident. Lungs/Pleura: There are scattered areas of ill-defined airspace opacity in each upper lobe region. There is no consolidation on either side. There is mild scarring in the bases. No pleural effusions are evident. There is bronchiectatic change in each lower lobe. Equivocal bronchiectatic change noted in the upper lobes. A small bulla measuring 8 x 8 mm is noted in the right upper lobe. Upper Abdomen: There is pneumoperitoneum with a degree of upper abdominal ascites. Visualized upper abdominal structures otherwise appear unremarkable. Musculoskeletal: No blastic  or lytic bone lesions. No chest wall lesions evident. Review of the MIP images confirms the above findings. IMPRESSION: 1. There is pneumoperitoneum with upper abdominal ascites. Etiology for this pneumoperitoneum uncertain. Question recent surgery as a potential etiology. If patient has not had recent surgical procedure involving the abdomen or pelvis, perforated viscus must be of concern based on this appearance. 2. No evident pulmonary embolus. No thoracic aortic aneurysm. No dissection evident; note that the contrast bolus in the aorta is not sufficient for dissection assessment. 3. Scattered areas of ill-defined airspace opacity in the upper lobes raises question for underlying atypical organism pneumonia. Check of COVID-19 status advised in this  regard. No consolidation or pleural effusions. 4. Lower lobe bronchiectatic change bilaterally. Scattered areas of lower lobe scarring. 5.  No evident adenopathy. 6.  Cardiomegaly. Critical Value/emergent results were called by telephone at the time of interpretation on 03/16/2020 at 8:48 am to provider [facility] , who verbally acknowledged these results. Electronically Signed   By: Bretta Bang III M.D.   On: 03/16/2020 08:48    Reece Leader, DO 03/16/2020, 2:33 PM PGY-1, [place] FPTS Intern pager: [phone], text pages welcome  FPTS Upper-Level Resident Addendum   I have independently interviewed and examined the patient. I have discussed the above with the original author and agree with their documentation. My edits for correction/addition/clarification are in blue Please see also any attending notes.   Katha Cabal, DO PGY-2, [facility] 03/16/2020 6:17 PM  FPTS Service pager: [phone] (text pages welcome through [employer])

## 2020-03-16 NOTE — Anesthesia Preprocedure Evaluation (Addendum)
Anesthesia Evaluation  Patient identified by MRN, date of birth, ID band Patient awake    Reviewed: Allergy & Precautions, NPO status , Patient's Chart, lab work & pertinent test results, reviewed documented beta blocker date and time   Airway Mallampati: II  TM Distance: >3 FB Neck ROM: Full    Dental  (+) Poor Dentition, Missing, Dental Advisory Given   Pulmonary shortness of breath and with exertion, asthma , former smoker,    Pulmonary exam normal breath sounds clear to auscultation       Cardiovascular hypertension, Pt. on medications and Pt. on home beta blockers +CHF   Rhythm:Regular Rate:Tachycardia  Echo 08/05/18 1. The left ventricle has severely reduced systolic function, with andejection fraction of 15-20%. The cavity size was moderately dilated. Left ventricular diastolic Doppler parameters are consistent with  pseudonormalization. Left ventricular diffuse  hypokinesis with septal-lateral dyssynergy suggestive of nonischemic cardiomyopathy. No obvious LV mural thrombus visualized with Definity contrast.  2. The right ventricle has moderately reduced systolic function. The  cavity was normal. There is no increase in right ventricular wall thickness. Right ventricular systolic pressure could not be assessed.  3. Left atrial size was moderately dilated.  4. Small pericardial effusion.  5. The pericardial effusion is posterior to the left ventricle.  6. The mitral valve is normal in structure.  7. The tricuspid valve is normal in structure.  8. The aortic valve is tricuspid.  9. The aortic root is normal in size and structure  EKG 03/16/20 Atrial fibrillation with RVR 140/min, LVH with ischemic changes, LAD   Neuro/Psych PSYCHIATRIC DISORDERS negative neurological ROS     GI/Hepatic negative GI ROS, (+)     substance abuse  alcohol use,   Endo/Other  negative endocrine ROS  Renal/GU negative Renal ROS   negative genitourinary   Musculoskeletal negative musculoskeletal ROS (+)   Abdominal   Peds  Hematology Eliquis therapy- last dose yesterday pm   Anesthesia Other Findings   Reproductive/Obstetrics                            Anesthesia Physical Anesthesia Plan  ASA: III  Anesthesia Plan: General   Post-op Pain Management:    Induction: Intravenous  PONV Risk Score and Plan: 3 and Treatment may vary due to age or medical condition, Ondansetron and Propofol infusion  Airway Management Planned: Natural Airway, Nasal Cannula and Mask  Additional Equipment:   Intra-op Plan:   Post-operative Plan:   Informed Consent: I have reviewed the patients History and Physical, chart, labs and discussed the procedure including the risks, benefits and alternatives for the proposed anesthesia with the patient or authorized representative who has indicated his/her understanding and acceptance.     Dental advisory given  Plan Discussed with: CRNA and Anesthesiologist  Anesthesia Plan Comments:        Anesthesia Quick Evaluation

## 2020-03-16 NOTE — Progress Notes (Signed)
Transitions of Care Pharmacist Note  Bryan Guerrero is a 49 y.o. male that has been diagnosed with A Fib and will be prescribed Eliquis (apixaban) at discharge.   Patient Education: I provided the following education on 10/4 to the patient: How to take the medication Described what the medication is Signs of bleeding Answered their questions  Discharge Medications Plan: The patient wants to have their discharge medications filled by the Transitions of Care pharmacy rather than their usual pharmacy.  The discharge orders pharmacy has been changed to the Transitions of Care pharmacy, the patient will receive a phone call regarding co-pay, and their medications will be delivered by the Transitions of Care pharmacy.    Thank you,   Rexford Maus, PharmD PGY-1 Acute Care Pharmacy Resident 03/16/2020 5:26 PM

## 2020-03-17 ENCOUNTER — Inpatient Hospital Stay (HOSPITAL_COMMUNITY): Payer: Self-pay | Admitting: Anesthesiology

## 2020-03-17 ENCOUNTER — Inpatient Hospital Stay (HOSPITAL_COMMUNITY): Payer: Self-pay

## 2020-03-17 ENCOUNTER — Encounter (HOSPITAL_COMMUNITY): Payer: Self-pay | Admitting: Family Medicine

## 2020-03-17 ENCOUNTER — Encounter (HOSPITAL_COMMUNITY)
Admission: EM | Disposition: A | Discharge status: Home / Self Care | Payer: Self-pay | Source: Home / Self Care | Attending: Family Medicine

## 2020-03-17 DIAGNOSIS — I361 Nonrheumatic tricuspid (valve) insufficiency: Secondary | ICD-10-CM

## 2020-03-17 DIAGNOSIS — I340 Nonrheumatic mitral (valve) insufficiency: Secondary | ICD-10-CM

## 2020-03-17 DIAGNOSIS — I483 Typical atrial flutter: Secondary | ICD-10-CM

## 2020-03-17 DIAGNOSIS — I426 Alcoholic cardiomyopathy: Secondary | ICD-10-CM

## 2020-03-17 DIAGNOSIS — I4892 Unspecified atrial flutter: Secondary | ICD-10-CM

## 2020-03-17 HISTORY — PX: CARDIOVERSION: SHX1299

## 2020-03-17 HISTORY — PX: TEE WITHOUT CARDIOVERSION: SHX5443

## 2020-03-17 LAB — ECHOCARDIOGRAM COMPLETE
Area-P 1/2: 6.32 cm2
Calc EF: 11.6 %
Height: 72 in
MV M vel: 3.57 m/s
MV Peak grad: 51 mmHg
Single Plane A2C EF: 8.4 %
Single Plane A4C EF: 14.3 %
Weight: 3040 oz

## 2020-03-17 LAB — CBC
HCT: 44.7 % (ref 39.0–52.0)
Hemoglobin: 13.9 g/dL (ref 13.0–17.0)
MCH: 30.2 pg (ref 26.0–34.0)
MCHC: 31.1 g/dL (ref 30.0–36.0)
MCV: 97.2 fL (ref 80.0–100.0)
Platelets: 265 10*3/uL (ref 150–400)
RBC: 4.6 MIL/uL (ref 4.22–5.81)
RDW: 12 % (ref 11.5–15.5)
WBC: 9.3 10*3/uL (ref 4.0–10.5)
nRBC: 0 % (ref 0.0–0.2)

## 2020-03-17 LAB — BASIC METABOLIC PANEL
Anion gap: 19 — ABNORMAL HIGH (ref 5–15)
BUN: 35 mg/dL — ABNORMAL HIGH (ref 6–20)
CO2: 17 mmol/L — ABNORMAL LOW (ref 22–32)
Calcium: 9.4 mg/dL (ref 8.9–10.3)
Chloride: 99 mmol/L (ref 98–111)
Creatinine, Ser: 2.9 mg/dL — ABNORMAL HIGH (ref 0.61–1.24)
GFR calc non Af Amer: 25 mL/min — ABNORMAL LOW (ref >60)
Glucose, Bld: 125 mg/dL — ABNORMAL HIGH (ref 70–99)
Potassium: 5.2 mmol/L — ABNORMAL HIGH (ref 3.5–5.1)
Sodium: 135 mmol/L (ref 135–145)

## 2020-03-17 LAB — ECHO TEE
MV M vel: 2.87 m/s
MV Peak grad: 32.9 mmHg
Radius: 0.9 cm

## 2020-03-17 LAB — MAGNESIUM: Magnesium: 2.7 mg/dL — ABNORMAL HIGH (ref 1.7–2.4)

## 2020-03-17 SURGERY — ECHOCARDIOGRAM, TRANSESOPHAGEAL
Anesthesia: General

## 2020-03-17 MED ORDER — BUTAMBEN-TETRACAINE-BENZOCAINE 2-2-14 % EX AERO
INHALATION_SPRAY | CUTANEOUS | Status: DC | PRN
  Administered 2020-03-17: 2 via TOPICAL

## 2020-03-17 MED ORDER — LACTATED RINGERS IV SOLN
INTRAVENOUS | Status: AC | PRN
  Administered 2020-03-17: 1000 mL via INTRAVENOUS

## 2020-03-17 MED ORDER — PHENYLEPHRINE 40 MCG/ML (10ML) SYRINGE FOR IV PUSH (FOR BLOOD PRESSURE SUPPORT)
PREFILLED_SYRINGE | INTRAVENOUS | Status: DC | PRN
  Administered 2020-03-17 (×2): 200 ug via INTRAVENOUS
  Administered 2020-03-17: 280 ug via INTRAVENOUS

## 2020-03-17 MED ORDER — PROPOFOL 10 MG/ML IV BOLUS
INTRAVENOUS | Status: DC | PRN
  Administered 2020-03-17: 40 mg via INTRAVENOUS

## 2020-03-17 MED ORDER — FUROSEMIDE 10 MG/ML IJ SOLN: 40.0000 mg | Freq: Once | INTRAMUSCULAR | Status: DC

## 2020-03-17 MED ORDER — PERFLUTREN LIPID MICROSPHERE
1.0000 mL | INTRAVENOUS | Status: AC | PRN
  Administered 2020-03-17: 2 mL via INTRAVENOUS
  Filled 2020-03-17: qty 10

## 2020-03-17 MED ORDER — PHENYLEPHRINE HCL-NACL 10-0.9 MG/250ML-% IV SOLN
INTRAVENOUS | Status: DC | PRN
  Administered 2020-03-17: 75 ug/min via INTRAVENOUS

## 2020-03-17 MED ORDER — PROPOFOL 500 MG/50ML IV EMUL
INTRAVENOUS | Status: DC | PRN
  Administered 2020-03-17: 100 ug/kg/min via INTRAVENOUS

## 2020-03-17 MED ORDER — DEXMEDETOMIDINE (PRECEDEX) IN NS 20 MCG/5ML (4 MCG/ML) IV SYRINGE
PREFILLED_SYRINGE | INTRAVENOUS | Status: DC | PRN
  Administered 2020-03-17: 8 ug via INTRAVENOUS

## 2020-03-17 MED ORDER — AMIODARONE HCL 200 MG PO TABS
200.0000 mg | ORAL_TABLET | Freq: Two times a day (BID) | ORAL | Status: DC
  Administered 2020-03-17 – 2020-03-18 (×4): 200 mg via ORAL
  Filled 2020-03-17 (×4): qty 1

## 2020-03-17 NOTE — Progress Notes (Signed)
Left a voicemail on the spouse's Joni Reining cell phone to call the unit back in regards to finding the patient's belonging.

## 2020-03-17 NOTE — Progress Notes (Signed)
Received referral for Eliquis benefits check- pt is un-insured at time and has no drug benefits. His cost would be out of pocket. He could receive the first 30 days free with the drug company's free 30 day card- however after that would need to apply for pt assistance to see if he would qualify for continued support for drug.

## 2020-03-17 NOTE — Transfer of Care (Signed)
Immediate Anesthesia Transfer of Care Note  Patient: Bryan Guerrero  Procedure(s) Performed: TRANSESOPHAGEAL ECHOCARDIOGRAM (TEE) (N/A ) CARDIOVERSION (N/A )  Patient Location: Endoscopy Unit  Anesthesia Type:MAC  Level of Consciousness: sedated  Airway & Oxygen Therapy: Patient Spontanous Breathing, Patient connected to face mask oxygen and oral airway  Post-op Assessment: Report given to RN and Post -op Vital signs reviewed and stable  Post vital signs: Reviewed and stable  Last Vitals:  Vitals Value Taken Time  BP 81/41 03/17/20 0823  Temp    Pulse 91 03/17/20 0825  Resp 18 03/17/20 0825  SpO2 93 % 03/17/20 0825  Vitals shown include unvalidated device data.  Last Pain:  Vitals:   03/17/20 0705  TempSrc: Temporal  PainSc: 0-No pain         Complications: No complications documented.

## 2020-03-17 NOTE — ED Notes (Signed)
Paged Dr Robyne Peers to RN [facility]

## 2020-03-17 NOTE — Progress Notes (Signed)
°  Echocardiogram 2D Echocardiogram has been performed.  Bryan Guerrero 03/17/2020, 2:56 PM

## 2020-03-17 NOTE — Progress Notes (Signed)
Pt resting on right side, bp cuff readjusted, see flowsheet for vs, Dr Jens Som and crna aware of decreased bp, informed pt has bed on 3E, asked to monitor pt until sbp in 80's before transferring to floor by Dr Jens Som, iv amiodarone continues infusing at 16.33ml/hr=30mg /hour, safety maintained

## 2020-03-17 NOTE — Progress Notes (Signed)
Echocardiogram 2D Echocardiogram has been performed.  Bryan Guerrero Bryan Guerrero 03/17/2020, 8:34 AM

## 2020-03-17 NOTE — ED Notes (Signed)
Pt called out for sudden onset of SOB, pt sitting up in chair in the tripod position, no distress noted, pt denies CP at this time, pt states that he feels weak but does not want to lay down because the SOB is worse. EKG done and admitting paged.

## 2020-03-17 NOTE — Progress Notes (Signed)
   03/17/20 1000  Assess: MEWS Score  Temp (!) 97.3 F (36.3 C)  BP 92/80  Pulse Rate 89  ECG Heart Rate 89  Resp 16  Level of Consciousness Alert  SpO2 100 %  O2 Device Nasal Cannula  Patient Activity (if Appropriate) In bed  O2 Flow Rate (L/min) 1 L/min  Assess: MEWS Score  MEWS Temp 0  MEWS Systolic 1  MEWS Pulse 0  MEWS RR 0  MEWS LOC 0  MEWS Score 1  MEWS Score Color Green  Assess: if the MEWS score is Yellow or Red  Were vital signs taken at a resting state? Yes  Focused Assessment No change from prior assessment  Early Detection of Sepsis Score *See Row Information* Low  MEWS guidelines implemented *See Row Information* No, vital signs rechecked  Treat  MEWS Interventions Other (Comment) (Np is in the room. Patient came from ENDO)  Pain Scale 0-10  Pain Score 0     Patient arrived onto the unit from ENDO with a soft BP and a low temp - given report from ENDO RN. MD is aware. Will continue to monitor for changes.

## 2020-03-17 NOTE — Anesthesia Postprocedure Evaluation (Signed)
Anesthesia Post Note  Patient: Bryan Guerrero  Procedure(s) Performed: TRANSESOPHAGEAL ECHOCARDIOGRAM (TEE) (N/A ) CARDIOVERSION (N/A )     Patient location during evaluation: PACU Anesthesia Type: General Level of consciousness: awake and alert and oriented Pain management: pain level controlled Vital Signs Assessment: post-procedure vital signs reviewed and stable Respiratory status: spontaneous breathing, nonlabored ventilation, respiratory function stable and patient connected to nasal cannula oxygen Cardiovascular status: blood pressure returned to baseline, stable and unstable Postop Assessment: no apparent nausea or vomiting Anesthetic complications: no   No complications documented.  Last Vitals:  Vitals:   03/17/20 0900 03/17/20 0905  BP: (!) 74/48 (!) 82/45  Pulse: 86 87  Resp: 16 20  Temp:    SpO2: 97% 97%    Last Pain:  Vitals:   03/17/20 0905  TempSrc:   PainSc: 0-No pain                 Desree Leap A.

## 2020-03-17 NOTE — Progress Notes (Signed)
This afternoon BP improved pt only complains of fatigue.  His Cr  Is 2.90 ( he did receive contrast with CTA yesterday)  No lasix today with hypotension and elevated Cr. concern is his HF, in SR he may improve but if HF increases would consider PICC with CoOx and possible milrinone.    Will add po amiodarone - pt does not do well out of SR.   Discussed with Dr. Rennis Golden.

## 2020-03-17 NOTE — Progress Notes (Addendum)
Progress Note  Patient Name: Bryan Guerrero Date of Encounter: 03/17/2020  [facility] Cardiologist: Thurmon Fair, MD   Subjective   Still sleepy from DCCV,  Does not believe he put out much urine.  BMP ordered not yet done.  SOB improved.   Inpatient Medications    Scheduled Meds: . apixaban  5 mg Oral BID  . folic acid  1 mg Oral Daily  . furosemide  40 mg Intravenous BID  . multivitamin with minerals  1 tablet Oral Daily  . sodium chloride flush  3 mL Intravenous Q12H  . thiamine  100 mg Oral Daily   Continuous Infusions: . sodium chloride    . amiodarone 30 mg/hr (03/17/20 0737)   PRN Meds: sodium chloride, acetaminophen, ondansetron (ZOFRAN) IV, sodium chloride flush   Vital Signs    Vitals:   03/17/20 0900 03/17/20 0905 03/17/20 0916 03/17/20 0929  BP: (!) 74/48 (!) 82/45 (!) 79/54 (!) 88/73  Pulse: 86 87 88 88  Resp: 16 20 16 17   Temp:      TempSrc:      SpO2: 97% 97% 95% 95%  Weight:      Height:        Intake/Output Summary (Last 24 hours) at 03/17/2020 0949 Last data filed at 03/17/2020 0824 Gross per 24 hour  Intake 864.85 ml  Output --  Net 864.85 ml   Last 3 Weights 03/16/2020 08/24/2018 08/14/2018  Weight (lbs) 190 lb 195 lb 3.2 oz 199 lb 4 oz  Weight (kg) 86.183 kg 88.542 kg 90.379 kg      Telemetry    Now SR  - Personally Reviewed  ECG    SR with LVH, Twave inversions in Lat leads  - Personally Reviewed  Physical Exam   GEN: No acute distress.   Neck: mild JVD Cardiac: RRR, no murmurs, rubs, or gallops.  Respiratory: diminished breath sounds throughout  to auscultation bilaterally. GI: Soft, nontender, non-distended  MS: No edema; No deformity. extremities cool to cold but trunk is warm. Neuro:  Nonfocal  Psych: Normal affect   Labs    High Sensitivity Troponin:   Recent Labs  Lab 03/16/20 0436 03/16/20 0619  TROPONINIHS 53* 52*      Chemistry Recent Labs  Lab 03/16/20 0436 03/16/20 0800  NA 135  --   K 4.1   --   CL 103  --   CO2 21*  --   GLUCOSE 141*  --   BUN 23*  --   CREATININE 1.63*  --   CALCIUM 9.1  --   PROT  --  6.0*  ALBUMIN  --  3.6  AST  --  33  ALT  --  39  ALKPHOS  --  49  BILITOT  --  1.3*  GFRNONAA 50*  --   GFRAA 58*  --   ANIONGAP 11  --      Hematology Recent Labs  Lab 03/16/20 0436  WBC 6.6  RBC 4.47  HGB 13.4  HCT 42.8  MCV 95.7  MCH 30.0  MCHC 31.3  RDW 12.1  PLT 276    BNP Recent Labs  Lab 03/16/20 0800  BNP 1,394.3*     DDimer No results for input(s): DDIMER in the last 168 hours.   Radiology    CT Abdomen Pelvis Wo Contrast  Result Date: 03/16/2020 CLINICAL DATA:  Chest pain, pneumoperitoneum.  Nausea. EXAM: CT ABDOMEN AND PELVIS WITHOUT CONTRAST TECHNIQUE: Multidetector CT imaging of the abdomen  and pelvis was performed following the standard protocol without IV contrast. COMPARISON:  CTA of same day. FINDINGS: Lower chest: No acute abnormality. Hepatobiliary: No focal liver abnormality is seen. No gallstones, gallbladder wall thickening, or biliary dilatation. Pancreas: Unremarkable. No pancreatic ductal dilatation or surrounding inflammatory changes. Spleen: Normal in size without focal abnormality. Adrenals/Urinary Tract: Adrenal glands are unremarkable. Kidneys are normal, without renal calculi, focal lesion, or hydronephrosis. Bladder is unremarkable. Stomach/Bowel: Stomach is within normal limits. Appendix appears normal. No evidence of bowel wall thickening, distention, or inflammatory changes. That which was described as free air on prior CT exam of same day actually appears to represent a portion of colon that is interposed between the liver and diaphragm. There is no evidence of pneumoperitoneum. Vascular/Lymphatic: No significant vascular findings are present. No enlarged abdominal or pelvic lymph nodes. Reproductive: Prostate is unremarkable. Other: Small amount of free fluid is noted in the dependent portion of the pelvis of unknown  etiology. No hernia is noted. Musculoskeletal: No acute or significant osseous findings. IMPRESSION: 1. Small amount of free fluid is noted in the dependent portion of the pelvis of unknown etiology. 2. The finding which was described as free air on prior CT exam of same day actually appears to represent a portion of colon that is interposed between the liver and diaphragm. There is no evidence of pneumoperitoneum. 3. No significant abnormality is noted in the abdomen or pelvis. Electronically Signed   By: Lupita Raider M.D.   On: 03/16/2020 10:56   DG Chest 2 View  Result Date: 03/16/2020 CLINICAL DATA:  Chest pain EXAM: CHEST - 2 VIEW COMPARISON:  08/04/2018 FINDINGS: Similar enlargement cardiopericardial silhouette. The lungs are clear without focal pneumonia, edema, pneumothorax or pleural effusion. The visualized bony structures of the thorax show no acute abnormality. IMPRESSION: Similar enlargement of the cardiopericardial silhouette. No acute findings. Electronically Signed   By: Kennith Center M.D.   On: 03/16/2020 05:05   CT Angio Chest PE W/Cm &/Or Wo Cm  Result Date: 03/16/2020 CLINICAL DATA:  Chest pain and nausea EXAM: CT ANGIOGRAPHY CHEST WITH CONTRAST TECHNIQUE: Multidetector CT imaging of the chest was performed using the standard protocol during bolus administration of intravenous contrast. Multiplanar CT image reconstructions and MIPs were obtained to evaluate the vascular anatomy. CONTRAST:  75mL OMNIPAQUE IOHEXOL 350 MG/ML SOLN COMPARISON:  March 16, 2020 FINDINGS: Cardiovascular: There is no demonstrable pulmonary embolus. There is no thoracic aortic aneurysm. No dissection evident; the contrast bolus in the aorta is not sufficient for potential dissection assessment. Visualized great vessels appear unremarkable on this study with limited contrast in the great vessels. There is no pericardial effusion or pericardial thickening evident. There is a degree of cardiomegaly.  Mediastinum/Nodes: Thyroid appears unremarkable. There is no appreciable thoracic adenopathy. No esophageal lesions are evident. Lungs/Pleura: There are scattered areas of ill-defined airspace opacity in each upper lobe region. There is no consolidation on either side. There is mild scarring in the bases. No pleural effusions are evident. There is bronchiectatic change in each lower lobe. Equivocal bronchiectatic change noted in the upper lobes. A small bulla measuring 8 x 8 mm is noted in the right upper lobe. Upper Abdomen: There is pneumoperitoneum with a degree of upper abdominal ascites. Visualized upper abdominal structures otherwise appear unremarkable. Musculoskeletal: No blastic or lytic bone lesions. No chest wall lesions evident. Review of the MIP images confirms the above findings. IMPRESSION: 1. There is pneumoperitoneum with upper abdominal ascites. Etiology for this pneumoperitoneum  uncertain. Question recent surgery as a potential etiology. If patient has not had recent surgical procedure involving the abdomen or pelvis, perforated viscus must be of concern based on this appearance. 2. No evident pulmonary embolus. No thoracic aortic aneurysm. No dissection evident; note that the contrast bolus in the aorta is not sufficient for dissection assessment. 3. Scattered areas of ill-defined airspace opacity in the upper lobes raises question for underlying atypical organism pneumonia. Check of COVID-19 status advised in this regard. No consolidation or pleural effusions. 4. Lower lobe bronchiectatic change bilaterally. Scattered areas of lower lobe scarring. 5.  No evident adenopathy. 6.  Cardiomegaly. Critical Value/emergent results were called by telephone at the time of interpretation on 03/16/2020 at 8:48 am to provider [facility] , who verbally acknowledged these results. Electronically Signed   By: Bretta Bang III M.D.   On: 03/16/2020 08:48   ECHO TEE  Result Date: 03/17/2020     TRANSESOPHOGEAL ECHO REPORT   Patient Name:   Bryan Guerrero Date of Exam: 03/17/2020 Medical Rec #:  [id number]       Height:       72.0 in Accession #:    [id number]      Weight:       190.0 lb Date of Birth:  09-30-1970      BSA:          2.085 m Patient Age:    48 years        BP:           111/95 mmHg Patient Gender: M               HR:           114 bpm. Exam Location:  Inpatient Procedure: Transesophageal Echo, Color Doppler and Cardiac Doppler Indications:     I48.92* Unspecified atrial flutter  History:         Patient has prior history of Echocardiogram examinations, most                  recent 08/05/2018. CHF, Arrythmias:Atrial Flutter; Risk                  Factors:Hypertension.  Sonographer:     Irving Burton Senior RDCS Referring Phys:  [id number] LAURA R INGOLD Diagnosing Phys: [name] MD PROCEDURE: After discussion of the risks and benefits of a TEE, an informed consent was obtained from the patient. The transesophogeal probe was passed without difficulty through the esophogus of the patient. Local oropharyngeal anesthetic was provided with Cetacaine. Sedation performed by different physician. The patient was monitored while under deep sedation. Anesthestetic sedation was provided intravenously by Anesthesiology: 206mg  of Propofol. The patient developed no complications during the procedure. A successful direct current cardioversion was performed at 120 joules with 1 attempt. IMPRESSIONS  1. Pt subsequently underwent synchronized DCCV with 120J to sinus rhythm; continue apixaban.  2. Left ventricular ejection fraction, by estimation, is <20%. The left ventricle has severely decreased function. The left ventricular internal cavity size was severely dilated.  3. Right ventricular systolic function is severely reduced. The right ventricular size is mildly enlarged.  4. Left atrial size was severely dilated. No left atrial/left atrial appendage thrombus was detected.  5. Right atrial size was mildly dilated.  6.  The mitral valve is abnormal. Severe mitral valve regurgitation.  7. Tricuspid valve regurgitation is moderate.  8. The aortic valve is tricuspid. Aortic valve regurgitation is not visualized.  9. There is mild (Grade  II) plaque involving the descending aorta. FINDINGS  Left Ventricle: Left ventricular ejection fraction, by estimation, is <20%. The left ventricle has severely decreased function. The left ventricular internal cavity size was severely dilated. Right Ventricle: The right ventricular size is mildly enlarged. Right vetricular wall thickness was not assessed. Right ventricular systolic function is severely reduced. Left Atrium: Left atrial size was severely dilated. Spontaneous echo contrast was present in the left atrial appendage. No left atrial/left atrial appendage thrombus was detected. Right Atrium: Right atrial size was mildly dilated. Pericardium: Trivial pericardial effusion is present. Mitral Valve: The mitral valve is abnormal. There is mild thickening of the mitral valve leaflet(s). Severe mitral valve regurgitation. Tricuspid Valve: The tricuspid valve is normal in structure. Tricuspid valve regurgitation is moderate. Aortic Valve: The aortic valve is tricuspid. Aortic valve regurgitation is not visualized. Pulmonic Valve: The pulmonic valve was normal in structure. Pulmonic valve regurgitation is not visualized. Aorta: The aortic root is normal in size and structure. There is mild (Grade II) plaque involving the descending aorta. IAS/Shunts: No atrial level shunt detected by color flow Doppler. Additional Comments: Pt subsequently underwent synchronized DCCV with 120J to sinus rhythm; continue apixaban.  MR Peak grad:    32.9 mmHg MR Mean grad:    18.0 mmHg MR Vmax:         287.00 cm/s MR Vmean:        187.0 cm/s MR PISA:         5.09 cm MR PISA Eff ROA: 68 mm MR PISA Radius:  0.90 cm [name] MD Electronically signed by [name] MD Signature Date/Time: 03/17/2020/8:57:40 AM     Final     Cardiac Studies   TEE and DCCV 03/17/20 Procedure: Transesophageal Echocardiogram Indications: Atrial flutter  Procedure Details Consent: Obtained Time Out: Verified patient identification, verified procedure, site/side was marked, verified correct patient position, special equipment/implants available, Radiology Safety Procedures followed,  medications/allergies/relevent history reviewed, required imaging and test results available.  Performed  Medications:  Pt sedated by anesthesia with lidocaine 50 mg and diprovan 248 mg IV total. Also treated with precedex 8 ug.   Severe global LV dysfunction; severe RV dysfunction; 4 chamber enlargement; spontaneous contrast LAA but no thrombus; severe MR; moderate TR.  Subsequently underwent synchronized DCCV with 120J to sinus rhythm; no immediate complications; continue apixaban.    Complications: No apparent complications Patient did tolerate procedure well.  Patient Profile     49 y.o. male  with a hx of NICM from 07/2018 and no follow-up, EF 15-20%, no diuretics recently, alcohol abuse, HTN and systolic HF  now admitted with a. Flutter and acute on chronic systolic HF.    Assessment & Plan    1. Chest pain with mildly elevated troponin and with RVR A flutter, BB did not slow much now beginning IV amiodarone  - most of symptoms of SOB.  began eliquis yesterday  2. A flutter with RVR has re'c 5 mg IV BB now going to amiodarone.CHA2DS2VASc 2 with cardiomyopathy and HTN 3. Acute on chronic systolic HF will add IV lasix 40 BID for now.  Strict I&O, low salt diet. Was not on meds at home, will need to resume coreg at 3.125 BID no I&Os done  On lasix 40 IV BID  ? Low output syndrome  4. CM EF since 2020 (now EF <20%)  was improved symptom wise 08/2018 have not done ischemic eval. Was for cardiac CTA but cancelled with COVID rules. Will repeat echo were  waiting until HR controlled. Will plan for echo today and plan cardiac cath in  3-4 months after anticoagulation. --begin entresto and begin coreg at 3.125 BID once BP improves currently 74 to 88/73   G2DD  5. Pt with RV dysfunction as well.  And RV is mildly reduced.   6. Severe MR on TEE and moderate TR   7. Tobacco and ETOH use, per IM, pt stopped again 2 weeks ago.            For questions or updates, please contact [facility] Please consult [email or web address] for contact info under        Signed, Nada Boozer, NP  03/17/2020, 9:49 AM    Personally seen and examined. Agree with above.  Feels tired post cardioversion.  No real shortness of breath. Discussed with nursing in room.  He is off of oxygen. Lungs sounded clear.  Heart regular rate and rhythm.  Telemetry confirms sinus rhythm.  Dilated cardiomyopathy -Unknown etiology.  Possibly EtOH -At some point will need cardiac catheterization. -Unable to utilize traditional HF medications at this point because of hypotension. -Allow for processing of anesthesia postconversion.  We will keep a close eye on him. -Hold IV Lasix.  Atrial flutter -Successfully cardioverted. -No thrombus.  Severe functional MR.  Hopefully this will improve with heart failure improvement.  Continue to encourage tobacco alcohol cessation.  Donato Schultz, MD

## 2020-03-17 NOTE — Plan of Care (Signed)

## 2020-03-17 NOTE — Progress Notes (Signed)
Family Medicine Teaching Service Daily Progress Note Intern Pager: [phone]  Patient name: Bryan Guerrero Medical record number: [id number] Date of birth: March 29, 1971 Age: 49 y.o. Gender: male  Primary Care Provider: Reece Leader, DO Consultants: Cardiology  Code Status: Full  Pt Overview and Major Events to Date:  Admitted 10/4  Assessment and Plan: Bryan Guerrero is a 49 y.o. male presenting with dyspnea and intermittent chest pain . PMH is significant for hypertension, alcoholic cardiomyopathy and congestive heart failure.   Acute Respiratory Failure  Overnight had an episode of dyspnea that resolved within a few minutes. No desaturations noted. Per chart review of vitals, noted patient placed on mask of 6L supplementation for a few minutes before tranistioning back to room air which he was maintained on throughout the night. Given that previous echo on 08/05/2018 demonstrated EF of 15-20% and clinical exam of edema and crackles heard diffusly, likely due to CHF that is causing worsening pulmonary edema. Significantly elevated BNP further supports this. Other possible causes include non-ischemic cardiomyopathy given extensive alcoholic history and MI, will continue to trend troponins but no ST elevations noted on initial EKG. Patient tachycardic with shortness of breath concerning for PE. CT angio chest demonstrated no evidence of PE or thoracic aortic aneurysm without dissection evidence but indicated scattered areas of lower lobe scarring. CT abdomen/pelvis demonstrated small amount of free fluid in the dependent portion of the pelvis of unknown etiology. CBC unremarkable. Elevated troponin 53>52 which trended flat. Pt afebrile without leukocytosis however CTA Chest with findings concerning for atypical pneumonia. Patient's condition is likely further exacerbated by lack of appropriate medical follow up and noncompliance of medications. He will likely benefit from diuresis. Cardiology  following. s/p lopressor in ED and 250 cc bolus overnight  -cardiology consulted, continue to appreciate recs -IV amiodarone infusion -IV lasix 40 mg BID  -pending TEE -continuous cardiac monitoring -heart healthy diet -daily weights  -strict I/Os -f/u cardiology outpatient -SW to establish care with PCP, encourage adherence to prescribed medications    Acute on Chronic Systolic CHF  Alcoholic Non-Ischemic cardiomyopathy  On admission, patient reported worsening shortness of breath for the 5 days with LE edema, 2-3 pillow orthopnea and chest tightness.  In the ED patient's chest x-ray indicated cardiomegaly.  Patient received 40 mg of IV Lasix.  He is satting between 90-100% on room air. Most recently 98% O2 sat. BNP ~1400.  Most recentecho in 2020withEF of 15-20%, severely reduced (RV and LV) systolic function, moderately dilated LV, diffuse hypokinesis with septal-lateral dyssynergy suggestive of non-ischemic cardiomyopathy. Patient has been without heart failure medication. I/O is 564.9. - cardiology following, appreciate recommendations - pending TEE - start Lasix 40 mg IV, titrate depending on fluid status and creatinine - strict I's and O's  - daily weights - held Coreg 3.125 BID   New Onset Atrial Flutter  HR 119 this morning. Patient reporting chest pain and dyspnea. EKG concerning for A. flutter with RVR. HR 149. Patient given Lopressor in ED and then started on amiodarone gtt in the ED. Patient started on Eliquis in the ED. CHADS Vasc score of 2.  - cardiology following, appreciate recommendations, pending TEE - Amiodarone gtt, discontinue when able  - held Coreg 3.125 mg BID  - Eliquis 5 mg BID   HTN  Normotensive this morning at 111/84. Given 250 cc bolus overnight after soft blood pressure. Patient was told at last admission that he had hypertension but not currently on any home meds. Previously took Losartan.  Hypertensive on admission with BP 162/99.  -s/p  lopressor in ED and 250 cc bolus x1 overnight  -held carvedilil 3.125 mg, consider restarting when appropriate -consider holding amiodarone if hypotension returns and persists -continue to monitor BP     Alcohol Abuse  Patient drinks a fifth of liquor over a 3-4 day course along with 2-3 cans of beer daily. Has not had any alcohol since last week (Monday). Denies ever being hospitalized for alcohol withdrawal   -Monitor CIWA -multivitamin -thiamin daily -folic acid daily  -continued education -pending UDS   AKI Cr 1.63 on admission, baseline appears to be around 1.1-1.2. Likely contributing factor in the setting of untreated hypertension. -awaiting BMP this morning -continue to monitor with am BMP  Hyperthyroidism TSH wnl and elevated T4 1.39. No prior diagnosis. -follow up with PCP   Tobacco Use Disorder: -Patient previously smoking 2 black-and-mild cigars a day.  - Nicotine patch available upon request, patient initially refused   FEN/GI: heart healthy diet PPx: Eliquis    Status is: Inpatient  Remains inpatient appropriate because:Inpatient level of care appropriate due to severity of illness   Dispo: The patient is from: Home              Anticipated d/c is to: Home              Anticipated d/c date is: 2 days              Patient currently is not medically stable to d/c.        Subjective:  Overnight reported that he had an episode of dyspnea early this morning which resolved by the time our team went to go check on him. According to chart review, patient placed on 6L for a few minutes before transitioning to room air for which he was maintained on throughout the night. No desaturations noted.   Patient at TEE when I went to go see him this morning, spoke to wife who was in tears just overwhelmed with everything taking place, updated wife and provided reassurance.   Objective: Pulse Rate:  [107-137] 119 (10/05 0345) Resp:  [11-40] 40 (10/05 0630) BP:  (75-124)/(58-103) 93/78 (10/05 0630) SpO2:  [90 %-100 %] 99 % (10/05 0345) Physical Exam:  Physical exam unable to be obtained due to patient at TEE. Will return to complete.   Laboratory: Recent Labs  Lab 03/16/20 0436  WBC 6.6  HGB 13.4  HCT 42.8  PLT 276   Recent Labs  Lab 03/16/20 0436 03/16/20 0800  NA 135  --   K 4.1  --   CL 103  --   CO2 21*  --   BUN 23*  --   CREATININE 1.63*  --   CALCIUM 9.1  --   PROT  --  6.0*  BILITOT  --  1.3*  ALKPHOS  --  49  ALT  --  39  AST  --  33  GLUCOSE 141*  --       Imaging/Diagnostic Tests: CT Abdomen Pelvis Wo Contrast  Result Date: 03/16/2020 CLINICAL DATA:  Chest pain, pneumoperitoneum.  Nausea. EXAM: CT ABDOMEN AND PELVIS WITHOUT CONTRAST TECHNIQUE: Multidetector CT imaging of the abdomen and pelvis was performed following the standard protocol without IV contrast. COMPARISON:  CTA of same day. FINDINGS: Lower chest: No acute abnormality. Hepatobiliary: No focal liver abnormality is seen. No gallstones, gallbladder wall thickening, or biliary dilatation. Pancreas: Unremarkable. No pancreatic ductal dilatation or surrounding inflammatory changes. Spleen: Normal  in size without focal abnormality. Adrenals/Urinary Tract: Adrenal glands are unremarkable. Kidneys are normal, without renal calculi, focal lesion, or hydronephrosis. Bladder is unremarkable. Stomach/Bowel: Stomach is within normal limits. Appendix appears normal. No evidence of bowel wall thickening, distention, or inflammatory changes. That which was described as free air on prior CT exam of same day actually appears to represent a portion of colon that is interposed between the liver and diaphragm. There is no evidence of pneumoperitoneum. Vascular/Lymphatic: No significant vascular findings are present. No enlarged abdominal or pelvic lymph nodes. Reproductive: Prostate is unremarkable. Other: Small amount of free fluid is noted in the dependent portion of the pelvis of  unknown etiology. No hernia is noted. Musculoskeletal: No acute or significant osseous findings. IMPRESSION: 1. Small amount of free fluid is noted in the dependent portion of the pelvis of unknown etiology. 2. The finding which was described as free air on prior CT exam of same day actually appears to represent a portion of colon that is interposed between the liver and diaphragm. There is no evidence of pneumoperitoneum. 3. No significant abnormality is noted in the abdomen or pelvis. Electronically Signed   By: Lupita Raider M.D.   On: 03/16/2020 10:56   CT Angio Chest PE W/Cm &/Or Wo Cm  Result Date: 03/16/2020 CLINICAL DATA:  Chest pain and nausea EXAM: CT ANGIOGRAPHY CHEST WITH CONTRAST TECHNIQUE: Multidetector CT imaging of the chest was performed using the standard protocol during bolus administration of intravenous contrast. Multiplanar CT image reconstructions and MIPs were obtained to evaluate the vascular anatomy. CONTRAST:  66mL OMNIPAQUE IOHEXOL 350 MG/ML SOLN COMPARISON:  March 16, 2020 FINDINGS: Cardiovascular: There is no demonstrable pulmonary embolus. There is no thoracic aortic aneurysm. No dissection evident; the contrast bolus in the aorta is not sufficient for potential dissection assessment. Visualized great vessels appear unremarkable on this study with limited contrast in the great vessels. There is no pericardial effusion or pericardial thickening evident. There is a degree of cardiomegaly. Mediastinum/Nodes: Thyroid appears unremarkable. There is no appreciable thoracic adenopathy. No esophageal lesions are evident. Lungs/Pleura: There are scattered areas of ill-defined airspace opacity in each upper lobe region. There is no consolidation on either side. There is mild scarring in the bases. No pleural effusions are evident. There is bronchiectatic change in each lower lobe. Equivocal bronchiectatic change noted in the upper lobes. A small bulla measuring 8 x 8 mm is noted in the  right upper lobe. Upper Abdomen: There is pneumoperitoneum with a degree of upper abdominal ascites. Visualized upper abdominal structures otherwise appear unremarkable. Musculoskeletal: No blastic or lytic bone lesions. No chest wall lesions evident. Review of the MIP images confirms the above findings. IMPRESSION: 1. There is pneumoperitoneum with upper abdominal ascites. Etiology for this pneumoperitoneum uncertain. Question recent surgery as a potential etiology. If patient has not had recent surgical procedure involving the abdomen or pelvis, perforated viscus must be of concern based on this appearance. 2. No evident pulmonary embolus. No thoracic aortic aneurysm. No dissection evident; note that the contrast bolus in the aorta is not sufficient for dissection assessment. 3. Scattered areas of ill-defined airspace opacity in the upper lobes raises question for underlying atypical organism pneumonia. Check of COVID-19 status advised in this regard. No consolidation or pleural effusions. 4. Lower lobe bronchiectatic change bilaterally. Scattered areas of lower lobe scarring. 5.  No evident adenopathy. 6.  Cardiomegaly. Critical Value/emergent results were called by telephone at the time of interpretation on 03/16/2020 at 8:48 am  to provider [facility] , who verbally acknowledged these results. Electronically Signed   By: Bretta Bang III M.D.   On: 03/16/2020 08:48    Reece Leader, DO 03/17/2020, 6:59 AM PGY-1, [facility] FPTS Intern pager: [phone], text pages welcome

## 2020-03-17 NOTE — Progress Notes (Addendum)
FPTS Interim Progress Note  S: Paged by nurse for episode of SOB. Nurse reported that patient had been sleeping but when he was in the bedside chair called out that he was having SOB. When she went in there he was tripoding in the chair. He stated he did not want to lay back down in the bed because it would worsen the SOB. She obtained an EKG.  Interviewed patient bedside in ED.  He was laying down in the bed breathing well. He was on room air and sating in the high 90's. He denied any chest pain or tightness. He has no other complaints at this time and is otherwise stable.  Discussed with nurse to re-page if patient unstable.  O: BP 124/83   Pulse (!) 119   Temp 98.7 F (37.1 C) (Oral)   Resp 17   Ht 6' (1.829 m)   Wt 86.2 kg   SpO2 99%   BMI 25.77 kg/m   Physical Exam Vitals and nursing note reviewed.  Constitutional:      General: He is not in acute distress.    Appearance: He is not ill-appearing, toxic-appearing or diaphoretic.  HENT:     Head: Normocephalic and atraumatic.  Cardiovascular:     Rate and Rhythm: Regular rhythm. Tachycardia present.     Pulses: Normal pulses.          Radial pulses are 2+ on the right side and 2+ on the left side.       Dorsalis pedis pulses are 2+ on the right side and 2+ on the left side.     Heart sounds: Normal heart sounds, S1 normal and S2 normal. No murmur heard.   Pulmonary:     Effort: Pulmonary effort is normal. No respiratory distress.     Breath sounds: Normal breath sounds. No wheezing.  Abdominal:     General: There is no distension.     Tenderness: There is no abdominal tenderness.  Musculoskeletal:     Right lower leg: 1+ Pitting Edema present.     Left lower leg: 1+ Pitting Edema present.  Neurological:     Mental Status: He is alert. Mental status is at baseline.      A/P: SOB: Has history of COPD with occasional SOB episodes, has been prescribed in the past Primatene 1 puff TID PRN. EKG was reassuring as well  as no new O2 requirements. Had a negative CTA Chest on admission which showed no PE and denies any leg pain. Since episode has resolved and is otherwise stable will discuss with day team unless he becomes unstable. -Discuss with Day team -Will not give Primatene due to current sustained tachycardia -Low threshold for repeat CTA   Lauro Franklin, MD 03/17/2020, 5:58 AM PGY-1, [facility] Family Medicine Service pager [phone]

## 2020-03-17 NOTE — Progress Notes (Signed)
FPTS Interim Progress Note  S: Interviewed patient at bedside.  He reports feeling better today but is a little tired after his procedure. He asked about there swelling in his legs. Discussed wanting to watch him over night and most likely restarting Lasix in the morning. Discussed placing a pillow under his legs and if he would wear compression stockings. He reported he would wear them. He has no other complaints at this time.  O: BP 110/89 (BP Location: Left Arm)   Pulse 89   Temp 98.7 F (37.1 C) (Oral)   Resp 16   Ht 6' (1.829 m)   Wt 86.2 kg   SpO2 95%   BMI 25.77 kg/m   Physical Exam Vitals and nursing note reviewed.  Constitutional:      General: He is not in acute distress.    Appearance: He is well-developed and normal weight. He is not ill-appearing, toxic-appearing or diaphoretic.  HENT:     Head: Normocephalic and atraumatic.  Cardiovascular:     Rate and Rhythm: Normal rate and regular rhythm.     Pulses: Normal pulses.          Radial pulses are 2+ on the right side and 2+ on the left side.       Dorsalis pedis pulses are 2+ on the right side and 2+ on the left side.     Heart sounds: Normal heart sounds, S1 normal and S2 normal. No murmur heard.   Pulmonary:     Effort: Pulmonary effort is normal. No respiratory distress.     Breath sounds: Normal breath sounds. No wheezing.  Abdominal:     General: There is no distension.     Palpations: Abdomen is soft.     Tenderness: There is no abdominal tenderness.  Musculoskeletal:     Right lower leg: 1+ Pitting Edema present.     Left lower leg: 1+ Pitting Edema present.  Neurological:     Mental Status: He is alert. Mental status is at baseline.  Psychiatric:        Mood and Affect: Mood normal.        Behavior: Behavior normal.        Thought Content: Thought content normal.      A/P: Lower Extremity Edema: Continues to have bilateral lower extremity 1+ pitting edema. Due to soft blood pressures lasix have  been paused. Recommended placing a pillow under his legs to raise them. He agrees to wear compression stockings. -Order Knee high 20-30 mmHg compression stockings -Reassess by day team for restarting Lasix   Bryan Franklin, MD 03/17/2020, 9:51 PM PGY-1, [facility] Family Medicine Service pager [phone]

## 2020-03-17 NOTE — Progress Notes (Addendum)
FPTS Interim Progress Note  S: Patient seen and examined, unable to see him this morning since he was at TEE. Denies any dyspnea or chest pain. Denies any further complaints at this time.  O: BP 92/80 (BP Location: Left Arm)   Pulse 89   Temp (!) 97.3 F (36.3 C) (Oral)   Resp 16   Ht 6' (1.829 m)   Wt 86.2 kg   SpO2 100%   BMI 25.77 kg/m   General: Patient laying comfortably in bed, in no acute distress. HEENT: normocephalic, atraumatic, no evidence of JVD CV: RRR, no murmurs or gallops auscultated Resp: lungs clear to auscultation bilaterally, breathing comfortably on room air without signs of respiratory distress Abdomen: soft, nontender, presence of active bowel sounds Ext: radial and distal pulses strong and equal bilaterally, 1+ pitting edema right LE, 2+ pitting edema left LE, presence of left pedal edema, no calf tenderness Derm: skin warm and dry to touch, no new rashes or lesions noted  A/P: -continue to follow cardiology recommendations -monitor BP and HR -detailed plan in earlier note -stressed importance of proper f/u outpatient, patient agrees to this   Reece Leader, DO 03/17/2020, 2:09 PM PGY-1, [facility] Family Medicine Service pager [phone]

## 2020-03-17 NOTE — ED Notes (Signed)
Pt to endo for synchronized cardioversion

## 2020-03-17 NOTE — Anesthesia Procedure Notes (Signed)
Procedure Name: MAC Date/Time: 03/17/2020 7:37 AM Performed by: Leonor Liv, CRNA Pre-anesthesia Checklist: Patient identified, Emergency Drugs available, Suction available and Patient being monitored Patient Re-evaluated:Patient Re-evaluated prior to induction Oxygen Delivery Method: Nasal cannula Airway Equipment and Method: Bite block Placement Confirmation: positive ETCO2 Dental Injury: Teeth and Oropharynx as per pre-operative assessment

## 2020-03-17 NOTE — Progress Notes (Signed)
Dr Jens Som to bedside, pt to transfer to 3e15C, safety maintained

## 2020-03-17 NOTE — Interval H&P Note (Signed)
History and Physical Interval Note:  03/17/2020 7:33 AM  Bryan Guerrero  has presented today for surgery, with the diagnosis of a-fib.  The various methods of treatment have been discussed with the patient and family. After consideration of risks, benefits and other options for treatment, the patient has consented to  Procedure(s): TRANSESOPHAGEAL ECHOCARDIOGRAM (TEE) (N/A) CARDIOVERSION (N/A) as a surgical intervention.  The patient's history has been reviewed, patient examined, no change in status, stable for surgery.  I have reviewed the patient's chart and labs.  Questions were answered to the patient's satisfaction.     [name]

## 2020-03-17 NOTE — Progress Notes (Signed)
° ° °  Transesophageal Echocardiogram Note  DRAVON NOTT [id number] 1971-04-28  Procedure: Transesophageal Echocardiogram Indications: Atrial flutter  Procedure Details Consent: Obtained Time Out: Verified patient identification, verified procedure, site/side was marked, verified correct patient position, special equipment/implants available, Radiology Safety Procedures followed,  medications/allergies/relevent history reviewed, required imaging and test results available.  Performed  Medications:  Pt sedated by anesthesia with lidocaine 50 mg and diprovan 248 mg IV total. Also treated with precedex 8 ug.   Severe global LV dysfunction; severe RV dysfunction; 4 chamber enlargement; spontaneous contrast LAA but no thrombus; severe MR; moderate TR.  Subsequently underwent synchronized DCCV with 120J to sinus rhythm; no immediate complications; continue apixaban.    Complications: No apparent complications Patient did tolerate procedure well.  [name], MD

## 2020-03-18 ENCOUNTER — Inpatient Hospital Stay: Payer: Self-pay

## 2020-03-18 DIAGNOSIS — R570 Cardiogenic shock: Secondary | ICD-10-CM

## 2020-03-18 DIAGNOSIS — N179 Acute kidney failure, unspecified: Secondary | ICD-10-CM

## 2020-03-18 LAB — BASIC METABOLIC PANEL
Anion gap: 15 (ref 5–15)
BUN: 38 mg/dL — ABNORMAL HIGH (ref 6–20)
CO2: 21 mmol/L — ABNORMAL LOW (ref 22–32)
Calcium: 9 mg/dL (ref 8.9–10.3)
Chloride: 98 mmol/L (ref 98–111)
Creatinine, Ser: 2.19 mg/dL — ABNORMAL HIGH (ref 0.61–1.24)
GFR calc non Af Amer: 35 mL/min — ABNORMAL LOW (ref >60)
Glucose, Bld: 133 mg/dL — ABNORMAL HIGH (ref 70–99)
Potassium: 4.5 mmol/L (ref 3.5–5.1)
Sodium: 134 mmol/L — ABNORMAL LOW (ref 135–145)

## 2020-03-18 LAB — HEPATIC FUNCTION PANEL
ALT: 304 U/L — ABNORMAL HIGH (ref 0–44)
AST: 265 U/L — ABNORMAL HIGH (ref 15–41)
Albumin: 3.4 g/dL — ABNORMAL LOW (ref 3.5–5.0)
Alkaline Phosphatase: 46 U/L (ref 38–126)
Bilirubin, Direct: 0.4 mg/dL — ABNORMAL HIGH (ref 0.0–0.2)
Indirect Bilirubin: 1 mg/dL — ABNORMAL HIGH (ref 0.3–0.9)
Total Bilirubin: 1.4 mg/dL — ABNORMAL HIGH (ref 0.3–1.2)
Total Protein: 5.5 g/dL — ABNORMAL LOW (ref 6.5–8.1)

## 2020-03-18 LAB — COOXEMETRY PANEL
Carboxyhemoglobin: 0.9 % (ref 0.5–1.5)
Methemoglobin: 0.6 % (ref 0.0–1.5)
O2 Saturation: 52 %
Total hemoglobin: 12.6 g/dL (ref 12.0–16.0)

## 2020-03-18 LAB — HEPARIN LEVEL (UNFRACTIONATED): Heparin Unfractionated: >2.2 IU/mL — ABNORMAL HIGH (ref 0.30–0.70)

## 2020-03-18 LAB — HEPATITIS PANEL, ACUTE
HCV Ab: NONREACTIVE
Hep A IgM: NONREACTIVE
Hep B C IgM: NONREACTIVE
Hepatitis B Surface Ag: NONREACTIVE

## 2020-03-18 LAB — HEMOGLOBIN A1C
Hgb A1c MFr Bld: 5.2 % (ref 4.8–5.6)
Mean Plasma Glucose: 102.54 mg/dL

## 2020-03-18 LAB — CBC
HCT: 39.2 % (ref 39.0–52.0)
Hemoglobin: 12.7 g/dL — ABNORMAL LOW (ref 13.0–17.0)
MCH: 30.4 pg (ref 26.0–34.0)
MCHC: 32.4 g/dL (ref 30.0–36.0)
MCV: 93.8 fL (ref 80.0–100.0)
Platelets: 221 10*3/uL (ref 150–400)
RBC: 4.18 MIL/uL — ABNORMAL LOW (ref 4.22–5.81)
RDW: 12 % (ref 11.5–15.5)
WBC: 6.5 10*3/uL (ref 4.0–10.5)
nRBC: 0 % (ref 0.0–0.2)

## 2020-03-18 LAB — MAGNESIUM: Magnesium: 2.3 mg/dL (ref 1.7–2.4)

## 2020-03-18 LAB — APTT: aPTT: 37 seconds — ABNORMAL HIGH (ref 24–36)

## 2020-03-18 LAB — LACTIC ACID, PLASMA: Lactic Acid, Venous: 1.7 mmol/L (ref 0.5–1.9)

## 2020-03-18 MED ORDER — AMIODARONE HCL IN DEXTROSE 360-4.14 MG/200ML-% IV SOLN
30.0000 mg/h | INTRAVENOUS | Status: DC
  Administered 2020-03-19 – 2020-03-23 (×8): 30 mg/h via INTRAVENOUS
  Filled 2020-03-18 (×11): qty 200

## 2020-03-18 MED ORDER — CHLORHEXIDINE GLUCONATE CLOTH 2 % EX PADS
6.0000 | MEDICATED_PAD | Freq: Every day | CUTANEOUS | Status: DC
  Administered 2020-03-18 – 2020-03-26 (×9): 6 via TOPICAL

## 2020-03-18 MED ORDER — SODIUM CHLORIDE 0.9% FLUSH
3.0000 mL | Freq: Two times a day (BID) | INTRAVENOUS | Status: DC
  Administered 2020-03-18 – 2020-03-22 (×4): 3 mL via INTRAVENOUS

## 2020-03-18 MED ORDER — FUROSEMIDE 10 MG/ML IJ SOLN
60.0000 mg | Freq: Two times a day (BID) | INTRAMUSCULAR | Status: DC
  Administered 2020-03-18 – 2020-03-19 (×3): 60 mg via INTRAVENOUS
  Filled 2020-03-18 (×3): qty 6

## 2020-03-18 MED ORDER — MILRINONE LACTATE IN DEXTROSE 20-5 MG/100ML-% IV SOLN: 0.2500 ug/kg/min | INTRAVENOUS | Status: DC

## 2020-03-18 MED ORDER — DIGOXIN 125 MCG PO TABS
0.1250 mg | ORAL_TABLET | Freq: Every day | ORAL | Status: DC
  Administered 2020-03-18 – 2020-03-26 (×9): 0.125 mg via ORAL
  Filled 2020-03-18 (×10): qty 1

## 2020-03-18 MED ORDER — SODIUM CHLORIDE 0.9% FLUSH: 10.0000 mL | INTRAVENOUS | Status: DC | PRN

## 2020-03-18 MED ORDER — HEPARIN (PORCINE) 25000 UT/250ML-% IV SOLN
1400.0000 [IU]/h | INTRAVENOUS | Status: DC
  Administered 2020-03-18: 1250 [IU]/h via INTRAVENOUS
  Filled 2020-03-18 (×2): qty 250

## 2020-03-18 MED ORDER — MILRINONE LACTATE IN DEXTROSE 20-5 MG/100ML-% IV SOLN
0.1250 ug/kg/min | INTRAVENOUS | Status: DC
  Administered 2020-03-19: 0.25 ug/kg/min via INTRAVENOUS
  Administered 2020-03-19 – 2020-03-22 (×4): 0.125 ug/kg/min via INTRAVENOUS
  Filled 2020-03-18 (×7): qty 100

## 2020-03-18 NOTE — Progress Notes (Signed)
FPTS Interim Progress Note  S: Interviewed patient at bedside.  He reports that he is doing well. States had a slight episode of nausea without vomiting during his last IV medication push. He reports that it was minor and already disappearing. He reports that he did not receive his compression stockings ordered last night. He has no other complaints at this time.  Current BP: 115/95  HR: 90  O: BP 109/81 (BP Location: Left Arm)   Pulse 89   Temp 98 F (36.7 C) (Oral)   Resp 18   Ht 6' (1.829 m)   Wt 90.7 kg   SpO2 94%   BMI 27.12 kg/m   Physical Exam Vitals and nursing note reviewed.  Constitutional:      General: He is not in acute distress.    Appearance: He is well-developed and normal weight. He is not ill-appearing, toxic-appearing or diaphoretic.  HENT:     Head: Normocephalic and atraumatic.  Cardiovascular:     Rate and Rhythm: Normal rate and regular rhythm.     Pulses: Normal pulses.          Radial pulses are 2+ on the right side and 2+ on the left side.       Dorsalis pedis pulses are 2+ on the right side and 2+ on the left side.     Heart sounds: Normal heart sounds, S1 normal and S2 normal. No murmur heard.   Pulmonary:     Effort: Pulmonary effort is normal. No respiratory distress.     Breath sounds: Normal breath sounds. No wheezing.  Abdominal:     General: There is no distension.     Tenderness: There is no abdominal tenderness.  Musculoskeletal:     Right lower leg: 1+ Edema present.     Left lower leg: 1+ Edema present.  Neurological:     Mental Status: He is alert. Mental status is at baseline.  Psychiatric:        Mood and Affect: Mood normal.        Behavior: Behavior normal.        Thought Content: Thought content normal.      A/P: Bilateral Lower Extremity Edema: Swelling is improved. Did not receive compression stockings ordered last night. Spoke with nurse to ensure that he receives them. -Spoke with nurse about getting  stockings   Currently stable will continue with current plans   Lauro Franklin, MD 03/18/2020, 8:11 PM PGY-1, [facility] Family Medicine Service pager [phone]

## 2020-03-18 NOTE — Social Work (Signed)
CSW met with pt at bedside, discussed SA, provided resources. Pt was appreciative of the information provided, stated that he understands his drinking is a problem. Stated he will look over the information and see if any of it will be helpful to him.

## 2020-03-18 NOTE — Consult Note (Addendum)
Advanced Heart Failure Team Consult Note   Primary Physician: Reece Leader, DO PCP-Cardiologist:  Thurmon Fair, MD  Reason for Consultation: A/C Systolic Heart Failure   HPI:    Bryan Guerrero is seen today for evaluation of heart failure at the request of Dr Anne Fu.   Bryan Guerrero is a 49 year old with history of chronic systolic heart failure dating back to 2020, ETOH abuse, HTN, and tobacco abuse.  He has not had a cath.   Admitted back in February 2020 with increased shortness of breath. At that time he was drinking 1/5 of licquor per day. ECHO completed and showed severely reduced EF 15-20%. 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 No formal cath. He was discharged 08/08/19. He returned for one appointment with cardiology. He has not followed up since then.   About 2 weeks ago he developed increased shortness of breath and fatigue. Lost his appetite. Stopped drinking and smoking 2 weeks ago. Heavy drinker for many years. Works full time a Chartered loss adjuster. Has no insurance. He has not been taking any medications.  He has business degree.   Presented with to [facility] with chest pain and increased shortness of breath. EKG showed Aflutter 149. Given lopressor 5 mg x2 followed by  amio drip.  Started IV lasix. CT abdomen showed small amount of free fluid in pelvis, portion of colon that is interposed between the liver and diaphragm, and no evidence of pneumoperitoneum.  CTA - negative for PE  but upper abd with pneumoperitoneum with a degree of upper abdominal ascites. Pertinent admission labs included: Na 135, K 4.1, Creatinine 1.6, direct bili 0.3, BNP 1394, HS Trop 53>52   Yesterday had TEE followed by successful cardioversion.  Post cardioversion had hypotension.  Diuretics held. Labs obtained and creatinine had gone up to 2.9. Diuretics held. Creatinine down to 2.1 today.   Echo 2021 EF 15-20%  Echo 07/2018 EF 15-20%.   Review of Systems: [y] = yes,  = no   . General: Weight gain [  ]; Weight loss ; Anorexia ; Fatigue [Y ]; Fever ; Chills ; Weakness   . Cardiac: Chest pain/pressure ; Resting SOB ; Exertional SOB [ Y]; Orthopnea ; Pedal Edema ; Palpitations ; Syncope ; Presyncope ; Paroxysmal nocturnal dyspnea[ ]   . Pulmonary: Cough ; Wheezing[ ] ; Hemoptysis[ ] ; Sputum ; Snoring   . GI: Vomiting[ ] ; Dysphagia[ ] ; Melena[ ] ; Hematochezia ; Heartburn[ ] ; Abdominal pain ; Constipation ; Diarrhea ; BRBPR   . GU: Hematuria[ ] ; Dysuria ; Nocturia[ ]   . Vascular: Pain in legs with walking ; Pain in feet with lying flat ; Non-healing sores ; Stroke ; TIA ; Slurred speech ;  . Neuro: Headaches[ ] ; Vertigo[ ] ; Seizures[ ] ; Paresthesias[ ] ;Blurred vision ; Diplopia ; Vision changes   . Ortho/Skin: Arthritis ; Joint pain ; Muscle pain ; Joint swelling ; Back Pain ; Rash   . Psych: Depression[ ] ; Anxiety[ ]   . Heme: Bleeding problems ; Clotting disorders ; Anemia   . Endocrine: Diabetes ; Thyroid dysfunction[ ]   Home Medications Prior to Admission medications   Medication Sig Start Date End Date Taking? Authorizing Provider  EPINEPHrine (PRIMATENE MIST) 0.125 MG/ACT AERO Inhale 1 puff into the lungs 3 (three) times daily  as needed (shortness od breath).   Yes [provider]  Multiple Vitamin (MULTIVITAMIN WITH MINERALS) TABS tablet Take 1 tablet by mouth daily.   Yes [provider]  carvedilol (COREG) 3.125 MG tablet Take 1 tablet by mouth twice daily with food Patient not taking: Reported on 03/16/2020 11/12/18   Lovena Neighbours, MD  carvedilol (COREG) 6.25 MG tablet Take 1 tablet (6.25 mg total) by mouth 2 (two) times daily with a meal. Patient not taking: Reported on 03/16/2020 08/24/18 03/16/29  Georgie Chard D, NP  furosemide (LASIX) 40 MG tablet TAKE 1 TABLET BY MOUTH EVERY DAY Patient not taking: Reported on 03/16/2020 11/12/18   Lovena Neighbours,  MD  losartan (COZAAR) 25 MG tablet TAKE 1 TABLET BY MOUTH EVERY DAY Patient not taking: Reported on 03/16/2020 11/12/18   Lovena Neighbours, MD  metoprolol tartrate (LOPRESSOR) 50 MG tablet Take 2 hours prior to procedure Patient not taking: Reported on 03/16/2020 08/24/18   Filbert Schilder, NP    Past Medical History: Past Medical History:  Diagnosis Date  . Alcohol abuse   . Asthma   . Cardiomyopathy (HCC)   . Dyspnea   . Hypertension     Past Surgical History: Past Surgical History:  Procedure Laterality Date  . TEE WITHOUT CARDIOVERSION  10/052021    Family History: Family History  Problem Relation Age of Onset  . Hypertension Father     Social History: Social History   Socioeconomic History  . Marital status: Single    Spouse name: Not on file  . Number of children: Not on file  . Years of education: Not on file  . Highest education level: Not on file  Occupational History  . Not on file  Tobacco Use  . Smoking status: Former Smoker    Years: 15.00    Types: Cigars    Quit date: 07/14/2018    Years since quitting: 1.6  . Smokeless tobacco: Never Used  Vaping Use  . Vaping Use: Never used  Substance and Sexual Activity  . Alcohol use: Yes    Alcohol/week: 51.0 standard drinks    Types: 1 Cans of beer, 50 Shots of liquor per week  . Drug use: Yes    Types: Marijuana  . Sexual activity: Not on file  Other Topics Concern  . Not on file  Social History Narrative  . Not on file   Social Determinants of Health   Financial Resource Strain:   . Difficulty of Paying Living Expenses: Not on file  Food Insecurity:   . Worried About Programme researcher, broadcasting/film/video in the Last Year: Not on file  . Ran Out of Food in the Last Year: Not on file  Transportation Needs:   . Lack of Transportation (Medical): Not on file  . Lack of Transportation (Non-Medical): Not on file  Physical Activity:   . Days of Exercise per Week: Not on file  . Minutes of Exercise per Session: Not on  file  Stress:   . Feeling of Stress : Not on file  Social Connections:   . Frequency of Communication with Friends and Family: Not on file  . Frequency of Social Gatherings with Friends and Family: Not on file  . Attends Religious Services: Not on file  . Active Member of Clubs or Organizations: Not on file  . Attends Banker Meetings: Not on file  . Marital Status: Not on file    Allergies:  No Known Allergies  Objective:  Vital Signs:   Temp:  [97.3 F (36.3 C)-98.7 F (37.1 C)] 97.6 F (36.4 C) (10/06 0600) Pulse Rate:  [86-89] 89 (10/05 2004) Resp:  [16-18] 17 (10/06 0600) BP: (92-115)/(80-92) 115/89 (10/06 0600) SpO2:  [95 %-100 %] 96 % (10/06 0600) Weight:  [90.7 kg] 90.7 kg (10/06 0409) Last BM Date: 03/17/20  Weight change: Filed Weights   03/16/20 0434 03/18/20 0409  Weight: 86.2 kg 90.7 kg    Intake/Output:   Intake/Output Summary (Last 24 hours) at 03/18/2020 0957 Last data filed at 03/18/2020 0410 Gross per 24 hour  Intake 600 ml  Output 400 ml  Net 200 ml      Physical Exam    General:  Sitting on the side of the bed. No resp difficulty HEENT: normal Neck: supple. JVP 8-9 . Carotids 2+ bilat; no bruits. No lymphadenopathy or thyromegaly appreciated. Cor: PMI nondisplaced. Regular rate & rhythm. No rubs, gallops or murmurs. Lungs: clear Abdomen: soft, nontender, nondistended. No hepatosplenomegaly. No bruits or masses. Good bowel sounds. Extremities: cool extremities, no cyanosis, clubbing, rash, edema Neuro: alert & orientedx3, cranial nerves grossly intact. moves all 4 extremities w/o difficulty. Affect pleasant   Telemetry   SR 90s   EKG    SR 85 bpm 03/17/20   Labs   Basic Metabolic Panel: Recent Labs  Lab 03/16/20 0436 03/16/20 1133 03/17/20 1248 03/18/20 0758  NA 135  --  135 134*  K 4.1  --  5.2* 4.5  CL 103  --  99 98  CO2 21*  --  17* 21*  GLUCOSE 141*  --  125* 133*  BUN 23*  --  35* 38*  CREATININE  1.63*  --  2.90* 2.19*  CALCIUM 9.1  --  9.4 9.0  MG  --  2.2 2.7* 2.3    Liver Function Tests: Recent Labs  Lab 03/16/20 0800 03/18/20 0758  AST 33 265*  ALT 39 304*  ALKPHOS 49 46  BILITOT 1.3* 1.4*  PROT 6.0* 5.5*  ALBUMIN 3.6 3.4*   No results for input(s): LIPASE, AMYLASE in the last 168 hours. No results for input(s): AMMONIA in the last 168 hours.  CBC: Recent Labs  Lab 03/16/20 0436 03/17/20 1248 03/18/20 0758  WBC 6.6 9.3 6.5  HGB 13.4 13.9 12.7*  HCT 42.8 44.7 39.2  MCV 95.7 97.2 93.8  PLT 276 265 221    Cardiac Enzymes: No results for input(s): CKTOTAL, CKMB, CKMBINDEX, TROPONINI in the last 168 hours.  BNP: BNP (last 3 results) Recent Labs    03/16/20 0800  BNP 1,394.3*    ProBNP (last 3 results) No results for input(s): PROBNP in the last 8760 hours.   CBG: No results for input(s): GLUCAP in the last 168 hours.  Coagulation Studies: No results for input(s): LABPROT, INR in the last 72 hours.   Imaging   ECHOCARDIOGRAM COMPLETE  Result Date: 03/17/2020    ECHOCARDIOGRAM REPORT   Patient Name:   Bryan Guerrero Date of Exam: 03/17/2020 Medical Rec #:  [id number]       Height:       72.0 in Accession #:    [id number]      Weight:       190.0 lb Date of Birth:  1971-04-20      BSA:          2.085 m Patient Age:    48 years        BP:  92/80 mmHg Patient Gender: M               HR:           88 bpm. Exam Location:  Inpatient Procedure: 2D Echo, Cardiac Doppler, Color Doppler and Intracardiac            Opacification Agent Indications:    Cardiomyopathy-Unspecified 425.9 / I42.9  History:        Patient has prior history of Echocardiogram examinations, most                 recent 08/05/2018. CHF and Cardiomyopathy, Arrythmias:Atrial                 Flutter, Signs/Symptoms:Dyspnea; Risk Factors:Hypertension and                 Former Smoker.  Sonographer:    Renella Cunas RDCS Referring Phys: [id number] LAURA R INGOLD IMPRESSIONS  1. Left ventricular  ejection fraction, by estimation, is <20%. The left ventricle has severely decreased function. The left ventricle demonstrates global hypokinesis. Left ventricular diastolic parameters are consistent with Grade III diastolic dysfunction (restrictive). Elevated left ventricular end-diastolic pressure.  2. Right ventricular systolic function is normal. The right ventricular size is normal. Moderately increased right ventricular wall thickness. There is normal pulmonary artery systolic pressure.  3. Left atrial size was severely dilated.  4. Right atrial size was mildly dilated.  5. The mitral valve is normal in structure. Moderate to severe mitral valve regurgitation. No evidence of mitral stenosis.  6. The aortic valve is normal in structure. Aortic valve regurgitation is not visualized. No aortic stenosis is present.  7. The inferior vena cava is dilated in size with >50% respiratory variability, suggesting right atrial pressure of 8 mmHg. FINDINGS  Left Ventricle: Left ventricular ejection fraction, by estimation, is <20%. The left ventricle has severely decreased function. The left ventricle demonstrates global hypokinesis. Definity contrast agent was given IV to delineate the left ventricular endocardial borders. The left ventricular internal cavity size was normal in size. There is no left ventricular hypertrophy. Left ventricular diastolic parameters are consistent with Grade III diastolic dysfunction (restrictive). Elevated left ventricular end-diastolic pressure. Right Ventricle: The right ventricular size is normal. Moderately increased right ventricular wall thickness. Right ventricular systolic function is normal. There is normal pulmonary artery systolic pressure. The tricuspid regurgitant velocity is 2.39 m/s, and with an assumed right atrial pressure of 8 mmHg, the estimated right ventricular systolic pressure is 30.8 mmHg. Left Atrium: Left atrial size was severely dilated. Right Atrium: Right atrial  size was mildly dilated. Pericardium: There is no evidence of pericardial effusion. Mitral Valve: The mitral valve is normal in structure. Moderate to severe mitral valve regurgitation. No evidence of mitral valve stenosis. Tricuspid Valve: The tricuspid valve is normal in structure. Tricuspid valve regurgitation is mild . No evidence of tricuspid stenosis. Aortic Valve: The aortic valve is normal in structure. Aortic valve regurgitation is not visualized. No aortic stenosis is present. Pulmonic Valve: The pulmonic valve was normal in structure. Pulmonic valve regurgitation is not visualized. No evidence of pulmonic stenosis. Aorta: The aortic root is normal in size and structure. Venous: The inferior vena cava is dilated in size with greater than 50% respiratory variability, suggesting right atrial pressure of 8 mmHg. IAS/Shunts: No atrial level shunt detected by color flow Doppler.  LEFT VENTRICLE PLAX 2D LVOT diam:     2.40 cm      Diastology LV SV:  22           LV e' medial:    2.81 cm/s LV SV Index:   11           LV E/e' medial:  28.8 LVOT Area:     4.52 cm     LV e' lateral:   4.19 cm/s                             LV E/e' lateral: 19.3  LV Volumes (MOD) LV vol d, MOD A2C: 347.0 ml LV vol d, MOD A4C: 287.0 ml LV vol s, MOD A2C: 318.0 ml LV vol s, MOD A4C: 246.0 ml LV SV MOD A2C:     29.0 ml LV SV MOD A4C:     287.0 ml LV SV MOD BP:      38.4 ml RIGHT VENTRICLE RV S prime:     3.12 cm/s TAPSE (M-mode): 1.0 cm LEFT ATRIUM              Index       RIGHT ATRIUM           Index LA Vol (A2C):   108.0 ml 51.81 ml/m RA Area:     19.90 cm LA Vol (A4C):   91.8 ml  44.04 ml/m RA Volume:   67.10 ml  32.19 ml/m LA Biplane Vol: 100.0 ml 47.97 ml/m  AORTIC VALVE LVOT Vmax:   42.00 cm/s LVOT Vmean:  27.300 cm/s LVOT VTI:    0.049 m  AORTA Ao Root diam: 3.10 cm MITRAL VALVE               TRICUSPID VALVE MV Area (PHT): 6.32 cm    TR Peak grad:   22.8 mmHg MV Decel Time: 120 msec    TR Vmax:        239.00 cm/s Bryan  Peak grad: 51.0 mmHg Bryan Mean grad: 33.0 mmHg    SHUNTS Bryan Vmax:      357.00 cm/s  Systemic VTI:  0.05 m Bryan Vmean:     266.0 cm/s   Systemic Diam: 2.40 cm MV E velocity: 81.00 cm/s MV A velocity: 29.50 cm/s MV E/A ratio:  2.75 Chilton Si MD Electronically signed by Chilton Si MD Signature Date/Time: 03/17/2020/3:04:41 PM    Final       Medications:     Current Medications: . amiodarone  200 mg Oral BID  . apixaban  5 mg Oral BID  . folic acid  1 mg Oral Daily  . multivitamin with minerals  1 tablet Oral Daily  . sodium chloride flush  3 mL Intravenous Q12H  . thiamine  100 mg Oral Daily     Infusions: . sodium chloride         Assessment/Plan   1. A/C Systolic Heart Failure-->Cardiogenic Shock post cardioversion ECHO has been severely reduced since 2020. EF remains 15-20%. No formal cath.  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 HIV Non Reactive. TSH ok.  -Volume status mildly elevated. Place PICC to check CO-OX and CVP.  - Hold off on spiro/dig/arni with AKI - Will need CMRI -Refer to cardiac rehab.   2. AKI  Lab Results  Component Value Date   CREATININE 2.19 (H) 03/18/2020   CREATININE 2.90 (H) 03/17/2020   CREATININE 1.63 (H) 03/16/2020  Suspect in the setting of sedated followed by shock.  Improving today    3. A flutter RVR S/P TEE  DC-CV with conversion to NSR.  Transitioned from IV amio to po amio.  - Stop eliquis and start heparin drip for procedure.   4. Elevated LFTs suspect in the setting of shock LFTS normal on admit.  ASTG 33>265 ALT 39>309   5. ETOH  Refer to [facility] for outpatient options for ETOH abuse.    Refer to cardiac rehab.   Medication concerns reviewed with patient and pharmacy team. Barriers identified: Will need to give 30 day supply of meds. He will need close follow up.   Length of Stay: 2  Tonye Becket, NP  03/18/2020, 9:57 AM  Advanced Heart Failure Team Pager [phone] (M-F; 7a - 4p)  Please contact [facility] for night-coverage after hours (4p -7a ) and weekends on [email or web address]  Patient seen with NP, agree with the above note.   Patient with history of cardiomyopathy, possibly due to ETOH. He has not been on any medications at home. Admitted with 2 wks of dyspnea and fatigue.  Quit drinking and smoking at that time, but was drinking heavily before. Admitted with atrial flutter/RVR.   He had a CTA chest without PE at admission.  He had TEE-guided DCCV yesterday.  TEE showed EF <20%, severe RV dysfunction, severe Bryan.  Post-DCCV, he developed hypotension and was cool, concerned for cardiogenic shock.  Creatinine up to 2.9.  He gradually improved, now creatinine down to 2.19. BP stable, no complaints today.  He remains in NSR.   General: NAD Neck: JVP 14-16 cm, no thyromegaly or thyroid nodule.  Lungs: Mild crackles at bases.  CV: Lateral PMI.  Heart regular S1/S2, no S3/S4, 2/6 HSM apex.  1+ ankle edema.  Abdomen: Soft, nontender, no hepatosplenomegaly, no distention.  Skin: Intact without lesions or rashes.  Neurologic: Alert and oriented x 3.  Psych: Normal affect. Extremities: No clubbing or cyanosis.  HEENT: Normal.   1. Acute on chronic systolic CHF: Biventricular failure, TEE this admission with EF<20%, severe RV dysfunction, severe Bryan. Cause of cardiomyopathy uncertain, possibly due to ETOH (heavy).  Has not been on meds at home.  Concern for low output HF, creatinine up to 2.9 yesterday possibly due to stunning post-DCCV, now improved at 2.1.  BP stable.  He is volume overloaded on exam.  - Will place PICC to follow CVP and co-ox => suspect that he may need milrinone gtt.  - Lasix 60 mg IV bid for now.  - With fall in creatinine, will add digoxin 0.125 - Add other HF meds based on creatinine and BP trend.  - Needs complete abstinence from ETOH.  - Cardiac MRI if no CAD found.  - Would evaluate for CAD, will aim for LHC/RHC if creatinine comes down (?tomorrow).  Switch anticoagulation  to heparin gtt in preparation for this.  - Not candidate for advanced therapies unless he quits drinking.  2. Atrial flutter: Now back in NSR s/p DCCV.  Suspect recurrence risk is high.  - Continue amiodarone po for now, transition to IV if he starts milrinone today.  - Transition to IV heparin today pre-cath, back to Eliquis eventually.  - Consider ablation in the future.  3. AKI on ?CKD stage 3: Possible baseline cardiorenal syndrome.  Creatinine up to 2.9 yesterday post-DCCV, possibly stunning with transient cardiogenic shock.  Seems to be doing better today, creatinine trending down.  - Volume overloaded, starting IV Lasix.  - If co-ox low, will support CO on milrinone gtt.  4. Elevated LFTs: Suspect shock liver.  5. ETOH  abuse: Strongly urged cessation.  6. Smoking: Urged cessation.   Marca Ancona 03/18/2020 11:37 AM

## 2020-03-18 NOTE — H&P (View-Only) (Signed)
  Advanced Heart Failure Team Consult Note   Primary Physician: Ganta, Anupa, DO PCP-Cardiologist:  Mihai Croitoru, MD  Reason for Consultation: A/C Systolic Heart Failure   HPI:    Bryan Guerrero is seen today for evaluation of heart failure at the request of Dr Skains.   Bryan Guerrero is a 49 year old with history of chronic systolic heart failure dating back to 2020, ETOH abuse, HTN, and tobacco abuse.  He has not had a cath.   Admitted back in February 2020 with increased shortness of breath. At that time he was drinking 1/5 of licquor per day. ECHO completed and showed severely reduced EF 15-20%. 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 No formal cath. He was discharged 08/08/19. He returned for one appointment with cardiology. He has not followed up since then.   About 2 weeks ago he developed increased shortness of breath and fatigue. Lost his appetite. Stopped drinking and smoking 2 weeks ago. Heavy drinker for many years. Works full time a [employer]. Has no insurance. He has not been taking any medications.  He has business degree.   Presented with to [facility] with chest pain and increased shortness of breath. EKG showed Aflutter 149. Given lopressor 5 mg x2 followed by  amio drip.  Started IV lasix. CT abdomen showed small amount of free fluid in pelvis, portion of colon that is interposed between the liver and diaphragm, and no evidence of pneumoperitoneum.  CTA - negative for PE  but upper abd with pneumoperitoneum with a degree of upper abdominal ascites. Pertinent admission labs included: Na 135, K 4.1, Creatinine 1.6, direct bili 0.3, BNP 1394, HS Trop 53>52   Yesterday had TEE followed by successful cardioversion.  Post cardioversion had hypotension.  Diuretics held. Labs obtained and creatinine had gone up to 2.9. Diuretics held. Creatinine down to 2.1 today.   Echo 2021 EF 15-20%  Echo 07/2018 EF 15-20%.   Review of Systems: [y] = yes, [ ] = no   . General: Weight gain [  ]; Weight loss [ ]; Anorexia [ ]; Fatigue [Y ]; Fever [ ]; Chills [ ]; Weakness [ ]  . Cardiac: Chest pain/pressure [ ]; Resting SOB [ ]; Exertional SOB [ Y]; Orthopnea [ ]; Pedal Edema [ ]; Palpitations [ ]; Syncope [ ]; Presyncope [ ]; Paroxysmal nocturnal dyspnea[ ]  . Pulmonary: Cough [ ]; Wheezing[ ]; Hemoptysis[ ]; Sputum [ ]; Snoring [ ]  . GI: Vomiting[ ]; Dysphagia[ ]; Melena[ ]; Hematochezia [ ]; Heartburn[ ]; Abdominal pain [ ]; Constipation [ ]; Diarrhea [ ]; BRBPR [ ]  . GU: Hematuria[ ]; Dysuria [ ]; Nocturia[ ]  . Vascular: Pain in legs with walking [ ]; Pain in feet with lying flat [ ]; Non-healing sores [ ]; Stroke [ ]; TIA [ ]; Slurred speech [ ];  . Neuro: Headaches[ ]; Vertigo[ ]; Seizures[ ]; Paresthesias[ ];Blurred vision [ ]; Diplopia [ ]; Vision changes [ ]  . Ortho/Skin: Arthritis [ ]; Joint pain [ ]; Muscle pain [ ]; Joint swelling [ ]; Back Pain [ ]; Rash [ ]  . Psych: Depression[ ]; Anxiety[ ]  . Heme: Bleeding problems [ ]; Clotting disorders [ ]; Anemia [ ]  . Endocrine: Diabetes [ ]; Thyroid dysfunction[ ]  Home Medications Prior to Admission medications   Medication Sig Start Date End Date Taking? Authorizing Provider  EPINEPHrine (PRIMATENE MIST) 0.125 MG/ACT AERO Inhale 1 puff into the lungs 3 (three) times daily   as needed (shortness od breath).   Yes [provider]  Multiple Vitamin (MULTIVITAMIN WITH MINERALS) TABS tablet Take 1 tablet by mouth daily.   Yes [provider]  carvedilol (COREG) 3.125 MG tablet Take 1 tablet by mouth twice daily with food Patient not taking: Reported on 03/16/2020 11/12/18   Diallo, Abdoulaye, MD  carvedilol (COREG) 6.25 MG tablet Take 1 tablet (6.25 mg total) by mouth 2 (two) times daily with a meal. Patient not taking: Reported on 03/16/2020 08/24/18 03/16/29  McDaniel, Jill D, NP  furosemide (LASIX) 40 MG tablet TAKE 1 TABLET BY MOUTH EVERY DAY Patient not taking: Reported on 03/16/2020 11/12/18   Diallo, Abdoulaye,  MD  losartan (COZAAR) 25 MG tablet TAKE 1 TABLET BY MOUTH EVERY DAY Patient not taking: Reported on 03/16/2020 11/12/18   Diallo, Abdoulaye, MD  metoprolol tartrate (LOPRESSOR) 50 MG tablet Take 2 hours prior to procedure Patient not taking: Reported on 03/16/2020 08/24/18   McDaniel, Jill D, NP    Past Medical History: Past Medical History:  Diagnosis Date  . Alcohol abuse   . Asthma   . Cardiomyopathy (HCC)   . Dyspnea   . Hypertension     Past Surgical History: Past Surgical History:  Procedure Laterality Date  . TEE WITHOUT CARDIOVERSION  10/052021    Family History: Family History  Problem Relation Age of Onset  . Hypertension Father     Social History: Social History   Socioeconomic History  . Marital status: Single    Spouse name: Not on file  . Number of children: Not on file  . Years of education: Not on file  . Highest education level: Not on file  Occupational History  . Not on file  Tobacco Use  . Smoking status: Former Smoker    Years: 15.00    Types: Cigars    Quit date: 07/14/2018    Years since quitting: 1.6  . Smokeless tobacco: Never Used  Vaping Use  . Vaping Use: Never used  Substance and Sexual Activity  . Alcohol use: Yes    Alcohol/week: 51.0 standard drinks    Types: 1 Cans of beer, 50 Shots of liquor per week  . Drug use: Yes    Types: Marijuana  . Sexual activity: Not on file  Other Topics Concern  . Not on file  Social History Narrative  . Not on file   Social Determinants of Health   Financial Resource Strain:   . Difficulty of Paying Living Expenses: Not on file  Food Insecurity:   . Worried About Running Out of Food in the Last Year: Not on file  . Ran Out of Food in the Last Year: Not on file  Transportation Needs:   . Lack of Transportation (Medical): Not on file  . Lack of Transportation (Non-Medical): Not on file  Physical Activity:   . Days of Exercise per Week: Not on file  . Minutes of Exercise per Session: Not on  file  Stress:   . Feeling of Stress : Not on file  Social Connections:   . Frequency of Communication with Friends and Family: Not on file  . Frequency of Social Gatherings with Friends and Family: Not on file  . Attends Religious Services: Not on file  . Active Member of Clubs or Organizations: Not on file  . Attends Club or Organization Meetings: Not on file  . Marital Status: Not on file    Allergies:  No Known Allergies  Objective:      Vital Signs:   Temp:  [97.3 F (36.3 C)-98.7 F (37.1 C)] 97.6 F (36.4 C) (10/06 0600) Pulse Rate:  [86-89] 89 (10/05 2004) Resp:  [16-18] 17 (10/06 0600) BP: (92-115)/(80-92) 115/89 (10/06 0600) SpO2:  [95 %-100 %] 96 % (10/06 0600) Weight:  [90.7 kg] 90.7 kg (10/06 0409) Last BM Date: 03/17/20  Weight change: Filed Weights   03/16/20 0434 03/18/20 0409  Weight: 86.2 kg 90.7 kg    Intake/Output:   Intake/Output Summary (Last 24 hours) at 03/18/2020 0957 Last data filed at 03/18/2020 0410 Gross per 24 hour  Intake 600 ml  Output 400 ml  Net 200 ml      Physical Exam    General:  Sitting on the side of the bed. No resp difficulty HEENT: normal Neck: supple. JVP 8-9 . Carotids 2+ bilat; no bruits. No lymphadenopathy or thyromegaly appreciated. Cor: PMI nondisplaced. Regular rate & rhythm. No rubs, gallops or murmurs. Lungs: clear Abdomen: soft, nontender, nondistended. No hepatosplenomegaly. No bruits or masses. Good bowel sounds. Extremities: cool extremities, no cyanosis, clubbing, rash, edema Neuro: alert & orientedx3, cranial nerves grossly intact. moves all 4 extremities w/o difficulty. Affect pleasant   Telemetry   SR 90s   EKG    SR 85 bpm 03/17/20   Labs   Basic Metabolic Panel: Recent Labs  Lab 03/16/20 0436 03/16/20 1133 03/17/20 1248 03/18/20 0758  NA 135  --  135 134*  K 4.1  --  5.2* 4.5  CL 103  --  99 98  CO2 21*  --  17* 21*  GLUCOSE 141*  --  125* 133*  BUN 23*  --  35* 38*  CREATININE  1.63*  --  2.90* 2.19*  CALCIUM 9.1  --  9.4 9.0  MG  --  2.2 2.7* 2.3    Liver Function Tests: Recent Labs  Lab 03/16/20 0800 03/18/20 0758  AST 33 265*  ALT 39 304*  ALKPHOS 49 46  BILITOT 1.3* 1.4*  PROT 6.0* 5.5*  ALBUMIN 3.6 3.4*   No results for input(s): LIPASE, AMYLASE in the last 168 hours. No results for input(s): AMMONIA in the last 168 hours.  CBC: Recent Labs  Lab 03/16/20 0436 03/17/20 1248 03/18/20 0758  WBC 6.6 9.3 6.5  HGB 13.4 13.9 12.7*  HCT 42.8 44.7 39.2  MCV 95.7 97.2 93.8  PLT 276 265 221    Cardiac Enzymes: No results for input(s): CKTOTAL, CKMB, CKMBINDEX, TROPONINI in the last 168 hours.  BNP: BNP (last 3 results) Recent Labs    03/16/20 0800  BNP 1,394.3*    ProBNP (last 3 results) No results for input(s): PROBNP in the last 8760 hours.   CBG: No results for input(s): GLUCAP in the last 168 hours.  Coagulation Studies: No results for input(s): LABPROT, INR in the last 72 hours.   Imaging   ECHOCARDIOGRAM COMPLETE  Result Date: 03/17/2020    ECHOCARDIOGRAM REPORT   Patient Name:   Bryan Guerrero Date of Exam: 03/17/2020 Medical Rec #:  [id number]       Height:       72.0 in Accession #:    [id number]      Weight:       190.0 lb Date of Birth:  08/02/1970      BSA:          2.085 m Patient Age:    48 years        BP:             92/80 mmHg Patient Gender: M               HR:           88 bpm. Exam Location:  Inpatient Procedure: 2D Echo, Cardiac Doppler, Color Doppler and Intracardiac            Opacification Agent Indications:    Cardiomyopathy-Unspecified 425.9 / I42.9  History:        Patient has prior history of Echocardiogram examinations, most                 recent 08/05/2018. CHF and Cardiomyopathy, Arrythmias:Atrial                 Flutter, Signs/Symptoms:Dyspnea; Risk Factors:Hypertension and                 Former Smoker.  Sonographer:    Julia Swaim RDCS Referring Phys: [id number] LAURA R INGOLD IMPRESSIONS  1. Left ventricular  ejection fraction, by estimation, is <20%. The left ventricle has severely decreased function. The left ventricle demonstrates global hypokinesis. Left ventricular diastolic parameters are consistent with Grade III diastolic dysfunction (restrictive). Elevated left ventricular end-diastolic pressure.  2. Right ventricular systolic function is normal. The right ventricular size is normal. Moderately increased right ventricular wall thickness. There is normal pulmonary artery systolic pressure.  3. Left atrial size was severely dilated.  4. Right atrial size was mildly dilated.  5. The mitral valve is normal in structure. Moderate to severe mitral valve regurgitation. No evidence of mitral stenosis.  6. The aortic valve is normal in structure. Aortic valve regurgitation is not visualized. No aortic stenosis is present.  7. The inferior vena cava is dilated in size with >50% respiratory variability, suggesting right atrial pressure of 8 mmHg. FINDINGS  Left Ventricle: Left ventricular ejection fraction, by estimation, is <20%. The left ventricle has severely decreased function. The left ventricle demonstrates global hypokinesis. Definity contrast agent was given IV to delineate the left ventricular endocardial borders. The left ventricular internal cavity size was normal in size. There is no left ventricular hypertrophy. Left ventricular diastolic parameters are consistent with Grade III diastolic dysfunction (restrictive). Elevated left ventricular end-diastolic pressure. Right Ventricle: The right ventricular size is normal. Moderately increased right ventricular wall thickness. Right ventricular systolic function is normal. There is normal pulmonary artery systolic pressure. The tricuspid regurgitant velocity is 2.39 m/s, and with an assumed right atrial pressure of 8 mmHg, the estimated right ventricular systolic pressure is 30.8 mmHg. Left Atrium: Left atrial size was severely dilated. Right Atrium: Right atrial  size was mildly dilated. Pericardium: There is no evidence of pericardial effusion. Mitral Valve: The mitral valve is normal in structure. Moderate to severe mitral valve regurgitation. No evidence of mitral valve stenosis. Tricuspid Valve: The tricuspid valve is normal in structure. Tricuspid valve regurgitation is mild . No evidence of tricuspid stenosis. Aortic Valve: The aortic valve is normal in structure. Aortic valve regurgitation is not visualized. No aortic stenosis is present. Pulmonic Valve: The pulmonic valve was normal in structure. Pulmonic valve regurgitation is not visualized. No evidence of pulmonic stenosis. Aorta: The aortic root is normal in size and structure. Venous: The inferior vena cava is dilated in size with greater than 50% respiratory variability, suggesting right atrial pressure of 8 mmHg. IAS/Shunts: No atrial level shunt detected by color flow Doppler.  LEFT VENTRICLE PLAX 2D LVOT diam:     2.40 cm      Diastology LV SV:           22           LV e' medial:    2.81 cm/s LV SV Index:   11           LV E/e' medial:  28.8 LVOT Area:     4.52 cm     LV e' lateral:   4.19 cm/s                             LV E/e' lateral: 19.3  LV Volumes (MOD) LV vol d, MOD A2C: 347.0 ml LV vol d, MOD A4C: 287.0 ml LV vol s, MOD A2C: 318.0 ml LV vol s, MOD A4C: 246.0 ml LV SV MOD A2C:     29.0 ml LV SV MOD A4C:     287.0 ml LV SV MOD BP:      38.4 ml RIGHT VENTRICLE RV S prime:     3.12 cm/s TAPSE (M-mode): 1.0 cm LEFT ATRIUM              Index       RIGHT ATRIUM           Index LA Vol (A2C):   108.0 ml 51.81 ml/m RA Area:     19.90 cm LA Vol (A4C):   91.8 ml  44.04 ml/m RA Volume:   67.10 ml  32.19 ml/m LA Biplane Vol: 100.0 ml 47.97 ml/m  AORTIC VALVE LVOT Vmax:   42.00 cm/s LVOT Vmean:  27.300 cm/s LVOT VTI:    0.049 m  AORTA Ao Root diam: 3.10 cm MITRAL VALVE               TRICUSPID VALVE MV Area (PHT): 6.32 cm    TR Peak grad:   22.8 mmHg MV Decel Time: 120 msec    TR Vmax:        239.00 cm/s Bryan  Peak grad: 51.0 mmHg Bryan Mean grad: 33.0 mmHg    SHUNTS Bryan Vmax:      357.00 cm/s  Systemic VTI:  0.05 m Bryan Vmean:     266.0 cm/s   Systemic Diam: 2.40 cm MV E velocity: 81.00 cm/s MV A velocity: 29.50 cm/s MV E/A ratio:  2.75 Tiffany Witmer MD Electronically signed by Tiffany Viera East MD Signature Date/Time: 03/17/2020/3:04:41 PM    Final       Medications:     Current Medications: . amiodarone  200 mg Oral BID  . apixaban  5 mg Oral BID  . folic acid  1 mg Oral Daily  . multivitamin with minerals  1 tablet Oral Daily  . sodium chloride flush  3 mL Intravenous Q12H  . thiamine  100 mg Oral Daily     Infusions: . sodium chloride         Assessment/Plan   1. A/C Systolic Heart Failure-->Cardiogenic Shock post cardioversion ECHO has been severely reduced since 2020. EF remains 15-20%. No formal cath.  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 HIV Non Reactive. TSH ok.  -Volume status mildly elevated. Place PICC to check CO-OX and CVP.  - Hold off on spiro/dig/arni with AKI - Will need CMRI -Refer to cardiac rehab.   2. AKI  Lab Results  Component Value Date   CREATININE 2.19 (H) 03/18/2020   CREATININE 2.90 (H) 03/17/2020   CREATININE 1.63 (H) 03/16/2020  Suspect in the setting of sedated followed by shock.  Improving today    3. A flutter RVR S/P TEE   DC-CV with conversion to NSR.  Transitioned from IV amio to po amio.  - Stop eliquis and start heparin drip for procedure.   4. Elevated LFTs suspect in the setting of shock LFTS normal on admit.  ASTG 33>265 ALT 39>309   5. ETOH  Refer to [facility] for outpatient options for ETOH abuse.    Refer to cardiac rehab.   Medication concerns reviewed with patient and pharmacy team. Barriers identified: Will need to give 30 day supply of meds. He will need close follow up.   Length of Stay: 2  Amy Clegg, NP  03/18/2020, 9:57 AM  Advanced Heart Failure Team Pager [phone] (M-F; 7a - 4p)  Please contact [facility] for night-coverage after hours (4p -7a ) and weekends on [email or web address]  Patient seen with NP, agree with the above note.   Patient with history of cardiomyopathy, possibly due to ETOH. He has not been on any medications at home. Admitted with 2 wks of dyspnea and fatigue.  Quit drinking and smoking at that time, but was drinking heavily before. Admitted with atrial flutter/RVR.   He had a CTA chest without PE at admission.  He had TEE-guided DCCV yesterday.  TEE showed EF <20%, severe RV dysfunction, severe Bryan.  Post-DCCV, he developed hypotension and was cool, concerned for cardiogenic shock.  Creatinine up to 2.9.  He gradually improved, now creatinine down to 2.19. BP stable, no complaints today.  He remains in NSR.   General: NAD Neck: JVP 14-16 cm, no thyromegaly or thyroid nodule.  Lungs: Mild crackles at bases.  CV: Lateral PMI.  Heart regular S1/S2, no S3/S4, 2/6 HSM apex.  1+ ankle edema.  Abdomen: Soft, nontender, no hepatosplenomegaly, no distention.  Skin: Intact without lesions or rashes.  Neurologic: Alert and oriented x 3.  Psych: Normal affect. Extremities: No clubbing or cyanosis.  HEENT: Normal.   1. Acute on chronic systolic CHF: Biventricular failure, TEE this admission with EF<20%, severe RV dysfunction, severe Bryan. Cause of cardiomyopathy uncertain, possibly due to ETOH (heavy).  Has not been on meds at home.  Concern for low output HF, creatinine up to 2.9 yesterday possibly due to stunning post-DCCV, now improved at 2.1.  BP stable.  He is volume overloaded on exam.  - Will place PICC to follow CVP and co-ox => suspect that he may need milrinone gtt.  - Lasix 60 mg IV bid for now.  - With fall in creatinine, will add digoxin 0.125 - Add other HF meds based on creatinine and BP trend.  - Needs complete abstinence from ETOH.  - Cardiac MRI if no CAD found.  - Would evaluate for CAD, will aim for LHC/RHC if creatinine comes down (?tomorrow).  Switch anticoagulation  to heparin gtt in preparation for this.  - Not candidate for advanced therapies unless he quits drinking.  2. Atrial flutter: Now back in NSR s/p DCCV.  Suspect recurrence risk is high.  - Continue amiodarone po for now, transition to IV if he starts milrinone today.  - Transition to IV heparin today pre-cath, back to Eliquis eventually.  - Consider ablation in the future.  3. AKI on ?CKD stage 3: Possible baseline cardiorenal syndrome.  Creatinine up to 2.9 yesterday post-DCCV, possibly stunning with transient cardiogenic shock.  Seems to be doing better today, creatinine trending down.  - Volume overloaded, starting IV Lasix.  - If co-ox low, will support CO on milrinone gtt.  4. Elevated LFTs: Suspect shock liver.  5. ETOH   abuse: Strongly urged cessation.  6. Smoking: Urged cessation.   Sally Menard 03/18/2020 11:37 AM  

## 2020-03-18 NOTE — Progress Notes (Addendum)
Family Medicine Teaching Service Daily Progress Note Intern Pager: [phone]  Patient name: Bryan Guerrero Medical record number: [id number] Date of birth: September 02, 1970 Age: 49 y.o. Gender: male  Primary Care Provider: Reece Leader, DO Consultants: Cardiology  Code Status: Full  Pt Overview and Major Events to Date:  Admitted 10/4 TEE and echo complete 10/5  Assessment and Plan: Bryan Guerrero a 48 y.o.malepresenting with dyspnea and intermittent chest pain. PMH is significant for hypertension, alcoholic cardiomyopathy and congestive heart failure.  Acute Respiratory Failure Patient's condition is likely further exacerbated by lack of appropriate medical follow up and noncompliance of medications. Cardiology following. TEE demonstrated LV EF ~ <20%. The LV has severely decreased function. The LV internal cavity size was severely dilate. Severe mitral valve regurgitation. Tricuspid valve regurgitation is moderate, severely dilated LA. Echo 10/5 demonstrated Left ventricular ejection fraction, by estimation, is <20%. The left ventricle has severely decreased function. The left ventricle demonstrates  global hypokinesis. Left ventricular diastolic parameters are consistent  with Grade III diastolic dysfunction with elevated left ventricular end-diastolic pressure.  -cardiology consulted, continue to appreciate recs, advance heart failure team to follow -held lasix 40 mgBID, compression stockings  -continuous cardiac monitoring -heart healthy diet -daily weights  -strict I/Os -f/u cardiology outpatient -SW to establish care with PCP, encourage adherence to prescribed medications -discussed importance of appropriate outpatient follow up, patient demonstrates appropriate understanding and agrees to this    Acute on Chronic Systolic CHFAlcoholicNon-Ischemiccardiomyopathy  He is satting between90-100% on room air.Most recently 96% O2 sat.BNP~1400.  Echo in 2020withEF  of15-20%, severely reduced(RV and LV)systolic function, moderately dilated LV, diffuse hypokinesis with septal-lateral dyssynergy suggestive of non-ischemic cardiomyopathy. Patient has been without heart failure medication. I/O is 500. TEE showed LV EF ~ <20%. The LV has severely decreased function. The LV internal cavity size was severely dilate. Severe mitral valve regurgitation. Tricuspid valve regurgitation is moderate, severely dilated LA. Echo demonstrated Left ventricular ejection fraction, by estimation, is <20%. The left  ventricle has severely decreased function. The left ventricle demonstrates global hypokinesis. Left ventricular diastolic parameters are consistent with Grade III diastolic dysfunction. Elevated left ventricular end-diastolic pressure. Cardiology recommends cardiac cath procedure in 3-4 months after started on anticoagulation therapy.Weight gain of 5 pounds, 90.7 kg today. -cardiology following, recommends following with advance heart failure team, appreciate recommendations - held Lasix, compression stockings  - strict I's and O's  - daily weights - held Coreg 3.125 BID   New Onset Atrial Flutter  HR 88 this morning. Previous EKG concerning for A. flutter with RVR. HR 149. Patient given Lopressor in ED and then started on amiodarone gtt in the ED. Patient started on Eliquis in the ED. CHADS Vasc score of 2.  - cardiology following, continue to appreciate recommendations - held Coreg 3.125 mg BID  - Eliquis 5 mg BID  Elevated LFTs Elevated AST 265 and ALT 304, patient did not previous have significantly elevations. Likely secondary to amiodarone. No abdominal distention or ascites noted on exam.  -hepatitis panel -consider [place] if levels continue to remain elevated  HTN Normotensive this morning at 110/89. Patient was told at last admission that he had hypertension but not currently on any home meds.Previously took Losartan.Hypertensive on admission with BP  162/99.  -amiodarone 200 mg bid -held carvedilil 3.125 mg, consider restarting when appropriate -continue to monitor BP    Alcohol Abuse Patient drinks afifthof liquor over a 3-4 day course along with 2-3 cans of beer daily. Has not had any alcohol  since last week (Monday). Denies ever being hospitalized for alcohol withdrawal  -MonitorCIWA -multivitamin -thiamin 100 mg daily -folic acid 1 mg daily -continuededucation -pending UDS   AKI Cr 2.9>2.19 this morning. Cr 1.63 on admission, baseline appears to be around 1.1-1.2. Likely contributing factor in the setting of untreated hypertension and imaging contrast. -continue to monitor BMP  Hyperthyroidism TSH wnl and elevated T4 1.39. No prior diagnosis. -follow up with PCP  Tobacco Use Disorder -Patientpreviously smoking 2 black-and-mild cigars a day. Patient continues to refuse - Nicotine patch available upon request   FEN/GI: heart healthy diet PPx: Eliquis    Status is: Inpatient  Remains inpatient appropriate because:Inpatient level of care appropriate due to severity of illness   Dispo: The patient is from: Home              Anticipated d/c is to: Home              Anticipated d/c date is: 2 days              Patient currently is not medically stable to d/c.        Subjective:  Patient denies dyspnea and chest pain. States that he never received compression stockings, instructed him to wear them when he does. Denies any other concerns at this time.  Objective: Temp:  [96.1 F (35.6 C)-98.7 F (37.1 C)] 98.7 F (37.1 C) (10/05 2004) Pulse Rate:  [84-119] 89 (10/05 2004) Resp:  [15-40] 16 (10/05 2004) BP: (56-114)/(20-92) 110/89 (10/05 2004) SpO2:  [93 %-100 %] 95 % (10/05 2004) Weight:  [90.7 kg] 90.7 kg (10/06 0409) Physical Exam: General: Patient sitting upright in bed, in no acute distress. HEENT: normocephalic, atraumatic, supple neck , no JVD Cardiovascular: RRR, no murmurs or  gallops auscultated  Respiratory: diffuse crackles more prominently along the upper lobes bilaterally, breathing comfortably on room air Abdomen: soft, nontender, presence of active bowel sounds Extremities: radial and distal pulses intact bilaterally, minimal right LE edema, 1+ pitting edema left LE, mild pedal edema on the left   Laboratory: Recent Labs  Lab 03/16/20 0436 03/17/20 1248  WBC 6.6 9.3  HGB 13.4 13.9  HCT 42.8 44.7  PLT 276 265   Recent Labs  Lab 03/16/20 0436 03/16/20 0800 03/17/20 1248  NA 135  --  135  K 4.1  --  5.2*  CL 103  --  99  CO2 21*  --  17*  BUN 23*  --  35*  CREATININE 1.63*  --  2.90*  CALCIUM 9.1  --  9.4  PROT  --  6.0*  --   BILITOT  --  1.3*  --   ALKPHOS  --  49  --   ALT  --  39  --   AST  --  33  --   GLUCOSE 141*  --  125*      Imaging/Diagnostic Tests: ECHOCARDIOGRAM COMPLETE  Result Date: 03/17/2020    ECHOCARDIOGRAM REPORT   Patient Name:   Bryan Guerrero Date of Exam: 03/17/2020 Medical Rec #:  [id number]       Height:       72.0 in Accession #:    [id number]      Weight:       190.0 lb Date of Birth:  1970/12/03      BSA:          2.085 m Patient Age:    48 years        BP:  92/80 mmHg Patient Gender: M               HR:           88 bpm. Exam Location:  Inpatient Procedure: 2D Echo, Cardiac Doppler, Color Doppler and Intracardiac            Opacification Agent Indications:    Cardiomyopathy-Unspecified 425.9 / I42.9  History:        Patient has prior history of Echocardiogram examinations, most                 recent 08/05/2018. CHF and Cardiomyopathy, Arrythmias:Atrial                 Flutter, Signs/Symptoms:Dyspnea; Risk Factors:Hypertension and                 Former Smoker.  Sonographer:    Renella Cunas RDCS Referring Phys: [id number] LAURA R INGOLD IMPRESSIONS  1. Left ventricular ejection fraction, by estimation, is <20%. The left ventricle has severely decreased function. The left ventricle demonstrates global hypokinesis.  Left ventricular diastolic parameters are consistent with Grade III diastolic dysfunction (restrictive). Elevated left ventricular end-diastolic pressure.  2. Right ventricular systolic function is normal. The right ventricular size is normal. Moderately increased right ventricular wall thickness. There is normal pulmonary artery systolic pressure.  3. Left atrial size was severely dilated.  4. Right atrial size was mildly dilated.  5. The mitral valve is normal in structure. Moderate to severe mitral valve regurgitation. No evidence of mitral stenosis.  6. The aortic valve is normal in structure. Aortic valve regurgitation is not visualized. No aortic stenosis is present.  7. The inferior vena cava is dilated in size with >50% respiratory variability, suggesting right atrial pressure of 8 mmHg. FINDINGS  Left Ventricle: Left ventricular ejection fraction, by estimation, is <20%. The left ventricle has severely decreased function. The left ventricle demonstrates global hypokinesis. Definity contrast agent was given IV to delineate the left ventricular endocardial borders. The left ventricular internal cavity size was normal in size. There is no left ventricular hypertrophy. Left ventricular diastolic parameters are consistent with Grade III diastolic dysfunction (restrictive). Elevated left ventricular end-diastolic pressure. Right Ventricle: The right ventricular size is normal. Moderately increased right ventricular wall thickness. Right ventricular systolic function is normal. There is normal pulmonary artery systolic pressure. The tricuspid regurgitant velocity is 2.39 m/s, and with an assumed right atrial pressure of 8 mmHg, the estimated right ventricular systolic pressure is 30.8 mmHg. Left Atrium: Left atrial size was severely dilated. Right Atrium: Right atrial size was mildly dilated. Pericardium: There is no evidence of pericardial effusion. Mitral Valve: The mitral valve is normal in structure. Moderate  to severe mitral valve regurgitation. No evidence of mitral valve stenosis. Tricuspid Valve: The tricuspid valve is normal in structure. Tricuspid valve regurgitation is mild . No evidence of tricuspid stenosis. Aortic Valve: The aortic valve is normal in structure. Aortic valve regurgitation is not visualized. No aortic stenosis is present. Pulmonic Valve: The pulmonic valve was normal in structure. Pulmonic valve regurgitation is not visualized. No evidence of pulmonic stenosis. Aorta: The aortic root is normal in size and structure. Venous: The inferior vena cava is dilated in size with greater than 50% respiratory variability, suggesting right atrial pressure of 8 mmHg. IAS/Shunts: No atrial level shunt detected by color flow Doppler.  LEFT VENTRICLE PLAX 2D LVOT diam:     2.40 cm      Diastology LV SV:  22           LV e' medial:    2.81 cm/s LV SV Index:   11           LV E/e' medial:  28.8 LVOT Area:     4.52 cm     LV e' lateral:   4.19 cm/s                             LV E/e' lateral: 19.3  LV Volumes (MOD) LV vol d, MOD A2C: 347.0 ml LV vol d, MOD A4C: 287.0 ml LV vol s, MOD A2C: 318.0 ml LV vol s, MOD A4C: 246.0 ml LV SV MOD A2C:     29.0 ml LV SV MOD A4C:     287.0 ml LV SV MOD BP:      38.4 ml RIGHT VENTRICLE RV S prime:     3.12 cm/s TAPSE (M-mode): 1.0 cm LEFT ATRIUM              Index       RIGHT ATRIUM           Index LA Vol (A2C):   108.0 ml 51.81 ml/m RA Area:     19.90 cm LA Vol (A4C):   91.8 ml  44.04 ml/m RA Volume:   67.10 ml  32.19 ml/m LA Biplane Vol: 100.0 ml 47.97 ml/m  AORTIC VALVE LVOT Vmax:   42.00 cm/s LVOT Vmean:  27.300 cm/s LVOT VTI:    0.049 m  AORTA Ao Root diam: 3.10 cm MITRAL VALVE               TRICUSPID VALVE MV Area (PHT): 6.32 cm    TR Peak grad:   22.8 mmHg MV Decel Time: 120 msec    TR Vmax:        239.00 cm/s MR Peak grad: 51.0 mmHg MR Mean grad: 33.0 mmHg    SHUNTS MR Vmax:      357.00 cm/s  Systemic VTI:  0.05 m MR Vmean:     266.0 cm/s   Systemic Diam:  2.40 cm MV E velocity: 81.00 cm/s MV A velocity: 29.50 cm/s MV E/A ratio:  2.75 Chilton Si MD Electronically signed by Chilton Si MD Signature Date/Time: 03/17/2020/3:04:41 PM    Final    ECHO TEE  Result Date: 03/17/2020    TRANSESOPHOGEAL ECHO REPORT   Patient Name:   Bryan Guerrero Date of Exam: 03/17/2020 Medical Rec #:  [id number]       Height:       72.0 in Accession #:    [id number]      Weight:       190.0 lb Date of Birth:  [birth year]      BSA:          2.085 m Patient Age:    48 years        BP:           111/95 mmHg Patient Gender: M               HR:           114 bpm. Exam Location:  Inpatient Procedure: Transesophageal Echo, Color Doppler and Cardiac Doppler Indications:     I48.92* Unspecified atrial flutter  History:         Patient has prior history of Echocardiogram examinations, most  recent 08/05/2018. CHF, Arrythmias:Atrial Flutter; Risk                  Factors:Hypertension.  Sonographer:     Irving Burton Senior RDCS Referring Phys:  [id number] LAURA R INGOLD Diagnosing Phys: [name] MD PROCEDURE: After discussion of the risks and benefits of a TEE, an informed consent was obtained from the patient. The transesophogeal probe was passed without difficulty through the esophogus of the patient. Local oropharyngeal anesthetic was provided with Cetacaine. Sedation performed by different physician. The patient was monitored while under deep sedation. Anesthestetic sedation was provided intravenously by Anesthesiology:  of Propofol. The patient developed no complications during the procedure. A successful direct current cardioversion was performed at 120 joules with 1 attempt. IMPRESSIONS  1. Pt subsequently underwent synchronized DCCV with 120J to sinus rhythm; continue apixaban.  2. Left ventricular ejection fraction, by estimation, is <20%. The left ventricle has severely decreased function. The left ventricular internal cavity size was severely dilated.  3. Right  ventricular systolic function is severely reduced. The right ventricular size is mildly enlarged.  4. Left atrial size was severely dilated. No left atrial/left atrial appendage thrombus was detected.  5. Right atrial size was mildly dilated.  6. The mitral valve is abnormal. Severe mitral valve regurgitation.  7. Tricuspid valve regurgitation is moderate.  8. The aortic valve is tricuspid. Aortic valve regurgitation is not visualized.  9. There is mild (Grade II) plaque involving the descending aorta. FINDINGS  Left Ventricle: Left ventricular ejection fraction, by estimation, is <20%. The left ventricle has severely decreased function. The left ventricular internal cavity size was severely dilated. Right Ventricle: The right ventricular size is mildly enlarged. Right vetricular wall thickness was not assessed. Right ventricular systolic function is severely reduced. Left Atrium: Left atrial size was severely dilated. Spontaneous echo contrast was present in the left atrial appendage. No left atrial/left atrial appendage thrombus was detected. Right Atrium: Right atrial size was mildly dilated. Pericardium: Trivial pericardial effusion is present. Mitral Valve: The mitral valve is abnormal. There is mild thickening of the mitral valve leaflet(s). Severe mitral valve regurgitation. Tricuspid Valve: The tricuspid valve is normal in structure. Tricuspid valve regurgitation is moderate. Aortic Valve: The aortic valve is tricuspid. Aortic valve regurgitation is not visualized. Pulmonic Valve: The pulmonic valve was normal in structure. Pulmonic valve regurgitation is not visualized. Aorta: The aortic root is normal in size and structure. There is mild (Grade II) plaque involving the descending aorta. IAS/Shunts: No atrial level shunt detected by color flow Doppler. Additional Comments: Pt subsequently underwent synchronized DCCV with 120J to sinus rhythm; continue apixaban.  MR Peak grad:    32.9 mmHg MR Mean grad:     18.0 mmHg MR Vmax:         287.00 cm/s MR Vmean:        187.0 cm/s MR PISA:         5.09 cm MR PISA Eff ROA: 68 mm MR PISA Radius:  0.90 cm [name] MD Electronically signed by [name] MD Signature Date/Time: 03/17/2020/8:57:40 AM    Final     Reece Leader, DO 03/18/2020, 6:28 AM PGY-1, [facility] FPTS Intern pager: [phone], text pages welcome

## 2020-03-18 NOTE — Progress Notes (Signed)
Peripherally Inserted Central Catheter Placement  The IV Nurse has discussed with the patient and/or persons authorized to consent for the patient, the purpose of this procedure and the potential benefits and risks involved with this procedure.  The benefits include less needle sticks, lab draws from the catheter, and the patient may be discharged home with the catheter. Risks include, but not limited to, infection, bleeding, blood clot (thrombus formation), and puncture of an artery; nerve damage and irregular heartbeat and possibility to perform a PICC exchange if needed/ordered by physician.  Alternatives to this procedure were also discussed.  Bard Power PICC patient education guide, fact sheet on infection prevention and patient information card has been provided to patient /or left at bedside.    PICC Placement Documentation  PICC Double Lumen 03/18/20 PICC Right Basilic 41 cm 0 cm (Active)  Indication for Insertion or Continuance of Line Vasoactive infusions 03/18/20 1618  Exposed Catheter (cm) 0 cm 03/18/20 1618  Site Assessment Clean;Dry;Intact 03/18/20 1618  Lumen #1 Status Flushed;Blood return noted;Saline locked 03/18/20 1618  Lumen #2 Status Flushed;Blood return noted;Saline locked 03/18/20 1618  Dressing Type Transparent 03/18/20 1618  Dressing Status Clean;Dry;Intact 03/18/20 1618  Antimicrobial disc in place? Yes 03/18/20 1618  Dressing Change Due 03/25/20 03/18/20 1618       Audrie Gallus 03/18/2020, 4:21 PM

## 2020-03-18 NOTE — Plan of Care (Signed)
  Problem: Clinical Measurements: Goal: Respiratory complications will improve Outcome: Progressing   Problem: Activity: Goal: Risk for activity intolerance will decrease Outcome: Progressing   Problem: Elimination: Goal: Will not experience complications related to urinary retention Outcome: Progressing   Problem: Safety: Goal: Ability to remain free from injury will improve Outcome: Progressing   

## 2020-03-18 NOTE — Progress Notes (Signed)
Progress Note  Patient Name: Bryan Guerrero Date of Encounter: 03/18/2020  [facility] Cardiologist: Thurmon Fair, MD   Subjective   Feeling better.  Still short of breath with activity.  Yesterday post cardioversion, pressures were soft, extremities were cool.  Likely developed a period of worsening cardiogenic shock which seems to have resolved.  LFTs were elevated indicative of this.  Inpatient Medications    Scheduled Meds:  amiodarone  200 mg Oral BID   apixaban  5 mg Oral BID   folic acid  1 mg Oral Daily   multivitamin with minerals  1 tablet Oral Daily   sodium chloride flush  3 mL Intravenous Q12H   thiamine  100 mg Oral Daily   Continuous Infusions:  sodium chloride     PRN Meds: sodium chloride, acetaminophen, ondansetron (ZOFRAN) IV, sodium chloride flush   Vital Signs    Vitals:   03/17/20 1643 03/17/20 2004 03/18/20 0409 03/18/20 0600  BP: (!) 114/92 110/89  115/89  Pulse: 86 89    Resp: Temp: (!) 97.4 F (36.3 C) 98.7 F (37.1 C)  97.6 F (36.4 C)  TempSrc: Oral Oral    SpO2: 98% 95%  96%  Weight:   90.7 kg   Height:        Intake/Output Summary (Last 24 hours) at 03/18/2020 0930 Last data filed at 03/18/2020 0410 Gross per 24 hour  Intake 600 ml  Output 400 ml  Net 200 ml   Last 3 Weights 03/18/2020 03/16/2020 08/24/2018  Weight (lbs) 199 lb 15.3 oz 190 lb 195 lb 3.2 oz  Weight (kg) 90.7 kg 86.183 kg 88.542 kg      Telemetry    Sinus rhythm heart rate in the 90s.  Maintaining.  Post cardioversion.  Prior heart rate was in the 130s with atrial flutter.- Personally Reviewed  ECG    Sinus rhythm, T wave changes noted- Personally Reviewed  Physical Exam   GEN: No acute distress.   Neck:  Mid JVD Cardiac: RRR, no murmurs, rubs, or gallops.  Respiratory: Clear to auscultation bilaterally. GI: Soft, nontender, non-distended  MS:  Mild lower extremity edema; No deformity. Neuro:  Nonfocal  Psych: Normal affect    Labs    High Sensitivity Troponin:   Recent Labs  Lab 03/16/20 0436 03/16/20 0619  TROPONINIHS 53* 52*      Chemistry Recent Labs  Lab 03/16/20 0436 03/16/20 0800 03/17/20 1248 03/18/20 0758  NA 135  --  135 134*  K 4.1  --  5.2* 4.5  CL 103  --  99 98  CO2 21*  --  17* 21*  GLUCOSE 141*  --  125* 133*  BUN 23*  --  35* 38*  CREATININE 1.63*  --  2.90* 2.19*  CALCIUM 9.1  --  9.4 9.0  PROT  --  6.0*  --  5.5*  ALBUMIN  --  3.6  --  3.4*  AST  --  33  --  265*  ALT  --  39  --  304*  ALKPHOS  --  49  --  46  BILITOT  --  1.3*  --  1.4*  GFRNONAA 50*  --  25* 35*  GFRAA 58*  --   --   --   ANIONGAP 11  --  19* 15     Hematology Recent Labs  Lab 03/16/20 0436 03/17/20 1248 03/18/20 0758  WBC 6.6 9.3 6.5  RBC 4.47  4.60 4.18*  HGB 13.4 13.9 12.7*  HCT 42.8 44.7 39.2  MCV 95.7 97.2 93.8  MCH 30.0 30.2 30.4  MCHC 31.3 31.1 32.4  RDW 12.1 12.0 12.0  PLT 276 265 221    BNP Recent Labs  Lab 03/16/20 0800  BNP 1,394.3*     DDimer No results for input(s): DDIMER in the last 168 hours.   Radiology    CT Abdomen Pelvis Wo Contrast  Result Date: 03/16/2020 CLINICAL DATA:  Chest pain, pneumoperitoneum.  Nausea. EXAM: CT ABDOMEN AND PELVIS WITHOUT CONTRAST TECHNIQUE: Multidetector CT imaging of the abdomen and pelvis was performed following the standard protocol without IV contrast. COMPARISON:  CTA of same day. FINDINGS: Lower chest: No acute abnormality. Hepatobiliary: No focal liver abnormality is seen. No gallstones, gallbladder wall thickening, or biliary dilatation. Pancreas: Unremarkable. No pancreatic ductal dilatation or surrounding inflammatory changes. Spleen: Normal in size without focal abnormality. Adrenals/Urinary Tract: Adrenal glands are unremarkable. Kidneys are normal, without renal calculi, focal lesion, or hydronephrosis. Bladder is unremarkable. Stomach/Bowel: Stomach is within normal limits. Appendix appears normal. No evidence of bowel wall  thickening, distention, or inflammatory changes. That which was described as free air on prior CT exam of same day actually appears to represent a portion of colon that is interposed between the liver and diaphragm. There is no evidence of pneumoperitoneum. Vascular/Lymphatic: No significant vascular findings are present. No enlarged abdominal or pelvic lymph nodes. Reproductive: Prostate is unremarkable. Other: Small amount of free fluid is noted in the dependent portion of the pelvis of unknown etiology. No hernia is noted. Musculoskeletal: No acute or significant osseous findings. IMPRESSION: 1. Small amount of free fluid is noted in the dependent portion of the pelvis of unknown etiology. 2. The finding which was described as free air on prior CT exam of same day actually appears to represent a portion of colon that is interposed between the liver and diaphragm. There is no evidence of pneumoperitoneum. 3. No significant abnormality is noted in the abdomen or pelvis. Electronically Signed   By: Lupita Raider M.D.   On: 03/16/2020 10:56   ECHOCARDIOGRAM COMPLETE  Result Date: 03/17/2020    ECHOCARDIOGRAM REPORT   Patient Name:   Bryan Guerrero Date of Exam: 03/17/2020 Medical Rec #:  [id number]       Height:       72.0 in Accession #:    [id number]      Weight:       190.0 lb Date of Birth:  05-29-1971      BSA:          2.085 m Patient Age:    48 years        BP:           92/80 mmHg Patient Gender: M               HR:           88 bpm. Exam Location:  Inpatient Procedure: 2D Echo, Cardiac Doppler, Color Doppler and Intracardiac            Opacification Agent Indications:    Cardiomyopathy-Unspecified 425.9 / I42.9  History:        Patient has prior history of Echocardiogram examinations, most                 recent 08/05/2018. CHF and Cardiomyopathy, Arrythmias:Atrial                 Flutter, Signs/Symptoms:Dyspnea; Risk Factors:Hypertension  and                 Former Smoker.  Sonographer:    Renella Cunas  RDCS Referring Phys: [id number] LAURA R INGOLD IMPRESSIONS  1. Left ventricular ejection fraction, by estimation, is <20%. The left ventricle has severely decreased function. The left ventricle demonstrates global hypokinesis. Left ventricular diastolic parameters are consistent with Grade III diastolic dysfunction (restrictive). Elevated left ventricular end-diastolic pressure.  2. Right ventricular systolic function is normal. The right ventricular size is normal. Moderately increased right ventricular wall thickness. There is normal pulmonary artery systolic pressure.  3. Left atrial size was severely dilated.  4. Right atrial size was mildly dilated.  5. The mitral valve is normal in structure. Moderate to severe mitral valve regurgitation. No evidence of mitral stenosis.  6. The aortic valve is normal in structure. Aortic valve regurgitation is not visualized. No aortic stenosis is present.  7. The inferior vena cava is dilated in size with >50% respiratory variability, suggesting right atrial pressure of 8 mmHg. FINDINGS  Left Ventricle: Left ventricular ejection fraction, by estimation, is <20%. The left ventricle has severely decreased function. The left ventricle demonstrates global hypokinesis. Definity contrast agent was given IV to delineate the left ventricular endocardial borders. The left ventricular internal cavity size was normal in size. There is no left ventricular hypertrophy. Left ventricular diastolic parameters are consistent with Grade III diastolic dysfunction (restrictive). Elevated left ventricular end-diastolic pressure. Right Ventricle: The right ventricular size is normal. Moderately increased right ventricular wall thickness. Right ventricular systolic function is normal. There is normal pulmonary artery systolic pressure. The tricuspid regurgitant velocity is 2.39 m/s, and with an assumed right atrial pressure of 8 mmHg, the estimated right ventricular systolic pressure is 30.8 mmHg. Left  Atrium: Left atrial size was severely dilated. Right Atrium: Right atrial size was mildly dilated. Pericardium: There is no evidence of pericardial effusion. Mitral Valve: The mitral valve is normal in structure. Moderate to severe mitral valve regurgitation. No evidence of mitral valve stenosis. Tricuspid Valve: The tricuspid valve is normal in structure. Tricuspid valve regurgitation is mild . No evidence of tricuspid stenosis. Aortic Valve: The aortic valve is normal in structure. Aortic valve regurgitation is not visualized. No aortic stenosis is present. Pulmonic Valve: The pulmonic valve was normal in structure. Pulmonic valve regurgitation is not visualized. No evidence of pulmonic stenosis. Aorta: The aortic root is normal in size and structure. Venous: The inferior vena cava is dilated in size with greater than 50% respiratory variability, suggesting right atrial pressure of 8 mmHg. IAS/Shunts: No atrial level shunt detected by color flow Doppler.  LEFT VENTRICLE PLAX 2D LVOT diam:     2.40 cm      Diastology LV SV:         22           LV e' medial:    2.81 cm/s LV SV Index:   11           LV E/e' medial:  28.8 LVOT Area:     4.52 cm     LV e' lateral:   4.19 cm/s                             LV E/e' lateral: 19.3  LV Volumes (MOD) LV vol d, MOD A2C: 347.0 ml LV vol d, MOD A4C: 287.0 ml LV vol s, MOD A2C: 318.0 ml LV vol s, MOD  A4C: 246.0 ml LV SV MOD A2C:     29.0 ml LV SV MOD A4C:     287.0 ml LV SV MOD BP:      38.4 ml RIGHT VENTRICLE RV S prime:     3.12 cm/s TAPSE (M-mode): 1.0 cm LEFT ATRIUM              Index       RIGHT ATRIUM           Index LA Vol (A2C):   108.0 ml 51.81 ml/m RA Area:     19.90 cm LA Vol (A4C):   91.8 ml  44.04 ml/m RA Volume:   67.10 ml  32.19 ml/m LA Biplane Vol: 100.0 ml 47.97 ml/m  AORTIC VALVE LVOT Vmax:   42.00 cm/s LVOT Vmean:  27.300 cm/s LVOT VTI:    0.049 m  AORTA Ao Root diam: 3.10 cm MITRAL VALVE               TRICUSPID VALVE MV Area (PHT): 6.32 cm    TR Peak  grad:   22.8 mmHg MV Decel Time: 120 msec    TR Vmax:        239.00 cm/s MR Peak grad: 51.0 mmHg MR Mean grad: 33.0 mmHg    SHUNTS MR Vmax:      357.00 cm/s  Systemic VTI:  0.05 m MR Vmean:     266.0 cm/s   Systemic Diam: 2.40 cm MV E velocity: 81.00 cm/s MV A velocity: 29.50 cm/s MV E/A ratio:  2.75 Chilton Si MD Electronically signed by Chilton Si MD Signature Date/Time: 03/17/2020/3:04:41 PM    Final    ECHO TEE  Result Date: 03/17/2020    TRANSESOPHOGEAL ECHO REPORT   Patient Name:   Bryan Guerrero Date of Exam: 03/17/2020 Medical Rec #:  [id number]       Height:       72.0 in Accession #:    [id number]      Weight:       190.0 lb Date of Birth:  Oct 21, 1970      BSA:          2.085 m Patient Age:    48 years        BP:           111/95 mmHg Patient Gender: M               HR:           114 bpm. Exam Location:  Inpatient Procedure: Transesophageal Echo, Color Doppler and Cardiac Doppler Indications:     I48.92* Unspecified atrial flutter  History:         Patient has prior history of Echocardiogram examinations, most                  recent 08/05/2018. CHF, Arrythmias:Atrial Flutter; Risk                  Factors:Hypertension.  Sonographer:     Irving Burton Senior RDCS Referring Phys:  [id number] LAURA R INGOLD Diagnosing Phys: [name] MD PROCEDURE: After discussion of the risks and benefits of a TEE, an informed consent was obtained from the patient. The transesophogeal probe was passed without difficulty through the esophogus of the patient. Local oropharyngeal anesthetic was provided with Cetacaine. Sedation performed by different physician. The patient was monitored while under deep sedation. Anesthestetic sedation was provided intravenously by Anesthesiology: 206mg  of Propofol. The patient developed no complications during the  procedure. A successful direct current cardioversion was performed at 120 joules with 1 attempt. IMPRESSIONS  1. Pt subsequently underwent synchronized DCCV with 120J to sinus  rhythm; continue apixaban.  2. Left ventricular ejection fraction, by estimation, is <20%. The left ventricle has severely decreased function. The left ventricular internal cavity size was severely dilated.  3. Right ventricular systolic function is severely reduced. The right ventricular size is mildly enlarged.  4. Left atrial size was severely dilated. No left atrial/left atrial appendage thrombus was detected.  5. Right atrial size was mildly dilated.  6. The mitral valve is abnormal. Severe mitral valve regurgitation.  7. Tricuspid valve regurgitation is moderate.  8. The aortic valve is tricuspid. Aortic valve regurgitation is not visualized.  9. There is mild (Grade II) plaque involving the descending aorta. FINDINGS  Left Ventricle: Left ventricular ejection fraction, by estimation, is <20%. The left ventricle has severely decreased function. The left ventricular internal cavity size was severely dilated. Right Ventricle: The right ventricular size is mildly enlarged. Right vetricular wall thickness was not assessed. Right ventricular systolic function is severely reduced. Left Atrium: Left atrial size was severely dilated. Spontaneous echo contrast was present in the left atrial appendage. No left atrial/left atrial appendage thrombus was detected. Right Atrium: Right atrial size was mildly dilated. Pericardium: Trivial pericardial effusion is present. Mitral Valve: The mitral valve is abnormal. There is mild thickening of the mitral valve leaflet(s). Severe mitral valve regurgitation. Tricuspid Valve: The tricuspid valve is normal in structure. Tricuspid valve regurgitation is moderate. Aortic Valve: The aortic valve is tricuspid. Aortic valve regurgitation is not visualized. Pulmonic Valve: The pulmonic valve was normal in structure. Pulmonic valve regurgitation is not visualized. Aorta: The aortic root is normal in size and structure. There is mild (Grade II) plaque involving the descending aorta.  IAS/Shunts: No atrial level shunt detected by color flow Doppler. Additional Comments: Pt subsequently underwent synchronized DCCV with 120J to sinus rhythm; continue apixaban.  MR Peak grad:    32.9 mmHg MR Mean grad:    18.0 mmHg MR Vmax:         287.00 cm/s MR Vmean:        187.0 cm/s MR PISA:         5.09 cm MR PISA Eff ROA: 68 mm MR PISA Radius:  0.90 cm [name] MD Electronically signed by [name] MD Signature Date/Time: 03/17/2020/8:57:40 AM    Final     Cardiac Studies   EF 10 to 15%  Patient Profile     49 y.o. 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 -Yesterday developed transient period of shock, resolved post anesthesia/cardioversion/propofol. -LFTs elevated, bicarb slightly low, creatinine also elevated indicative of this phenomenon.  Cardiorenal. -Seems to be turning the corner from late evening to this morning.  Feeling better but still not at baseline. -He is not on any beta-blockers, ACE inhibitor/ARB/ARN I/spironolactone/Lasix given his recent biventricular failure, shock. -I called Dr. Shirlee Latch with advanced heart failure team to see him in consultation.  Question if he is a candidate for further advanced therapies.  Prior heavy alcohol use -Has been sober for the past 2 weeks.  We discussed today.  Acute kidney injury -Multifactorial from contrast load on admission with CT scan to negate PE as well as transient cardiogenic shock surrounding TEE cardioversion with underlying severe cardiomyopathy. -Mild improvement in creatinine this morning.  Continuing  to hold medications.       For questions or updates, please contact [facility] Please consult [email or web address] for contact info under        Signed, Donato Schultz, MD  03/18/2020, 9:30 AM

## 2020-03-18 NOTE — Progress Notes (Signed)
ANTICOAGULATION CONSULT NOTE - Initial Consult  Pharmacy Consult for Heparin Dosing  Indication: Atrial Flutter   No Known Allergies  Patient Measurements: Height: 6' (182.9 cm) Weight: 90.7 kg (199 lb 15.3 oz) IBW/kg (Calculated) : 77.6 Heparin Dosing Weight: 86.2 kg   Vital Signs: Temp: 98 F (36.7 C) (10/06 1239) Temp Source: Oral (10/06 1239) BP: 109/81 (10/06 1239) Pulse Rate: 89 (10/06 1239)  Labs: Recent Labs    03/16/20 0436 03/16/20 0436 03/16/20 0619 03/17/20 1248 03/18/20 0758  HGB 13.4   < >  --  13.9 12.7*  HCT 42.8  --   --  44.7 39.2  PLT 276  --   --  265 221  CREATININE 1.63*  --   --  2.90* 2.19*  TROPONINIHS 53*  --  52*  --   --    < > = values in this interval not displayed.    Estimated Creatinine Clearance: 46.3 mL/min (A) (by C-G formula based on SCr of 2.19 mg/dL (H)).   Medical History: Past Medical History:  Diagnosis Date  . Alcohol abuse   . Asthma   . Cardiomyopathy (HCC)   . Dyspnea   . Hypertension     Medications:  Medications Prior to Admission  Medication Sig Dispense Refill  . EPINEPHrine (PRIMATENE MIST) 0.125 MG/ACT AERO Inhale 1 puff into the lungs 3 (three) times daily as needed (shortness od breath).    . Multiple Vitamin (MULTIVITAMIN WITH MINERALS) TABS tablet Take 1 tablet by mouth daily.    . carvedilol (COREG) 3.125 MG tablet Take 1 tablet by mouth twice daily with food (Patient not taking: Reported on 03/16/2020) 60 tablet 0  . carvedilol (COREG) 6.25 MG tablet Take 1 tablet (6.25 mg total) by mouth 2 (two) times daily with a meal. (Patient not taking: Reported on 03/16/2020) 180 tablet 0  . furosemide (LASIX) 40 MG tablet TAKE 1 TABLET BY MOUTH EVERY DAY (Patient not taking: Reported on 03/16/2020) 30 tablet 0  . losartan (COZAAR) 25 MG tablet TAKE 1 TABLET BY MOUTH EVERY DAY (Patient not taking: Reported on 03/16/2020) 60 tablet 0  . metoprolol tartrate (LOPRESSOR) 50 MG tablet Take 2 hours prior to procedure  (Patient not taking: Reported on 03/16/2020) 1 tablet 0   Scheduled Medications  . amiodarone  200 mg Oral BID  . digoxin  0.125 mg Oral Daily  . folic acid  1 mg Oral Daily  . furosemide  60 mg Intravenous BID  . multivitamin with minerals  1 tablet Oral Daily  . sodium chloride flush  3 mL Intravenous Q12H  . sodium chloride flush  3 mL Intravenous Q12H  . thiamine  100 mg Oral Daily    Assessment: Patient currently requires anticoagulation due to atrial flutter. Patient has been started on eliquis 5 mg PO BID with the last dose administered 10/6 @ 0913. Need to transition to heparin in anticipation of cardiac catheterization.  Will utilize aPTTs for monitoring as heparin levels will be elevated in the setting of recent eliquis administration.   Goal of Therapy:  Heparin level  0.3-0.7 Target APTT 66-102s  Plan:  Obtain a baseline aPTT and baseline heparin level prior to start of heparin infusion.  Administer Heparin 1250 Units/hr at 10/6 2100.  Obtain an aPTT level 8 hours after the beginning of the heparin infusion (10/7 @ 0500).  Obtain a daily heparin level, daily CBC, and aPTT while on heparin.  Monitor signs and symptoms of bleeding.   Bryan Guerrero  Bryan Guerrero, PharmD-candidate  03/18/2020,1:46 PM

## 2020-03-19 ENCOUNTER — Encounter (HOSPITAL_COMMUNITY): Payer: Self-pay | Admitting: Cardiology

## 2020-03-19 ENCOUNTER — Encounter (HOSPITAL_COMMUNITY)
Admission: EM | Disposition: A | Discharge status: Home / Self Care | Payer: Self-pay | Source: Home / Self Care | Attending: Family Medicine

## 2020-03-19 DIAGNOSIS — I5023 Acute on chronic systolic (congestive) heart failure: Secondary | ICD-10-CM

## 2020-03-19 DIAGNOSIS — I5043 Acute on chronic combined systolic (congestive) and diastolic (congestive) heart failure: Secondary | ICD-10-CM | POA: Diagnosis present

## 2020-03-19 HISTORY — PX: RIGHT/LEFT HEART CATH AND CORONARY ANGIOGRAPHY: CATH118266

## 2020-03-19 LAB — COOXEMETRY PANEL
Carboxyhemoglobin: 1.1 % (ref 0.5–1.5)
Methemoglobin: 0.6 % (ref 0.0–1.5)
O2 Saturation: 56.9 %
Total hemoglobin: 12.2 g/dL (ref 12.0–16.0)

## 2020-03-19 LAB — COMPREHENSIVE METABOLIC PANEL
ALT: 229 U/L — ABNORMAL HIGH (ref 0–44)
AST: 119 U/L — ABNORMAL HIGH (ref 15–41)
Albumin: 3.1 g/dL — ABNORMAL LOW (ref 3.5–5.0)
Alkaline Phosphatase: 46 U/L (ref 38–126)
Anion gap: 8 (ref 5–15)
BUN: 33 mg/dL — ABNORMAL HIGH (ref 6–20)
CO2: 29 mmol/L (ref 22–32)
Calcium: 8.5 mg/dL — ABNORMAL LOW (ref 8.9–10.3)
Chloride: 97 mmol/L — ABNORMAL LOW (ref 98–111)
Creatinine, Ser: 1.8 mg/dL — ABNORMAL HIGH (ref 0.61–1.24)
GFR calc non Af Amer: 44 mL/min — ABNORMAL LOW (ref >60)
Glucose, Bld: 102 mg/dL — ABNORMAL HIGH (ref 70–99)
Potassium: 3.5 mmol/L (ref 3.5–5.1)
Sodium: 134 mmol/L — ABNORMAL LOW (ref 135–145)
Total Bilirubin: 1.6 mg/dL — ABNORMAL HIGH (ref 0.3–1.2)
Total Protein: 5.2 g/dL — ABNORMAL LOW (ref 6.5–8.1)

## 2020-03-19 LAB — POCT I-STAT EG7
Acid-Base Excess: 8 mmol/L — ABNORMAL HIGH (ref 0.0–2.0)
Acid-Base Excess: 9 mmol/L — ABNORMAL HIGH (ref 0.0–2.0)
Bicarbonate: 33.9 mmol/L — ABNORMAL HIGH (ref 20.0–28.0)
Bicarbonate: 34.3 mmol/L — ABNORMAL HIGH (ref 20.0–28.0)
Calcium, Ion: 1.13 mmol/L — ABNORMAL LOW (ref 1.15–1.40)
Calcium, Ion: 1.14 mmol/L — ABNORMAL LOW (ref 1.15–1.40)
HCT: 41 % (ref 39.0–52.0)
HCT: 41 % (ref 39.0–52.0)
Hemoglobin: 13.9 g/dL (ref 13.0–17.0)
Hemoglobin: 13.9 g/dL (ref 13.0–17.0)
O2 Saturation: 69 %
O2 Saturation: 75 %
Potassium: 3.5 mmol/L (ref 3.5–5.1)
Potassium: 3.5 mmol/L (ref 3.5–5.1)
Sodium: 137 mmol/L (ref 135–145)
Sodium: 137 mmol/L (ref 135–145)
TCO2: 35 mmol/L — ABNORMAL HIGH (ref 22–32)
TCO2: 36 mmol/L — ABNORMAL HIGH (ref 22–32)
pCO2, Ven: 50.4 mmHg (ref 44.0–60.0)
pCO2, Ven: 50.6 mmHg (ref 44.0–60.0)
pH, Ven: 7.436 — ABNORMAL HIGH (ref 7.250–7.430)
pH, Ven: 7.439 — ABNORMAL HIGH (ref 7.250–7.430)
pO2, Ven: 36 mmHg (ref 32.0–45.0)
pO2, Ven: 39 mmHg (ref 32.0–45.0)

## 2020-03-19 LAB — CBC
HCT: 36 % — ABNORMAL LOW (ref 39.0–52.0)
Hemoglobin: 11.6 g/dL — ABNORMAL LOW (ref 13.0–17.0)
MCH: 30.1 pg (ref 26.0–34.0)
MCHC: 32.2 g/dL (ref 30.0–36.0)
MCV: 93.5 fL (ref 80.0–100.0)
Platelets: 208 10*3/uL (ref 150–400)
RBC: 3.85 MIL/uL — ABNORMAL LOW (ref 4.22–5.81)
RDW: 11.9 % (ref 11.5–15.5)
WBC: 6.3 10*3/uL (ref 4.0–10.5)
nRBC: 0 % (ref 0.0–0.2)

## 2020-03-19 LAB — MAGNESIUM: Magnesium: 1.8 mg/dL (ref 1.7–2.4)

## 2020-03-19 LAB — HEPARIN LEVEL (UNFRACTIONATED): Heparin Unfractionated: 1.9 IU/mL — ABNORMAL HIGH (ref 0.30–0.70)

## 2020-03-19 LAB — RAPID URINE DRUG SCREEN, HOSP PERFORMED
Amphetamines: NOT DETECTED
Barbiturates: NOT DETECTED
Benzodiazepines: NOT DETECTED
Cocaine: NOT DETECTED
Opiates: NOT DETECTED
Tetrahydrocannabinol: NOT DETECTED

## 2020-03-19 LAB — APTT: aPTT: 56 seconds — ABNORMAL HIGH (ref 24–36)

## 2020-03-19 SURGERY — RIGHT/LEFT HEART CATH AND CORONARY ANGIOGRAPHY
Anesthesia: LOCAL

## 2020-03-19 MED ORDER — MIDAZOLAM HCL 2 MG/2ML IJ SOLN
INTRAMUSCULAR | Status: AC
  Filled 2020-03-19: qty 2

## 2020-03-19 MED ORDER — SODIUM CHLORIDE 0.9 % IV SOLN: 250.0000 mL | INTRAVENOUS | Status: DC | PRN

## 2020-03-19 MED ORDER — VERAPAMIL HCL 2.5 MG/ML IV SOLN
INTRAVENOUS | Status: AC
  Filled 2020-03-19: qty 2

## 2020-03-19 MED ORDER — ONDANSETRON HCL 4 MG/2ML IJ SOLN: 4.0000 mg | Freq: Four times a day (QID) | INTRAMUSCULAR | Status: DC | PRN

## 2020-03-19 MED ORDER — SODIUM CHLORIDE 0.9% FLUSH
3.0000 mL | Freq: Two times a day (BID) | INTRAVENOUS | Status: DC
  Administered 2020-03-19 – 2020-03-21 (×3): 3 mL via INTRAVENOUS

## 2020-03-19 MED ORDER — SODIUM CHLORIDE 0.9% FLUSH: 3.0000 mL | INTRAVENOUS | Status: DC | PRN

## 2020-03-19 MED ORDER — MIDAZOLAM HCL 2 MG/2ML IJ SOLN
INTRAMUSCULAR | Status: DC | PRN
  Administered 2020-03-19: 1 mg via INTRAVENOUS

## 2020-03-19 MED ORDER — POTASSIUM CHLORIDE CRYS ER 20 MEQ PO TBCR
40.0000 meq | EXTENDED_RELEASE_TABLET | Freq: Two times a day (BID) | ORAL | Status: DC
  Administered 2020-03-19 – 2020-03-20 (×4): 40 meq via ORAL
  Filled 2020-03-19 (×4): qty 2

## 2020-03-19 MED ORDER — HEPARIN SODIUM (PORCINE) 1000 UNIT/ML IJ SOLN
INTRAMUSCULAR | Status: AC
  Filled 2020-03-19: qty 1

## 2020-03-19 MED ORDER — FENTANYL CITRATE (PF) 100 MCG/2ML IJ SOLN
INTRAMUSCULAR | Status: AC
  Filled 2020-03-19: qty 2

## 2020-03-19 MED ORDER — LIDOCAINE HCL (PF) 1 % IJ SOLN
INTRAMUSCULAR | Status: DC | PRN
  Administered 2020-03-19 (×2): 2 mL

## 2020-03-19 MED ORDER — MAGNESIUM SULFATE 2 GM/50ML IV SOLN
2.0000 g | Freq: Once | INTRAVENOUS | Status: AC
  Administered 2020-03-19: 2 g via INTRAVENOUS
  Filled 2020-03-19: qty 50

## 2020-03-19 MED ORDER — SODIUM CHLORIDE 0.9 % IV SOLN: INTRAVENOUS | Status: AC

## 2020-03-19 MED ORDER — FENTANYL CITRATE (PF) 100 MCG/2ML IJ SOLN
INTRAMUSCULAR | Status: DC | PRN
Start: 2020-03-19 — End: 2020-03-19
  Administered 2020-03-19: 25 ug via INTRAVENOUS

## 2020-03-19 MED ORDER — ASPIRIN 81 MG PO CHEW
81.0000 mg | CHEWABLE_TABLET | ORAL | Status: AC
  Administered 2020-03-19: 81 mg via ORAL
  Filled 2020-03-19: qty 1

## 2020-03-19 MED ORDER — FUROSEMIDE 40 MG PO TABS
40.0000 mg | ORAL_TABLET | Freq: Every day | ORAL | Status: DC
  Administered 2020-03-20: 40 mg via ORAL
  Filled 2020-03-19: qty 1

## 2020-03-19 MED ORDER — HEPARIN SODIUM (PORCINE) 1000 UNIT/ML IJ SOLN
INTRAMUSCULAR | Status: DC | PRN
  Administered 2020-03-19: 4500 [IU] via INTRAVENOUS

## 2020-03-19 MED ORDER — APIXABAN 5 MG PO TABS
5.0000 mg | ORAL_TABLET | Freq: Two times a day (BID) | ORAL | Status: DC
  Filled 2020-03-19: qty 1

## 2020-03-19 MED ORDER — IOHEXOL 350 MG/ML SOLN
INTRAVENOUS | Status: DC | PRN
  Administered 2020-03-19: 20 mL

## 2020-03-19 MED ORDER — VERAPAMIL HCL 2.5 MG/ML IV SOLN
INTRAVENOUS | Status: DC | PRN
  Administered 2020-03-19: 10 mL via INTRA_ARTERIAL

## 2020-03-19 MED ORDER — ACETAMINOPHEN 325 MG PO TABS: 650.0000 mg | ORAL_TABLET | ORAL | Status: DC | PRN

## 2020-03-19 MED ORDER — HEPARIN (PORCINE) IN NACL 1000-0.9 UT/500ML-% IV SOLN
INTRAVENOUS | Status: AC
  Filled 2020-03-19: qty 1000

## 2020-03-19 MED ORDER — SODIUM CHLORIDE 0.9 % IV SOLN: INTRAVENOUS | Status: DC

## 2020-03-19 MED ORDER — HEPARIN (PORCINE) IN NACL 1000-0.9 UT/500ML-% IV SOLN
INTRAVENOUS | Status: DC | PRN
  Administered 2020-03-19 (×2): 500 mL

## 2020-03-19 MED ORDER — APIXABAN 5 MG PO TABS
5.0000 mg | ORAL_TABLET | Freq: Two times a day (BID) | ORAL | Status: DC
  Administered 2020-03-19 – 2020-03-26 (×14): 5 mg via ORAL
  Filled 2020-03-19 (×14): qty 1

## 2020-03-19 MED ORDER — HYDRALAZINE HCL 20 MG/ML IJ SOLN: 10.0000 mg | INTRAMUSCULAR | Status: AC | PRN

## 2020-03-19 MED ORDER — SPIRONOLACTONE 12.5 MG HALF TABLET
12.5000 mg | ORAL_TABLET | Freq: Every day | ORAL | Status: DC
  Administered 2020-03-19 – 2020-03-20 (×2): 12.5 mg via ORAL
  Filled 2020-03-19 (×3): qty 1

## 2020-03-19 MED ORDER — LABETALOL HCL 5 MG/ML IV SOLN: 10.0000 mg | INTRAVENOUS | Status: AC | PRN

## 2020-03-19 MED ORDER — LOSARTAN POTASSIUM 25 MG PO TABS
12.5000 mg | ORAL_TABLET | Freq: Every day | ORAL | Status: DC
  Administered 2020-03-19 – 2020-03-20 (×2): 12.5 mg via ORAL
  Filled 2020-03-19 (×2): qty 1

## 2020-03-19 MED ORDER — LIDOCAINE HCL (PF) 1 % IJ SOLN
INTRAMUSCULAR | Status: AC
  Filled 2020-03-19: qty 30

## 2020-03-19 SURGICAL SUPPLY — 11 items

## 2020-03-19 NOTE — Progress Notes (Signed)
Pt. With meds to start during the shift. RN unable to start medication at scheduled time. Pt. Requesting to speak with MD d/t concerns about medications. On call for Cardiology paged to make aware.

## 2020-03-19 NOTE — Progress Notes (Signed)
ANTICOAGULATION CONSULT NOTE   Pharmacy Consult for Heparin Dosing  Indication: Atrial Flutter   Assessment: Patient currently requires anticoagulation due to atrial flutter. Patient has been started on eliquis 5 mg PO BID with the last dose administered 10/6 @ 0913. Need to transition to heparin in anticipation of cardiac catheterization.  Will utilize aPTTs for monitoring as heparin levels will be elevated in the setting of recent eliquis administration.  APTT this am 56 sec  Goal of Therapy:  Heparin level  0.3-0.7 Target APTT 66-102s  Plan:  Increase Heparin drip to 1400 units/hr Check aPTT and heparin level in 6-8 hours Monitor signs and symptoms of bleeding.   Thanks for allowing pharmacy to be a part of this patient's care.  Talbert Cage, PharmD Clinical Pharmacist 03/19/2020,7:46 AM

## 2020-03-19 NOTE — Progress Notes (Addendum)
FMTS Attending Daily Note: Terisa Starr, MD  Team Pager [phone] Pager [phone]  I have seen and examined this patient, reviewed their chart. I have discussed this patient with the resident. I agree with the resident's findings, assessment and care plan.  Addendums in note below.  Additionally, has no insurance coverage. Will need to work with CM to see if eligible for MATCH.   Could consider naltrexone for EtOH abuse.   Status is: Inpatient  Remains inpatient appropriate because:IV treatments appropriate due to intensity of illness or inability to take PO   Dispo:  Patient From: Home  Planned Disposition: Home  Expected discharge date: 03/21/20  Medically stable for discharge: No   Family Medicine Teaching Service Daily Progress Note Intern Pager: [phone]  Patient name: Bryan Guerrero Medical record number: [id number] Date of birth: 1970-06-22 Age: 49 y.o. Gender: male  Primary Care Provider: Reece Leader, DO Consultants: Cardiology  Code Status: Full   Pt Overview and Major Events to Date:  Admitted 10/4 TEE and echo complete 10/5  Assessment and Plan: Dillon Bjork a 48 y.o.malepresenting with dyspnea and intermittent chest pain. PMH is significant for hypertension, alcoholic cardiomyopathy and congestive heart failure.  Acute on Chronic Heart failure with reduced ejection fraction, nonischemic cardiomyopathy, possibly alcohol use as cause.  Patient's condition is likely further exacerbated by lack of appropriate medical follow up and noncompliance of medications. Cardiology following. TEE demonstrated LV EF ~ <20%. The LV has severely decreased function. The LV internal cavity size was severely dilate. Severe mitral valve regurgitation. Tricuspid valve regurgitation is moderate, severely dilated LA. Echo 10/5 demonstrated Left ventricular ejection fraction, by estimation, is <20%. The left ventricle has severely decreased function. The left ventricle  demonstrates  global hypokinesis. Left ventricular diastolic parameters are consistent  with Grade III diastolic dysfunction with elevated left ventricular end-diastolic pressure. PICC placed for CVP. -cardiology following, continue to appreciate recs -lasix 60 IV today, starting PO 40 mg furosemide on 10/8-  milrinone, digoxin and amiodarone drip per heart failure team, potential cath vs. CMRI, appreciate their care and guidance  -compression stockings -continuous cardiac monitoring -heart healthy diet -daily weights  -strict I/Os -f/u cardiology outpatient -SW to establish care with PCP, encourage adherence to prescribed medications -discussed importance of appropriate outpatient follow up, patient demonstrates appropriate understanding and agrees to this    New Onset Atrial Flutter  HR 79 this morning. Previous EKG concerning for A. flutter with RVR. HR 149. Patient given Lopressor in ED and then started on amiodarone gtt in the ED. Patient started on Eliquis in the ED. CHADS Vasc score of 2.  - cardiology following, continue to appreciate recommendations - held Coreg 3.125 mg BID  - held Eliquis 5 mg BID, on heparin gtt until procedure then will transition back to PO   Elevated LFTs No evidence of ascites or abdominal distention on exam. Elevated AST 265>119 and ALT 304>229, patient did not previous have significantly elevations. Likely secondary to volume overload in setting of HF. Nonreactive hepatitis panel.  -monitor AST and ALT -consider [place] if levels continue to remain elevated  HTN Normotensive this morning at 116/64. Patient was told at last admission that he had hypertension but not currently on any home meds.Previously took Losartan.Hypertensive on admission with BP 162/99.  -amiodarone 200 mg bid -held carvedilil 3.125 mg, consider restarting when appropriate -continue to monitor BP    Alcohol Abuse Patient drinks afifthof liquor over a 3-4 day course  along with 2-3 cans of  beer daily. Has not had any alcohol since last week (Monday). Denies ever being hospitalized for alcohol withdrawal  UDS negaive  -MonitorCIWA -multivitamin -thiamin 100 mg daily -folic acid 1 mg daily -continuededucation  AKI Improving. Cr 2.9>2.19>1.80  this morning. Cr 1.63 on admission, baseline appears to be around 1.1-1.2. Likely due to poor perfusion/volume overload with HF  -continue to monitor BMP  Hyperthyroidism TSH wnl and elevated T4 1.39. No prior diagnosis. -follow up with PCP  Tobacco Use Disorder -Patientpreviously smoking 2 black-and-mild cigars a day. Patient continues to refuse - Nicotine patch available upon request  FEN/GI: heart healthy diet PPx: heparin   Status is: Inpatient  Remains inpatient appropriate because:Ongoing diagnostic testing needed not appropriate for outpatient work up   Dispo: The patient is from: Home              Anticipated d/c is to: Home              Anticipated d/c date is: 2 days              Patient currently is not medically stable to d/c.        Subjective:  Patient received compression stockings. Denies dyspnea, chest pain and any generalized or localized pain.   Objective: Temp:  [98 F (36.7 C)-98.6 F (37 C)] 98.4 F (36.9 C) (10/07 0600) Pulse Rate:  [89-91] 89 (10/06 1239) Resp:  [13-18] 13 (10/07 0600) BP: (97-119)/(64-82) 116/64 (10/07 0600) SpO2:  [94 %-100 %] 96 % (10/07 0600) Weight:  [86 kg] 86 kg (10/07 4650) Physical Exam: General: Patient sitting upright in bed, in no acute distress. HEENT: normocephalic, atraumatic, supple neck without lymphadenopathy  Cardiovascular: RRR, no murmurs or gallops auscultated, no evidence of JVD, capillary refill less than 2 sec Respiratory: diffuse faint crackles without focal findings, breathing comfortably on room air  Abdomen: soft , nontender, nondistended,  presence of active bowel sounds  Extremities: radial and distal  pulses strong and equal bilaterally, compression stockings in place, no LE edema noted on right, mild LE edema on the left that appears to have improved since yesterday   Laboratory: Recent Labs  Lab 03/17/20 1248 03/18/20 0758 03/19/20 0229  WBC 9.3 6.5 6.3  HGB 13.9 12.7* 11.6*  HCT 44.7 39.2 36.0*  PLT 265 221 208   Recent Labs  Lab 03/16/20 0436 03/16/20 0800 03/17/20 1248 03/18/20 0758 03/19/20 0229  NA   < >  --  135 134* 134*  K   < >  --  5.2* 4.5 3.5  CL   < >  --  99 98 97*  CO2   < >  --  17* 21* 29  BUN   < >  --  35* 38* 33*  CREATININE   < >  --  2.90* 2.19* 1.80*  CALCIUM   < >  --  9.4 9.0 8.5*  PROT  --  6.0*  --  5.5* 5.2*  BILITOT  --  1.3*  --  1.4* 1.6*  ALKPHOS  --  49  --  46 46  ALT  --  39  --  304* 229*  AST  --  33  --  265* 119*  GLUCOSE   < >  --  125* 133* 102*   < > = values in this interval not displayed.      Imaging/Diagnostic Tests: None   Reece Leader, DO 03/19/2020, 6:27 AM PGY-1, [facility] FPTS Intern pager: [phone],  text pages welcome

## 2020-03-19 NOTE — Interval H&P Note (Signed)
History and Physical Interval Note:  03/19/2020 11:04 AM  Bryan Guerrero  has presented today for surgery, with the diagnosis of heart failure.  The various methods of treatment have been discussed with the patient and family. After consideration of risks, benefits and other options for treatment, the patient has consented to  Procedure(s): RIGHT/LEFT HEART CATH AND CORONARY ANGIOGRAPHY (N/A) as a surgical intervention.  The patient's history has been reviewed, patient examined, no change in status, stable for surgery.  I have reviewed the patient's chart and labs.  Questions were answered to the patient's satisfaction.     Alexina Niccoli [employer]

## 2020-03-19 NOTE — Plan of Care (Signed)
  Problem: Activity: Goal: Risk for activity intolerance will decrease Outcome: Progressing   Problem: Coping: Goal: Level of anxiety will decrease Outcome: Progressing   Problem: Pain Managment: Goal: General experience of comfort will improve Outcome: Progressing   Problem: Safety: Goal: Ability to remain free from injury will improve 03/19/2020 2026 by Carolanne Grumbling, RN Outcome: Progressing 03/19/2020 2024 by Carolanne Grumbling, RN Outcome: Progressing   Problem: Activity: Goal: Capacity to carry out activities will improve 03/19/2020 2026 by Carolanne Grumbling, RN Outcome: Progressing 03/19/2020 2024 by Carolanne Grumbling, RN Outcome: Progressing

## 2020-03-19 NOTE — Plan of Care (Signed)
  Problem: Activity: Goal: Risk for activity intolerance will decrease Outcome: Progressing   Problem: Coping: Goal: Level of anxiety will decrease Outcome: Progressing   Problem: Pain Managment: Goal: General experience of comfort will improve Outcome: Progressing   Problem: Safety: Goal: Ability to remain free from injury will improve Outcome: Progressing   Problem: Activity: Goal: Capacity to carry out activities will improve Outcome: Progressing

## 2020-03-19 NOTE — Discharge Summary (Addendum)
Family Medicine Teaching [facility] Discharge Summary  Patient name: Bryan Guerrero Medical record number: [id number] Date of birth: [birth year] Age: 49 y.o. Gender: male Date of Admission: 03/16/2020  Date of Discharge: 03/26/2020 Admitting Physician: Reece Leader, DO  Primary Care Provider: Reece Leader, DO Consultants: Cardiology  Indication for Hospitalization: Heart Failure with Severely Reduced EF  Discharge Diagnoses/Problem List:  Acute on Chronic Systolic Heart Failure New Onset Atrial Flutter Elevated LFTs HTN  AKI Alcohol abuse   Disposition: Discharge Home  Discharge Condition: Stable  Discharge Exam: BP 119/85 (BP Location: Left Arm)    Pulse 73    Temp 99 F (37.2 C) (Oral)    Resp 18    Ht 6' (1.829 m)    Wt 84.6 kg    SpO2 100%    BMI 25.29 kg/m    Physical Exam Vitals and nursing note reviewed.  Constitutional:      General: He is not in acute distress.    Appearance: Normal appearance. He is normal weight. He is not ill-appearing, toxic-appearing or diaphoretic.  HENT:     Head: Normocephalic and atraumatic.  Cardiovascular:     Rate and Rhythm: Normal rate and regular rhythm.     Pulses: Normal pulses.          Radial pulses are 2+ on the right side and 2+ on the left side.       Dorsalis pedis pulses are 2+ on the right side and 2+ on the left side.     Heart sounds: Normal heart sounds, S1 normal and S2 normal.  Pulmonary:     Effort: Pulmonary effort is normal. No respiratory distress.     Breath sounds: Normal breath sounds. No wheezing.  Abdominal:     General: There is no distension.     Palpations: There is no mass.     Tenderness: There is no abdominal tenderness.  Musculoskeletal:     Right lower leg: No edema.     Left lower leg: No edema.  Neurological:     Mental Status: He is alert. Mental status is at baseline.  Psychiatric:        Mood and Affect: Mood normal.        Behavior: Behavior normal.        Thought Content:  Thought content normal.      Brief Hospital Course:  Acute on Chronic Systolic Heart Failure  Patient presented with dyspnea and worsening intermittent chest pain likely secondary to poor medication compliance and lack of proper medical follow up. CXR showed no acute findings. CT chest demonstrated no evidence of PE or aneurysm. CT abdomen/pelvis showed no pneumoperitoneum.  Cardiology consulted and followed throughout hospital stay, additional recommendations  also received from heart failure team. Placed on amiodarone and milrinone infusions. TEE demonstrated EF of <20%, severely decreased function of left ventricle. Severely dilated LV internal cavity with severe mitral regurgitation. Moderate tricuspid regurgitation and severely dilated LA. Echo demonstrated LV EF <20% with severely decreased LV function and global LEV hypokinesis. Also noted were LA severely dilated, RA mildly dilated and moderate to severe mitral regurgitation. Cardiac MRI demonstrated severe LV dilation with EF of 13% and moderately RV dilation with EF 20%. Right and left heart cath procedure showed mildly elevated PCWP, normal RA pressure and good CO on milrinone. No evidence of nonischemic cardiomyopathy or CAD. Pt was on milrinone drip from 10/6 to 10/12.   Discharged with spironolactone 25 mg daily, amiodarone, Entresto, farxiga,  and  digoxin 0.125 mg. Instructed to [facility] along with cardiology outpatient.   New Onset Atrial Flutter Presented with HR 149 on admission. EKG demonstrated atrial flutter and nonspecific T wave abnormalities. CHADS Vasc score of 2. Given lopressor int he ED and started on amiodarone drip and Eliquis. S/p cardioversion, HR monitored throughout hospitalization. Patient was eventually transitioned to PO amiodarone. Patient sent home with Eliquis 5 mg bid and instructed to follow up with PCP and HF clinic.   Elevated LFTs AST 265 and ALT 304 noted on labs  following administration of amiodarone and hypotensive episode after his cardiac procedure. LFTs continued to be monitored throughout hospital stay and improved. LFTs normalized prior to discharge AST 30 and ALT 52.  HTN  Hypertensive on admission at 162/99. Likely multifactorial including noncompliance of previously prescribed losartan. Given amiodarone infusion. Patient started on Entresto 24-26 mg bid prior to discharge. BP continued to be monitored closely. Normotensive at discharge at 119/85.  AKI Patient Cr 1.63 on admission. Baseline appears to be around 1.1-1.2. Likely in the setting of uncontrolled hypertension. Creatinine gradually downtrended, normalized to 1.38 on discharge.  Alcohol abuse Patient's last consumed alcohol 2 weeks prior to admission, did not exhibit any signs of withdrawal and did not have any cravings. CIWAs monitored, given thiamin and folic acid. Abstinence encouraged, social work consulted to allow patient to benefit from further resources prior to admission.   Issues for Follow Up:  1. Establish care with [facility]. Will need help applying for financial assistance.   2. Ensure pt is following up with HF clinic and compliant with medication.  3. If patient remains on amiodarone, regular monitoring TSH and LFTs with PCP. Pt to follow up  4. Follow up w/ pt regarding abstinence. Provide any resources available for assistance with abstaining from alcohol.    Significant Procedures: Right and Left Heart Cath 10/7: No significant coronary disease Left Main, LAD, and RCA. 30% Proximal LCx stenosis.   Significant Labs and Imaging:  Recent Labs  Lab 03/24/20 0545 03/25/20 0422 03/26/20 0458  WBC 5.5 5.6 5.4  HGB 13.8 14.3 13.4  HCT 43.9 45.1 43.4  PLT 205 215 215   Recent Labs  Lab 03/20/20 0500 03/20/20 1159 03/21/20 0430 03/21/20 0430 03/22/20 0620 03/22/20 0620 03/23/20 0540 03/23/20 0540 03/24/20 0545 03/24/20 0545  03/25/20 0422 03/26/20 0458  NA   < >  --  138   < > 137  --  137  --  137  --  137 139  K   < >  --  4.6   < > 4.3   < > 4.4   < > 4.5   < > 4.7 4.6  CL   < >  --  101   < > 99  --  98  --  101  --  103 104  CO2   < >  --  30   < > 31  --  30  --  29  --  28 27  GLUCOSE   < >  --  113*   < > 108*  --  107*  --  113*  --  110* 114*  BUN   < >  --  17   < > 15  --  20  --  20  --  19 17  CREATININE   < >  --  1.32*   < > 1.30*  --  1.61*  --  1.46*  --  1.30* 1.38*  CALCIUM   < >  --  8.7*   < > 8.6*  --  8.8*  --  8.7*  --  8.8* 8.9  MG  --  2.1 1.9  --   --   --   --   --   --   --   --   --   ALKPHOS   < >  --  58   < > 52  --  64  --  63  --  60 60  AST   < >  --  48*   < > 33  --  32  --  34  --  30 30  ALT   < >  --  140*   < > 101*  --  87*  --  63*  --  55* 52*  ALBUMIN   < >  --  3.0*   < > 2.8*  --  3.0*  --  2.9*  --  3.0* 3.2*   < > = values in this interval not displayed.    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   CT Abdomen Pelvis Wo Contrast:Small amount of free fluid is noted in the dependent portion of the pelvis of unknown etiology. The finding which was described as free air on prior CT exam of same day actually appears to represent a portion of colon that is interposed between the liver and diaphragm. There is no evidence of pneumoperitoneum. No significant abnormality is noted in the abdomen  or pelvis.   CT Angio Chest PE W/cm &/Or WO JA:SNKNL is pneumoperitoneum with upper abdominal ascites. Etiology for this pneumoperitoneum uncertain. Question recent surgery as a potential etiology. If patient has not had recent surgical procedure involving the abdomen or pelvis, perforated viscus must be of concern based on this appearance. No evident pulmonary embolus. No thoracic aortic aneurysm. No dissection evident; note that the contrast bolus in the aorta is not sufficient for dissection assessment. Scattered areas of ill-defined airspace opacity in the upper lobes raises question for underlying atypical organism pneumonia. Check of COVID-19 status advised in this regard. No consolidation or pleural effusions. Lower lobe bronchiectatic change bilaterally. Scattered areas of lower lobe scarring. No evident adenopathy. Cardiomegaly.   DG Chest 2 View:Similar enlargement of the cardiopericardial silhouette. No acute findings.   Results/Tests Pending at Time of Discharge: None  Discharge Medications:  Allergies as of 03/26/2020   No Known Allergies     Medication List    STOP taking these medications   carvedilol 3.125 MG tablet Commonly known as: COREG   carvedilol 6.25 MG tablet Commonly known as: COREG   furosemide 40 MG tablet Commonly known as: LASIX   losartan 25 MG tablet Commonly known as: COZAAR   metoprolol tartrate 50 MG tablet Commonly known as: LOPRESSOR     TAKE these medications   amiodarone 200 MG tablet Commonly known as: PACERONE Take 1 tablet (200 mg total) by mouth 2 (two) times  daily.   apixaban 5 MG Tabs tablet Commonly known as: ELIQUIS Take 1 tablet (5 mg total) by mouth 2 (two) times daily.   dapagliflozin propanediol 10 MG Tabs tablet Commonly known as: FARXIGA Take 1 tablet (10 mg total) by mouth daily. Start taking on: March 27, 2020   digoxin 0.125 MG tablet Commonly known as: LANOXIN Take 1 tablet (0.125 mg total) by mouth  daily. Start taking on: March 27, 2020   multivitamin with minerals Tabs tablet Take 1 tablet by mouth daily.   Primatene Mist 0.125 MG/ACT Aero Generic drug: EPINEPHrine Inhale 1 puff into the lungs 3 (three) times daily as needed (shortness od breath).   sacubitril-valsartan 24-26 MG Commonly known as: ENTRESTO Take 1 tablet by mouth 2 (two) times daily.   spironolactone 25 MG tablet Commonly known as: ALDACTONE Take 1 tablet (25 mg total) by mouth daily. Start taking on: March 27, 2020       Discharge Instructions: Please refer to Patient Instructions section of EMR for full details.  Patient was counseled important signs and symptoms that should prompt return to medical care, changes in medications, dietary instructions, activity restrictions, and follow up appointments.   Follow-Up Appointments:  Follow-up Information    [facility]. Go on 04/10/2020.   Why: @2 :30pm Contact information: [address] [place] [address] [phone]       [facility] Follow up on 04/06/2020.   Specialty: Cardiology Why: [facility] 8:30 AM  Contact information: [address] [id number] mc [address] [place] [address] [phone]              Lauro Franklin, MD 03/26/2020, 2:36 PM PGY-1, [place]  Resident Addendum I have separately seen and examined the patient.  I have discussed the findings and exam with the resident and agree with the above note.  I helped develop the management plan that is described in the resident's note and I agree with the content.    Lenor Coffin, MD PGY-3 [facility] residency program

## 2020-03-19 NOTE — Progress Notes (Addendum)
Advanced Heart Failure Rounding Note  PCP-Cardiologist: Thurmon Fair, MD   Subjective:    10/6 CO-OX 52% started on milrinone 0.25 mcg and diuresed with IV lasix. Brisk diuresis noted.   Todays CO-OX is 57%.   Denies SOB.   Objective:   Weight Range: 86 kg Body mass index is 25.71 kg/m.   Vital Signs:   Temp:  [98 F (36.7 C)-98.6 F (37 C)] 98.4 F (36.9 C) (10/07 0600) Pulse Rate:  [89-91] 89 (10/06 1239) Resp:  [13-18] 13 (10/07 0600) BP: (97-119)/(64-82) 116/64 (10/07 0600) SpO2:  [94 %-100 %] 96 % (10/07 0600) Weight:  [86 kg] 86 kg (10/07 0623) Last BM Date: 03/18/20  Weight change: Filed Weights   03/16/20 0434 03/18/20 0409 03/19/20 0623  Weight: 86.2 kg 90.7 kg 86 kg    Intake/Output:   Intake/Output Summary (Last 24 hours) at 03/19/2020 0723 Last data filed at 03/19/2020 0300 Gross per 24 hour  Intake 1026.14 ml  Output 6250 ml  Net -5223.86 ml      Physical Exam   CVP 10  General:  No resp difficulty HEENT: Normal Neck: Supple. JVP 9-10 . Carotids 2+ bilat; no bruits. No lymphadenopathy or thyromegaly appreciated. Cor: PMI nondisplaced. Regular rate & rhythm. No rubs, gallops or murmurs. Lungs: Clear Abdomen: Soft, nontender, nondistended. No hepatosplenomegaly. No bruits or masses. Good bowel sounds. Extremities: No cyanosis, clubbing, rash, edema Neuro: Alert & orientedx3, cranial nerves grossly intact. moves all 4 extremities w/o difficulty. Affect pleasant   Telemetry   SR with occasional PVCs.   EKG    N/A  Labs    CBC Recent Labs    03/18/20 0758 03/19/20 0229  WBC 6.5 6.3  HGB 12.7* 11.6*  HCT 39.2 36.0*  MCV 93.8 93.5  PLT 221 208   Basic Metabolic Panel Recent Labs    02/58/52 0758 03/19/20 0229  NA 134* 134*  K 4.5 3.5  CL 98 97*  CO2 21* 29  GLUCOSE 133* 102*  BUN 38* 33*  CREATININE 2.19* 1.80*  CALCIUM 9.0 8.5*  MG 2.3 1.8   Liver Function Tests Recent Labs    03/18/20 0758 03/19/20 0229    AST 265* 119*  ALT 304* 229*  ALKPHOS 46 46  BILITOT 1.4* 1.6*  PROT 5.5* 5.2*  ALBUMIN 3.4* 3.1*   No results for input(s): LIPASE, AMYLASE in the last 72 hours. Cardiac Enzymes No results for input(s): CKTOTAL, CKMB, CKMBINDEX, TROPONINI in the last 72 hours.  BNP: BNP (last 3 results) Recent Labs    03/16/20 0800  BNP 1,394.3*    ProBNP (last 3 results) No results for input(s): PROBNP in the last 8760 hours.   D-Dimer No results for input(s): DDIMER in the last 72 hours. Hemoglobin A1C Recent Labs    03/18/20 0758  HGBA1C 5.2   Fasting Lipid Panel No results for input(s): CHOL, HDL, LDLCALC, TRIG, CHOLHDL, LDLDIRECT in the last 72 hours. Thyroid Function Tests Recent Labs    03/16/20 1133  TSH 2.391    Other results:   Imaging    [place] EKG SITE RITE  Result Date: 03/18/2020 If Site Rite image not attached, placement could not be confirmed due to current cardiac rhythm.     Medications:     Scheduled Medications: . Chlorhexidine Gluconate Cloth  6 each Topical Daily  . digoxin  0.125 mg Oral Daily  . folic acid  1 mg Oral Daily  . furosemide  60 mg Intravenous BID  .  multivitamin with minerals  1 tablet Oral Daily  . sodium chloride flush  3 mL Intravenous Q12H  . sodium chloride flush  3 mL Intravenous Q12H  . thiamine  100 mg Oral Daily     Infusions: . sodium chloride    . sodium chloride    . sodium chloride    . amiodarone 30 mg/hr (03/19/20 0531)  . heparin 1,250 Units/hr (03/18/20 2148)  . milrinone 0.25 mcg/kg/min (03/19/20 0241)     PRN Medications:  sodium chloride, sodium chloride, acetaminophen, ondansetron (ZOFRAN) IV, sodium chloride flush, sodium chloride flush, sodium chloride flush     Assessment/Plan  1. A/C Systolic Heart Failure-->Cardiogenic Shock post cardioversion ECHO has been severely reduced since 2020. EF remains 15-20%. No formal cath.  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 HIV Non Reactive. TSH  ok.  -CVP 10. GIve one more dose of IV lasix.  - CO-OX remains marginal on milrinone 0.25 mcg. Continue dig 0.125 mg - Hold off on spiro//arni with AKI - Will need CMRI -Plan RHC.LHC today to further assess.   2. AKI  Suspect in the setting of sedated followed by shock.  Creatinine trending down.   3. A flutter RVR S/P TEE DC-CV with conversion to NSR.  Transitioned from IV amio to po amio.  - On  heparin drip for procedure then will switch back to eliquis.   4. Elevated LFTs suspect in the setting of shock LFTS normal on admit.  ASTG 33>265>119 ALT 39>309 >229  5. ETOH  Refer to HFSW for outpatient options for ETOH abuse.   Cath later this morning.    Length of Stay: 3  Bryan Clegg, NP  03/19/2020, 7:23 AM  Advanced Heart Failure Team Pager [phone] (M-F; 7a - 4p)  Please contact [facility] for night-coverage after hours (4p -7a ) and weekends on [email or web address]  Cath done today as below:  Coronary Findings  Diagnostic Dominance: Right Left Main  No significant coronary disease.  Left Anterior Descending  No significant coronary disease.  Left Circumflex  30% proximal LCx stenosis.  Right Coronary Artery  No significant coronary disease.  Intervention  No interventions have been documented. Right Heart  Right Heart Pressures RHC Procedural Findings (on milrinone 0.25): Hemodynamics (mmHg) RA mean 4 RV 32/5 PA 34/8, mean 25 PCWP mean 18 LV 103/18 AO 104/70  Oxygen saturations: PA 72% AO 100%  Cardiac Output (Fick) 6.42  Cardiac Index (Fick) 3.01 PVR 1.1 WU   General: NAD Neck: No JVD, no thyromegaly or thyroid nodule.  Lungs: Clear to auscultation bilaterally with normal respiratory effort. CV: Lateral PMI.  Heart regular S1/S2, no S3/S4, no murmur.  No peripheral edema.   Abdomen: Soft, nontender, no hepatosplenomegaly, no distention.  Skin: Intact without lesions or rashes.  Neurologic: Alert and oriented x 3.  Psych: Normal  affect. Extremities: No clubbing or cyanosis.  HEENT: Normal.   1. Acute on chronic systolic CHF: Biventricular failure, TEE this admission with EF<20%, severe RV dysfunction, severe MR. Cause of cardiomyopathy uncertain, possibly due to ETOH (heavy).  Cath today showed no significant coronary disease.  Has not been on meds at home. Co-ox initially 52% on 10/6, he was started on milrinone 0.25 and diuresed with excellent response.  Normal RA pressure and mildly elevated PCWP today, good cardiac output on milrinone.  Creatinine down to 1.8 today.  - He got 1 dose of Lasix 60 mg IV this morning, still stop IV Lasix and start Lasix 40 mg po  daily tomorrow.  - Decrease milrinone to 0.125 mcg/kg/min.  - Continue digoxin 0.125.  - Starting spironolactone 12.5 daily and losartan 12.5 daily.  - Needs complete abstinence from ETOH.  - Will need cardiac MRI for infiltrative disease/myocarditis.   - Not candidate for advanced therapies unless he quits drinking and is compliant with followup.  2. Atrial flutter: Now back in NSR s/p DCCV.  Suspect recurrence risk is high.  - Amiodarone IV while on milrinone, back to po when off.   - Has been on IV heparin gtt, restart apixaban this evening.   - Consider ablation in the future.  3. AKI on ?CKD stage 3: Possible baseline cardiorenal syndrome.  Creatinine up to 2.9  post-DCCV, possibly stunning with transient cardiogenic shock.  Now trending down since starting milrinone, 1.8 today. Only used 20 cc contrast with cath today.  4. Elevated LFTs: Suspect shock liver.  5. ETOH abuse: Strongly urged cessation.  6. Smoking: Urged cessation.   Will arrange for paramedicine to follow after discharge.    Bryan Guerrero 03/19/2020 11:50 AM

## 2020-03-20 ENCOUNTER — Encounter (HOSPITAL_COMMUNITY): Payer: Self-pay | Admitting: Cardiology

## 2020-03-20 ENCOUNTER — Inpatient Hospital Stay (HOSPITAL_COMMUNITY): Payer: Self-pay

## 2020-03-20 DIAGNOSIS — I5043 Acute on chronic combined systolic (congestive) and diastolic (congestive) heart failure: Secondary | ICD-10-CM

## 2020-03-20 LAB — MAGNESIUM: Magnesium: 2.1 mg/dL (ref 1.7–2.4)

## 2020-03-20 LAB — CBC
HCT: 38 % — ABNORMAL LOW (ref 39.0–52.0)
Hemoglobin: 11.9 g/dL — ABNORMAL LOW (ref 13.0–17.0)
MCH: 30.1 pg (ref 26.0–34.0)
MCHC: 31.3 g/dL (ref 30.0–36.0)
MCV: 96.2 fL (ref 80.0–100.0)
Platelets: 197 10*3/uL (ref 150–400)
RBC: 3.95 MIL/uL — ABNORMAL LOW (ref 4.22–5.81)
RDW: 12 % (ref 11.5–15.5)
WBC: 5.9 10*3/uL (ref 4.0–10.5)
nRBC: 0 % (ref 0.0–0.2)

## 2020-03-20 LAB — COMPREHENSIVE METABOLIC PANEL
ALT: 189 U/L — ABNORMAL HIGH (ref 0–44)
AST: 83 U/L — ABNORMAL HIGH (ref 15–41)
Albumin: 3 g/dL — ABNORMAL LOW (ref 3.5–5.0)
Alkaline Phosphatase: 61 U/L (ref 38–126)
Anion gap: 7 (ref 5–15)
BUN: 22 mg/dL — ABNORMAL HIGH (ref 6–20)
CO2: 31 mmol/L (ref 22–32)
Calcium: 8.5 mg/dL — ABNORMAL LOW (ref 8.9–10.3)
Chloride: 97 mmol/L — ABNORMAL LOW (ref 98–111)
Creatinine, Ser: 1.33 mg/dL — ABNORMAL HIGH (ref 0.61–1.24)
GFR calc non Af Amer: >60 mL/min (ref >60)
Glucose, Bld: 122 mg/dL — ABNORMAL HIGH (ref 70–99)
Potassium: 4.3 mmol/L (ref 3.5–5.1)
Sodium: 135 mmol/L (ref 135–145)
Total Bilirubin: 0.7 mg/dL (ref 0.3–1.2)
Total Protein: 5.3 g/dL — ABNORMAL LOW (ref 6.5–8.1)

## 2020-03-20 LAB — COOXEMETRY PANEL
Carboxyhemoglobin: 0.9 % (ref 0.5–1.5)
Carboxyhemoglobin: 1.2 % (ref 0.5–1.5)
Methemoglobin: 0.6 % (ref 0.0–1.5)
Methemoglobin: 0.7 % (ref 0.0–1.5)
O2 Saturation: 45.8 %
O2 Saturation: 54.2 %
Total hemoglobin: 12.5 g/dL (ref 12.0–16.0)
Total hemoglobin: 12.1 g/dL (ref 12.0–16.0)

## 2020-03-20 MED ORDER — GADOBUTROL 1 MMOL/ML IV SOLN
10.0000 mL | Freq: Once | INTRAVENOUS | Status: AC | PRN
  Administered 2020-03-20: 10 mL via INTRAVENOUS

## 2020-03-20 MED ORDER — SPIRONOLACTONE 25 MG PO TABS
25.0000 mg | ORAL_TABLET | Freq: Every day | ORAL | Status: DC
  Administered 2020-03-21 – 2020-03-26 (×6): 25 mg via ORAL
  Filled 2020-03-20 (×6): qty 1

## 2020-03-20 NOTE — Progress Notes (Signed)
Heart and Vascular Care Navigation  03/20/2020  Bryan Guerrero 11-08-1970 [id number]  Reason for Referral: Outpatient CSW consulted to met with pt regarding lack of insurance affecting ability to get medications and concerns with current substance abuse.                                                                                                    Assessment:  Pt reports he does not have insurance at this time as he was working part time until recently.  He will be eligible for insurance through his job during open enrollment this year and plans to sign up so should have insurance in January.  CSW informed pt that we would be able to help him obtain medications in outpatient setting through HF fund patient assistance funds depending on what medications he is DC'd on.  CSW also spoke with pt about his current substance abuse.  Pt states he was drinking 1/5 of liquor every week and has been drinking heavily and almost daily since the age of 9.  He states he has stopped throughout the years but usually only for a few weeks at a time.  Actually had stopped drinking a few weeks prior to this admission- states he stopped because he was tired of it and tired of feeling badly.  Pt has never attended rehab in the past but has attended [place] meetings but didn't find them very helpful.  Was already given rehab list by inpatient CSW but hasn't looked at it much at this time.  Isn't sure yet about his interest in rehab and unsure if he could go due to work schedule which is not very consistent.  Pt reports he drinks out of habit and will usually come home and drink after work because he has nothing else to do.  CSW discussed other coping mechanisms with pt and discussed his interests with him.  CSW suggested pt keep list of different chores and/or activities and when he starts to think about drinking after work to instead pick one of the items on the list to do.                                   HRT/VAS Care  Coordination    Living arrangements for the past 2 months Apartment   Lives with: Spouse   Patient Current Insurance Coverage Self-Pay   Patient Has Concern With Paying Medical Bills Yes   Patient Concerns With Medical Bills no insurance   Medical Bill Referrals: pt being evaluted for Medicaid program through financial counseling   Does Patient Have Prescription Coverage? No   Patient Prescription Assistance Programs Heart Failure Fund  will be able to utilize HF fund and will evaluate for Patient Assistance when outpatient meds known   Home Assistive Devices/Equipment None      Social History:  SDOH Screenings   Alcohol Screen: Medium Risk  . Last Alcohol Screening Score (AUDIT): 21  Depression (PHQ2-9):   . PHQ-2 Score: Not on file  Financial Resource Strain:   . Difficulty of Paying Living Expenses: Not on file  Food Insecurity: No Food Insecurity  . Worried About Charity fundraiser in the Last Year: Never true  . Ran Out of Food in the Last Year: Never true  Housing: Low Risk   . Last Housing Risk Score: 0  Physical Activity:   . Days of Exercise per Week: Not on file  . Minutes of Exercise per Session: Not on file  Social Connections:   . Frequency of Communication with Friends and Family: Not on file  . Frequency of Social Gatherings with Friends and Family: Not on file  . Attends Religious Services: Not on file  . Active Member of Clubs or Organizations: Not on file  . Attends Archivist Meetings: Not on file  . Marital Status: Not on file  Stress:   . Feeling of Stress : Not on file  Tobacco Use: Medium Risk  . Smoking Tobacco Use: Former Smoker  . Smokeless Tobacco Use: Never Used  Transportation Needs: No Transportation Needs  . Lack of Transportation (Medical): No  . Lack of Transportation (Non-Medical): No    SDOH Interventions: Financial Resources:    Pt and wife work    Haematologist Insecurity:  Landscape architect Interventions: Intervention Not Indicated  Housing Insecurity:  Housing Interventions: Intervention Not Indicated  Transportation:   Transportation Interventions: Intervention Not Indicated   Other Care Navigation Interventions:     Inpatient/Outpatient Substance Abuse Counseling/Rehab Options Provided by inpatient CSW  Provided Pharmacy assistance resources Heart Failure Fund (will be able to utilize HF fund and will evaluate for Patient Assistance when outpatient meds known)  Patient expressed Mental Health concerns None expressed  Patient Referred to: Outpatient/inpatient rehab options   Follow-up plan:    CSW to follow up with pt at first outpatient appt to further discuss insurance options, confirm he will be able to get all his medications, and continue to assess interest in rehab program.  Will continue to follow and assist as needed  Jorge Ny, [facility] Desk#: [phone] Cell#: [phone]

## 2020-03-20 NOTE — TOC Progression Note (Signed)
Transition of Care [facility]) - Progression Note    Patient Details  Name: Bryan Guerrero MRN: [id number] Date of Birth: [birth year]  Transition of Care [facility]) CM/SW Contact  Leone Haven, RN Phone Number: 03/20/2020, 4:39 PM  Clinical Narrative:    Patient has follow up at [facility] clinic also HF will be assiting him with his medications at discharge per HF CSW note.  He may need assistance with transport at dc.  TOC will continue to follow.       Expected Discharge Plan and Services         Living arrangements for the past 2 months: Apartment                                       Social Determinants of Health (SDOH) Interventions Food Insecurity Interventions: Intervention Not Indicated Housing Interventions: Intervention Not Indicated Transportation Interventions: Intervention Not Indicated Alcohol Brief Interventions/Follow-up: Alcohol Education  Readmission Risk Interventions No flowsheet data found.

## 2020-03-20 NOTE — Progress Notes (Signed)
CARDIAC REHAB PHASE I   PRE:  Rate/Rhythm: 87 SR    BP: sitting 112/84    SaO2: 100 RA  MODE:  Ambulation: 740 ft   POST:  Rate/Rhythm: 95 SR    BP: sitting 113/78     SaO2: 96 RA   Tolerated well. Thankful to ambulate. No c/o. VSS. Discussed HF booklet, which he had been reading. Gave low sodium diets and walking guidelines. Discussed smoking cessation. He was receptive. He wants to quit smoking and drinking but will need support. [phone]  Harriet Masson CES, ACSM 03/20/2020 11:54 AM

## 2020-03-20 NOTE — Progress Notes (Addendum)
Family Medicine Teaching Service Daily Progress Note Intern Pager: [phone]  Patient name: Bryan Guerrero Medical record number: [id number] Date of birth: 02/05/1971 Age: 49 y.o. Gender: male  Primary Care Provider: Reece Leader, DO Consultants: Cardiology  Code Status: Full  Pt Overview and Major Events to Date:  Admitted 10/4 TEE and echo complete 10/5 Right/left cardiac cath 10/7  Assessment and Plan: Bryan Guerrero a 48 y.o.malepresenting with dyspnea and intermittent chest pain. PMH is significant for hypertension, alcoholic cardiomyopathy and congestive heart failure.  Acute on Chronic Systolic Heart Failure with significantly reduced ejection fraction, alcoholic-related nonischemic cardiomyopathy Denies dyspnea or chest pain. Patient's condition is likely further exacerbated by lack of appropriate medical follow up and noncompliance of medications. Cardiology following. TEE demonstratedLV EF ~ <20%. The LV has severely decreased function. The LV internal cavity size was severely dilate. Severe mitral valve regurgitation. Tricuspid valve regurgitation is moderate, severely dilated LA. Echo 10/5 demonstratedLeft ventricular ejection fraction, by estimation, is <20%. The left ventricle has severely decreased function. The left ventricle demonstrates global hypokinesis. Left ventricular diastolic parameters are consistent with Grade III diastolic dysfunctionwith elevated left ventricular end-diastolic pressure.PICC placed for CVP. Right/left cath demonstrated mildly elevated PCWP, normal RA pressure with good cardiac output on milrinone 0.25. No evidence of CAD and nonischemic cardiomyopathy. Weight remains the same at 86 kg. I/O= -2245.9. -cardiology following, continue to appreciate their guidance and care -amiodarone infusion, Lasix 40 mg daily, digoxin -.125 mg daily, losartan 12.5 mg daily, milrinone infusion, spironolactone 12.5 mg daily -cardio following, appreciate  their continued recs -awaiting CMRI  -compression stockings -continuous cardiac monitoring -heart healthy diet -daily weights  -strict I/Os -f/u cardiology outpatient -SW to establish care with PCP, encourage adherence to prescribed medications -discussed importance of appropriate outpatient follow up, patient demonstrates appropriate understanding and agrees to this   New Onset Atrial Flutter HR79this morning. PreviousEKG concerning for A. flutter with RVR. HR 149. Patient given Lopressor in ED and then started on amiodarone gtt in the ED. Patient started on Eliquis in the ED. CHADS Vasc score of 2.  - cardiology following,continue toappreciate recommendations - held Coreg 3.125 mg BID  -restarted Eliquis 5 mg BID, post cath  Elevated LFTs Improving. No evidence of ascites or abdominal distention on exam. Elevated AST [id number] and ALT 304>229>189, patient did not previous have significantly elevations. Likely secondary to volume overload in setting of HF. Nonreactive hepatitis panel.  -continue monitoring AST and ALT -consider [place] if levels continue to remain elevated  HTN Normotensive this morning at120/75. Patient was told at last admission that he had hypertension but not currently on any home meds.Previously took Losartan.Hypertensive on admission with BP 162/99. -amiodarone IV infusion -losartan 12.5 mg daily -held carvedilil 3.125 mg, consider restarting when appropriate -continue to monitor BP    Alcohol Abuse Patient drinks afifthof liquor over a 3-4 day course along with 2-3 cans of beer daily. Has not had any alcohol since 10/4. Denies ever being hospitalized for alcohol withdrawal  UDS negaive  -MonitorCIWA -multivitamin -thiamin100 mgdaily -folic acid1 mgdaily -continuededucation  AKI Improving. Cr 2.9>2.19>1.80>1.33  this morning.Cr 1.63 on admission, baseline appears to be around 1.1-1.2. Likely due to poor perfusion/volume  overload with HF and previously uncontrolled HTN -continue to monitor BMP    Hyperthyroidism TSH wnl and elevated T4 1.39. No prior diagnosis. -follow up with PCP  Tobacco Use Disorder -Patientpreviously smoking 2 black-and-mild cigars a day.Patient continues to refuse - Nicotine patch available upon request  FEN/GI: heart healthy  diet PPx: Eliquis   Status is: Inpatient  Disposition: home when medically stable, likely not today   Subjective:  Denies dyspnea and chest pain. Does not have any concerns today. Discussed cath results with patient, he has no further questions at this time.  Objective: Temp:  [97.8 F (36.6 C)-98.9 F (37.2 C)] 98.4 F (36.9 C) (10/08 0526) Pulse Rate:  [67-89] 78 (10/08 0505) Resp:  [8-35] 14 (10/08 0526) BP: (101-139)/(66-93) 105/93 (10/08 0526) SpO2:  [94 %-100 %] 99 % (10/08 0526) Weight:  [86 kg] 86 kg (10/08 0453) Physical Exam: General: Patient sitting upright in bed, in no acute distress. HEENT: normocephalic, atraumatic, supple neck without evidence of lymphadenopathy  Cardiovascular: RRR, no murmurs or gallops auscultated  Respiratory: lungs clear to auscultation bilaterally  Abdomen: soft, nontender, nondistended, presence of active bowel sounds, no evidence of ascites Extremities: radial and distal pulses intact bilaterally, compression stockings in place, no visible LE edema   Laboratory: Recent Labs  Lab 03/18/20 0758 03/18/20 0758 03/19/20 0229 03/19/20 1117 03/20/20 0500  WBC 6.5  --  6.3  --  5.9  HGB 12.7*   < > 11.6* 13.9  13.9 11.9*  HCT 39.2   < > 36.0* 41.0  41.0 38.0*  PLT 221  --  208  --  197   < > = values in this interval not displayed.   Recent Labs  Lab 03/18/20 0758 03/18/20 0758 03/19/20 0229 03/19/20 1117 03/20/20 0500  NA 134*   < > 134* 137  137 135  K 4.5   < > 3.5 3.5  3.5 4.3  CL 98  --  97*  --  97*  CO2 21*  --  29  --  31  BUN 38*  --  33*  --  22*  CREATININE 2.19*  --   1.80*  --  1.33*  CALCIUM 9.0  --  8.5*  --  8.5*  PROT 5.5*  --  5.2*  --  5.3*  BILITOT 1.4*  --  1.6*  --  0.7  ALKPHOS 46  --  46  --  61  ALT 304*  --  229*  --  189*  AST 265*  --  119*  --  83*  GLUCOSE 133*  --  102*  --  122*   < > = values in this interval not displayed.      Imaging/Diagnostic Tests: CARDIAC CATHETERIZATION  Result Date: 03/19/2020 1. Mildly elevated PCWP, normal RA pressure. 2. Good cardiac output on milrinone 0.25. 3. No significant coronary disease, nonischemic cardiomyopathy.    Reece Leader, DO 03/20/2020, 6:26 AM PGY-1, [facility] Family Medicine FPTS Intern pager: [phone], text pages welcome

## 2020-03-20 NOTE — Progress Notes (Addendum)
Advanced Heart Failure Rounding Note  PCP-Cardiologist: Thurmon Fair, MD   Subjective:    10/6 CO-OX 52% started on milrinone 0.25 mcg and diuresed with IV lasix. Brisk diuresis noted.   RHC/LHC yesterday showed Normal RA pressure and mildly elevated PCWP today, good cardiac output on milrinone. CI 3. Normal cors.   Milrinone reduced down to 0.125 yesterday. Co-ox 46% today. STAT repeat pending.  SCr much lower today (reasuring) 1.80>>1.33.   CVP 9   Coronary Findings  Diagnostic Dominance: Right Left Main  No significant coronary disease.  Left Anterior Descending  No significant coronary disease.  Left Circumflex  30% proximal LCx stenosis.  Right Coronary Artery  No significant coronary disease.  Intervention  No interventions have been documented. Right Heart  Right Heart Pressures RHC Procedural Findings (on milrinone 0.25): Hemodynamics (mmHg) RA mean 4 RV 32/5 PA 34/8, mean 25 PCWP mean 18 LV 103/18 AO 104/70  Oxygen saturations: PA 72% AO 100%  Cardiac Output (Fick) 6.42  Cardiac Index (Fick) 3.01 PVR 1.1 WU     Objective:   Weight Range: 86 kg Body mass index is 25.7 kg/m.   Vital Signs:   Temp:  [97.6 F (36.4 C)-98.9 F (37.2 C)] 97.7 F (36.5 C) (10/08 1129) Pulse Rate:  [76-89] 78 (10/08 0700) Resp:  [11-20] 19 (10/08 1129) BP: (101-129)/(64-93) 113/78 (10/08 1129) SpO2:  [95 %-100 %] 99 % (10/08 1129) Weight:  [86 kg] 86 kg (10/08 0453) Last BM Date: 03/19/20  Weight change: Filed Weights   03/18/20 0409 03/19/20 0623 03/20/20 0453  Weight: 90.7 kg 86 kg 86 kg    Intake/Output:   Intake/Output Summary (Last 24 hours) at 03/20/2020 1210 Last data filed at 03/20/2020 0500 Gross per 24 hour  Intake 1874.15 ml  Output 1850 ml  Net 24.15 ml      Physical Exam   CVP 9 General:  No resp difficulty HEENT: Normal Neck: Supple. JVP ~8 cm . Carotids 2+ bilat; no bruits. No lymphadenopathy or thyromegaly  appreciated. Cor: PMI nondisplaced. Regular rate & rhythm. No rubs, gallops or murmurs. Lungs: Clear, no wheezing  Abdomen: Soft, nontender, nondistended. No hepatosplenomegaly. No bruits or masses. Good bowel sounds. Extremities: No cyanosis, clubbing, rash, edema Neuro: Alert & orientedx3, cranial nerves grossly intact. moves all 4 extremities w/o difficulty. Affect pleasant   Telemetry   NSR with occasional PVCs.   EKG    N/A  Labs    CBC Recent Labs    03/19/20 0229 03/19/20 0229 03/19/20 1117 03/20/20 0500  WBC 6.3  --   --  5.9  HGB 11.6*   < > 13.9  13.9 11.9*  HCT 36.0*   < > 41.0  41.0 38.0*  MCV 93.5  --   --  96.2  PLT 208  --   --  197   < > = values in this interval not displayed.   Basic Metabolic Panel Recent Labs    76/19/50 0758 03/18/20 0758 03/19/20 0229 03/19/20 0229 03/19/20 1117 03/20/20 0500  NA 134*   < > 134*   < > 137  137 135  K 4.5   < > 3.5   < > 3.5  3.5 4.3  CL 98   < > 97*  --   --  97*  CO2 21*   < > 29  --   --  31  GLUCOSE 133*   < > 102*  --   --  122*  BUN  38*   < > 33*  --   --  22*  CREATININE 2.19*   < > 1.80*  --   --  1.33*  CALCIUM 9.0   < > 8.5*  --   --  8.5*  MG 2.3  --  1.8  --   --   --    < > = values in this interval not displayed.   Liver Function Tests Recent Labs    03/19/20 0229 03/20/20 0500  AST 119* 83*  ALT 229* 189*  ALKPHOS 46 61  BILITOT 1.6* 0.7  PROT 5.2* 5.3*  ALBUMIN 3.1* 3.0*   No results for input(s): LIPASE, AMYLASE in the last 72 hours. Cardiac Enzymes No results for input(s): CKTOTAL, CKMB, CKMBINDEX, TROPONINI in the last 72 hours.  BNP: BNP (last 3 results) Recent Labs    03/16/20 0800  BNP 1,394.3*    ProBNP (last 3 results) No results for input(s): PROBNP in the last 8760 hours.   D-Dimer No results for input(s): DDIMER in the last 72 hours. Hemoglobin A1C Recent Labs    03/18/20 0758  HGBA1C 5.2   Fasting Lipid Panel No results for input(s): CHOL,  HDL, LDLCALC, TRIG, CHOLHDL, LDLDIRECT in the last 72 hours. Thyroid Function Tests No results for input(s): TSH, T4TOTAL, T3FREE, THYROIDAB in the last 72 hours.  Invalid input(s): FREET3  Other results:   Imaging    No results found.   Medications:     Scheduled Medications: . apixaban  5 mg Oral BID  . Chlorhexidine Gluconate Cloth  6 each Topical Daily  . digoxin  0.125 mg Oral Daily  . folic acid  1 mg Oral Daily  . furosemide  40 mg Oral Daily  . losartan  12.5 mg Oral Daily  . multivitamin with minerals  1 tablet Oral Daily  . potassium chloride  40 mEq Oral BID  . sodium chloride flush  3 mL Intravenous Q12H  . sodium chloride flush  3 mL Intravenous Q12H  . sodium chloride flush  3 mL Intravenous Q12H  . spironolactone  12.5 mg Oral Daily  . thiamine  100 mg Oral Daily    Infusions: . sodium chloride    . sodium chloride    . amiodarone 30 mg/hr (03/20/20 0435)  . milrinone 0.125 mcg/kg/min (03/19/20 1843)    PRN Medications: sodium chloride, sodium chloride, acetaminophen, sodium chloride flush, sodium chloride flush, sodium chloride flush     Assessment/Plan   1. Acute on chronic systolic CHF: Biventricular failure, TEE this admission with EF<20%, severe RV dysfunction, severe MR. Cause of cardiomyopathy uncertain, possibly due to ETOH (heavy).  Cath today showed no significant coronary disease. Has not been on meds at home. Co-ox initially 52% on 10/6, he was started on milrinone 0.25 and diuresed with excellent response.  Normal RA pressure and mildly elevated PCWP today, good cardiac output on milrinone. Milrinone decreased yesterday to 0.125, early AM co-ox low at 46.  - Continue milrinone to 0.125 mcg/kg/min and check STAT repeat co-ox. If repeat Co-ox >55%, will d/c milrinone.  - Continue digoxin 0.125.  - Increase spironolactone to 25 daily  - Continue losartan 12.5 daily.  - Needs complete abstinence from ETOH.  - Will need cardiac MRI for  infiltrative disease/myocarditis.  Will order  - Not candidate for advanced therapies unless he quits drinking and is compliant with followup.  2. Atrial flutter: Now back in NSR s/p DCCV. Suspect recurrence risk is high.  - Amiodarone  IV while on milrinone, back to po when off.   - Continue Eliquis  - Consider ablation in the future.  3. AKI on ?CKD stage 3: Possible baseline cardiorenal syndrome. Creatinine up to 2.9  post-DCCV, possibly stunning with transient cardiogenic shock.  Now trending down since starting milrinone, 1.3 today.  4. Elevated LFTs: Suspect shock liver. LFTs down-trending  5. ETOH abuse: Strongly urged cessation.  6. Smoking: Urged cessation.    Length of Stay: [address], PA-C  03/20/2020, 12:10 PM  Advanced Heart Failure Team Pager [phone] (M-F; 7a - 4p)  Please contact [facility] for night-coverage after hours (4p -7a ) and weekends on [email or web address]  Patient seen and examined with the above-signed Advanced Practice Provider and/or Housestaff. I personally reviewed laboratory data, imaging studies and relevant notes. I independently examined the patient and formulated the important aspects of the plan. I have edited the note to reflect any of my changes or salient points. I have personally discussed the plan with the patient and/or family.  Feeling better today. Cath results reviewed with him. Milrinone cut back and co-ox dropped. F/u co-ox this afternoon up to 54%. CVP 9. Maintain NSR on IV amio.   General:  Sitting up No resp difficulty HEENT: normal Neck: supple. JVP 8-9. Carotids 2+ bilat; no bruits. No lymphadenopathy or thryomegaly appreciated. Cor: PMI nondisplaced. Regular rate & rhythm. No rubs, gallops or murmurs. Lungs: clear Abdomen: soft, nontender, nondistended. No hepatosplenomegaly. No bruits or masses. Good bowel sounds. Extremities: no cyanosis, clubbing, rash, edema Neuro: alert & orientedx3, cranial nerves grossly intact.  moves all 4 extremities w/o difficulty. Affect pleasant  Remains tenuous. Cath results looked good yesterday but co-ox dropping with cutting back milrinone. Will keep at current dose for now. Increase spiro and losartan gently. Maintaining NSR on amio. (Continue IV while on milrinone). Eliquis restarted.   Arvilla Meres, MD  4:56 PM

## 2020-03-21 LAB — COMPREHENSIVE METABOLIC PANEL
ALT: 140 U/L — ABNORMAL HIGH (ref 0–44)
AST: 48 U/L — ABNORMAL HIGH (ref 15–41)
Albumin: 3 g/dL — ABNORMAL LOW (ref 3.5–5.0)
Alkaline Phosphatase: 58 U/L (ref 38–126)
Anion gap: 7 (ref 5–15)
BUN: 17 mg/dL (ref 6–20)
CO2: 30 mmol/L (ref 22–32)
Calcium: 8.7 mg/dL — ABNORMAL LOW (ref 8.9–10.3)
Chloride: 101 mmol/L (ref 98–111)
Creatinine, Ser: 1.32 mg/dL — ABNORMAL HIGH (ref 0.61–1.24)
GFR, Estimated: >60 mL/min (ref >60)
Glucose, Bld: 113 mg/dL — ABNORMAL HIGH (ref 70–99)
Potassium: 4.6 mmol/L (ref 3.5–5.1)
Sodium: 138 mmol/L (ref 135–145)
Total Bilirubin: 0.9 mg/dL (ref 0.3–1.2)
Total Protein: 4.8 g/dL — ABNORMAL LOW (ref 6.5–8.1)

## 2020-03-21 LAB — CBC
HCT: 39.4 % (ref 39.0–52.0)
Hemoglobin: 12.3 g/dL — ABNORMAL LOW (ref 13.0–17.0)
MCH: 31 pg (ref 26.0–34.0)
MCHC: 31.2 g/dL (ref 30.0–36.0)
MCV: 99.2 fL (ref 80.0–100.0)
Platelets: 194 10*3/uL (ref 150–400)
RBC: 3.97 MIL/uL — ABNORMAL LOW (ref 4.22–5.81)
RDW: 12.1 % (ref 11.5–15.5)
WBC: 5.8 10*3/uL (ref 4.0–10.5)
nRBC: 0 % (ref 0.0–0.2)

## 2020-03-21 LAB — COOXEMETRY PANEL
Carboxyhemoglobin: 1 % (ref 0.5–1.5)
Methemoglobin: 0.8 % (ref 0.0–1.5)
O2 Saturation: 43.9 %
Total hemoglobin: 12.5 g/dL (ref 12.0–16.0)

## 2020-03-21 LAB — MAGNESIUM: Magnesium: 1.9 mg/dL (ref 1.7–2.4)

## 2020-03-21 MED ORDER — MAGNESIUM SULFATE 2 GM/50ML IV SOLN
2.0000 g | Freq: Once | INTRAVENOUS | Status: AC
  Administered 2020-03-21: 2 g via INTRAVENOUS
  Filled 2020-03-21: qty 50

## 2020-03-21 MED ORDER — SACUBITRIL-VALSARTAN 24-26 MG PO TABS
1.0000 | ORAL_TABLET | Freq: Two times a day (BID) | ORAL | Status: DC
  Administered 2020-03-21 – 2020-03-26 (×11): 1 via ORAL
  Filled 2020-03-21 (×11): qty 1

## 2020-03-21 MED ORDER — FUROSEMIDE 80 MG PO TABS
80.0000 mg | ORAL_TABLET | Freq: Every day | ORAL | Status: DC
  Administered 2020-03-21 – 2020-03-23 (×3): 80 mg via ORAL
  Filled 2020-03-21 (×3): qty 1

## 2020-03-21 NOTE — Progress Notes (Signed)
CARDIAC REHAB PHASE I   PRE:  Rate/Rhythm: 83 SR    BP: sitting 113/87    SaO2:   MODE:  Ambulation: 740 ft   POST:  Rate/Rhythm: 86 SR    BP: sitting 119/89     SaO2:   Tolerated well, no c/o. Sts he read information regarding HF last night. No questions right now. Can walk independently if he gets help with monitor/lines. [phone]   Harriet Masson CES, ACSM 03/21/2020 12:07 PM

## 2020-03-21 NOTE — Progress Notes (Signed)
Family Medicine Teaching Service Daily Progress Note Intern Pager: [phone]  Patient name: Bryan Guerrero Medical record number: [id number] Date of birth: 02/12/1971 Age: 49 y.o. Gender: male  Primary Care Provider: Reece Leader, DO Consultants: Cardiology Code Status: Full  Pt Overview and Major Events to Date:  Admitted on 10/4 Right/Left Cath on 10/7  Assessment and Plan:  Bryan Guerrero a 48 yr oldmalepresenting with dyspnea and intermittent chest pain. PMHx is significant for hypertension, alcoholic cardiomyopathy and congestive heart failure.   Acute on Systolic CHF w significantly reduced EF  alcoholic-related nonischemic cardiomyopathy Advanced Heart Failure Cardiology following. TEE demonstratedLV EF ~ <20%, internal cavity size was severely dilate. Severe mitral valve regurgitation. Tricuspid valve regurgitation is moderate, severely dilated LA. Left ventricular diastolic parameters are consistent with Grade III diastolic dysfunctionwith elevated left ventricular end-diastolic pressure.PICC placed for CVP. Right/left cath demonstrated mildly elevated PCWP, normal RA pressure with good cardiac output on milrinone 0.25. No evidence of CAD and nonischemic cardiomyopathy. Net I/O= -1735 -Appreciate Cardiology recommendations -Continue Amiodarone infusion -Continue Lasix oral 40 mg daily -Continue Digoxin 0.125 mg daily -Continue Losartan 12.5 mg daily -Continue Milrinone infusion -Continue Spironolactone 12.5 mg daily -Continue compression stockings -continuous cardiac monitoring -Daily weights with strict I/Os -f/u cardiology outpatient -SW to establish care with PCP, encourage adherence to prescribed medications   New Onset Atrial Flutter HRis 81 this morning. CHADS Vasc score of 2.  -Appreciate Cardiology recommendations -Hold Coreg 3.125 mg BID  -Continue Eliquis 5 mg BID   Elevated LFTs Likely secondary to volume overload in setting of HF, patient did  not previously have significant elevations. No evidence of ascites or abdominal distention on exam. LFT's continuing to improve. Today AST is 48 and ALT is 140.  -Continue trending AST and ALT -Consider Liver US if LFT's worsen   HTN Normotensive this morning at110/82.On admission BP was 162/99. Patient was told  last admission that he had hypertension but not currently on any home meds.Previously took Losartan. -Continue Amiodarone infusion -Continue Losartan 12.5 mg daily -Continue Milrinone infusion -Continue Spironolactone 12.5 mg daily -Hold carvedilil 3.125 mg, consider restarting when appropriate -Continue to monitor BP    EtOH Abuse Patient drinks afifthof liquor over a 3-4 day course along with 2-3 cans of beer daily. Has not had any alcohol since 10/4. Denies ever being hospitalized for alcohol withdrawal. No signs of withdrawal noted. -MonitorCIWA -Continue multivitamin daily -Continue Thiamin100 mgdaily -Continue Folic acid1 mgdaily -Continueeducation   AKI Creatinine was 1.63 on admission, baseline appears to be around 1.1-1.2. Likely due to poor perfusion/volume overload with HF and previously uncontrolled HTN. Creatinine today is 1.32. -Continue to trend   Hyperthyroidism: TSH wnl and elevated T4 1.39. No prior diagnosis. -follow up outpatient with PCP   Tobacco Use Disorder - Nicotine patch available upon request   FEN/GI: Heart Healthy PPx: Eliquis  Disposition: Cards tele  Prior to Admission Living Arrangement: Home Anticipated Discharge Location: Home Barriers to Discharge: Resolution of Acute HF Anticipated discharge in approximately 2-4 day(s).   Subjective:  Interviewed patient at bedside.  He reports doing well. He reports no chest, SOB, or leg pain. Has no complaints at present.  Objective: Temp:  [97.6 F (36.4 C)-98.9 F (37.2 C)] 98.3 F (36.8 C) (10/09 0000) Pulse Rate:  [78-86] 79 (10/09 0000) Resp:  [13-19]  16 (10/09 0000) BP: (103-123)/(63-93) 119/63 (10/09 0000) SpO2:  [97 %-100 %] 98 % (10/09 0000) Weight:  [86 kg-86.5 kg] 86.5 kg (10/09 0000) Physical Exam:  Physical Exam Vitals and nursing note reviewed.  Constitutional:      General: He is not in acute distress.    Appearance: He is well-developed and normal weight. He is not ill-appearing, toxic-appearing or diaphoretic.  HENT:     Head: Normocephalic and atraumatic.  Cardiovascular:     Rate and Rhythm: Normal rate and regular rhythm.     Pulses: Normal pulses.          Radial pulses are 2+ on the right side and 2+ on the left side.       Dorsalis pedis pulses are 2+ on the right side and 2+ on the left side.     Heart sounds: Normal heart sounds, S1 normal and S2 normal. No murmur heard.   Pulmonary:     Effort: Pulmonary effort is normal. No respiratory distress.     Breath sounds: Normal breath sounds. No wheezing.  Abdominal:     General: There is no distension.     Tenderness: There is no abdominal tenderness.  Musculoskeletal:     Right lower leg: No edema.     Left lower leg: No edema.  Neurological:     Mental Status: He is alert. Mental status is at baseline.  Psychiatric:        Mood and Affect: Mood normal.        Behavior: Behavior normal.        Thought Content: Thought content normal.      Laboratory: Recent Labs  Lab 03/18/20 0758 03/18/20 0758 03/19/20 0229 03/19/20 1117 03/20/20 0500  WBC 6.5  --  6.3  --  5.9  HGB 12.7*   < > 11.6* 13.9  13.9 11.9*  HCT 39.2   < > 36.0* 41.0  41.0 38.0*  PLT 221  --  208  --  197   < > = values in this interval not displayed.   Recent Labs  Lab 03/18/20 0758 03/18/20 0758 03/19/20 0229 03/19/20 1117 03/20/20 0500  NA 134*   < > 134* 137  137 135  K 4.5   < > 3.5 3.5  3.5 4.3  CL 98  --  97*  --  97*  CO2 21*  --  29  --  31  BUN 38*  --  33*  --  22*  CREATININE 2.19*  --  1.80*  --  1.33*  CALCIUM 9.0  --  8.5*  --  8.5*  PROT 5.5*  --   5.2*  --  5.3*  BILITOT 1.4*  --  1.6*  --  0.7  ALKPHOS 46  --  46  --  61  ALT 304*  --  229*  --  189*  AST 265*  --  119*  --  83*  GLUCOSE 133*  --  102*  --  122*   < > = values in this interval not displayed.     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 RV insertion site LGE, which is a nonspecific finding often seen in setting of elevated pulmonary pressures. Moderate mitral regurgitation (regurgitant fraction 32%) Small pericardial effusion.   CT Abdomen Pelvis Wo Contrast:  Small amount of free fluid is noted in the dependent portion of the pelvis of unknown etiology. The finding which was described as free air on prior CT exam of same day actually appears to represent a portion of colon that is interposed between the liver and diaphragm. There is no evidence of pneumoperitoneum. No significant abnormality is noted in the abdomen or pelvis.   CT Angio Chest PE W/cm &/Or WO Cm: There is pneumoperitoneum with upper abdominal ascites. Etiology for this pneumoperitoneum uncertain. Question recent surgery as a potential etiology. If patient has not had recent surgical procedure involving the abdomen or pelvis, perforated viscus must be of concern based on this appearance. No evident pulmonary embolus. No thoracic aortic aneurysm. No dissection evident; note that the contrast bolus in the aorta is not sufficient for dissection assessment. Scattered areas of ill-defined airspace opacity in the upper lobes raises question for underlying atypical organism pneumonia. Check of  COVID-19 status advised in this regard. No consolidation or pleural effusions. Lower lobe bronchiectatic change bilaterally. Scattered areas of lower lobe scarring. No evident adenopathy. Cardiomegaly.   DG Chest 2 View: Similar enlargement of the cardiopericardial silhouette. No acute findings.   Lauro Franklin, MD 03/21/2020, 3:13 AM PGY-1, [facility] FPTS Intern pager: [phone], text pages welcome\

## 2020-03-21 NOTE — Progress Notes (Signed)
Advanced Heart Failure Rounding Note  PCP-Cardiologist: Thurmon Fair, MD   Subjective:    10/6 CO-OX 52% started on milrinone 0.25 mcg and diuresed with IV lasix.   RHC/LHC 10/7 showed Normal RA pressure and mildly elevated PCWP today, good cardiac output on milrinone. CI 3. Normal cors.   Remains on milrinone 0.125. Feels ok but co-ox remains low at 44% CVP 11 Denies orthopnea, CP or PND. Wants to go home. Remains in NSR   cMRI 10/8  1. Severe LV dilatation with severe systolic dysfunction (EF 13%) 2.  Moderate RV dilatation with severe systolic dysfunction (EF 20%) 3. 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 Basal septal midwall LGE, which is a scar pattern seen in nonischemic cardiomyopathies and associated with worse prognosis 5. RV insertion site LGE, which is a nonspecific finding often seen in setting of elevated pulmonary pressures 6.  Moderate mitral regurgitation (regurgitant fraction 32%) 7.  Small pericardial effusion  Objective:   Weight Range: 86.5 kg Body mass index is 25.85 kg/m.   Vital Signs:   Temp:  [97.7 F (36.5 C)-98.7 F (37.1 C)] 98.6 F (37 C) (10/09 0400) Pulse Rate:  [79-86] 81 (10/09 0400) Resp:  [16-19] 18 (10/09 0400) BP: (105-123)/(63-90) 110/82 (10/09 0400) SpO2:  [98 %-100 %] 98 % (10/09 0400) Weight:  [86.5 kg] 86.5 kg (10/09 0000) Last BM Date: 03/20/20  Weight change: Filed Weights   03/19/20 0623 03/20/20 0453 03/21/20 0000  Weight: 86 kg 86 kg 86.5 kg    Intake/Output:   Intake/Output Summary (Last 24 hours) at 03/21/2020 0700 Last data filed at 03/21/2020 0007 Gross per 24 hour  Intake 240 ml  Output 1975 ml  Net -1735 ml      Physical Exam   CVP 11 General:  Sitting on side of bed. No resp difficulty HEENT: normal Neck: supple. JVP to jaw. Carotids 2+ bilat; no bruits. No lymphadenopathy or  thryomegaly appreciated. Cor: PMI nondisplaced. Regular rate & rhythm. No rubs, gallops or murmurs. Lungs: clear Abdomen: soft, nontender, nondistended. No hepatosplenomegaly. No bruits or masses. Good bowel sounds. Extremities: no cyanosis, clubbing, rash, trace edema cool  Neuro: alert & orientedx3, cranial nerves grossly intact. moves all 4 extremities w/o difficulty. Affect pleasant  Telemetry   NSR 70-80s with occasional PVCs. Personally reviewed   Labs    CBC Recent Labs    03/20/20 0500 03/21/20 0430  WBC 5.9 5.8  HGB 11.9* 12.3*  HCT 38.0* 39.4  MCV 96.2 99.2  PLT 197 194   Basic Metabolic Panel Recent Labs    65/46/50 0229 03/20/20 0500 03/20/20 1159 03/21/20 0430  NA  --  135  --  138  K  --  4.3  --  4.6  CL  --  97*  --  101  CO2  --  31  --  30  GLUCOSE  --  122*  --  113*  BUN  --  22*  --  17  CREATININE  --  1.33*  --  1.32*  CALCIUM  --  8.5*  --  8.7*  MG   < >  --  2.1 1.9   < > = values in this interval not displayed.   Liver Function Tests Recent Labs    03/20/20 0500 03/21/20 0430  AST 83* 48*  ALT 189* 140*  ALKPHOS 61 58  BILITOT 0.7 0.9  PROT 5.3* 4.8*  ALBUMIN 3.0* 3.0*   No results for input(s): LIPASE, AMYLASE in the last 72 hours. Cardiac Enzymes No results for input(s): CKTOTAL, CKMB, CKMBINDEX, TROPONINI in the last 72 hours.  BNP: BNP (last 3 results) Recent Labs    03/16/20 0800  BNP 1,394.3*    ProBNP (last 3 results) No results for input(s): PROBNP in the last 8760 hours.   D-Dimer No results for input(s): DDIMER in the last 72 hours. Hemoglobin A1C Recent Labs    03/18/20 0758  HGBA1C 5.2   Fasting Lipid Panel No results for input(s): CHOL, HDL, LDLCALC, TRIG, CHOLHDL, LDLDIRECT in the last 72 hours. Thyroid Function Tests No results for input(s): TSH, T4TOTAL, T3FREE, THYROIDAB in the last 72 hours.  Invalid input(s): FREET3  Other results:   Imaging    MR CARDIAC MORPHOLOGY W WO  CONTRAST  Result Date: 03/20/2020 CLINICAL DATA:  Evaluate NICM EXAM: CARDIAC MRI TECHNIQUE: The patient was scanned on a 1.5 Tesla Siemens magnet. A dedicated cardiac coil was used. Functional imaging was done using Fiesta sequences. 2,3, and 4 chamber views were done to assess for RWMA's. Modified Simpson's rule using a short axis stack was used to calculate an ejection fraction on a dedicated work [employer]. The patient received 10 cc of Gadavist. After 10 minutes inversion recovery sequences were used to assess for infiltration and scar tissue. CONTRAST:  10 cc  of Gadavist FINDINGS: Left ventricle: -Severe dilatation -Hypertrabculation of mid to apical segments -Severe systolic dysfunction -Elevated ECV (37%) -Basal septal midwall LGE -RV insertion site LGE LV EF: 13% (Normal 56-78%) Absolute volumes: LV EDV: (Normal 77-195 mL) LV ESV: (Normal 19-72 mL) LV SV: 66mL (Normal 51-133 mL) CO: 4.4L/min (Normal 2.8-8.8 L/min) Indexed volumes: LV EDV: 233mL/sq-m (Normal 47-92 mL/sq-m) LV ESV: 148mL/sq-m (Normal 13-30 mL/sq-m) LV SV: 90mL/sq-m (Normal 32-62 mL/sq-m) CI: 2.1L/min/sq-m (Normal 1.7-4.2 L/min/sq-m) Right ventricle: Moderate dilatation, severe systolic dysfunction RV EF:  20% (Normal 47-74%) Absolute volumes: RV EDV: (Normal 88-227 mL) RV ESV: (Normal 23-103 mL) RV SV: 57mL (Normal 52-138 mL) CO: 4.2L/min (Normal 2.8-8.8 L/min) Indexed volumes: RV EDV: 159mL/sq-m (Normal 55-105 mL/sq-m) RV ESV: 169mL/sq-m (Normal 15-43 mL/sq-m) RV SV: 13mL/sq-m (Normal 32-64 mL/sq-m) CI: 2.0L/min/sq-m (Normal 1.7-4.2 L/min/sq-m) Left atrium: Moderate enlargement Right atrium: Mild enlargement Mitral valve: Moderate regurgitation (regurgitant fraction 32%) Aortic valve: Tricuspid. No regurgitation Tricuspid valve: Mild regurgitation Pulmonic valve: No regurgitation Aorta: Normal proximal ascending aorta Pericardium: Small effusion IMPRESSION: 1.  Severe LV dilatation with severe  systolic dysfunction (EF 13%) 2.  Moderate RV dilatation with severe systolic dysfunction (EF 20%) 3. 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 Basal septal midwall LGE, which is a scar pattern seen in nonischemic cardiomyopathies and associated with worse prognosis 5. RV insertion site LGE, which is a nonspecific finding often seen in setting of elevated pulmonary pressures 6.  Moderate mitral regurgitation (regurgitant fraction 32%) 7.  Small pericardial effusion Electronically Signed   By: Epifanio Lesches MD   On: 03/20/2020 21:25     Medications:     Scheduled Medications: . apixaban  5 mg Oral BID  . Chlorhexidine Gluconate Cloth  6 each Topical Daily  . digoxin  0.125 mg Oral Daily  . folic acid  1 mg Oral Daily  . furosemide  40 mg Oral Daily  . losartan  12.5 mg Oral Daily  . multivitamin with minerals  1 tablet Oral  Daily  . potassium chloride  40 mEq Oral BID  . sodium chloride flush  3 mL Intravenous Q12H  . sodium chloride flush  3 mL Intravenous Q12H  . sodium chloride flush  3 mL Intravenous Q12H  . spironolactone  25 mg Oral Daily  . thiamine  100 mg Oral Daily    Infusions: . sodium chloride    . sodium chloride    . amiodarone 30 mg/hr (03/21/20 0347)  . milrinone 0.125 mcg/kg/min (03/20/20 1310)    PRN Medications: sodium chloride, sodium chloride, acetaminophen, sodium chloride flush, sodium chloride flush, sodium chloride flush     Assessment/Plan   1. Acute on chronic systolic CHF: Biventricular failure, TEE this admission with EF<20%, severe RV dysfunction, severe MR. Cause of cardiomyopathy uncertain, possibly due to ETOH (heavy).  Cath today showed no significant coronary disease. Has not been on meds at home. Co-ox initially 52% on 10/6, he was started on milrinone 0.25 and diuresed with excellent response.  Normal RA pressure and mildly  elevated PCWP today, good cardiac output on milrinone. Milrinone decreased to 0.125 on 10/7, early AM co-ox low remains low at 44%. CVP 10-11 range.Creatinine stable at 1.3  - Continue milrinone to 0.125 mcg/kg/min and check repeat co-ox. - Continue digoxin 0.125.  - Continue spironolactone 25 daily  - Switch losartan to Entresto 24/26 bid - Increase lasix to 80po daily  - Needs complete abstinence from ETOH.  - cMRI c/w ETOH CM  - Not candidate for advanced therapies unless he quits drinking and is compliant with followup.  2. Atrial flutter: Remains in NSR s/p DCCV. Suspect recurrence risk is high.  - Continue Amiodarone IV while on milrinone, back to po when off.   - Continue Eliquis. No bleeding - Consider ablation in the future.  3. AKI on ?CKD stage 3: Possible baseline cardiorenal syndrome. Creatinine up to 2.9  post-DCCV, possibly stunning with transient cardiogenic shock.  Now trending down since starting milrinone. Stable at 1.3 today  4. Elevated LFTs: Suspect shock liver. LFTs down-trending today   5. ETOH abuse: Strongly urged cessation.  6. Smoking: Urged cessation.    Length of Stay: 5  Arvilla Meres, MD  03/21/2020, 7:00 AM  Advanced Heart Failure Team Pager [phone] (M-F; 7a - 4p)  Please contact [facility] for night-coverage after hours (4p -7a ) and weekends on [email or web address]

## 2020-03-22 LAB — COMPREHENSIVE METABOLIC PANEL
ALT: 101 U/L — ABNORMAL HIGH (ref 0–44)
AST: 33 U/L (ref 15–41)
Albumin: 2.8 g/dL — ABNORMAL LOW (ref 3.5–5.0)
Alkaline Phosphatase: 52 U/L (ref 38–126)
Anion gap: 7 (ref 5–15)
BUN: 15 mg/dL (ref 6–20)
CO2: 31 mmol/L (ref 22–32)
Calcium: 8.6 mg/dL — ABNORMAL LOW (ref 8.9–10.3)
Chloride: 99 mmol/L (ref 98–111)
Creatinine, Ser: 1.3 mg/dL — ABNORMAL HIGH (ref 0.61–1.24)
GFR, Estimated: >60 mL/min (ref >60)
Glucose, Bld: 108 mg/dL — ABNORMAL HIGH (ref 70–99)
Potassium: 4.3 mmol/L (ref 3.5–5.1)
Sodium: 137 mmol/L (ref 135–145)
Total Bilirubin: 0.9 mg/dL (ref 0.3–1.2)
Total Protein: 5 g/dL — ABNORMAL LOW (ref 6.5–8.1)

## 2020-03-22 LAB — COOXEMETRY PANEL
Carboxyhemoglobin: 1.3 % (ref 0.5–1.5)
Methemoglobin: 0.7 % (ref 0.0–1.5)
O2 Saturation: 58.7 %
Total hemoglobin: 13.6 g/dL (ref 12.0–16.0)

## 2020-03-22 LAB — CBC
HCT: 41.6 % (ref 39.0–52.0)
Hemoglobin: 13.1 g/dL (ref 13.0–17.0)
MCH: 30.3 pg (ref 26.0–34.0)
MCHC: 31.5 g/dL (ref 30.0–36.0)
MCV: 96.3 fL (ref 80.0–100.0)
Platelets: 197 10*3/uL (ref 150–400)
RBC: 4.32 MIL/uL (ref 4.22–5.81)
RDW: 11.9 % (ref 11.5–15.5)
WBC: 5.8 10*3/uL (ref 4.0–10.5)
nRBC: 0 % (ref 0.0–0.2)

## 2020-03-22 NOTE — Progress Notes (Signed)
FMTS Attending Daily Note: Terisa Starr, MD  Team Pager [phone] Pager [phone]  I have seen and examined this patient, reviewed their chart. I have discussed this patient with the resident. I agree with the resident's findings, assessment and care plan.  Edits are within note.   Status is: Inpatient  Remains inpatient appropriate because:Inpatient level of care appropriate due to severity of illness   Dispo:  Patient From: Home  Planned Disposition: Home  Expected discharge date: 03/24/20  Medically stable for discharge: No  Family Medicine Teaching Service Daily Progress Note Intern Pager: [phone]  Patient name: Bryan Guerrero Medical record number: [id number] Date of birth: Apr 23, 1971 Age: 49 y.o. Gender: male  Primary Care Provider: Reece Leader, DO Consultants: Cardiology Code Status: Full  Pt Overview and Major Events to Date:  Admitted on 10/4 Started on milrinone and amiodarone gtt 10/6 Right/Left Cath on 10/7, no CAD   Assessment and Plan:  Mr. Shabazz a 48 yr oldmalepresenting with dyspnea and intermittent chest pain. PMHx is significant for hypertension, alcoholic cardiomyopathy and congestive heart failure.  Acute on Systolic CHF  Biventricular Heartfailure  Alcoholic-related nonischemic cardiomyopathy No CP, orthopnea or SOB this morning. TEE demonstratedLV EF ~ <20% and G3DD. PICC placed for CVP. Right/left cath demonstrated no CAD, mildly elevated PCWP. cMRI consistent with EtOH cardiomyopathy.  He has good cardiac ouput on milrinone 0.25. Patient down ~11.5 L since admission. Weight continues to down trend. Cr 1.30 today.  -Advanced heart failure team following, appreciate recommendations -Continue Lasix oral 80 mg daily -Continue Digoxin 0.125 mg daily -Entresto 24-26 mg -Continue Milrinone infusion, monitor Co-Ox, per HF  - Continue Amiodaron gtt -Continue Spironolactone 25 mg daily -Continue compression stockings -continuous cardiac  monitoring -Daily weights with strict I/Os -f/u cardiology outpatient -SW to establish care with PCP, encourage adherence to prescribed medications  New Onset Atrial Flutter Rate controlled s/p cardioversion. CHADS Vasc score of 2.  -Appreciate Cardiology recommendations -Hold Coreg 3.125 mg BID  -Continue Eliquis 5 mg BID - Amiodarone gtt  Elevated LFTs Likely secondary to volume overload in setting of HF, patient did not previously have significant elevations. Acute hepatitis panel was negative. LFTs improving. AST 33 and ALT 140. T bili 0.9.  -AM CMP -Consider RUQ [place] if LFT's worsen  HTN Normotensive. Patient was told  last admission that he had hypertension and was started on Losartan. No home meds. -Continue Amiodarone infusion -ARNI per HF  -Continue Milrinone infusion -Continue Spironolactone 12.5 mg daily -Hold carvedilil 3.125 mg, consider restarting when appropriate -Continue to monitor BP   EtOH Abuse Patient drinks afifthof liquor over a 3-4 day course along with 2-3 cans of beer daily. Has not had any alcohol since 10/4. Denies ever being hospitalized for alcohol withdrawal.  -Continue multivitamin daily -Continue Thiamin100 mgdaily -Continue Folic acid1 mgdaily -Continueto encourage abstinence from alcohol  -TOC consult for EtOH resources   AKI, near baseline from 2020.  Creatinine was 1.63 on admission, baseline appears to be around 1.1-1.2. Likely due to poor perfusion/volume overload with HF and previously uncontrolled HTN. Creatinine today is 1.30 from 1.32 yesterday.  -Continue to trend Cr with AM CMP  - Avoid nephrotoxic agents   Non thyroidal illness syndrome  TSH wnl and elevated T4 1.39. No prior diagnosis. - repeat TSH in 4-6 weeks   Tobacco Use Disorder - Nicotine patch available upon request  FEN/GI: Heart Healthy PPx: Eliquis  Disposition: Cards tele  Prior to Admission Living Arrangement: Home Anticipated Discharge  Location: Home  Barriers to Discharge: Resolution of Acute HF Anticipated discharge in approximately 2-4 day(s).   Subjective:  No significant overnight events. Reports he has been ambulating. No CP, PND.   Objective: Temp:  [98.1 F (36.7 C)-98.8 F (37.1 C)] 98.8 F (37.1 C) (10/10 1146) Pulse Rate:  [72-85] 80 (10/10 1146) Resp:  [14-20] 20 (10/10 1146) BP: (107-120)/(82-90) 112/83 (10/10 1146) SpO2:  [98 %-100 %] 98 % (10/10 1146) Weight:  [83.7 kg] 83.7 kg (10/10 0512) Physical Exam: HEENT: Sclera anicteric. Dentition is moderate. Appears well hydrated. Neck: Supple Cardiac: Regular rate and rhythm. Normal S1/S2. No murmurs, rubs, or gallops appreciated.JVP 10 cm  Lungs: Clear bilaterally to ascultation.  Extremities: Still 1+ edema  Skin: Warm, dry Psych: Pleasant and appropriate   Laboratory: Recent Labs  Lab 03/20/20 0500 03/21/20 0430 03/22/20 0620  WBC 5.9 5.8 5.8  HGB 11.9* 12.3* 13.1  HCT 38.0* 39.4 41.6  PLT 197 194 197   Recent Labs  Lab 03/20/20 0500 03/21/20 0430 03/22/20 0620  NA 135 138 137  K 4.3 4.6 4.3  CL 97* 101 99  CO2 31 30 31   BUN 22* 17 15  CREATININE 1.33* 1.32* 1.30*  CALCIUM 8.5* 8.7* 8.6*  PROT 5.3* 4.8* 5.0*  BILITOT 0.7 0.9 0.9  ALKPHOS 61 58 52  ALT 189* 140* 101*  AST 83* 48* 33  GLUCOSE 122* 113* 108*     Imaging/Diagnostic Tests:  None new.   , MD  Family Medicine Teaching Service

## 2020-03-23 DIAGNOSIS — N289 Disorder of kidney and ureter, unspecified: Secondary | ICD-10-CM

## 2020-03-23 LAB — COMPREHENSIVE METABOLIC PANEL
ALT: 87 U/L — ABNORMAL HIGH (ref 0–44)
AST: 32 U/L (ref 15–41)
Albumin: 3 g/dL — ABNORMAL LOW (ref 3.5–5.0)
Alkaline Phosphatase: 64 U/L (ref 38–126)
Anion gap: 9 (ref 5–15)
BUN: 20 mg/dL (ref 6–20)
CO2: 30 mmol/L (ref 22–32)
Calcium: 8.8 mg/dL — ABNORMAL LOW (ref 8.9–10.3)
Chloride: 98 mmol/L (ref 98–111)
Creatinine, Ser: 1.61 mg/dL — ABNORMAL HIGH (ref 0.61–1.24)
GFR, Estimated: 51 mL/min — ABNORMAL LOW (ref >60)
Glucose, Bld: 107 mg/dL — ABNORMAL HIGH (ref 70–99)
Potassium: 4.4 mmol/L (ref 3.5–5.1)
Sodium: 137 mmol/L (ref 135–145)
Total Bilirubin: 1.3 mg/dL — ABNORMAL HIGH (ref 0.3–1.2)
Total Protein: 5.4 g/dL — ABNORMAL LOW (ref 6.5–8.1)

## 2020-03-23 LAB — COOXEMETRY PANEL
Carboxyhemoglobin: 1.2 % (ref 0.5–1.5)
Carboxyhemoglobin: 1.3 % (ref 0.5–1.5)
Methemoglobin: 0.7 % (ref 0.0–1.5)
Methemoglobin: 0.6 % (ref 0.0–1.5)
O2 Saturation: 51.8 %
O2 Saturation: 50.2 %
Total hemoglobin: 14.6 g/dL (ref 12.0–16.0)
Total hemoglobin: 14.9 g/dL (ref 12.0–16.0)

## 2020-03-23 MED ORDER — SODIUM CHLORIDE 0.9 % IV BOLUS
500.0000 mL | Freq: Once | INTRAVENOUS | Status: AC
  Administered 2020-03-23: 500 mL via INTRAVENOUS

## 2020-03-23 NOTE — Progress Notes (Signed)
   03/23/20 0817  Assess: MEWS Score  Temp 98.5 F (36.9 C)  BP 96/72  Pulse Rate 89  ECG Heart Rate 90  Resp 14  Level of Consciousness Alert  SpO2 99 %  O2 Device Room Air  Patient Activity (if Appropriate) In bed  Assess: MEWS Score  MEWS Temp 0  MEWS Systolic 1  MEWS Pulse 0  MEWS RR 0  MEWS LOC 0  MEWS Score 1  MEWS Score Color Green  Assess: if the MEWS score is Yellow or Red  Were vital signs taken at a resting state? Yes  Focused Assessment No change from prior assessment  Early Detection of Sepsis Score *See Row Information* Low  MEWS guidelines implemented *See Row Information* No, vital signs rechecked  Treat  MEWS Interventions Other (Comment) (N/a, VS re-checked, MEWS green)  Pain Scale 0-10

## 2020-03-23 NOTE — Progress Notes (Addendum)
Advanced Heart Failure Rounding Note  PCP-Cardiologist: Thurmon Fair, MD   Subjective:    10/6 CO-OX 52% started on milrinone 0.25 mcg and diuresed with IV lasix.   RHC/LHC 10/7 showed Normal RA pressure and mildly elevated PCWP today, good cardiac output on milrinone. CI 3. Normal cors.   cMRI 10/8  1. Severe LV dilatation with severe systolic dysfunction (EF 13%) 2.  Moderate RV dilatation with severe systolic dysfunction (EF 20%) 3. 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 Basal septal midwall LGE, which is a scar pattern seen in nonischemic cardiomyopathies and associated with worse prognosis 5. RV insertion site LGE, which is a nonspecific finding often seen in setting of elevated pulmonary pressures 6.  Moderate mitral regurgitation (regurgitant fraction 32%) 7.  Small pericardial effusion  Remains on milrinone 0.125 mcg. CO-OX 52%.   Feels ok. Denies SOB.   Objective:   Weight Range: 83.4 kg Body mass index is 24.94 kg/m.   Vital Signs:   Temp:  [98.4 F (36.9 C)-99.3 F (37.4 C)] 98.5 F (36.9 C) (10/11 0814) Pulse Rate:  [80-98] 89 (10/11 0814) Resp:  [14-20] 14 (10/11 0814) BP: (96-112)/(72-84) 96/72 (10/11 0814) SpO2:  [98 %-100 %] 99 % (10/11 0814) Weight:  [83.4 kg] 83.4 kg (10/11 0521) Last BM Date: 03/22/20  Weight change: Filed Weights   03/21/20 0000 03/22/20 0512 03/23/20 0521  Weight: 86.5 kg 83.7 kg 83.4 kg    Intake/Output:   Intake/Output Summary (Last 24 hours) at 03/23/2020 0925 Last data filed at 03/23/2020 0800 Gross per 24 hour  Intake 1457.98 ml  Output 3420 ml  Net -1962.02 ml      Physical Exam   CVP 1-2  General: No resp difficulty HEENT: normal Neck: supple. no JVD. Carotids 2+ bilat; no bruits. No lymphadenopathy or thryomegaly appreciated. Cor: PMI nondisplaced. Regular rate & rhythm. No rubs, gallops  or murmurs. Lungs: clear Abdomen: soft, nontender, nondistended. No hepatosplenomegaly. No bruits or masses. Good bowel sounds. Extremities: no cyanosis, clubbing, rash, edema. RUE PICC  Neuro: alert & orientedx3, cranial nerves grossly intact. moves all 4 extremities w/o difficulty. Affect pleasant   Telemetry  NSR 60s personally reviewed.    Labs    CBC Recent Labs    03/21/20 0430 03/22/20 0620  WBC 5.8 5.8  HGB 12.3* 13.1  HCT 39.4 41.6  MCV 99.2 96.3  PLT 194 197   Basic Metabolic Panel Recent Labs    52/84/13 1159 03/21/20 0430 03/21/20 0430 03/22/20 0620 03/23/20 0540  NA  --  138   < > 137 137  K  --  4.6   < > 4.3 4.4  CL  --  101   < > 99 98  CO2  --  30   < > 31 30  GLUCOSE  --  113*   < > 108* 107*  BUN  --  17   < > 15 20  CREATININE  --  1.32*   < > 1.30* 1.61*  CALCIUM  --  8.7*   < > 8.6* 8.8*  MG 2.1 1.9  --   --   --    < > = values in this interval not displayed.   Liver Function Tests Recent Labs    03/22/20 0620 03/23/20 0540  AST 33 32  ALT 101* 87*  ALKPHOS 52 64  BILITOT 0.9 1.3*  PROT 5.0* 5.4*  ALBUMIN  2.8* 3.0*   No results for input(s): LIPASE, AMYLASE in the last 72 hours. Cardiac Enzymes No results for input(s): CKTOTAL, CKMB, CKMBINDEX, TROPONINI in the last 72 hours.  BNP: BNP (last 3 results) Recent Labs    03/16/20 0800  BNP 1,394.3*    ProBNP (last 3 results) No results for input(s): PROBNP in the last 8760 hours.   D-Dimer No results for input(s): DDIMER in the last 72 hours. Hemoglobin A1C No results for input(s): HGBA1C in the last 72 hours. Fasting Lipid Panel No results for input(s): CHOL, HDL, LDLCALC, TRIG, CHOLHDL, LDLDIRECT in the last 72 hours. Thyroid Function Tests No results for input(s): TSH, T4TOTAL, T3FREE, THYROIDAB in the last 72 hours.  Invalid input(s): FREET3  Other results:   Imaging    No results found.   Medications:     Scheduled Medications: . apixaban  5 mg Oral  BID  . Chlorhexidine Gluconate Cloth  6 each Topical Daily  . digoxin  0.125 mg Oral Daily  . folic acid  1 mg Oral Daily  . furosemide  80 mg Oral Daily  . multivitamin with minerals  1 tablet Oral Daily  . sacubitril-valsartan  1 tablet Oral BID  . spironolactone  25 mg Oral Daily  . thiamine  100 mg Oral Daily    Infusions: . amiodarone 30 mg/hr (03/22/20 1631)  . milrinone 0.125 mcg/kg/min (03/22/20 1022)    PRN Medications: acetaminophen, sodium chloride flush     Assessment/Plan   1. Acute on chronic systolic CHF: Biventricular failure, TEE this admission with EF<20%, severe RV dysfunction, severe MR. Cause of cardiomyopathy uncertain, possibly due to ETOH (heavy).  Cath today showed no significant coronary disease. Has not been on meds at home. Co-ox initially 52% on 10/6, he was started on milrinone 0.25 and diuresed with excellent response.  Normal RA pressure and mildly elevated PCWP today, good cardiac output on milrinone.  - Milrinone decreased to 0.125 on 10/7.  - Remains on milrinone 0.125 mcg. CO-OX 52%. Repeat CO-OX now.  - CVP 1-2. Hold lasix today.  - Continue digoxin 0.125.  - Continue spironolactone 25 daily  - Continue  Entresto 24/26 bid - Needs complete abstinence from ETOH.  - cMRI c/w ETOH CM  - Not candidate for advanced therapies unless he quits drinking and is compliant with followup.  2. Atrial flutter: s/p DCCV. Suspect recurrence risk is high.  -Remains in NSR.  - Continue Amiodarone IV while on milrinone, back to po when off.   - Continue Eliquis. No bleeding - Consider ablation in the future.  3. AKI on ?CKD stage 3: Possible baseline cardiorenal syndrome. Creatinine up to 2.9  post-DCCV, possibly stunning with transient cardiogenic shock.  Now trending down since starting milrinone.  Creatinine trending up 1.3>1.6. Hold lasix.  4. Elevated LFTs: Suspect shock liver. LFTs continues to trending down.  5. ETOH abuse: Strongly urged  cessation.  6. Smoking: Urged cessation.    Length of Stay: 7  Amy Clegg, NP  03/23/2020, 9:25 AM  Advanced Heart Failure Team Pager [phone] (M-F; 7a - 4p)  Please contact [facility] for night-coverage after hours (4p -7a ) and weekends on [email or web address]   Patient seen and examined with the above-signed Advanced Practice Provider and/or Housestaff. I personally reviewed laboratory data, imaging studies and relevant notes. I independently examined the patient and formulated the important aspects of the plan. I have edited the note to reflect any of my changes or salient points. I have  personally discussed the plan with the patient and/or family.  Remains on milrinone 0.125. Co-ox marginal. CVP low. Creatinine up 1.3 -> 1.6 General:  Sitting up in bed. No resp difficulty HEENT: normal Neck: supple. no JVD. Carotids 2+ bilat; no bruits. No lymphadenopathy or thryomegaly appreciated. Cor: PMI nondisplaced. Regular rate & rhythm. No rubs, gallops or murmurs. Lungs: clear Abdomen: soft, nontender, nondistended. No hepatosplenomegaly. No bruits or masses. Good bowel sounds. Extremities: no cyanosis, clubbing, rash, edema Neuro: alert & orientedx3, cranial nerves grossly intact. moves all 4 extremities w/o difficulty. Affect pleasant  Remains tenuous. Co-ox low. CVP down. Creatinine up Will continue milrinone today. Give back 500cc IVF.   Arvilla Meres, MD  5:05 PM

## 2020-03-23 NOTE — Progress Notes (Signed)
1500 Offered to walk with pt. Stated he does not feel up to it at this time. Encouraged to walk with staff later. Will continue to follow. Luetta Nutting RN BSN 03/23/2020 3:02 PM

## 2020-03-23 NOTE — Progress Notes (Signed)
Family Medicine Teaching Service Daily Progress Note Intern Pager: [phone]  Patient name: Bryan Guerrero Medical record number: [id number] Date of birth: [birth year] Age: 49 y.o. Gender: male  Primary Care Provider: Donney Dice, DO Consultants: Cardiology Code Status: Full  Pt Overview and Major Events to Date:  Admitted on 10/4 Started on milrinone and amiodarone gtt 10/6 Right/Left Cath on 10/7, no CAD  Assessment and Plan:  Mr. Kats a 89 yr oldmalepresenting with dyspnea and intermittent chest pain. PMHx is significant for hypertension, alcoholic cardiomyopathy and congestive heart failure.   Acute on Systolic CHF  Biventricular Heartfailure  Alcoholic-related nonischemic cardiomyopathy TEE demonstratedLV EF ~ <20% and G3DD. PICC placed for CVP. Right/left cath demonstrated no CAD, mildly elevated PCWP. cMRI consistent with EtOH cardiomyopathy. He has good cardiac ouput on milrinone 0.25. Net output is -2,419.4. Weight continues to down trend. Creatinine is 1.61 today. CVP is 1-2 will hold Lasix. -Advanced heart failure team following, appreciate recommendations -Hold Lasix oral 80 mg -Continue Digoxin 0.125 mg daily -Continue Entresto 24-26 mg daily -Continue Milrinone infusion, monitor Co-Ox, per HF  -Continue Amiodaron gtt -Continue Spironolactone 25 mg daily -Continue compression stockings -Continuous cardiac monitoring -Daily weights with strict I/Os -f/u cardiology outpatient -SW to establish care with PCP, encourage adherence to prescribed medications   New Onset Atrial Flutter Rate controlled s/p cardioversion. CHADS Vasc score of 2.  -Appreciate Cardiology recommendations -Hold Coreg 3.125 mg BID  -Continue Eliquis 5 mg BID -Amiodarone gtt   Elevated LFTs Likely secondary to volume overload in setting of HF, patient did not previously have significant elevations. Acute hepatitis panel was negative. LFTs improving. AST 32 and ALT 87. T bili 0.9.   -AM CMP -Consider RUQ [place] if LFT's worsen   HTN Normotensive. Patient was told last admission that he had hypertension and was started on Losartan. No home meds. -Continue Amiodarone infusion -Continue Entresto 24-26 mg daily -Continue Milrinone infusion -Continue Spironolactone 12.5 mg daily -Hold carvedilil 3.125 mg, consider restarting when appropriate -Continue to monitor BP    EtOH Abuse Patient drinks afifthof liquor over a 3-4 day course along with 2-3 cans of beer daily. Has not had any alcohol since10/4. Denies ever being hospitalized for alcohol withdrawal.  -Continue multivitamin daily -Continue Thiamin100 mgdaily -Continue Folic acid1 mgdaily -Continueto encourage abstinence from alcohol  -TOC consult for EtOH resources    AKI: near baseline from 2020.  Creatinine was 1.63 on admission, baseline appears to be around 1.2-1.3. Likely due to poor perfusion/volume overload with HFand previously uncontrolled HTN. Creatinine today is 1.61 most likely due to starting ARB, will continue to monitor. -Continue to trend Cr with AM CMP  -Avoid nephrotoxic agents    Non thyroidal illness syndrome  TSH wnl and elevated T4 1.39. No prior diagnosis. -Repeat TSH in 4-6 weeks    Tobacco Use Disorder -Nicotine patch available upon request   FEN/GI: Heart Healthy PPx: Eliquis  Disposition: Cards Tele  Prior to Admission Living Arrangement: Home Anticipated Discharge Location: Home Barriers to Discharge: Resolution of Acute HF Anticipated discharge in approximately 2-4 day(s).   Subjective:  Interviewed patient at bedside.  He reports doing well this morning. Reports no chest pain or SOB. Has no complaints.  Objective: Temp:  [98.4 F (36.9 C)-99.3 F (37.4 C)] 98.4 F (36.9 C) (10/11 0521) Pulse Rate:  [80-98] 80 (10/11 0521) Resp:  [14-20] 18 (10/11 0521) BP: (100-112)/(75-88) 110/79 (10/11 0521) SpO2:  [98 %-100 %] 100 % (10/10 2110) Weight:   [83.4 kg]  83.4 kg (10/11 0521) Physical Exam:  Physical Exam Vitals and nursing note reviewed.  Constitutional:      General: He is not in acute distress.    Appearance: Normal appearance. He is normal weight. He is not ill-appearing, toxic-appearing or diaphoretic.  HENT:     Head: Normocephalic and atraumatic.  Cardiovascular:     Rate and Rhythm: Normal rate and regular rhythm.     Pulses: Normal pulses.          Radial pulses are 2+ on the right side and 2+ on the left side.       Dorsalis pedis pulses are 2+ on the right side and 2+ on the left side.     Heart sounds: Normal heart sounds, S1 normal and S2 normal. No murmur heard.   Pulmonary:     Effort: Pulmonary effort is normal. No respiratory distress.     Breath sounds: Normal breath sounds. No wheezing.  Abdominal:     General: There is no distension.     Palpations: There is no mass.     Tenderness: There is no abdominal tenderness.  Musculoskeletal:     Right lower leg: No edema.     Left lower leg: No edema.  Neurological:     Mental Status: He is alert. Mental status is at baseline.  Psychiatric:        Mood and Affect: Mood normal.        Behavior: Behavior normal.        Thought Content: Thought content normal.      Laboratory: Recent Labs  Lab 03/20/20 0500 03/21/20 0430 03/22/20 0620  WBC 5.9 5.8 5.8  HGB 11.9* 12.3* 13.1  HCT 38.0* 39.4 41.6  PLT 197 194 197   Recent Labs  Lab 03/20/20 0500 03/21/20 0430 03/22/20 0620  NA 135 138 137  K 4.3 4.6 4.3  CL 97* 101 99  CO2 _0 BUN 22* 17 15  CREATININE 1.33* 1.32* 1.30*  CALCIUM 8.5* 8.7* 8.6*  PROT 5.3* 4.8* 5.0*  BILITOT 0.7 0.9 0.9  ALKPHOS 61 58 52  ALT 189* 140* 101*  AST 83* 48* 33  GLUCOSE 122* 113* 108*   Results for orders placed or performed during the hospital encounter of 03/16/20 (from the past 24 hour(s))  Comprehensive metabolic panel     Status: Abnormal   Collection Time: 03/23/20  5:40 AM  Result Value Ref  Range   Sodium 137 135 - 145 mmol/L   Potassium 4.4 3.5 - 5.1 mmol/L   Chloride 98 98 - 111 mmol/L   CO2 30 22 - 32 mmol/L   Glucose, Bld 107 (H) 70 - 99 mg/dL   BUN 20 6 - 20 mg/dL   Creatinine, Ser 1.61 (H) 0.61 - 1.24 mg/dL   Calcium 8.8 (L) 8.9 - 10.3 mg/dL   Total Protein 5.4 (L) 6.5 - 8.1 g/dL   Albumin 3.0 (L) 3.5 - 5.0 g/dL   AST 32 15 - 41 U/L   ALT 87 (H) 0 - 44 U/L   Alkaline Phosphatase 64 38 - 126 U/L   Total Bilirubin 1.3 (H) 0.3 - 1.2 mg/dL   GFR, Estimated 51 (L) >60 mL/min   Anion gap 9 5 - 15  Cooxemetry Panel (carboxy, met, total hgb, O2 sat)     Status: None   Collection Time: 03/23/20  5:40 AM  Result Value Ref Range   Total hemoglobin 14.6 12.0 - 16.0 g/dL  O2 Saturation 51.8 %   Carboxyhemoglobin 1.2 0.5 - 1.5 %   Methemoglobin 0.7 0.0 - 1.5 %     Imaging/Diagnostic Tests:   MR Cardiac Morphology W WO Contrast: Severe LV dilatation with severe systolic dysfunction (EF 30%) Moderate RV dilatation with severe systolic dysfunction (EF 07%) 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 RV insertion site LGE, which is a nonspecific finding often seen in setting of elevated pulmonary pressures.Moderate mitral regurgitation (regurgitant fraction 32%)Small pericardial effusion.   CT Abdomen Pelvis Wo Contrast: Small amount of free fluid is noted in the dependent portion of the pelvis of unknown etiology. The finding which was described as free air on prior CT exam of same day actually appears to represent a portion of colon that is interposed between the liver and diaphragm. There is no evidence of pneumoperitoneum. No significant abnormality is noted in the abdomen or pelvis.   CT Angio Chest PE W/cm &/Or WO Cm: There is pneumoperitoneum  with upper abdominal ascites. Etiology for this pneumoperitoneum uncertain. Question recent surgery as a potential etiology. If patient has not had recent surgical procedure involving the abdomen or pelvis, perforated viscus must be of concern based on this appearance. No evident pulmonary embolus. No thoracic aortic aneurysm. No dissection evident; note that the contrast bolus in the aorta is not sufficient for dissection assessment. Scattered areas of ill-defined airspace opacity in the upper lobes raises question for underlying atypical organism pneumonia. Check of COVID-19 status advised in this regard. No consolidation or pleural effusions. Lower lobe bronchiectatic change bilaterally. Scattered areas of lower lobe scarring. No evident adenopathy. Cardiomegaly.   DG Chest 2 View: Similar enlargement of the cardiopericardial silhouette. No acute findings.   Briant Cedar, MD 03/23/2020, 6:17 AM PGY-1, [place] Intern pager: [phone], text pages welcome

## 2020-03-24 ENCOUNTER — Inpatient Hospital Stay: Payer: Self-pay | Admitting: Family Medicine

## 2020-03-24 LAB — COOXEMETRY PANEL
Carboxyhemoglobin: 1.2 % (ref 0.5–1.5)
Methemoglobin: 0.8 % (ref 0.0–1.5)
O2 Saturation: 59.3 %
Total hemoglobin: 14.2 g/dL (ref 12.0–16.0)

## 2020-03-24 LAB — CBC
HCT: 43.9 % (ref 39.0–52.0)
Hemoglobin: 13.8 g/dL (ref 13.0–17.0)
MCH: 30.5 pg (ref 26.0–34.0)
MCHC: 31.4 g/dL (ref 30.0–36.0)
MCV: 96.9 fL (ref 80.0–100.0)
Platelets: 205 10*3/uL (ref 150–400)
RBC: 4.53 MIL/uL (ref 4.22–5.81)
RDW: 11.9 % (ref 11.5–15.5)
WBC: 5.5 10*3/uL (ref 4.0–10.5)
nRBC: 0 % (ref 0.0–0.2)

## 2020-03-24 LAB — COMPREHENSIVE METABOLIC PANEL
ALT: 63 U/L — ABNORMAL HIGH (ref 0–44)
AST: 34 U/L (ref 15–41)
Albumin: 2.9 g/dL — ABNORMAL LOW (ref 3.5–5.0)
Alkaline Phosphatase: 63 U/L (ref 38–126)
Anion gap: 7 (ref 5–15)
BUN: 20 mg/dL (ref 6–20)
CO2: 29 mmol/L (ref 22–32)
Calcium: 8.7 mg/dL — ABNORMAL LOW (ref 8.9–10.3)
Chloride: 101 mmol/L (ref 98–111)
Creatinine, Ser: 1.46 mg/dL — ABNORMAL HIGH (ref 0.61–1.24)
GFR, Estimated: 57 mL/min — ABNORMAL LOW (ref >60)
Glucose, Bld: 113 mg/dL — ABNORMAL HIGH (ref 70–99)
Potassium: 4.5 mmol/L (ref 3.5–5.1)
Sodium: 137 mmol/L (ref 135–145)
Total Bilirubin: 0.9 mg/dL (ref 0.3–1.2)
Total Protein: 5.2 g/dL — ABNORMAL LOW (ref 6.5–8.1)

## 2020-03-24 LAB — DIGOXIN LEVEL: Digoxin Level: 0.7 ng/mL — ABNORMAL LOW (ref 1.0–2.0)

## 2020-03-24 MED ORDER — POLYETHYLENE GLYCOL 3350 17 G PO PACK
17.0000 g | PACK | Freq: Two times a day (BID) | ORAL | Status: DC
  Administered 2020-03-24 – 2020-03-26 (×5): 17 g via ORAL
  Filled 2020-03-24 (×5): qty 1

## 2020-03-24 MED ORDER — SENNA 8.6 MG PO TABS
1.0000 | ORAL_TABLET | Freq: Every day | ORAL | Status: DC
  Administered 2020-03-24 – 2020-03-26 (×3): 8.6 mg via ORAL
  Filled 2020-03-24 (×3): qty 1

## 2020-03-24 MED ORDER — AMIODARONE HCL 200 MG PO TABS
200.0000 mg | ORAL_TABLET | Freq: Two times a day (BID) | ORAL | Status: DC
  Administered 2020-03-24 – 2020-03-26 (×5): 200 mg via ORAL
  Filled 2020-03-24 (×5): qty 1

## 2020-03-24 MED ORDER — POLYETHYLENE GLYCOL 3350 17 G PO PACK: 17.0000 g | PACK | Freq: Two times a day (BID) | ORAL | Status: DC | PRN

## 2020-03-24 NOTE — Progress Notes (Addendum)
FMTS Attending Daily Note: Bryan Starr, MD  Team Pager [phone] Pager [phone]  I have seen and examined this patient, reviewed their chart. I have discussed this patient with the resident. I agree with the resident's findings, assessment and care plan.  My edits are within note.   Status is: Inpatient  Remains inpatient appropriate because:Inpatient level of care appropriate due to severity of illness   Dispo:  Patient From: Home  Planned Disposition: Home  Expected discharge date: 03/26/20  Medically stable for discharge: No         Family Medicine Teaching Service Daily Progress Note Intern Pager: [phone]  Patient name: Bryan Guerrero Medical record number: [id number] Date of birth: January 02, 1971 Age: 49 y.o. Gender: male  Primary Care Provider: Reece Leader, DO Consultants: Cardiology Code Status: Full  Pt Overview and Major Events to Date:  Admitted on 10/4 Started on milrinone and amiodarone gtt 10/6 Right/Left Cath on 10/7, no CAD  Assessment and Plan:  [name] a 48 yr oldmalepresenting with acute on chronic exacerbation of heart failure with reduced EF. PMHx is significant for hypertension, alcoholic cardiomyopathy and congestive heart failure.   Acute on Systolic CHF  Biventricular Heartfailure  Alcoholic-related nonischemic cardiomyopathy TEE demonstratedLV EF ~ <20% and G3DD. PICC placed for CVP milrinone (ended 10/12). Right/left cath demonstrated no CAD, mildly elevated PCWP. cMRI consistent with EtOH cardiomyopathy. He has good cardiac ouput on milrinone 0.25.Net output is -1534.9. Weight continues to down trend. Received 500 cc NS bolus yesterday. Creatinine today is improved to 1.46.BP soft today will continue to hold Lasix. -Advanced heart failure team following, appreciate recommendations -Hold Lasix oral 80 mg -Continue Digoxin 0.125 mg daily -Continue Entresto 24-26 mg daily -Stop Milrinone infusion,  -Continue to monitor Co-Ox,  per HF -Stop Amiodarone gtt -Start oral Amiodarone 200 mg BID -Continue Spironolactone 25 mg daily -Continue compression stockings -Continuous cardiac monitoring -Daily weights with strict I/Os -f/u cardiology outpatient -SW to establish care with PCP, encourage adherence to prescribed medications   New Onset Atrial Flutter Rate controlled s/p cardioversion.CHADS Vasc score of 2. Per cardiology possible ablation in the future. -Appreciate Cardiology recommendations -Hold Coreg 3.125 mg BID  -Continue Eliquis 5 mg BID -Amiodarone PO as above    Elevated LFTs Likely secondary to volume overload in setting of HF, patient did not previously have significant elevations.Acute hepatitis panel was negative. LFTs improving. AST 34 and ALT 63. T bili is 0.9. -AM CMP -ConsiderRUQUS if LFT's worsen   HTN Normotensive.Patient was told last admission that he had hypertension and was started on Losartan. Nohome meds. -Continue Entresto 24-26 mg daily -Continue Spironolactone 12.5 mg daily -Hold carvedilil 3.125 mg, consider restarting when appropriate -Continue to monitor BP    EtOH Abuse Patient drinks afifthof liquor over a 3-4 day course along with 2-3 cans of beer daily. Has not had any alcohol since10/4. Denies ever being hospitalized for alcohol withdrawal.  -Continue multivitamin daily -Continue Thiamin100 mgdaily -Continue Folic acid1 mgdaily -Continueto encourage abstinence from alcohol  -TOC consult for EtOH resources - Declined naltrexone, discussed    LEX:NTZG baseline from 2020. Creatinine was 1.63 on admission, baseline appears to be around 1.2-1.3. Likely due to poor perfusion/volume overload with HFand previously uncontrolled HTN. Creatinine today is1.46 most likely due to starting ARB, will continue to monitor. -Continue to trendCr with AM CMP  -Avoid nephrotoxic agents   Non thyroidal illness syndrome TSH wnl and elevated  T4 1.39. No prior diagnosis. -Repeat TSH in 4-6 weeks  Tobacco Use Disorder -Nicotine patch available upon request   FEN/GI: Heart Healthy/ Miralax  PPx: Eliquis  Disposition: Cards Tele  Prior to Admission Living Arrangement: Home Anticipated Discharge Location: Home Barriers to Discharge: Continued Medical Management Anticipated discharge in approximately 1-4 day(s).   Subjective:  Interviewed patient at bedside.  He reports doing well, has no complaints at this time. Discussed starting Naltrexone to curb alcohol cravings but he reports that he is not interested at this time in starting it. Discussed that if he changes his mind this can be started.   Objective: Temp:  [97.9 F (36.6 C)-98.7 F (37.1 C)] 98.6 F (37 C) (10/12 0434) Pulse Rate:  [84-89] 84 (10/12 0434) Resp:  [14-16] 15 (10/12 0434) BP: (93-98)/(68-75) 98/68 (10/12 0434) SpO2:  [97 %-100 %] 100 % (10/12 0431) Weight:  [84.4 kg] 84.4 kg (10/12 0431) Physical Exam:  Physical Exam Vitals and nursing note reviewed.  Constitutional:      Appearance: He is well-developed and normal weight.  HENT:     Head: Normocephalic and atraumatic.  Cardiovascular:     Rate and Rhythm: Normal rate and regular rhythm.     Pulses: Normal pulses.          Radial pulses are 2+ on the right side and 2+ on the left side.       Dorsalis pedis pulses are 2+ on the right side and 2+ on the left side.     Heart sounds: Normal heart sounds, S1 normal and S2 normal. No murmur heard.   Pulmonary:     Effort: Pulmonary effort is normal. No respiratory distress.     Breath sounds: Normal breath sounds. No wheezing.  Abdominal:     General: There is no distension.     Palpations: There is no mass.     Tenderness: There is no abdominal tenderness.  Musculoskeletal:     Right lower leg: No edema.     Left lower leg: No edema.  Neurological:     Mental Status: He is alert. Mental status is at baseline.  Psychiatric:         Mood and Affect: Mood normal.        Behavior: Behavior normal.        Thought Content: Thought content normal.      Laboratory: Recent Labs  Lab 03/20/20 0500 03/21/20 0430 03/22/20 0620  WBC 5.9 5.8 5.8  HGB 11.9* 12.3* 13.1  HCT 38.0* 39.4 41.6  PLT 197 194 197   Recent Labs  Lab 03/21/20 0430 03/22/20 0620 03/23/20 0540  NA 138 137 137  K 4.6 4.3 4.4  CL 101 99 98  CO2 30 31 30   BUN 17 15 20   CREATININE 1.32* 1.30* 1.61*  CALCIUM 8.7* 8.6* 8.8*  PROT 4.8* 5.0* 5.4*  BILITOT 0.9 0.9 1.3*  ALKPHOS 58 52 64  ALT 140* 101* 87*  AST 48* 33 32  GLUCOSE 113* 108* 107*      Imaging/Diagnostic Tests:   MR Cardiac Morphology W WO Contrast:Severe LV dilatation with severe systolic dysfunction (EF 13%) Moderate RV dilatation with severe systolic dysfunction (EF 20%) 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   CT  Abdomen Pelvis Wo Contrast:Small amount of free fluid is noted in the dependent portion of the pelvis of unknown etiology. The finding which was described as free air on prior CT exam of same day actually appears to represent a portion of colon that is interposed between the liver and diaphragm. There is no evidence of pneumoperitoneum. No significant abnormality is noted in the abdomen or pelvis.   CT Angio Chest PE W/cm &/Or WO TK:WIOXB is pneumoperitoneum with upper abdominal ascites. Etiology for this pneumoperitoneum uncertain. Question recent surgery as a potential etiology. If patient has not had recent surgical procedure involving the  abdomen or pelvis, perforated viscus must be of concern based on this appearance. No evident pulmonary embolus. No thoracic aortic aneurysm. No dissection evident; note that the contrast bolus in the aorta is not sufficient for dissection assessment. Scattered areas of ill-defined airspace opacity in the upper lobes raises question for underlying atypical organism pneumonia. Check of COVID-19 status advised in this regard. No consolidation or pleural effusions. Lower lobe bronchiectatic change bilaterally. Scattered areas of lower lobe scarring. No evident adenopathy. Cardiomegaly.   DG Chest 2 View:Similar enlargement of the cardiopericardial silhouette. No acute findings.   Lauro Franklin, MD 03/24/2020, 5:55 AM PGY-1, [facility] Family Medicine FPTS Intern pager: [phone], text pages welcome

## 2020-03-24 NOTE — Progress Notes (Signed)
Pt declined ambulating in hall. Sts he has been up in room. Encouraged him to walk in room or hall more tonight to see how he feels without milrinone. Pt agreed. No questions. [id number] Ethelda Chick CES, ACSM 3:05 PM 03/24/2020

## 2020-03-24 NOTE — Progress Notes (Addendum)
Advanced Heart Failure Rounding Note  PCP-Cardiologist: Thurmon Fair, MD   Subjective:    10/6 CO-OX 52% started on milrinone 0.25 mcg and diuresed with IV lasix.   RHC/LHC 10/7 showed Normal RA pressure and mildly elevated PCWP today, good cardiac output on milrinone. CI 3. Normal cors.   cMRI 10/8  1. Severe LV dilatation with severe systolic dysfunction (EF 13%) 2.  Moderate RV dilatation with severe systolic dysfunction (EF 20%) 3. 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 Basal septal midwall LGE, which is a scar pattern seen in nonischemic cardiomyopathies and associated with worse prognosis 5. RV insertion site LGE, which is a nonspecific finding often seen in setting of elevated pulmonary pressures 6.  Moderate mitral regurgitation (regurgitant fraction 32%) 7.  Small pericardial effusion  Remains on milrinone 0.125 mcg. CO-OX 59%. CVP low yesterday. Lasix stopped and he got 500cc NS back.   Feels ok. Denies SOB, orthopnea or PND. Remains in NSR. CVP 3  Objective:   Weight Range: 84.4 kg Body mass index is 25.24 kg/m.   Vital Signs:   Temp:  [97.9 F (36.6 C)-98.7 F (37.1 C)] 98.6 F (37 C) (10/12 0434) Pulse Rate:  [84-89] 84 (10/12 0434) Resp:  [14-16] 15 (10/12 0434) BP: (93-98)/(68-75) 98/68 (10/12 0434) SpO2:  [97 %-100 %] 100 % (10/12 0431) Weight:  [84.4 kg] 84.4 kg (10/12 0431) Last BM Date: 03/21/20  Weight change: Filed Weights   03/22/20 0512 03/23/20 0521 03/24/20 0431  Weight: 83.7 kg 83.4 kg 84.4 kg    Intake/Output:   Intake/Output Summary (Last 24 hours) at 03/24/2020 0655 Last data filed at 03/24/2020 0430 Gross per 24 hour  Intake 1710.12 ml  Output 3245 ml  Net -1534.88 ml      Physical Exam   General:  Lying in bed. No resp difficulty HEENT: normal Neck: supple. no JVD. Carotids 2+ bilat; no bruits. No  lymphadenopathy or thryomegaly appreciated. Cor: PMI nondisplaced. Regular rate & rhythm. No rubs, gallops or murmurs. Lungs: clear Abdomen: soft, nontender, nondistended. No hepatosplenomegaly. No bruits or masses. Good bowel sounds. Extremities: no cyanosis, clubbing, rash, edema Neuro: alert & orientedx3, cranial nerves grossly intact. moves all 4 extremities w/o difficulty. Affect pleasant   Telemetry   NSR 60s personally reviewed.   Labs    CBC Recent Labs    03/22/20 0620 03/24/20 0545  WBC 5.8 5.5  HGB 13.1 13.8  HCT 41.6 43.9  MCV 96.3 96.9  PLT 197 205   Basic Metabolic Panel Recent Labs    16/10/96 0540 03/24/20 0545  NA 137 137  K 4.4 4.5  CL 98 101  CO2 30 29  GLUCOSE 107* 113*  BUN 20 20  CREATININE 1.61* 1.46*  CALCIUM 8.8* 8.7*   Liver Function Tests Recent Labs    03/23/20 0540 03/24/20 0545  AST 32 34  ALT 87* 63*  ALKPHOS 64 63  BILITOT 1.3* 0.9  PROT 5.4* 5.2*  ALBUMIN 3.0* 2.9*   No results for input(s): LIPASE, AMYLASE in the last 72 hours. Cardiac Enzymes No results for input(s): CKTOTAL, CKMB, CKMBINDEX, TROPONINI in the last 72 hours.  BNP: BNP (last 3 results) Recent Labs    03/16/20 0800  BNP 1,394.3*    ProBNP (last 3 results) No results for input(s): PROBNP in the last 8760 hours.   D-Dimer No results for input(s): DDIMER in the last 72 hours. Hemoglobin A1C  No results for input(s): HGBA1C in the last 72 hours. Fasting Lipid Panel No results for input(s): CHOL, HDL, LDLCALC, TRIG, CHOLHDL, LDLDIRECT in the last 72 hours. Thyroid Function Tests No results for input(s): TSH, T4TOTAL, T3FREE, THYROIDAB in the last 72 hours.  Invalid input(s): FREET3  Other results:   Imaging    No results found.   Medications:     Scheduled Medications: . amiodarone  200 mg Oral BID  . apixaban  5 mg Oral BID  . Chlorhexidine Gluconate Cloth  6 each Topical Daily  . digoxin  0.125 mg Oral Daily  . folic acid  1 mg  Oral Daily  . multivitamin with minerals  1 tablet Oral Daily  . sacubitril-valsartan  1 tablet Oral BID  . spironolactone  25 mg Oral Daily  . thiamine  100 mg Oral Daily    Infusions:   PRN Medications: acetaminophen, sodium chloride flush     Assessment/Plan   1. Acute on chronic systolic CHF: Biventricular failure, TEE this admission with EF<20%, severe RV dysfunction, severe MR. Cause of cardiomyopathy uncertain, possibly due to ETOH (heavy).  Cath today showed no significant coronary disease. Has not been on meds at home. Co-ox initially 52% on 10/6, he was started on milrinone 0.25 and diuresed with excellent response.  Normal RA pressure and mildly elevated PCWP today, good cardiac output on milrinone.  - Remains on milrinone 0.125 mcg. CO-OX 59%. - Stop milrinone - CVP 3. Continue to hold lasix today.  - Continue digoxin 0.125.  - Continue spironolactone 25 daily  - Continue  Entresto 24/26 bid - Needs complete abstinence from ETOH.  - cMRI c/w ETOH CM  - Not candidate for advanced therapies unless he quits drinking and is compliant with followup.  - Follow CVP and co-ox off milrinone. Possibly home in 24-48 hours if co-ox stable.  2. Atrial flutter: s/p DCCV. Suspect recurrence risk is high.  -Remains in NSR.  - Stopping milrinone so switch amio to 200 bid - Continue Eliquis. No bleeding - Consider ablation in the future.  3. AKI on ?CKD stage 3: Possible baseline cardiorenal syndrome. Creatinine up to 2.9  post-DCCV, possibly stunning with transient cardiogenic shock.  Now trending down since starting milrinone.  Creatinine trending up 1.3->1.6 -> 1.4. CVP low. Hold lasix.  4. Elevated LFTs: Suspect shock liver. LFTs continues to trending down.  5. ETOH abuse: Strongly urged cessation.  6. Smoking: Urged cessation.    Length of Stay: 8  Arvilla Meres, MD  03/24/2020, 6:55 AM  Advanced Heart Failure Team Pager [phone] (M-F; 7a - 4p)  Please contact  [facility] for night-coverage after hours (4p -7a ) and weekends on [email or web address]

## 2020-03-25 LAB — COMPREHENSIVE METABOLIC PANEL
ALT: 55 U/L — ABNORMAL HIGH (ref 0–44)
AST: 30 U/L (ref 15–41)
Albumin: 3 g/dL — ABNORMAL LOW (ref 3.5–5.0)
Alkaline Phosphatase: 60 U/L (ref 38–126)
Anion gap: 6 (ref 5–15)
BUN: 19 mg/dL (ref 6–20)
CO2: 28 mmol/L (ref 22–32)
Calcium: 8.8 mg/dL — ABNORMAL LOW (ref 8.9–10.3)
Chloride: 103 mmol/L (ref 98–111)
Creatinine, Ser: 1.3 mg/dL — ABNORMAL HIGH (ref 0.61–1.24)
GFR, Estimated: >60 mL/min (ref >60)
Glucose, Bld: 110 mg/dL — ABNORMAL HIGH (ref 70–99)
Potassium: 4.7 mmol/L (ref 3.5–5.1)
Sodium: 137 mmol/L (ref 135–145)
Total Bilirubin: 1.1 mg/dL (ref 0.3–1.2)
Total Protein: 5.6 g/dL — ABNORMAL LOW (ref 6.5–8.1)

## 2020-03-25 LAB — CBC
HCT: 45.1 % (ref 39.0–52.0)
Hemoglobin: 14.3 g/dL (ref 13.0–17.0)
MCH: 31 pg (ref 26.0–34.0)
MCHC: 31.7 g/dL (ref 30.0–36.0)
MCV: 97.6 fL (ref 80.0–100.0)
Platelets: 215 10*3/uL (ref 150–400)
RBC: 4.62 MIL/uL (ref 4.22–5.81)
RDW: 12 % (ref 11.5–15.5)
WBC: 5.6 10*3/uL (ref 4.0–10.5)
nRBC: 0 % (ref 0.0–0.2)

## 2020-03-25 LAB — COOXEMETRY PANEL
Carboxyhemoglobin: 1.1 % (ref 0.5–1.5)
Methemoglobin: 0.6 % (ref 0.0–1.5)
O2 Saturation: 52 %
Total hemoglobin: 14.4 g/dL (ref 12.0–16.0)

## 2020-03-25 LAB — DIGOXIN LEVEL: Digoxin Level: 0.4 ng/mL — ABNORMAL LOW (ref 1.0–2.0)

## 2020-03-25 MED ORDER — DAPAGLIFLOZIN PROPANEDIOL 10 MG PO TABS
10.0000 mg | ORAL_TABLET | Freq: Every day | ORAL | Status: DC
  Administered 2020-03-25 – 2020-03-26 (×2): 10 mg via ORAL
  Filled 2020-03-25 (×2): qty 1

## 2020-03-25 NOTE — Progress Notes (Signed)
I agree with charting from previous nurse this shift  

## 2020-03-25 NOTE — Progress Notes (Signed)
Family Medicine Teaching Service Daily Progress Note Intern Pager: [phone]  Patient name: Bryan Guerrero Medical record number: [id number] Date of birth: 30-Jul-1970 Age: 49 y.o. Gender: male  Primary Care Provider: Reece Leader, DO Consultants: Cardiology   Code Status: Full Code  Pt Overview and Major Events to Date:  Admitted on 10/4 Started on milrinone and amiodarone gtt 10/6 Right/Left Cath on 10/7, no CAD  Assessment and Plan: PIO EATHERLY is a 49 y.o. male presenting with acute on chronic exacerbation of heart failure with reduced EF. PMHx is significant for hypertension, alcoholic cardiomyopathy and congestive heart failure.  Acute HFrEF exacerbation TEE demonstratedLV EF ~ <20% and G3DD. PICC placed for CVP milrinone (ended 10/12). Right/left cath demonstrated no CAD, mildly elevated PCWP. cMRI consistent with EtOH cardiomyopathy. This morning, vitals wnl. No edema on exam. Still with low CVP, co-ox down to 52%. Net -560 mL past 24 hours. Weight stable. - advanced heart failure team following, appreciate involvement - hold Lasix today - continue digoxin - continue Entresto - continue amiodarone - daily weights, strict I/O  Atrial flutter New onset. Rate controlled s/p DCCV. CHADS Vasc 2. Possible ablation in the future per cardiology. - holding carvedilol - continue apixaban - continue amiodarone  Transaminitis Improving. - trend on CMP  HTN Normotensive. - Continue Entresto - continue spironolactone - holding carvedilol  Alcohol use disorder Patient drinks afifthof liquor over a 3-4 day course along with 2-3 cans of beer daily. Has not had any alcohol since10/4. Denies ever being hospitalized for alcohol withdrawal. Declined naltrexone. - continue multivitamin, thiamine, folic acid - encourage alcohol cessation - TOC c/s  AKI Resolved. Baseline around 1.2-1.3, Cr 1.3 this morning.  Non thyroidal illness syndrome Normal TSH with elevated T4. No  history of thyroid disorders. - repeat TSH in 4-6 weeks  FEN/GI: heart healthy PPx: apixaban  Disposition: cardiac-tele  Subjective:  Reports feeling well this morning, no concerns at this time.  States he has been feeling okay off the milrinone.  Objective: Temp:  [97.7 F (36.5 C)-99.1 F (37.3 C)] 98.4 F (36.9 C) (10/13 0745) Pulse Rate:  [69-82] 76 (10/13 0745) Resp:  [15-17] 17 (10/13 0745) BP: (101-114)/(71-82) 114/82 (10/13 0745) SpO2:  [96 %-100 %] 100 % (10/13 0416) Weight:  [84.3 kg] 84.3 kg (10/13 0416) Physical Exam: General: Alert, resting in bed comfortably, NAD Cardiovascular: RRR, no murmurs Respiratory: CTAB Abdomen: soft, non-tender, +BS Extremities: WWP, no edema  Laboratory: Recent Labs  Lab 03/22/20 0620 03/24/20 0545 03/25/20 0422  WBC 5.8 5.5 5.6  HGB 13.1 13.8 14.3  HCT 41.6 43.9 45.1  PLT 197 205 215   Recent Labs  Lab 03/23/20 0540 03/24/20 0545 03/25/20 0422  NA 137 137 137  K 4.4 4.5 4.7  CL 98 101 103  CO2 30 29 28   BUN 20 20 19   CREATININE 1.61* 1.46* 1.30*  CALCIUM 8.8* 8.7* 8.8*  PROT 5.4* 5.2* 5.6*  BILITOT 1.3* 0.9 1.1  ALKPHOS 64 63 60  ALT 87* 63* 55*  AST 32 34 30  GLUCOSE 107* 113* 110*    Imaging/Diagnostic Tests: No new imaging.  , MD 03/25/2020, 9:54 AM PGY-1, [place] [facility] FPTS Intern pager: [phone], text pages welcome

## 2020-03-25 NOTE — Progress Notes (Addendum)
Advanced Heart Failure Rounding Note  PCP-Cardiologist: Thurmon Fair, MD   Subjective:    10/6 CO-OX 52% started on milrinone 0.25 mcg and diuresed with IV lasix.   RHC/LHC 10/7 showed Normal RA pressure and mildly elevated PCWP, good cardiac output on milrinone. CI 3. Normal cors.   cMRI 10/8  1. Severe LV dilatation with severe systolic dysfunction (EF 13%) 2.  Moderate RV dilatation with severe systolic dysfunction (EF 20%) 3. 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 Basal septal midwall LGE, which is a scar pattern seen in nonischemic cardiomyopathies and associated with worse prognosis 5. RV insertion site LGE, which is a nonspecific finding often seen in setting of elevated pulmonary pressures 6.  Moderate mitral regurgitation (regurgitant fraction 32%) 7.  Small pericardial effusion  Milrinone discontinued yesterday. Co-ox 59>> 52%. CVP 3-4  No complaints. Denies dyspnea and fatigue.   Objective:   Weight Range: 84.3 kg Body mass index is 25.2 kg/m.   Vital Signs:   Temp:  [97.7 F (36.5 C)-99.1 F (37.3 C)] 99.1 F (37.3 C) (10/13 0416) Pulse Rate:  [69-82] 82 (10/12 1931) Resp:  [15-18] 15 (10/13 0416) BP: (100-110)/(70-81) 103/81 (10/13 0416) SpO2:  [96 %-100 %] 100 % (10/13 0416) Weight:  [84.3 kg] 84.3 kg (10/13 0416) Last BM Date: 03/24/20  Weight change: Filed Weights   03/23/20 0521 03/24/20 0431 03/25/20 0416  Weight: 83.4 kg 84.4 kg 84.3 kg    Intake/Output:   Intake/Output Summary (Last 24 hours) at 03/25/2020 0745 Last data filed at 03/25/2020 0415 Gross per 24 hour  Intake 840 ml  Output 1400 ml  Net -560 ml      Physical Exam   CVP 3-4 General:  Slightly fatigue appearing. No resp difficulty HEENT: normal Neck: supple. no JVD. Carotids 2+ bilat; no bruits. No lymphadenopathy or thryomegaly appreciated. Cor: PMI  nondisplaced. Regular rate & rhythm. No rubs, gallops or murmurs. Lungs: clear, no wheezing  Abdomen: soft, nontender, nondistended. No hepatosplenomegaly. No bruits or masses. Good bowel sounds. Extremities: no cyanosis, clubbing, rash, edema Neuro: alert & orientedx3, cranial nerves grossly intact. moves all 4 extremities w/o difficulty. Affect pleasant   Telemetry   NSR 80s personally reviewed.   Labs    CBC Recent Labs    03/24/20 0545 03/25/20 0422  WBC 5.5 5.6  HGB 13.8 14.3  HCT 43.9 45.1  MCV 96.9 97.6  PLT 205 215   Basic Metabolic Panel Recent Labs    56/21/30 0545 03/25/20 0422  NA 137 137  K 4.5 4.7  CL 101 103  CO2 29 28  GLUCOSE 113* 110*  BUN 20 19  CREATININE 1.46* 1.30*  CALCIUM 8.7* 8.8*   Liver Function Tests Recent Labs    03/24/20 0545 03/25/20 0422  AST 34 30  ALT 63* 55*  ALKPHOS 63 60  BILITOT 0.9 1.1  PROT 5.2* 5.6*  ALBUMIN 2.9* 3.0*   No results for input(s): LIPASE, AMYLASE in the last 72 hours. Cardiac Enzymes No results for input(s): CKTOTAL, CKMB, CKMBINDEX, TROPONINI in the last 72 hours.  BNP: BNP (last 3 results) Recent Labs    03/16/20 0800  BNP 1,394.3*    ProBNP (last 3 results) No results for input(s): PROBNP in the last 8760 hours.   D-Dimer No results for input(s): DDIMER in the last 72 hours. Hemoglobin A1C No results for input(s): HGBA1C in the last 72 hours. Fasting Lipid  Panel No results for input(s): CHOL, HDL, LDLCALC, TRIG, CHOLHDL, LDLDIRECT in the last 72 hours. Thyroid Function Tests No results for input(s): TSH, T4TOTAL, T3FREE, THYROIDAB in the last 72 hours.  Invalid input(s): FREET3  Other results:   Imaging    No results found.   Medications:     Scheduled Medications: . amiodarone  200 mg Oral BID  . apixaban  5 mg Oral BID  . Chlorhexidine Gluconate Cloth  6 each Topical Daily  . digoxin  0.125 mg Oral Daily  . folic acid  1 mg Oral Daily  . multivitamin with  minerals  1 tablet Oral Daily  . polyethylene glycol  17 g Oral BID  . sacubitril-valsartan  1 tablet Oral BID  . senna  1 tablet Oral Daily  . spironolactone  25 mg Oral Daily  . thiamine  100 mg Oral Daily    Infusions:   PRN Medications: acetaminophen, sodium chloride flush     Assessment/Plan   1. Acute on chronic systolic CHF: Biventricular failure, TEE this admission with EF<20%, severe RV dysfunction, severe MR. Cause of cardiomyopathy uncertain, possibly due to ETOH (heavy).  Cath today showed no significant coronary disease. Has not been on meds at home. Co-ox initially 52% on 10/6, he was started on milrinone 0.25 and diuresed with excellent response.  Normal RA pressure and mildly elevated PCWP, good cardiac output on milrinone.  - Milrinone discontinued 10/12.  CO-OX marginal, 52%. - continue to follow off milrinone 1 more day. May need home milrinone.  - CVP 3-4. Continue to hold lasix today.  - Continue digoxin 0.125.  - Continue spironolactone 25 daily  - Continue  Entresto 24/26 bid - Needs complete abstinence from ETOH.  - cMRI c/w ETOH CM  - Not candidate for advanced therapies unless he quits drinking and is compliant with followup.  2. Atrial flutter: s/p DCCV. Suspect recurrence risk is high.  - Remains in NSR.  - Continue PO amio to 200 bid - Continue Eliquis. No bleeding - Consider ablation in the future.  3. AKI on ?CKD stage 3: Possible baseline cardiorenal syndrome. Creatinine up to 2.9  post-DCCV, possibly stunning with transient cardiogenic shock.  Now trending down since starting milrinone.  Creatinine trending down 1.3->1.6 -> 1.4 -> 1.3. CVP low. Hold lasix.  4. Elevated LFTs: Suspect shock liver. LFTs continues to trending down.  5. ETOH abuse: Strongly urged cessation.  6. Smoking: Urged cessation.   Length of Stay: [address], PA-C  03/25/2020, 7:45 AM  Advanced Heart Failure Team Pager [phone] (M-F; 7a - 4p)  Please  contact [facility] for night-coverage after hours (4p -7a ) and weekends on [email or web address]  Patient seen with PA, agree with the above note.   Now off milrinone.  CVP low at 3-4 but co-ox lower at 52%.  Feels "fine," has been up walking around. Creatinine lower at 1.3.   General: NAD Neck: No JVD, no thyromegaly or thyroid nodule.  Lungs: Clear to auscultation bilaterally with normal respiratory effort. CV: Nondisplaced PMI.  Heart regular S1/S2, no S3/S4, no murmur.  No peripheral edema.   Abdomen: Soft, nontender, no hepatosplenomegaly, no distention.  Skin: Intact without lesions or rashes.  Neurologic: Alert and oriented x 3.  Psych: Normal affect. Extremities: No clubbing or cyanosis.  HEENT: Normal.   Stable today despite lower co-ox.  Will leave off milrinone.  No Lasix today with CVP 3-4.  - Continue Entresto, digoxin, and spironolactone.  - Will  add Farxiga 10 mg daily.   Continue amiodarone and Eliquis, remains in NSR.   Will send home tomorrow if co-ox stable to improved.   Marca Ancona 03/25/2020 12:52 PM

## 2020-03-25 NOTE — Progress Notes (Signed)
CARDIAC REHAB PHASE I   PRE:  Rate/Rhythm: 71 SR  BP:  Supine:   Sitting: 109/78  Standing:    SaO2: 98%RA  MODE:  Ambulation: 400 ft   POST:  Rate/Rhythm: 79 SR  BP:  Supine:   Sitting: 114/80  Standing:    SaO2: 99%RA 1430-1452 Pt walked 400 ft on RA with steady gait. Tolerated well. Encouraged daily weights and reviewed when to call MD with signs/ symptoms of CHF. Answered question about starting walking program and reviewed written guidelines he had been given. Voiced understanding of ed.    Luetta Nutting, RN BSN  03/25/2020 2:50 PM

## 2020-03-26 ENCOUNTER — Other Ambulatory Visit (HOSPITAL_COMMUNITY): Payer: Self-pay | Admitting: Student in an Organized Health Care Education/Training Program

## 2020-03-26 LAB — COMPREHENSIVE METABOLIC PANEL
ALT: 52 U/L — ABNORMAL HIGH (ref 0–44)
AST: 30 U/L (ref 15–41)
Albumin: 3.2 g/dL — ABNORMAL LOW (ref 3.5–5.0)
Alkaline Phosphatase: 60 U/L (ref 38–126)
Anion gap: 8 (ref 5–15)
BUN: 17 mg/dL (ref 6–20)
CO2: 27 mmol/L (ref 22–32)
Calcium: 8.9 mg/dL (ref 8.9–10.3)
Chloride: 104 mmol/L (ref 98–111)
Creatinine, Ser: 1.38 mg/dL — ABNORMAL HIGH (ref 0.61–1.24)
GFR, Estimated: >60 mL/min (ref >60)
Glucose, Bld: 114 mg/dL — ABNORMAL HIGH (ref 70–99)
Potassium: 4.6 mmol/L (ref 3.5–5.1)
Sodium: 139 mmol/L (ref 135–145)
Total Bilirubin: 0.9 mg/dL (ref 0.3–1.2)
Total Protein: 5.6 g/dL — ABNORMAL LOW (ref 6.5–8.1)

## 2020-03-26 LAB — CBC
HCT: 43.4 % (ref 39.0–52.0)
Hemoglobin: 13.4 g/dL (ref 13.0–17.0)
MCH: 29.8 pg (ref 26.0–34.0)
MCHC: 30.9 g/dL (ref 30.0–36.0)
MCV: 96.4 fL (ref 80.0–100.0)
Platelets: 215 10*3/uL (ref 150–400)
RBC: 4.5 MIL/uL (ref 4.22–5.81)
RDW: 11.9 % (ref 11.5–15.5)
WBC: 5.4 10*3/uL (ref 4.0–10.5)
nRBC: 0 % (ref 0.0–0.2)

## 2020-03-26 LAB — COOXEMETRY PANEL
Carboxyhemoglobin: 1.2 % (ref 0.5–1.5)
Methemoglobin: 0.6 % (ref 0.0–1.5)
O2 Saturation: 60.2 %
Total hemoglobin: 13.9 g/dL (ref 12.0–16.0)

## 2020-03-26 MED ORDER — DIGOXIN 125 MCG PO TABS: 0.1250 mg | ORAL_TABLET | Freq: Every day | ORAL | 0 refills | Status: DC

## 2020-03-26 MED ORDER — DAPAGLIFLOZIN PROPANEDIOL 10 MG PO TABS
10.0000 mg | ORAL_TABLET | Freq: Every day | ORAL | 0 refills | Status: DC
Start: 2020-03-27 — End: 2021-04-21

## 2020-03-26 MED ORDER — APIXABAN 5 MG PO TABS
5.0000 mg | ORAL_TABLET | Freq: Two times a day (BID) | ORAL | 0 refills | Status: DC
Start: 2020-03-26 — End: 2020-03-26

## 2020-03-26 MED ORDER — SPIRONOLACTONE 25 MG PO TABS
25.0000 mg | ORAL_TABLET | Freq: Every day | ORAL | 0 refills | Status: DC
Start: 2020-03-27 — End: 2020-04-23

## 2020-03-26 MED ORDER — SACUBITRIL-VALSARTAN 24-26 MG PO TABS: 1.0000 | ORAL_TABLET | Freq: Two times a day (BID) | ORAL | 0 refills | Status: DC

## 2020-03-26 MED ORDER — AMIODARONE HCL 200 MG PO TABS
200.0000 mg | ORAL_TABLET | Freq: Two times a day (BID) | ORAL | 0 refills | Status: DC
Start: 2020-03-26 — End: 2020-04-06

## 2020-03-26 MED FILL — SPIRONOLACTONE 25 MG TABLET: 25 | 30 days supply | Qty: 30 | Fill #0

## 2020-03-26 MED FILL — ELIQUIS 5 MG TABLET: 5 | 30 days supply | Qty: 60 | Fill #0

## 2020-03-26 MED FILL — ENTRESTO 24 MG-26 MG TABLET: 24-26 | 30 days supply | Qty: 60 | Fill #0

## 2020-03-26 MED FILL — AMIODARONE HCL 200 MG TAB: 200 | 30 days supply | Qty: 60 | Fill #0

## 2020-03-26 MED FILL — DIGOXIN 0.125 MG TABLET: 125 | 30 days supply | Qty: 30 | Fill #0

## 2020-03-26 NOTE — Discharge Instructions (Signed)
Thank you for letting us care for you during your stay.  You were admitted to the [facility] Medicine Teaching Service.   You were admitted for heart failure due to alcoholic cardiomyopathy.  It is very important that you take all of your medications as prescribed by the heart failure team and that you attend the follow-up appointments with them.  Alcohol cessation is also extremely important during this time and your new primary care provider should discuss alcohol treatment options for you.  Please follow up with [facility] on the day listed.  If your symptoms worsen or return, please return to the hospital.  Please let us know if you have questions about your stay at [facility].     Information on my medicine - ELIQUIS (apixaban)  Why was Eliquis prescribed for you? Eliquis was prescribed for you to reduce the risk of a blood clot forming that can cause a stroke if you have a medical condition called atrial fibrillation (a type of irregular heartbeat).  What do You need to know about Eliquis ? Take your Eliquis TWICE DAILY - one tablet in the morning and one tablet in the evening with or without food. If you have difficulty swallowing the tablet whole please discuss with your pharmacist how to take the medication safely.  Take Eliquis exactly as prescribed by your doctor and DO NOT stop taking Eliquis without talking to the doctor who prescribed the medication.  Stopping may increase your risk of developing a stroke.  Refill your prescription before you run out.  After discharge, you should have regular check-up appointments with your healthcare provider that is prescribing your Eliquis.  In the future your dose may need to be changed if your kidney function or weight changes by a significant amount or as you get older.  What do you do if you miss a dose? If you miss a dose, take it as soon as you remember on the same day  and resume taking twice daily.  Do not take more than one dose of ELIQUIS at the same time to make up a missed dose.  Important Safety Information A possible side effect of Eliquis is bleeding. You should call your healthcare provider right away if you experience any of the following: ? Bleeding from an injury or your nose that does not stop. ? Unusual colored urine (red or dark brown) or unusual colored stools (red or black). ? Unusual bruising for unknown reasons. ? A serious fall or if you hit your head (even if there is no bleeding).  Some medicines may interact with Eliquis and might increase your risk of bleeding or clotting while on Eliquis. To help avoid this, consult your healthcare provider or pharmacist prior to using any new prescription or non-prescription medications, including herbals, vitamins, non-steroidal anti-inflammatory drugs (NSAIDs) and supplements.  This website has more information on Eliquis (apixaban): http://[email or web address].com/eliquis/home

## 2020-03-26 NOTE — Progress Notes (Addendum)
Advanced Heart Failure Rounding Note  PCP-Cardiologist: Thurmon Fair, MD   Subjective:    10/6 CO-OX 52% started on milrinone 0.25 mcg and diuresed with IV lasix.   RHC/LHC 10/7 showed Normal RA pressure and mildly elevated PCWP, good cardiac output on milrinone. CI 3. Normal cors.   cMRI 10/8  1. Severe LV dilatation with severe systolic dysfunction (EF 13%) 2.  Moderate RV dilatation with severe systolic dysfunction (EF 20%) 3. 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 Basal septal midwall LGE, which is a scar pattern seen in nonischemic cardiomyopathies and associated with worse prognosis 5. RV insertion site LGE, which is a nonspecific finding often seen in setting of elevated pulmonary pressures 6.  Moderate mitral regurgitation (regurgitant fraction 32%) 7.  Small pericardial effusion  CO-OX stable off milrinone.   Denies SOB.   Objective:   Weight Range: 84.6 kg Body mass index is 25.29 kg/m.   Vital Signs:   Temp:  [97.7 F (36.5 C)-99.5 F (37.5 C)] 99.5 F (37.5 C) (10/14 0755) Pulse Rate:  [77-84] 80 (10/14 0905) Resp:  [13-18] 17 (10/14 0905) BP: (104-116)/(69-84) 116/84 (10/14 0905) SpO2:  [96 %-100 %] 98 % (10/14 0755) Weight:  [84.6 kg] 84.6 kg (10/14 0335) Last BM Date: 03/25/20  Weight change: Filed Weights   03/24/20 0431 03/25/20 0416 03/26/20 0335  Weight: 84.4 kg 84.3 kg 84.6 kg    Intake/Output:   Intake/Output Summary (Last 24 hours) at 03/26/2020 1126 Last data filed at 03/26/2020 0914 Gross per 24 hour  Intake 1300 ml  Output 2425 ml  Net -1125 ml      Physical Exam   CVP 3-4  General:  Well appearing. No resp difficulty HEENT: normal Neck: supple. no JVD. Carotids 2+ bilat; no bruits. No lymphadenopathy or thryomegaly appreciated. Cor: PMI nondisplaced. Regular rate & rhythm. No rubs, gallops or  murmurs. Lungs: clear Abdomen: soft, nontender, nondistended. No hepatosplenomegaly. No bruits or masses. Good bowel sounds. Extremities: no cyanosis, clubbing, rash, edema Neuro: alert & orientedx3, cranial nerves grossly intact. moves all 4 extremities w/o difficulty. Affect pleasant   Telemetry  NSR 60s  Labs    CBC Recent Labs    03/25/20 0422 03/26/20 0458  WBC 5.6 5.4  HGB 14.3 13.4  HCT 45.1 43.4  MCV 97.6 96.4  PLT 215 215   Basic Metabolic Panel Recent Labs    53/97/67 0422 03/26/20 0458  NA 137 139  K 4.7 4.6  CL 103 104  CO2 28 27  GLUCOSE 110* 114*  BUN 19 17  CREATININE 1.30* 1.38*  CALCIUM 8.8* 8.9   Liver Function Tests Recent Labs    03/25/20 0422 03/26/20 0458  AST 30 30  ALT 55* 52*  ALKPHOS 60 60  BILITOT 1.1 0.9  PROT 5.6* 5.6*  ALBUMIN 3.0* 3.2*   No results for input(s): LIPASE, AMYLASE in the last 72 hours. Cardiac Enzymes No results for input(s): CKTOTAL, CKMB, CKMBINDEX, TROPONINI in the last 72 hours.  BNP: BNP (last 3 results) Recent Labs    03/16/20 0800  BNP 1,394.3*    ProBNP (last 3 results) No results for input(s): PROBNP in the last 8760 hours.   D-Dimer No results for input(s): DDIMER in the last 72 hours. Hemoglobin A1C No results for input(s): HGBA1C in the last 72 hours. Fasting Lipid Panel No results for input(s): CHOL, HDL, LDLCALC, TRIG, CHOLHDL, LDLDIRECT in the last  72 hours. Thyroid Function Tests No results for input(s): TSH, T4TOTAL, T3FREE, THYROIDAB in the last 72 hours.  Invalid input(s): FREET3  Other results:   Imaging    No results found.   Medications:     Scheduled Medications: . amiodarone  200 mg Oral BID  . apixaban  5 mg Oral BID  . Chlorhexidine Gluconate Cloth  6 each Topical Daily  . dapagliflozin propanediol  10 mg Oral Daily  . digoxin  0.125 mg Oral Daily  . folic acid  1 mg Oral Daily  . multivitamin with minerals  1 tablet Oral Daily  . polyethylene glycol   17 g Oral BID  . sacubitril-valsartan  1 tablet Oral BID  . senna  1 tablet Oral Daily  . spironolactone  25 mg Oral Daily  . thiamine  100 mg Oral Daily    Infusions:   PRN Medications: acetaminophen, sodium chloride flush     Assessment/Plan   1. Acute on chronic systolic CHF: Biventricular failure, TEE this admission with EF<20%, severe RV dysfunction, severe MR. Cause of cardiomyopathy uncertain, possibly due to ETOH (heavy).  Cath today showed no significant coronary disease. Has not been on meds at home. Co-ox initially 52% on 10/6, he was started on milrinone 0.25 and diuresed with excellent response.  Normal RA pressure and mildly elevated PCWP, good cardiac output on milrinone.  - Milrinone discontinued 10/12.  - CO-OX stable off milrinone.  - Volume status stable. .  - Continue digoxin 0.125.  - Continue spironolactone 25 daily  - Continue  Entresto 24/26 bid - Continue farxiga 10 mg daily  - Needs complete abstinence from ETOH.  - cMRI c/w ETOH CM  - Not candidate for advanced therapies unless he quits drinking and is compliant with followup.  2. Atrial flutter: s/p DCCV. Suspect recurrence risk is high.  -Maintaining NSr.  - Continue PO amio to 200 bid - Continue Eliquis. No bleeding - Consider ablation in the future.  3. AKI on ?CKD stage 3: Possible baseline cardiorenal syndrome. Creatinine up to 2.9  post-DCCV, possibly stunning with transient cardiogenic shock.  Now trending down since starting milrinone.  Renal function stable.  4. Elevated LFTs: Suspect shock liver. LFTs continues to trending down.  5. ETOH abuse: Strongly urged cessation.  6. Smoking: Urged cessation.  Ok to d/c home. Needs PICC removed - HF Meds for d/c Digoxin 0.125 mg mg Spironolactone 25 mg daily  Entresto 24-26 mg twice a day  Farxiga 10 mg daily Amio 200 mg twice a day  Eliquis 5 mg twice a day  He has follow up in the HF clinic 04/06/20 . Plan to follow closely in the HF  clinic.    Length of Stay: 10  Tonye Becket, NP  03/26/2020, 11:26 AM  Advanced Heart Failure Team Pager [phone] (M-F; 7a - 4p)  Please contact [facility] for night-coverage after hours (4p -7a ) and weekends on [email or web address]  Patient seen with NP, agree with the above note.   Co-ox 60% today, creatinine 1.38.  BP stable.   No complaints, ready to go home.   General: NAD Neck: No JVD, no thyromegaly or thyroid nodule.  Lungs: Clear to auscultation bilaterally with normal respiratory effort. CV: Nondisplaced PMI.  Heart regular S1/S2, no S3/S4, no murmur.  No peripheral edema.   Abdomen: Soft, nontender, no hepatosplenomegaly, no distention.  Skin: Intact without lesions or rashes.  Neurologic: Alert and oriented x 3.  Psych: Normal affect. Extremities: No clubbing  or cyanosis.  HEENT: Normal.   I think he is ready for home today.  See cardiac med list above, we will arrange followup in CHF clinic.  Needs complete ETOH cessation.   Marca Ancona 03/26/2020 12:07 PM

## 2020-03-26 NOTE — Progress Notes (Signed)
D/C instructions given and reviewed. No questions asked at this time, encouraged to call with any concerns.  

## 2020-03-26 NOTE — Plan of Care (Signed)
  Problem: Education: Goal: Knowledge of General Education information will improve Description: Including pain rating scale, medication(s)/side effects and non-pharmacologic comfort measures Outcome: Adequate for Discharge   Problem: Health Behavior/Discharge Planning: Goal: Ability to manage health-related needs will improve Outcome: Adequate for Discharge   Problem: Clinical Measurements: Goal: Ability to maintain clinical measurements within normal limits will improve Outcome: Adequate for Discharge Goal: Will remain free from infection Outcome: Adequate for Discharge Goal: Diagnostic test results will improve Outcome: Adequate for Discharge Goal: Respiratory complications will improve Outcome: Adequate for Discharge Goal: Cardiovascular complication will be avoided Outcome: Adequate for Discharge   Problem: Nutrition: Goal: Adequate nutrition will be maintained Outcome: Adequate for Discharge   Problem: Coping: Goal: Level of anxiety will decrease Outcome: Adequate for Discharge   Problem: Elimination: Goal: Will not experience complications related to bowel motility Outcome: Adequate for Discharge Goal: Will not experience complications related to urinary retention Outcome: Adequate for Discharge   Problem: Pain Managment: Goal: General experience of comfort will improve Outcome: Adequate for Discharge   Problem: Safety: Goal: Ability to remain free from injury will improve Outcome: Adequate for Discharge   Problem: Skin Integrity: Goal: Risk for impaired skin integrity will decrease Outcome: Adequate for Discharge   Problem: Education: Goal: Ability to demonstrate management of disease process will improve Outcome: Adequate for Discharge Goal: Ability to verbalize understanding of medication therapies will improve Outcome: Adequate for Discharge Goal: Individualized Educational Video(s) Outcome: Adequate for Discharge   Problem: Activity: Goal: Capacity  to carry out activities will improve Outcome: Adequate for Discharge   Problem: Cardiac: Goal: Ability to achieve and maintain adequate cardiopulmonary perfusion will improve Outcome: Adequate for Discharge   Problem: Education: Goal: Knowledge of disease or condition will improve Outcome: Adequate for Discharge Goal: Understanding of medication regimen will improve Outcome: Adequate for Discharge Goal: Individualized Educational Video(s) Outcome: Adequate for Discharge   Problem: Activity: Goal: Ability to tolerate increased activity will improve Outcome: Adequate for Discharge   Problem: Cardiac: Goal: Ability to achieve and maintain adequate cardiopulmonary perfusion will improve Outcome: Adequate for Discharge   Problem: Health Behavior/Discharge Planning: Goal: Ability to safely manage health-related needs after discharge will improve Outcome: Adequate for Discharge

## 2020-03-26 NOTE — Progress Notes (Signed)
Family Medicine Teaching Service Daily Progress Note Intern Pager: [phone]  Patient name: Bryan Guerrero Medical record number: [id number] Date of birth: 1971-02-02 Age: 49 y.o. Gender: male  Primary Care Provider: Reece Leader, DO Consultants: Cardiology Code Status: Full  Pt Overview and Major Events to Date:  Admitted on 10/4 Started on milrinone and amiodarone gtt 10/6 Right/Left Cath on 10/7, no CAD   Assessment and Plan:  Mr. Kosh a 49 yr oldmalepresenting with acute on chronic exacerbation of heart failure with reduced EF. PMHx is significant for hypertension, alcoholic cardiomyopathy and congestive heart failure.   Acute on Systolic CHF  Biventricular Heartfailure  Alcoholic-related nonischemic cardiomyopathy TEE demonstratedLV EF ~ <20% and G3DD. PICC placed for CVP milrinone (ended 10/12). Right/left cath demonstrated no CAD, mildly elevated PCWP. cMRI consistent with EtOH cardiomyopathy. He has good cardiac ouput on milrinone 0.25.Net output is -25 cc. Weight continues to down trend. Creatinine today is improved to 1.38. Will continue to hold Lasix. -Advanced heart failure team following, appreciate recommendations -HoldLasix oral 80 mg -Continue Digoxin 0.125 mg daily -ContinueEntresto 24-26 mgdaily -Continue oral Amiodarone 200 mg BID -Continue Spironolactone 25 mg daily -Continue Farxiga 10 mg daily -Continue to monitor Co-Ox, per HF -Continuous cardiac monitoring -Daily weights with strict I/Os -f/u cardiology outpatient -SW to establish care with PCP, encourage adherence to prescribed medications   New Onset Atrial Flutter Rate controlled s/p cardioversion.CHADS Vasc score of 2. Per cardiology possible ablation in the future. -Appreciate Cardiology recommendations -Stop Coreg 3.125 mg BID  -Continue Eliquis 5 mg BID --Continue oral Amiodarone 200 mg BID    Elevated LFTs (resolved): Likely secondary to volume overload in setting  of HF, patient did not previously have significant elevations.Acute hepatitis panel was negative. LFTs improving. AST 30and ALT 52. T bili is 0.9. -AM CMP   HTN Normotensive.Patient was told last admission that he had hypertension and was started on Losartan. Nohome meds. -ContinueEntresto 24-26 mgdaily -Continue Spironolactone 12.5 mg daily -Stop carvedilil 3.125 mg,  -Continue to monitor BP    EtOH Abuse Patient drinks afifthof liquor over a 3-4 day course along with 2-3 cans of beer daily. Has not had any alcohol since10/4. Denies ever being hospitalized for alcohol withdrawal.  -Continue multivitamin daily -Continue Thiamin100 mgdaily -Continue Folic acid1 mgdaily -Continueto encourage abstinence from alcohol  -TOC consult for EtOH resources    JHE:RDEY baseline from 2020. Creatinine was 1.63 on admission, baseline appears to be around 1.2-1.3. Likely due to poor perfusion/volume overload with HFand previously uncontrolled HTN. Creatinine today is1.38most likely due to starting ARB, will continue to monitor. -Continue to trendCr with AM CMP  -Avoid nephrotoxic agents   Non thyroidal illness syndrome TSH wnl and elevated T4 1.39. No prior diagnosis. -Repeat TSH in 4-6 weeks   Tobacco Use Disorder -Nicotine patch available upon request   FEN/GI: Heart Healthy/ Miralax PPx: Eliquis  Disposition: Cards tele  Prior to Admission Living Arrangement: Home Anticipated Discharge Location: Home Barriers to Discharge: Stabilization of HF Anticipated discharge in approximately 0-2 day(s).   Subjective:  Interviewed patient at bedside.  He reports doing well. Has no complaints today. Discussed that plan per cardiology was possible discharge today.   Objective: Temp:  [97.7 F (36.5 C)-98.8 F (37.1 C)] 98.4 F (36.9 C) (10/14 0335) Pulse Rate:  [76-84] 84 (10/14 0335) Resp:  [13-18] 16 (10/14 0500) BP: (104-114)/(69-82) 113/72  (10/14 0335) SpO2:  [96 %-100 %] 100 % (10/14 0335) Weight:  [84.6 kg] 84.6 kg (10/14 0335) Physical  Exam:  Physical Exam Vitals and nursing note reviewed.  Constitutional:      General: He is not in acute distress.    Appearance: Normal appearance. He is normal weight. He is not ill-appearing, toxic-appearing or diaphoretic.  HENT:     Head: Normocephalic and atraumatic.  Cardiovascular:     Rate and Rhythm: Normal rate and regular rhythm.     Pulses: Normal pulses.          Radial pulses are 2+ on the right side and 2+ on the left side.       Dorsalis pedis pulses are 2+ on the right side and 2+ on the left side.     Heart sounds: Normal heart sounds, S1 normal and S2 normal. No murmur heard.   Pulmonary:     Effort: Pulmonary effort is normal. No respiratory distress.     Breath sounds: Normal breath sounds. No wheezing.  Abdominal:     General: There is no distension.     Palpations: There is no mass.     Tenderness: There is no abdominal tenderness.  Musculoskeletal:     Right lower leg: No edema.     Left lower leg: No edema.  Neurological:     Mental Status: He is alert. Mental status is at baseline.  Psychiatric:        Mood and Affect: Mood normal.        Behavior: Behavior normal.        Thought Content: Thought content normal.      Laboratory: Recent Labs  Lab 03/24/20 0545 03/25/20 0422 03/26/20 0458  WBC 5.5 5.6 5.4  HGB 13.8 14.3 13.4  HCT 43.9 45.1 43.4  PLT 205 215 215   Recent Labs  Lab 03/24/20 0545 03/25/20 0422 03/26/20 0458  NA 137 137 139  K 4.5 4.7 4.6  CL 101 103 104  CO2 29 28 27   BUN 20 19 17   CREATININE 1.46* 1.30* 1.38*  CALCIUM 8.7* 8.8* 8.9  PROT 5.2* 5.6* 5.6*  BILITOT 0.9 1.1 0.9  ALKPHOS 63 60 60  ALT 63* 55* 52*  AST 34 30 30  GLUCOSE 113* 110* 114*      Imaging/Diagnostic Tests:   MR Cardiac Morphology W WO Contrast:Severe LV dilatation with severe systolic dysfunction (EF 13%) Moderate RV dilatation with  severe systolic dysfunction (EF 20%) 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   CT Abdomen Pelvis Wo Contrast:Small amount of free fluid is noted in the dependent portion of the pelvis of unknown etiology. The finding which was described as free air on prior CT exam of same day actually appears to represent a portion of colon that is interposed between the liver and diaphragm. There is no evidence of pneumoperitoneum. No significant abnormality is noted in the abdomen or pelvis.   CT Angio Chest PE W/cm &/Or WO is pneumoperitoneum with upper abdominal ascites. Etiology for this pneumoperitoneum uncertain. Question recent surgery as a potential etiology. If patient has not had recent surgical procedure involving the abdomen or pelvis, perforated viscus must be of concern based on this appearance. No evident pulmonary embolus. No thoracic aortic aneurysm. No dissection evident; note that the  contrast bolus in the aorta is not sufficient for dissection assessment. Scattered areas of ill-defined airspace opacity in the upper lobes raises question for underlying atypical organism pneumonia. Check of COVID-19 status advised in this regard. No consolidation or pleural effusions. Lower lobe bronchiectatic change bilaterally. Scattered areas of lower lobe scarring. No evident adenopathy. Cardiomegaly.   DG Chest 2 View:Similar enlargement of the cardiopericardial silhouette. No acute findings.   Lauro Franklin, MD 03/26/2020, 6:27 AM PGY-1, [facility] FPTS Intern pager: [phone], text pages welcome

## 2020-03-26 NOTE — Progress Notes (Signed)
FPTS Interim Progress Note  S: Went to speak with patient about EtOH cessation and discharge. He said that he has been given the AA paperwork by Social Work and encouraged attending. Also discussed that [facility] would bring his medications to his room before leaving.   O: BP 119/85 (BP Location: Left Arm)   Pulse 73   Temp 99 F (37.2 C) (Oral)   Resp 18   Ht 6' (1.829 m)   Wt 84.6 kg   SpO2 100%   BMI 25.29 kg/m        Lauro Franklin, MD 03/26/2020, 1:34 PM PGY-1, [facility] Family Medicine Service pager [phone]

## 2020-03-26 NOTE — TOC Transition Note (Signed)
Transition of Care [facility]) - CM/SW Discharge Note   Patient Details  Name: Bryan Guerrero MRN: [id number] Date of Birth: 10-03-1970  [facility] CM/SW Contact:  Leone Haven, RN Phone Number: 03/26/2020, 4:00 PM   Clinical Narrative:    Patient is for dc today, TOC filled medications with [facility] for patient , he had follow up with [facility]. He has no other needs.    Final next level of care: Home/Self Care Barriers to Discharge: No Barriers Identified   Patient Goals and CMS Choice        Discharge Placement                       Discharge Plan and Services                  DME Agency: NA       HH Arranged: NA          Social Determinants of Health (SDOH) Interventions Food Insecurity Interventions: Intervention Not Indicated Housing Interventions: Intervention Not Indicated Transportation Interventions: Intervention Not Indicated Alcohol Brief Interventions/Follow-up: Alcohol Education   Readmission Risk Interventions No flowsheet data found.

## 2020-03-27 ENCOUNTER — Encounter (HOSPITAL_COMMUNITY): Payer: Self-pay | Admitting: *Deleted

## 2020-04-01 ENCOUNTER — Telehealth (HOSPITAL_COMMUNITY): Payer: Self-pay | Admitting: Licensed Clinical Social Worker

## 2020-04-01 ENCOUNTER — Telehealth (HOSPITAL_COMMUNITY): Payer: Self-pay | Admitting: Pharmacy Technician

## 2020-04-01 NOTE — Telephone Encounter (Signed)
CSW attempted to call patient to check in how he is doing at home after discharge last week.  Wanting to discuss if he has been successful at home continue to abstain from alcohol and also touch base about insurance and medication concerns.  Unable to reach- left message requesting return call  Burna Sis, LCSW Clinical Social Worker Advanced Heart Failure Clinic Desk#: [phone] Cell#: [phone]

## 2020-04-01 NOTE — Telephone Encounter (Signed)
Patient was started on Entresto and Eliquis inpatient. The patient is currently uninsured. Started an application for [employer] and [employer] patient assistance. Will have ready for the patient to sign at Mondays appointment.   Will follow up.

## 2020-04-06 ENCOUNTER — Ambulatory Visit (HOSPITAL_COMMUNITY)
Admit: 2020-04-06 | Discharge: 2020-04-06 | Disposition: A | Discharge status: Home / Self Care | Payer: Self-pay | Source: Ambulatory Visit | Attending: Cardiology | Admitting: Cardiology

## 2020-04-06 ENCOUNTER — Encounter (HOSPITAL_COMMUNITY): Payer: Self-pay

## 2020-04-06 ENCOUNTER — Telehealth (HOSPITAL_COMMUNITY): Payer: Self-pay

## 2020-04-06 ENCOUNTER — Other Ambulatory Visit: Payer: Self-pay

## 2020-04-06 VITALS — BP 130/87 | HR 83 | Wt 192.6 lb

## 2020-04-06 DIAGNOSIS — Z7984 Long term (current) use of oral hypoglycemic drugs: Secondary | ICD-10-CM | POA: Insufficient documentation

## 2020-04-06 DIAGNOSIS — N183 Chronic kidney disease, stage 3 unspecified: Secondary | ICD-10-CM | POA: Insufficient documentation

## 2020-04-06 DIAGNOSIS — I5082 Biventricular heart failure: Secondary | ICD-10-CM | POA: Insufficient documentation

## 2020-04-06 DIAGNOSIS — Z79899 Other long term (current) drug therapy: Secondary | ICD-10-CM | POA: Insufficient documentation

## 2020-04-06 DIAGNOSIS — I429 Cardiomyopathy, unspecified: Secondary | ICD-10-CM | POA: Insufficient documentation

## 2020-04-06 DIAGNOSIS — I130 Hypertensive heart and chronic kidney disease with heart failure and stage 1 through stage 4 chronic kidney disease, or unspecified chronic kidney disease: Secondary | ICD-10-CM | POA: Insufficient documentation

## 2020-04-06 DIAGNOSIS — I5022 Chronic systolic (congestive) heart failure: Secondary | ICD-10-CM

## 2020-04-06 DIAGNOSIS — Z87891 Personal history of nicotine dependence: Secondary | ICD-10-CM | POA: Insufficient documentation

## 2020-04-06 DIAGNOSIS — Z7901 Long term (current) use of anticoagulants: Secondary | ICD-10-CM | POA: Insufficient documentation

## 2020-04-06 DIAGNOSIS — Z8249 Family history of ischemic heart disease and other diseases of the circulatory system: Secondary | ICD-10-CM | POA: Insufficient documentation

## 2020-04-06 DIAGNOSIS — I4892 Unspecified atrial flutter: Secondary | ICD-10-CM

## 2020-04-06 DIAGNOSIS — F1010 Alcohol abuse, uncomplicated: Secondary | ICD-10-CM | POA: Insufficient documentation

## 2020-04-06 DIAGNOSIS — I340 Nonrheumatic mitral (valve) insufficiency: Secondary | ICD-10-CM | POA: Insufficient documentation

## 2020-04-06 LAB — CBC
HCT: 43.1 % (ref 39.0–52.0)
Hemoglobin: 13 g/dL (ref 13.0–17.0)
MCH: 29.2 pg (ref 26.0–34.0)
MCHC: 30.2 g/dL (ref 30.0–36.0)
MCV: 96.9 fL (ref 80.0–100.0)
Platelets: 298 10*3/uL (ref 150–400)
RBC: 4.45 MIL/uL (ref 4.22–5.81)
RDW: 11.8 % (ref 11.5–15.5)
WBC: 4.1 10*3/uL (ref 4.0–10.5)
nRBC: 0 % (ref 0.0–0.2)

## 2020-04-06 LAB — BASIC METABOLIC PANEL
Anion gap: 8 (ref 5–15)
BUN: 12 mg/dL (ref 6–20)
CO2: 24 mmol/L (ref 22–32)
Calcium: 9.5 mg/dL (ref 8.9–10.3)
Chloride: 107 mmol/L (ref 98–111)
Creatinine, Ser: 1.24 mg/dL (ref 0.61–1.24)
GFR, Estimated: >60 mL/min (ref >60)
Glucose, Bld: 105 mg/dL — ABNORMAL HIGH (ref 70–99)
Potassium: 4.4 mmol/L (ref 3.5–5.1)
Sodium: 139 mmol/L (ref 135–145)

## 2020-04-06 LAB — DIGOXIN LEVEL: Digoxin Level: 1.3 ng/mL (ref 1.0–2.0)

## 2020-04-06 MED ORDER — ENTRESTO 49-51 MG PO TABS: 1.0000 | ORAL_TABLET | Freq: Two times a day (BID) | ORAL | 3 refills | Status: DC

## 2020-04-06 MED ORDER — AMIODARONE HCL 200 MG PO TABS
200.0000 mg | ORAL_TABLET | Freq: Every day | ORAL | 3 refills | Status: DC
Start: 2020-04-06 — End: 2020-05-25

## 2020-04-06 MED ORDER — DIGOXIN 62.5 MCG PO TABS: 0.0625 mg | ORAL_TABLET | Freq: Every day | ORAL | 3 refills | Status: DC

## 2020-04-06 MED FILL — FARXIGA 10 MG TABLET: 10 | 34 days supply | Qty: 34 | Fill #0

## 2020-04-06 NOTE — Telephone Encounter (Signed)
-----   Message from Allayne Butcher, [place] sent at 04/06/2020  4:11 PM EDT ----- Dig level elevated. Hold Digoxin x 3 days, then reduce down to 0.0625 mg daily. Repeat Dig level in 1 week

## 2020-04-06 NOTE — Progress Notes (Signed)
Advanced Heart Failure Clinic Note   Referring Physician: PCP: Reece Leader, DO PCP-Cardiologist: Thurmon Fair, MD  AHF: Dr. Shirlee Latch   Reason for Visit: [facility] F/u for systolic heart failure and atrial flutter   HPI:  Mr Janes is a 49 year old with history of chronic systolic heart failure dating back to 2020, ETOH abuse, HTN, and tobacco abuse.    Admitted back in February 2020 with increased shortness of breath. At that time he was drinking 1/5 of licquor per day. ECHO completed and showed severely reduced EF 15-20%. 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 No formal cath. He was discharged 08/08/19. He returned for one appointment with cardiology but no return f/u after that.   Recently admitted 10/21 for a/c systolic heart failure + atrial flutter w/ RVR requiring DCCV. Also w/ low output requiring milrinone. R/LHC showed mildly elevated PCWP, normal RA pressure, normal cardiac output on milrinone and no significant coronary disease. Echo showed severe LVEF <20%, severe RV dysfunction and severe MR. He was diuresed w/ IV Lasix. Was able to wean off milrinone. Discharge wt was 186 lb.   He presents to clinic today for f/u. Doing well. No significant complaints. NYHA Class II. Reports full compliance w/ mediations. Tolerating well. No side effects. Wt today 192 lb, up 6 lb since d/c. States that he has cut back significantly on ETOH.    Review of systems complete and found to be negative unless listed in HPI.      Past Medical History:  Diagnosis Date  . Alcohol abuse   . Asthma   . Cardiomyopathy (HCC)   . Dyspnea   . Hypertension     Current Outpatient Medications  Medication Sig Dispense Refill  . amiodarone (PACERONE) 200 MG tablet Take 1 tablet (200 mg total) by mouth 2 (two) times daily. 60 tablet 0  . apixaban (ELIQUIS) 5 MG TABS tablet Take 1 tablet (5 mg total) by mouth 2 (two) times daily. 60 tablet 0  . dapagliflozin propanediol (FARXIGA) 10 MG TABS  tablet Take 1 tablet (10 mg total) by mouth daily. 30 tablet 0  . digoxin (LANOXIN) 0.125 MG tablet Take 1 tablet (0.125 mg total) by mouth daily. 30 tablet 0  . EPINEPHrine (PRIMATENE MIST) 0.125 MG/ACT AERO Inhale 1 puff into the lungs 3 (three) times daily as needed (shortness od breath).    . Multiple Vitamin (MULTIVITAMIN WITH MINERALS) TABS tablet Take 1 tablet by mouth daily.    . sacubitril-valsartan (ENTRESTO) 24-26 MG Take 1 tablet by mouth 2 (two) times daily. 60 tablet 0  . spironolactone (ALDACTONE) 25 MG tablet Take 1 tablet (25 mg total) by mouth daily. 30 tablet 0   No current facility-administered medications for this encounter.    No Known Allergies    Social History   Socioeconomic History  . Marital status: Single    Spouse name: Not on file  . Number of children: Not on file  . Years of education: Not on file  . Highest education level: Not on file  Occupational History  . Not on file  Tobacco Use  . Smoking status: Former Smoker    Years: 15.00    Types: Cigars    Quit date: 07/14/2018    Years since quitting: 1.7  . Smokeless tobacco: Never Used  Vaping Use  . Vaping Use: Never used  Substance and Sexual Activity  . Alcohol use: Yes    Alcohol/week: 51.0 standard drinks    Types: 1 Cans of beer,  50 Shots of liquor per week  . Drug use: Yes    Types: Marijuana  . Sexual activity: Not on file  Other Topics Concern  . Not on file  Social History Narrative  . Not on file   Social Determinants of Health   Financial Resource Strain:   . Difficulty of Paying Living Expenses: Not on file  Food Insecurity: No Food Insecurity  . Worried About Programme researcher, broadcasting/film/video in the Last Year: Never true  . Ran Out of Food in the Last Year: Never true  Transportation Needs: No Transportation Needs  . Lack of Transportation (Medical): No  . Lack of Transportation (Non-Medical): No  Physical Activity:   . Days of Exercise per Week: Not on file  . Minutes of Exercise  per Session: Not on file  Stress:   . Feeling of Stress : Not on file  Social Connections:   . Frequency of Communication with Friends and Family: Not on file  . Frequency of Social Gatherings with Friends and Family: Not on file  . Attends Religious Services: Not on file  . Active Member of Clubs or Organizations: Not on file  . Attends Banker Meetings: Not on file  . Marital Status: Not on file  Intimate Partner Violence:   . Fear of Current or Ex-Partner: Not on file  . Emotionally Abused: Not on file  . Physically Abused: Not on file  . Sexually Abused: Not on file      Family History  Problem Relation Age of Onset  . Hypertension Father     Vitals:   04/06/20 0845  BP: 130/87  Pulse: 83  SpO2: 99%  Weight: 87.4 kg     PHYSICAL EXAM: General:  Well appearing. No respiratory difficulty HEENT: normal Neck: supple. no JVD. Carotids 2+ bilat; no bruits. No lymphadenopathy or thyromegaly appreciated. Cor: PMI nondisplaced. Regular rate & rhythm. No rubs, gallops or murmurs. Lungs: clear Abdomen: soft, nontender, nondistended. No hepatosplenomegaly. No bruits or masses. Good bowel sounds. Extremities: no cyanosis, clubbing, rash, edema Neuro: alert & oriented x 3, cranial nerves grossly intact. moves all 4 extremities w/o difficulty. Affect pleasant.  ECG: NSR 70 bpm    ASSESSMENT & PLAN:  1. Chronic Systolic CHF: recent admit 10/21 for a/c CHF w/ low output requiring milrinone. Biventricular failure. Echo 10/21 EF < 20%, severe RV dysfunction, severe MR. LHC showed no significant coronary disease. CO good on milrinone, was able to wean off. cMRI c/w ETOH CM  - NYHA Class II - wt up 6 lb since discharge.  - Continue digoxin 0.125.Check dig level  -Continue spironolactone 25 daily  - Increase  Entresto to 49-51 mg bid - Continue farxiga 10 mg daily  - check BMP today and again in 7 days - Needs complete abstinence from ETOH. Disused this in detail  today  - Not candidate for advanced therapies unless he quits drinkingand is compliant with followup.  2. Atrial flutter: s/p DCCV 10/21. Suspect recurrence risk is high.  - maintaining NSR on amiodarone by EKG today  - Reduce amiodarone to 200 once daily  - Continue Eliquis. No bleeding. Check CBC today  - Consider ablation in the future. Will refer to EP  3. Stage 3 CKD: recent SCr during hospitalization spiked to 2.9, in setting of low output/ cardiorenal. Improved w/ inotropes and diuresis, down to 1.4 day of discharge - repeat BMP  5. ETOH abuse: Strongly urged cessation.  6. Smoking: Urged cessation. 7.  MR- severe on echo, likely functional - denies dyspnea/ CP   Return in 3-4 weeks for further med titration.   Robbie Lis, PA-C 04/06/20

## 2020-04-06 NOTE — Progress Notes (Signed)
Filled out Heart Failure Fund form for patient and faxed to [facility] at [phone]

## 2020-04-06 NOTE — Patient Instructions (Signed)
DECREASE Amiodarone 200mg  (1 tablet) Daily  INCREASE Entresto 49/51mg  (1 tablet) twice daily  You have been referred to EP Clinic for your Atrial Flutter. They will contact you to schedule an appointment  Labs done today, your results will be available in MyChart, we will contact you for abnormal readings.  Your physician recommends that you return for repeat labs in 7-10 days  Your physician recommends that you return for medication adjustments in 3-4 weeks  If you have any questions or concerns before your next appointment please send 9-10 a message through [place] or call our office at [phone].    TO LEAVE A MESSAGE FOR THE NURSE SELECT OPTION 2, PLEASE LEAVE A MESSAGE INCLUDING: . YOUR NAME . DATE OF BIRTH . CALL BACK NUMBER . REASON FOR CALL**this is important as we prioritize the call backs  YOU WILL RECEIVE A CALL BACK THE SAME DAY AS LONG AS YOU CALL BEFORE 4:00 PM

## 2020-04-06 NOTE — Telephone Encounter (Signed)
Spoke with patient in regards to signing an application for AZ&Me for his [place]. The application will be at the check in desk for him to sign. I advised him we would send off the applications and get back with him on a determination.  Will follow up.

## 2020-04-06 NOTE — Telephone Encounter (Signed)
Samara Snide, RN  04/06/2020 4:27 PM EDT Back to Top    Patient advised and verbalized understanding. Lab appt already scheduled for 11/4. Med list updated, lab orders placed   Samara Snide, RN  04/06/2020 4:21 PM EDT     Left message to return call

## 2020-04-08 NOTE — Telephone Encounter (Signed)
Sent in applications via fax.  Will follow up.  

## 2020-04-09 NOTE — Telephone Encounter (Addendum)
Advanced Heart Failure Patient Advocate Encounter   Patient was approved to receive Entresto from [employer]  Patient ID: [id number] Effective dates: 04/09/20 through 04/09/21  Left message regarding approval.

## 2020-04-10 ENCOUNTER — Other Ambulatory Visit: Payer: Self-pay

## 2020-04-10 ENCOUNTER — Ambulatory Visit: Payer: Self-pay | Attending: Family Medicine | Admitting: Family Medicine

## 2020-04-10 ENCOUNTER — Encounter: Payer: Self-pay | Admitting: Family Medicine

## 2020-04-10 VITALS — BP 125/86 | HR 75 | Temp 97.0°F | Ht 72.44 in | Wt 191.2 lb

## 2020-04-10 DIAGNOSIS — Z7689 Persons encountering health services in other specified circumstances: Secondary | ICD-10-CM

## 2020-04-10 DIAGNOSIS — I4892 Unspecified atrial flutter: Secondary | ICD-10-CM

## 2020-04-10 DIAGNOSIS — Z09 Encounter for follow-up examination after completed treatment for conditions other than malignant neoplasm: Secondary | ICD-10-CM

## 2020-04-10 DIAGNOSIS — I5022 Chronic systolic (congestive) heart failure: Secondary | ICD-10-CM

## 2020-04-10 NOTE — Patient Instructions (Signed)
Heart Failure, Diagnosis  Heart failure means that your heart is not able to pump blood in the right way. This makes it hard for your body to work well. Heart failure is usually a long-term (chronic) condition. You must take good care of yourself and follow your treatment plan from your doctor. What are the causes? This condition may be caused by:  High blood pressure.  Build up of cholesterol and fat in the arteries.  Heart attack. This injures the heart muscle.  Heart valves that do not open and close properly.  Damage of the heart muscle. This is also called cardiomyopathy.  Lung disease.  Abnormal heart rhythms. What increases the risk? The risk of heart failure goes up as a person ages. This condition is also more likely to develop in people who:  Are overweight.  Are male.  Smoke or chew tobacco.  Abuse alcohol or illegal drugs.  Have taken medicines that can damage the heart.  Have diabetes.  Have abnormal heart rhythms.  Have thyroid problems.  Have low blood counts (anemia). What are the signs or symptoms? Symptoms of this condition include:  Shortness of breath.  Coughing.  Swelling of the feet, ankles, legs, or belly.  Losing weight for no reason.  Trouble breathing.  Waking from sleep because of the need to sit up and get more air.  Rapid heartbeat.  Being very tired.  Feeling dizzy, or feeling like you may pass out (faint).  Having no desire to eat.  Feeling like you may vomit (nauseous).  Peeing (urinating) more at night.  Feeling confused. How is this treated?     This condition may be treated with:  Medicines. These can be given to treat blood pressure and to make the heart muscles stronger.  Changes in your daily life. These may include eating a healthy diet, staying at a healthy body weight, quitting tobacco and illegal drug use, or doing exercises.  Surgery. Surgery can be done to open blocked valves, or to put devices in  the heart, such as pacemakers.  A donor heart (heart transplant). You will receive a healthy heart from a donor. Follow these instructions at home:  Treat other conditions as told by your doctor. These may include high blood pressure, diabetes, thyroid disease, or abnormal heart rhythms.  Learn as much as you can about heart failure.  Get support as you need it.  Keep all follow-up visits as told by your doctor. This is important. Summary  Heart failure means that your heart is not able to pump blood in the right way.  This condition is caused by high blood pressure, heart attack, or damage of the heart muscle.  Symptoms of this condition include shortness of breath and swelling of the feet, ankles, legs, or belly. You may also feel very tired or feel like you may vomit.  You may be treated with medicines, surgery, or changes in your daily life.  Treat other health conditions as told by your doctor. This information is not intended to replace advice given to you by your health care provider. Make sure you discuss any questions you have with your health care provider. Document Revised: 08/17/2018 Document Reviewed: 08/17/2018 Elsevier Patient Education  2020 Elsevier Inc.  Heart Failure Action Plan A heart failure action plan helps you understand what to do when you have symptoms of heart failure. Follow the plan that was created by you and your health care provider. Review your plan each time you visit your   health care provider. Red zone These signs and symptoms mean you should get medical help right away:  You have trouble breathing when resting.  You have a dry cough that is getting worse.  You have swelling or pain in your legs or abdomen that is getting worse.  You suddenly gain more than 2-3 lb (0.9-1.4 kg) in a day, or more than 5 lb (2.3 kg) in one week. This amount may be more or less depending on your condition.  You have trouble staying awake or you feel  confused.  You have chest pain.  You do not have an appetite.  You pass out. If you experience any of these symptoms:  Call your local emergency services (911 in the [place] right away or seek help at the emergency department of the nearest hospital. Yellow zone These signs and symptoms mean your condition may be getting worse and you should make some changes:  You have trouble breathing when you are active or you need to sleep with extra pillows.  You have swelling in your legs or abdomen.  You gain 2-3 lb (0.9-1.4 kg) in one day, or 5 lb (2.3 kg) in one week. This amount may be more or less depending on your condition.  You get tired easily.  You have trouble sleeping.  You have a dry cough. If you experience any of these symptoms:  Contact your health care provider within the next day.  Your health care provider may adjust your medicines. Green zone These signs mean you are doing well and can continue what you are doing:  You do not have shortness of breath.  You have very little swelling or no new swelling.  Your weight is stable (no gain or loss).  You have a normal activity level.  You do not have chest pain or any other new symptoms. Follow these instructions at home:  Take over-the-counter and prescription medicines only as told by your health care provider.  Weigh yourself daily. Your target weight is __________ lb (__________ kg). ? Call your health care provider if you gain more than __________ lb (__________ kg) in a day, or more than __________ lb (__________ kg) in one week.  Eat a heart-healthy diet. Work with a diet and nutrition specialist (dietitian) to create an eating plan that is best for you.  Keep all follow-up visits as told by your health care provider. This is important. Where to find more information  American Heart Association: www.heart.org Summary  Follow the action plan that was created by you and your health care provider.  Get  help right away if you have any symptoms in the Red zone. This information is not intended to replace advice given to you by your health care provider. Make sure you discuss any questions you have with your health care provider. Document Revised: 05/12/2017 Document Reviewed: 07/09/2016 Elsevier Patient Education  2020 Elsevier Inc.  

## 2020-04-10 NOTE — Progress Notes (Signed)
Subjective:  Patient ID: Bryan Guerrero, male    DOB: February 13, 1971  Age: 49 y.o. MRN: [id number]  CC: Hospitalization Follow-up (ESTABLISH CARE)   HPI Bryan Guerrero , 49 year old male, who is here to establish care status post hospitalization from 03/16/2020 through 03/26/2020 due to CHF exacerbation and new onset atrial flutter.  Patient reports that he is not having any current issues with shortness of breath.  He reports that he can lie down comfortably to sleep without any shortness of breath.  He has had no increased lower extremity swelling.  He reports that he is compliant with his medications.  He is taking the blood thinning medication and reports no unusual bruising or bleeding.  He does have a history of alcohol use and denies any use of alcohol since his recent hospitalization.  Overall he feels well at today's visit.  Past Medical History:  Diagnosis Date  . Alcohol abuse   . Asthma   . Cardiomyopathy (HCC)   . Dyspnea   . Hypertension     Past Surgical History:  Procedure Laterality Date  . CARDIOVERSION N/A 03/17/2020   Procedure: CARDIOVERSION;  Surgeon: Lewayne Bunting, MD;  Location: [facility] ENDOSCOPY;  Service: Cardiovascular;  Laterality: N/A;  . RIGHT/LEFT HEART CATH AND CORONARY ANGIOGRAPHY N/A 03/19/2020   Procedure: RIGHT/LEFT HEART CATH AND CORONARY ANGIOGRAPHY;  Surgeon: Laurey Morale, MD;  Location: [facility] INVASIVE CV LAB;  Service: Cardiovascular;  Laterality: N/A;  . TEE WITHOUT CARDIOVERSION  10/052021  . TEE WITHOUT CARDIOVERSION N/A 03/17/2020   Procedure: TRANSESOPHAGEAL ECHOCARDIOGRAM (TEE);  Surgeon: Lewayne Bunting, MD;  Location: [facility] ENDOSCOPY;  Service: Cardiovascular;  Laterality: N/A;    Family History  Problem Relation Age of Onset  . Hypertension Father     Social History   Tobacco Use  . Smoking status: Former Smoker    Years: 15.00    Types: Cigars    Quit date: 07/14/2018    Years since quitting: 1.7  . Smokeless tobacco: Never Used    Substance Use Topics  . Alcohol use: Yes    Alcohol/week: 51.0 standard drinks    Types: 1 Cans of beer, 50 Shots of liquor per week    ROS Review of Systems  Constitutional: Negative for chills, fatigue and unexpected weight change.  HENT: Negative for sore throat and trouble swallowing.   Respiratory: Negative for cough and shortness of breath.   Cardiovascular: Negative for chest pain, palpitations and leg swelling.  Gastrointestinal: Negative for abdominal pain, blood in stool, constipation, diarrhea and nausea.  Endocrine: Negative for cold intolerance, heat intolerance, polydipsia, polyphagia and polyuria.  Genitourinary: Negative for dysuria and frequency.  Musculoskeletal: Negative for arthralgias and back pain.  Skin: Negative for rash and wound.  Neurological: Negative for dizziness and headaches.  Hematological: Negative for adenopathy. Does not bruise/bleed easily.  Psychiatric/Behavioral: Negative for suicidal ideas. The patient is not nervous/anxious.     Objective:   Today's Vitals: BP 125/86 (BP Location: Right Arm, Patient Position: Sitting)   Pulse 75   Temp (!) 97 F (36.1 C)   Ht 6' 0.44" (1.84 m)   Wt 191 lb 3.2 oz (86.7 kg)   SpO2 98%   BMI 25.62 kg/m   Physical Exam Vitals and nursing note reviewed.  Constitutional:      General: He is not in acute distress.    Appearance: Normal appearance.  Neck:     Vascular: No carotid bruit.  Cardiovascular:     Rate  and Rhythm: Normal rate and regular rhythm.  Pulmonary:     Effort: Pulmonary effort is normal.     Breath sounds: Normal breath sounds.  Abdominal:     Palpations: Abdomen is soft.     Tenderness: There is no abdominal tenderness. There is no right CVA tenderness, left CVA tenderness, guarding or rebound.  Musculoskeletal:        General: No tenderness.     Cervical back: Normal range of motion and neck supple. No rigidity or tenderness.     Right lower leg: No edema.     Left lower leg:  No edema.  Lymphadenopathy:     Cervical: No cervical adenopathy.  Skin:    General: Skin is warm and dry.  Neurological:     General: No focal deficit present.     Mental Status: He is alert and oriented to person, place, and time.  Psychiatric:        Mood and Affect: Mood normal.        Behavior: Behavior normal.     Assessment & Plan:  1. Encounter to establish care; 4.  Hospital discharge follow-up 2. Chronic systolic congestive heart failure (HCC) 3. Atrial flutter, unspecified type [facility]) Patient's notes from his hospitalization from 03/16/2020 through 03/26/2020 reviewed in addition to his visit notes from 04/06/2020 heart failure clinic.  Patient reports that he is not having any current issues with shortness of breath or swelling in the lower legs.  His CHF appears to be stable at this time.  He denies any sensation of chest pain or palpitations.  He was found to have new onset atrial flutter during his recent hospitalization.  Patient had cardioversion done during his hospitalization.  Echocardiogram during hospitalization showed ejection fraction less than 20% and ejection fraction of 13% on cardiac MRI.  He reports that he is remaining compliant with Eliquis and has had no unusual bruising or bleeding.  CBC and basic metabolic panel from 04/06/2020 heart failure clinic note reviewed.  Patient appears to be stable at this time.  He denied the need for any medication refills.  Information on congestive heart failure given as part of after visit summary.  Outpatient Encounter Medications as of 04/10/2020  Medication Sig  . amiodarone (PACERONE) 200 MG tablet Take 1 tablet (200 mg total) by mouth daily.  Marland Kitchen apixaban (ELIQUIS) 5 MG TABS tablet Take 1 tablet (5 mg total) by mouth 2 (two) times daily.  . dapagliflozin propanediol (FARXIGA) 10 MG TABS tablet Take 1 tablet (10 mg total) by mouth daily.  . digoxin 62.5 MCG TABS Take 0.0625 mg by mouth daily.  . Multiple Vitamin  (MULTIVITAMIN WITH MINERALS) TABS tablet Take 1 tablet by mouth daily.  . sacubitril-valsartan (ENTRESTO) 49-51 MG Take 1 tablet by mouth 2 (two) times daily.  Marland Kitchen spironolactone (ALDACTONE) 25 MG tablet Take 1 tablet (25 mg total) by mouth daily.  Marland Kitchen EPINEPHrine (PRIMATENE MIST) 0.125 MG/ACT AERO Inhale 1 puff into the lungs 3 (three) times daily as needed (shortness od breath).   No facility-administered encounter medications on file as of 04/10/2020.      Follow-up: Return in about 3 months (around 07/11/2020) for chronic issues; sooner if needed; needs [facility] financial program appt/info.    Cain Saupe MD

## 2020-04-13 NOTE — Telephone Encounter (Signed)
Advanced Heart Failure Patient Advocate Encounter   Patient was approved to receive Farxiga from [employer]  Patient ID: [id number] Effective dates: 04/09/20 through 04/08/21  First shipment was sent out 10/29.

## 2020-04-13 NOTE — Telephone Encounter (Signed)
Spoke with patient regarding approvals of Sherryll Burger and [place]. [employer] informed him he would get his Entresto by Thursday.   He stated that [employer] called him about his Eliquis today and he was approved. Have not received an approval yet, will follow up to confirm. Will call the patient back if there is anything to add.

## 2020-04-14 ENCOUNTER — Ambulatory Visit (INDEPENDENT_AMBULATORY_CARE_PROVIDER_SITE_OTHER): Payer: Self-pay | Admitting: Cardiology

## 2020-04-14 ENCOUNTER — Encounter: Payer: Self-pay | Admitting: Cardiology

## 2020-04-14 ENCOUNTER — Other Ambulatory Visit: Payer: Self-pay

## 2020-04-14 VITALS — BP 102/88 | HR 82 | Ht 72.0 in | Wt 195.0 lb

## 2020-04-14 DIAGNOSIS — I5022 Chronic systolic (congestive) heart failure: Secondary | ICD-10-CM

## 2020-04-14 DIAGNOSIS — I10 Essential (primary) hypertension: Secondary | ICD-10-CM

## 2020-04-14 DIAGNOSIS — I4892 Unspecified atrial flutter: Secondary | ICD-10-CM

## 2020-04-14 DIAGNOSIS — F1010 Alcohol abuse, uncomplicated: Secondary | ICD-10-CM

## 2020-04-14 NOTE — Patient Instructions (Addendum)
Medication Instructions:  Your physician recommends that you continue on your current medications as directed. Please refer to the Current Medication list given to you today. *If you need a refill on your cardiac medications before your next appointment, please call your pharmacy*  Lab Work: None ordered.  If you have labs (blood work) drawn today and your tests are completely normal, you will receive your results only by: . MyChart Message (if you have MyChart) OR . A paper copy in the mail If you have any lab test that is abnormal or we need to change your treatment, we will call you to review the results.  Testing/Procedures: None ordered.  Follow-Up: At [facility], you and your health needs are our priority.  As part of our continuing mission to provide you with exceptional heart care, we have created designated Provider Care Teams.  These Care Teams include your primary Cardiologist (physician) and Advanced Practice Providers (APPs -  Physician Assistants and Nurse Practitioners) who all work together to provide you with the care you need, when you need it.  Your next appointment:   Your physician wants you to follow-up in: 6 months with Dr. Lambert.  You will receive a reminder letter in the mail two months in advance. If you don't receive a letter, please call our office to schedule the follow-up appointment.   

## 2020-04-14 NOTE — Progress Notes (Signed)
Electrophysiology Office Note:    Date:  04/14/2020   ID:  Bryan Guerrero, DOB 10/07/1970, MRN [id number]  PCP:  Cain Saupe, MD  [facility] Cardiologist:  Thurmon Fair, MD  [facility] Electrophysiologist:  None   Referring MD: Tana Felts*   Chief Complaint: Atrial flutter  History of Present Illness:    Bryan Guerrero is a 49 y.o. male who presents for an evaluation of atrial flutter at the request of Boyce Medici, NP. Their medical history includes alcohol abuse, asthma, hypertension, nonischemic cardiomyopathy.  Patient was recently admitted October 4 through March 26, 2020 for acute on chronic systolic heart failure.  He was admitted with dyspnea.  During the hospitalization new onset atrial flutter was noted.  He was started on amiodarone.  He also required milrinone.  Echo during the hospitalization showed an ejection fraction less than 20% with a severely dilated left ventricle.  There was severe mitral regurgitation.  He is referred to me today to discuss management options for his atrial flutter.  He tells me since leaving the hospital he has felt better with better energy level.  He walks 15 to 20 minutes twice daily and is done this without feeling short of breath, presyncopal.  No recurrent episodes of atrial flutter since leaving the hospital to his knowledge.  He reports adherence to his medical therapy.    Past Medical History:  Diagnosis Date  . Alcohol abuse   . Asthma   . Cardiomyopathy (HCC)   . Dyspnea   . Hypertension     Past Surgical History:  Procedure Laterality Date  . CARDIOVERSION N/A 03/17/2020   Procedure: CARDIOVERSION;  Surgeon: Lewayne Bunting, MD;  Location: [facility] ENDOSCOPY;  Service: Cardiovascular;  Laterality: N/A;  . RIGHT/LEFT HEART CATH AND CORONARY ANGIOGRAPHY N/A 03/19/2020   Procedure: RIGHT/LEFT HEART CATH AND CORONARY ANGIOGRAPHY;  Surgeon: Laurey Morale, MD;  Location: [facility] INVASIVE CV LAB;  Service:  Cardiovascular;  Laterality: N/A;  . TEE WITHOUT CARDIOVERSION  10/052021  . TEE WITHOUT CARDIOVERSION N/A 03/17/2020   Procedure: TRANSESOPHAGEAL ECHOCARDIOGRAM (TEE);  Surgeon: Lewayne Bunting, MD;  Location: [facility] ENDOSCOPY;  Service: Cardiovascular;  Laterality: N/A;    Current Medications: Current Meds  Medication Sig  . amiodarone (PACERONE) 200 MG tablet Take 1 tablet (200 mg total) by mouth daily.  Marland Kitchen apixaban (ELIQUIS) 5 MG TABS tablet Take 1 tablet (5 mg total) by mouth 2 (two) times daily.  . dapagliflozin propanediol (FARXIGA) 10 MG TABS tablet Take 1 tablet (10 mg total) by mouth daily.  . digoxin 62.5 MCG TABS Take 0.0625 mg by mouth daily.  Marland Kitchen EPINEPHrine (PRIMATENE MIST) 0.125 MG/ACT AERO Inhale 1 puff into the lungs 3 (three) times daily as needed (shortness od breath).  . Multiple Vitamin (MULTIVITAMIN WITH MINERALS) TABS tablet Take 1 tablet by mouth daily.  . sacubitril-valsartan (ENTRESTO) 49-51 MG Take 1 tablet by mouth 2 (two) times daily.  Marland Kitchen spironolactone (ALDACTONE) 25 MG tablet Take 1 tablet (25 mg total) by mouth daily.     Allergies:   Patient has no known allergies.   Social History   Socioeconomic History  . Marital status: Single    Spouse name: Not on file  . Number of children: Not on file  . Years of education: Not on file  . Highest education level: Not on file  Occupational History  . Not on file  Tobacco Use  . Smoking status: Former Smoker    Years: 15.00  Types: Cigars    Quit date: 07/14/2018    Years since quitting: 1.7  . Smokeless tobacco: Never Used  Vaping Use  . Vaping Use: Never used  Substance and Sexual Activity  . Alcohol use: Not Currently    Comment: quit 03/09/20  . Drug use: Not Currently    Types: Marijuana    Comment: quit 03/09/20  . Sexual activity: Not Currently  Other Topics Concern  . Not on file  Social History Narrative  . Not on file   Social Determinants of Health   Financial Resource Strain:   .  Difficulty of Paying Living Expenses: Not on file  Food Insecurity: No Food Insecurity  . Worried About Programme researcher, broadcasting/film/video in the Last Year: Never true  . Ran Out of Food in the Last Year: Never true  Transportation Needs: No Transportation Needs  . Lack of Transportation (Medical): No  . Lack of Transportation (Non-Medical): No  Physical Activity:   . Days of Exercise per Week: Not on file  . Minutes of Exercise per Session: Not on file  Stress:   . Feeling of Stress : Not on file  Social Connections:   . Frequency of Communication with Friends and Family: Not on file  . Frequency of Social Gatherings with Friends and Family: Not on file  . Attends Religious Services: Not on file  . Active Member of Clubs or Organizations: Not on file  . Attends Banker Meetings: Not on file  . Marital Status: Not on file     Family History: The patient's family history includes Hypertension in his father. There is no history of Cancer or Diabetes.  ROS:   Please see the history of present illness.    All other systems reviewed and are negative.  EKGs/Labs/Other Studies Reviewed:    The following studies were reviewed today: Hospital records  EKG:  The ekg ordered today demonstrates sinus rhythm  EKG on March 16, 2020 shows typical atrial flutter with rapid ventricular rates (149 bpm).  Frequent PVCs.   Recent Labs: 03/16/2020: B Natriuretic Peptide 1,394.3; TSH 2.391 03/21/2020: Magnesium 1.9 03/26/2020: ALT 52 04/06/2020: BUN 12; Creatinine, Ser 1.24; Hemoglobin 13.0; Platelets 298; Potassium 4.4; Sodium 139  Recent Lipid Panel No results found for: CHOL, TRIG, HDL, CHOLHDL, VLDL, LDLCALC, LDLDIRECT  Physical Exam:    VS:  BP 102/88   Pulse 82   Ht 6' (1.829 m)   Wt 195 lb (88.5 kg)   SpO2 99%   BMI 26.45 kg/m     Wt Readings from Last 3 Encounters:  04/14/20 195 lb (88.5 kg)  04/10/20 191 lb 3.2 oz (86.7 kg)  04/06/20 192 lb 9.6 oz (87.4 kg)     GEN:   Well nourished, well developed in no acute distress HEENT: Normal NECK: No JVD; No carotid bruits LYMPHATICS: No lymphadenopathy CARDIAC: RRR, no murmurs, rubs, gallops RESPIRATORY:  Clear to auscultation without rales, wheezing or rhonchi  ABDOMEN: Soft, non-tender, non-distended MUSCULOSKELETAL:  No edema; No deformity  SKIN: Warm and dry NEUROLOGIC:  Alert and oriented x 3 PSYCHIATRIC:  Normal affect   ASSESSMENT:    1. Chronic systolic congestive heart failure (HCC)   2. Atrial flutter, unspecified type (HCC)   3. Alcohol abuse   4. Primary hypertension    PLAN:    In order of problems listed above:  1.  Typical atrial flutter In the setting of acute on chronic systolic heart failure.  Cardioverted during his hospitalization  and has maintained normal rhythm on amiodarone 200 mg once daily.  Tolerates his medicine without off target effects.  Also on Eliquis and is tolerating well.  Plan to continue current regimen of amiodarone and Eliquis and see back in 6 months.  Ideally, would like to get him off amiodarone to avoid cumulative off target effects.  We will get blood work including a CMP and TSH at the 37-month appointment.  If no recurrent episodes of atrial flutter and if his ejection fraction has improved, will consider discontinuing the amiodarone.  2.  Acute on chronic systolic heart failure secondary to a nonischemic cardiomyopathy Chronic alcohol abuse is contributing NYHA II symptoms Continue digoxin, Entresto, spironolactone If patient continues to tolerate optimal medical therapy, will need to repeat an echocardiogram to reassess LV function.  This will guide future discussions regarding defibrillator therapy.  3.  Alcohol abuse Contributing to #2  4.  Hypertension Controlled at today's visit.  Continue current regimen and repeat echo before next visit.  Follow-up 6 months.   Medication Adjustments/Labs and Tests Ordered: Current medicines are reviewed at  length with the patient today.  Concerns regarding medicines are outlined above.  Orders Placed This Encounter  Procedures  . EKG 12-Lead   No orders of the defined types were placed in this encounter.    Signed, Steffanie Dunn, MD, [facility]  04/14/2020 4:24 PM    Electrophysiology [facility]

## 2020-04-14 NOTE — Telephone Encounter (Signed)
Advanced Heart Failure Patient Advocate Encounter   Patient was approved to receive Eliquis from [employer]  Patient ID: [id number] Effective dates: 04/10/20 through 04/09/20

## 2020-04-16 ENCOUNTER — Ambulatory Visit (HOSPITAL_COMMUNITY)
Admission: RE | Admit: 2020-04-16 | Discharge: 2020-04-16 | Disposition: A | Discharge status: Home / Self Care | Payer: Self-pay | Source: Ambulatory Visit | Attending: Cardiology | Admitting: Cardiology

## 2020-04-16 ENCOUNTER — Other Ambulatory Visit: Payer: Self-pay

## 2020-04-16 DIAGNOSIS — I5022 Chronic systolic (congestive) heart failure: Secondary | ICD-10-CM | POA: Insufficient documentation

## 2020-04-16 LAB — BASIC METABOLIC PANEL
Anion gap: 9 (ref 5–15)
BUN: 13 mg/dL (ref 6–20)
CO2: 25 mmol/L (ref 22–32)
Calcium: 9.8 mg/dL (ref 8.9–10.3)
Chloride: 105 mmol/L (ref 98–111)
Creatinine, Ser: 1.18 mg/dL (ref 0.61–1.24)
GFR, Estimated: >60 mL/min (ref >60)
Glucose, Bld: 109 mg/dL — ABNORMAL HIGH (ref 70–99)
Potassium: 4.3 mmol/L (ref 3.5–5.1)
Sodium: 139 mmol/L (ref 135–145)

## 2020-04-16 LAB — DIGOXIN LEVEL: Digoxin Level: 0.5 ng/mL — ABNORMAL LOW (ref 1.0–2.0)

## 2020-04-23 ENCOUNTER — Other Ambulatory Visit (HOSPITAL_COMMUNITY): Payer: Self-pay | Admitting: *Deleted

## 2020-04-23 MED ORDER — SPIRONOLACTONE 25 MG PO TABS
25.0000 mg | ORAL_TABLET | Freq: Every day | ORAL | 0 refills | Status: DC
Start: 2020-04-23 — End: 2020-05-22

## 2020-04-28 ENCOUNTER — Ambulatory Visit (HOSPITAL_COMMUNITY)
Admission: RE | Admit: 2020-04-28 | Discharge: 2020-04-28 | Disposition: A | Discharge status: Home / Self Care | Payer: Self-pay | Source: Ambulatory Visit | Attending: Internal Medicine | Admitting: Internal Medicine

## 2020-04-28 ENCOUNTER — Telehealth (HOSPITAL_COMMUNITY): Payer: Self-pay | Admitting: Licensed Clinical Social Worker

## 2020-04-28 ENCOUNTER — Other Ambulatory Visit: Payer: Self-pay

## 2020-04-28 VITALS — BP 134/82 | HR 73 | Wt 198.2 lb

## 2020-04-28 DIAGNOSIS — I5022 Chronic systolic (congestive) heart failure: Secondary | ICD-10-CM | POA: Insufficient documentation

## 2020-04-28 DIAGNOSIS — I130 Hypertensive heart and chronic kidney disease with heart failure and stage 1 through stage 4 chronic kidney disease, or unspecified chronic kidney disease: Secondary | ICD-10-CM | POA: Insufficient documentation

## 2020-04-28 DIAGNOSIS — I340 Nonrheumatic mitral (valve) insufficiency: Secondary | ICD-10-CM | POA: Insufficient documentation

## 2020-04-28 DIAGNOSIS — Z79899 Other long term (current) drug therapy: Secondary | ICD-10-CM | POA: Insufficient documentation

## 2020-04-28 DIAGNOSIS — F1010 Alcohol abuse, uncomplicated: Secondary | ICD-10-CM | POA: Insufficient documentation

## 2020-04-28 DIAGNOSIS — F17200 Nicotine dependence, unspecified, uncomplicated: Secondary | ICD-10-CM | POA: Insufficient documentation

## 2020-04-28 DIAGNOSIS — N183 Chronic kidney disease, stage 3 unspecified: Secondary | ICD-10-CM | POA: Insufficient documentation

## 2020-04-28 DIAGNOSIS — I4892 Unspecified atrial flutter: Secondary | ICD-10-CM | POA: Insufficient documentation

## 2020-04-28 MED ORDER — SACUBITRIL-VALSARTAN 97-103 MG PO TABS: 1.0000 | ORAL_TABLET | Freq: Two times a day (BID) | ORAL | 11 refills | Status: DC

## 2020-04-28 NOTE — Telephone Encounter (Signed)
CSW received call from pt to inquire about what services we provide.  Pt is concerned about paying for rent/utilities since he has been informed he is not safe to return to work at this time.  Pt reports no current outstanding bills but that he is concerned for the future.  CSW explained that we could definitely assist with connecting to community resources or potentially helping pt directly but that pt would need to call back when he and his wife are unable to pay for them so we could move forward with finding assistance at that time.  Pt also asked about disability.  CSW explained that pt would need to be believed to be unable to work for 44months or longer in order to qualify- at this time pt is still hopeful to go back to work.  Explained how to apply but reiterated that it takes about 6-9 months to get a determination and pt goal is to go back to work then it might not be worth the effort.  Stated that he should speak with MD during next visit about expected time out of work so he can get a better sense if this is necessary.  CSW will continue to follow and assist as needed  Burna Sis, LCSW Clinical Social Worker [facility] Desk#: [phone] Cell#: [phone]

## 2020-04-28 NOTE — Progress Notes (Signed)
Referring Physician: PCP: Reece Leader, DO PCP-Cardiologist: Thurmon Fair, MD  AHF: Dr. Shirlee Latch   HPI:  Mr. Bryan Guerrero is a 49 year old with a history of chronic systolic heart failure dating back to 2020, ETOH abuse, HTN, and tobacco abuse.  He was admitted in February 2020 with increased shortness of breath. At that time he was drinking 1/5 of liquor per day. ECHO was completed and showed severely reduced EF 15-20%. 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 Did not have a formal cath. He was discharged 08/08/19. He returned for one appointment with cardiology but did not return for f/u after that.   He was recently admitted 10/21 for a/c systolic heart failure and atrial flutter w/ RVR requiring DCCV. He also had low output requiring milrinone. R/LHC showed mildly elevated PCWP, normal RA pressure, normal cardiac output on milrinone and no significant coronary disease. Echo showed severe LVEF <20%, severe RV dysfunction and severe MR. He was diuresed w/ IV furosemide and was able to wean off milrinone. Discharge weight was 186 lbs.   He recently presented to clinic on 04/06/20 for f/u with Robbie Lis, PA. He was doing well and had no significant complaints. NYHA Class II symptoms. He reported full compliance with mediations and was tolerating well with no side effects. Weight was 192 lbs which was up 6 lbs since discharge. He reported cutting back significantly on ETOH.   Today he returns to HF clinic for pharmacist medication titration. At last visit with [facility], Sherryll Burger was increased to 49/51 mg BID and amiodarone was decreased to 200 mg daily. Digoxin level was also elevated, so digoxin was held for 3 days and decreased to 0.0625 mg daily. He is feeling good today overall. He reports some dizziness when he stands up too quickly but this is infrequent. Denies fatigue, chest pain, or palpitations. He does not have any problems with his breathing and is able to walk 10-20 minutes before  feeling SOB. Weight is up 6 lbs since last visit. No LEE, orthopnea, or PND. His appetite has increased but he follows a low-sat diet. He is wondering when he can go back to work.  HF Medications: Entresto 49/51 mg BID Spironolactone 25 mg daily Farxiga 10 mg daily Digoxin 0.0625 mg daily  Has the patient been experiencing any side effects to the medications prescribed?  no  Does the patient have any problems obtaining medications due to transportation or finances?   No prescription insurance - Receives Eliquis from [employer], [place] from [employer], and Sherryll Burger from [employer].  Understanding of regimen: good Understanding of indications: good Potential of compliance: good Patient understands to avoid NSAIDs. Patient understands to avoid decongestants.    Pertinent Lab Values 04/16/20: . Serum creatinine 1.18, BUN 13, Potassium 4.3, Sodium 139, Digoxin 0.5 ng/mL  Vital Signs: . Weight: 198.2 lbs (last clinic weight: 192 lbs) . Blood pressure: 134/82  . Heart rate: 73   Assessment: 1. Chronic Systolic CHF: recent admit 10/21 for a/c CHF w/ low output requiring milrinone. Biventricular failure. ECHO on 10/21 with EF < 20%, severe RV dysfunction, severe MR. LHC showed no significant coronary disease. CO improved on milrinone and was able to wean off. cMRI c/w ETOH CM  - NYHA Class II symptoms. Volume up 6 lbs since last visit but appears euvolemic, not on diuretic. - Increase Entresto to 97/103 mg BID. Repeat BMET in 3-4 weeks. - Continue spironolactone 25 mg daily - Continue Farxiga 10 mg daily - Continue digoxin 0.0625 mg daily - Needs  complete abstinence from ETOH. Previously discussed in detail. - Previously deemed not a candidate for advanced therapies unless he quits drinkingand is compliant with followup.    2. Atrial flutter: s/p DCCV 10/21. Suspect recurrence risk is high. Maintaining NSR on amiodarone by recent EKG   - Continue amiodarone 200 once daily  - Continue Eliquis. No  bleeding. Check CBC today  - Consider ablation in the future. Previously referred to EP   3. Stage 3 CKD: recent SCr during hospitalization spiked to 2.9, in setting of low output/ cardiorenal. Improved w/ inotropes and diuresis, down to 1.4 day of discharge  5. ETOH abuse: Strongly urged cessation.   6. Smoking: Urged cessation.  7. MR: severe on echo, likely functional - Denies dyspnea/ CP    Plan: 1) Medication changes: Based on clinical presentation, vital signs and recent labs will increase Entresto to 97/103 mg BID 2) Labs: BMET at next follow-up visit 3) Follow-up: 4 week follow-up with [facility]  Tama Headings, PharmD PGY2 Cardiology Pharmacy Resident  Karle Plumber, PharmD, BCPS, [facility], CPP Heart Failure Clinic Pharmacist [phone]

## 2020-04-28 NOTE — Patient Instructions (Signed)
It was a pleasure seeing you today!  MEDICATIONS: -We are changing your medications today -Increase Entresto to 97/103 mg (1 tablet) twice daily. -Call if you have questions about your medications.   NEXT APPOINTMENT: Return to clinic in 4 weeks with [facility].  In general, to take care of your heart failure: -Limit your fluid intake to 2 Liters (half-gallon) per day.   -Limit your salt intake to ideally 2-3 grams (2000-3000 mg) per day. -Weigh yourself daily and record, and bring that "weight diary" to your next appointment.  (Weight gain of 2-3 pounds in 1 day typically means fluid weight.) -The medications for your heart are to help your heart and help you live longer.   -Please contact us before stopping any of your heart medications.  Call the clinic at [phone] with questions or to reschedule future appointments.

## 2020-05-04 ENCOUNTER — Telehealth (HOSPITAL_COMMUNITY): Payer: Self-pay | Admitting: *Deleted

## 2020-05-04 ENCOUNTER — Encounter (HOSPITAL_COMMUNITY): Payer: Self-pay | Admitting: *Deleted

## 2020-05-04 NOTE — Telephone Encounter (Signed)
Received "Doctor's Release Form" from pt to completed. Per chart looks like pt is out of work currently and has not been cleared to return.   I called and discussed w/pt, he states he is out of work and wants to know when he can return. Advised per chart looks like he should be out until his next appt 12/13 and it can be discussed further at that time.  Pt agreeable with this plan and request a letter stating he is to remain out of work until the appt. Letter completed and left for pt to p/u at the front desk, advised pt I would hold on to his release form until his appt.

## 2020-05-05 ENCOUNTER — Ambulatory Visit: Payer: Self-pay | Attending: Internal Medicine

## 2020-05-05 DIAGNOSIS — Z23 Encounter for immunization: Secondary | ICD-10-CM

## 2020-05-05 NOTE — Progress Notes (Signed)
° °  Covid-19 Vaccination Clinic  Name:  Bryan Guerrero    MRN: [id number] DOB: 22-Nov-1970  05/05/2020  Mr. Bryan Guerrero was observed post Covid-19 immunization for 15 minutes without incident. He was provided with Vaccine Information Sheet and instruction to access the V-Safe system.   Mr. Bryan Guerrero was instructed to call 911 with any severe reactions post vaccine:  Difficulty breathing   Swelling of face and throat   A fast heartbeat   A bad rash all over body   Dizziness and weakness   Immunizations Administered    Name Date Dose VIS Date Route   Pfizer COVID-19 Vaccine 05/05/2020  2:58 PM 0.3 mL 04/01/2020 Intramuscular   Manufacturer: [employer], [employer]   Lot: [id number]   NDC: [address]-0

## 2020-05-13 ENCOUNTER — Telehealth (HOSPITAL_COMMUNITY): Payer: Self-pay | Admitting: Pharmacist

## 2020-05-13 MED ORDER — DIGOXIN 62.5 MCG PO TABS
0.0625 mg | ORAL_TABLET | Freq: Every day | ORAL | 3 refills | Status: DC
Start: 2020-05-13 — End: 2020-05-19

## 2020-05-13 NOTE — Telephone Encounter (Signed)
Digoxin refill sent to [facility].

## 2020-05-14 ENCOUNTER — Other Ambulatory Visit (HOSPITAL_COMMUNITY): Payer: Self-pay | Admitting: *Deleted

## 2020-05-18 ENCOUNTER — Other Ambulatory Visit (HOSPITAL_COMMUNITY): Payer: Self-pay | Admitting: *Deleted

## 2020-05-19 ENCOUNTER — Other Ambulatory Visit (HOSPITAL_COMMUNITY): Payer: Self-pay | Admitting: *Deleted

## 2020-05-19 MED ORDER — DIGOXIN 125 MCG PO TABS: 0.0625 mg | ORAL_TABLET | Freq: Every day | ORAL | 3 refills | Status: DC

## 2020-05-22 ENCOUNTER — Other Ambulatory Visit (HOSPITAL_COMMUNITY): Payer: Self-pay

## 2020-05-22 MED ORDER — SPIRONOLACTONE 25 MG PO TABS
25.0000 mg | ORAL_TABLET | Freq: Every day | ORAL | 3 refills | Status: DC
Start: 2020-05-22 — End: 2021-04-09

## 2020-05-22 NOTE — Telephone Encounter (Signed)
Patient called requesting a refill of his spironolactone. Refill sent to pharmacy on file.  Meds ordered this encounter  Medications  . spironolactone (ALDACTONE) 25 MG tablet    Sig: Take 1 tablet (25 mg total) by mouth daily.    Dispense:  90 tablet    Refill:  3

## 2020-05-24 NOTE — Progress Notes (Signed)
[facility] Note   Referring Physician: PCP: Cain Saupe, MD PCP-Cardiologist: Thurmon Fair, MD  AHF: Dr. Shirlee Latch   Reason for Visit: Heart Failure   HPI: Mr Geer is a 49 year old with history of chronic systolic heart failure dating back to 2020, ETOH abuse, HTN, and tobacco abuse.    Admitted back in February 2020 with increased shortness of breath. At that time he was drinking 1/5 of licquor per day. ECHO completed and showed severely reduced EF 15-20%. 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 No formal cath. He was discharged 08/08/19. He returned for one appointment with cardiology but no return f/u after that.   Recently admitted 10/21 for a/c systolic heart failure + atrial flutter w/ RVR requiring DCCV. Also w/ low output requiring milrinone. R/LHC showed mildly elevated PCWP, normal RA pressure, normal cardiac output on milrinone and no significant coronary disease. Echo showed severe LVEF <20%, severe RV dysfunction and severe MR. He was diuresed w/ IV Lasix. Was able to wean off milrinone. Discharge wt was 186 lb.   Today he returns for HF follow up.Overall feeling fine. Denies SOB/PND/Orthopnea. Appetite ok. No fever or chills. Not weighing at home. Taking all medications. Currenlty not working (works at [employer]) Rarely drinking alcohol. Stopped smoking. Lives with wife and child.   ECHO 03/17/2020 EF 20%   CMRI- 03/19/2020   1.  Severe LV dilatation with severe systolic dysfunction (EF 13%) 2.  Moderate RV dilatation with severe systolic dysfunction (EF 20%) 3. 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 Basal septal midwall LGE, which is a scar pattern seen in nonischemic cardiomyopathies and associated with worse prognosis 5. RV insertion site LGE, which is a nonspecific finding often seen in setting of elevated  pulmonary pressures 6.  Moderate mitral regurgitation (regurgitant fraction  EF ~20% Suggestive of ETOH induced cardiomyopathy.   LHC 03/2020 - No coronary disease   Review of systems complete and found to be negative unless listed in HPI.      Past Medical History:  Diagnosis Date  . Alcohol abuse   . Asthma   . Cardiomyopathy (HCC)   . Dyspnea   . Hypertension     Current Outpatient Medications  Medication Sig Dispense Refill  . amiodarone (PACERONE) 200 MG tablet Take 1 tablet (200 mg total) by mouth daily. 30 tablet 3  . apixaban (ELIQUIS) 5 MG TABS tablet Take 1 tablet (5 mg total) by mouth 2 (two) times daily. 60 tablet 0  . dapagliflozin propanediol (FARXIGA) 10 MG TABS tablet Take 1 tablet (10 mg total) by mouth daily. 30 tablet 0  . EPINEPHrine 0.125 MG/ACT AERO Inhale 1 puff into the lungs 3 (three) times daily as needed (shortness od breath).    . Multiple Vitamin (MULTIVITAMIN WITH MINERALS) TABS tablet Take 1 tablet by mouth daily.    . sacubitril-valsartan (ENTRESTO) 97-103 MG Take 1 tablet by mouth 2 (two) times daily. 60 tablet 11  . spironolactone (ALDACTONE) 25 MG tablet Take 1 tablet (25 mg total) by mouth daily. 90 tablet 3  . digoxin (LANOXIN) 0.125 MG tablet Take 0.125 mg by mouth daily.     No current facility-administered medications for this encounter.    No Known Allergies    Social History   Socioeconomic History  . Marital status: Single    Spouse name: Not on file  . Number of children: Not on file  .  Years of education: Not on file  . Highest education level: Not on file  Occupational History  . Not on file  Tobacco Use  . Smoking status: Former Smoker    Years: 15.00    Types: Cigars    Quit date: 07/14/2018    Years since quitting: 1.8  . Smokeless tobacco: Never Used  Vaping Use  . Vaping Use: Never used  Substance and Sexual Activity  . Alcohol use: Not Currently    Comment: quit 03/09/20  . Drug use: Not Currently    Types:  Marijuana    Comment: quit 03/09/20  . Sexual activity: Not Currently  Other Topics Concern  . Not on file  Social History Narrative  . Not on file   Social Determinants of Health   Financial Resource Strain: Not on file  Food Insecurity: No Food Insecurity  . Worried About Programme researcher, broadcasting/film/video in the Last Year: Never true  . Ran Out of Food in the Last Year: Never true  Transportation Needs: No Transportation Needs  . Lack of Transportation (Medical): No  . Lack of Transportation (Non-Medical): No  Physical Activity: Not on file  Stress: Not on file  Social Connections: Not on file  Intimate Partner Violence: Not on file      Family History  Problem Relation Age of Onset  . Hypertension Father   . Cancer Neg Hx   . Diabetes Neg Hx     Vitals:   05/25/20 0855  BP: 118/83  Pulse: 82  SpO2: 100%  Weight: 91.1 kg (200 lb 12.8 oz)     PHYSICAL EXAM: General:  Well appearing. No respiratory difficulty HEENT: normal Neck: supple. no JVD. Carotids 2+ bilat; no bruits. No lymphadenopathy or thyromegaly appreciated. Cor: PMI nondisplaced. Regular rate & rhythm. No rubs, gallops or murmurs. Lungs: clear Abdomen: soft, nontender, nondistended. No hepatosplenomegaly. No bruits or masses. Good bowel sounds. Extremities: no cyanosis, clubbing, rash, edema Neuro: alert & oriented x 3, cranial nerves grossly intact. moves all 4 extremities w/o difficulty. Affect pleasant.  ECG: NSR 76 bpm occasional PVCs.    ASSESSMENT & PLAN:  1. Chronic Systolic CHF: recent admit 10/21 for a/c CHF w/ low output requiring milrinone. Biventricular failure. Echo 10/21 EF < 20%, severe RV dysfunction, severe MR. LHC showed no significant coronary disease. CO good on milrinone, was able to wean off. cMRI c/w ETOH CM  - NYHA I. Volume status stable.  - Continue digoxin 0.125.Check dig level today.  - Add carvedilol 3.125 mg twice a day.   -Continue spironolactone 25 daily  -Continue  Entresto  to 49-51 mg bid - Continue farxiga 10 mg daily  - Has stopped drinking.   - Not candidate for advanced therapies unless he quits drinkingand is compliant with followup.  - Check BMET and dig level.  - Repeat ECHO next visit.  2. Atrial flutter: s/p DCCV 10/21. Suspect recurrence risk is high.  In SR today. Cut back amio to 100 mg daily.  Patient is on amiodarone.  - Plan to follow amiodarone screening per guidelines  -Check TSH, Free T3 Free T4  - Will need yearly CXR, TSH Free T3 Free T4, LFTS, and eye exams.  - Discussed with patient. - Continue Eliquis.  - Consider ablation in the future.  3. Stage 3 CKD: recent SCr during hospitalization spiked to 2.9, in setting of low output/ cardiorenal. Improved w/ inotropes and diuresis, down to 1.4 day of discharge - Check BMET today  4.  ETOH abuse: Stopped drinking. Congratulated.  6. Smoking: No longer smoking. . 7. MR- severe on echo, likely functional -Repeat ECHO next visit.   Follow up 4-6 weeks with Dr Shirlee Latch and an ECHO. We discussed med changes and that if EF remains < 35% will refer to EP.   Work provided to return to work with rest breaks as needed.     Tonye Becket, NP 05/25/20

## 2020-05-25 ENCOUNTER — Telehealth (HOSPITAL_COMMUNITY): Payer: Self-pay | Admitting: *Deleted

## 2020-05-25 ENCOUNTER — Ambulatory Visit (HOSPITAL_COMMUNITY)
Admission: RE | Admit: 2020-05-25 | Discharge: 2020-05-25 | Disposition: A | Discharge status: Home / Self Care | Payer: Self-pay | Source: Ambulatory Visit | Attending: Adult Health | Admitting: Adult Health

## 2020-05-25 ENCOUNTER — Other Ambulatory Visit: Payer: Self-pay

## 2020-05-25 VITALS — BP 118/83 | HR 82 | Wt 200.8 lb

## 2020-05-25 DIAGNOSIS — Z7984 Long term (current) use of oral hypoglycemic drugs: Secondary | ICD-10-CM | POA: Insufficient documentation

## 2020-05-25 DIAGNOSIS — Z8249 Family history of ischemic heart disease and other diseases of the circulatory system: Secondary | ICD-10-CM | POA: Insufficient documentation

## 2020-05-25 DIAGNOSIS — I5022 Chronic systolic (congestive) heart failure: Secondary | ICD-10-CM | POA: Insufficient documentation

## 2020-05-25 DIAGNOSIS — I4892 Unspecified atrial flutter: Secondary | ICD-10-CM | POA: Insufficient documentation

## 2020-05-25 DIAGNOSIS — Z7901 Long term (current) use of anticoagulants: Secondary | ICD-10-CM | POA: Insufficient documentation

## 2020-05-25 DIAGNOSIS — I340 Nonrheumatic mitral (valve) insufficiency: Secondary | ICD-10-CM | POA: Insufficient documentation

## 2020-05-25 DIAGNOSIS — Z79899 Other long term (current) drug therapy: Secondary | ICD-10-CM | POA: Insufficient documentation

## 2020-05-25 DIAGNOSIS — Z720 Tobacco use: Secondary | ICD-10-CM

## 2020-05-25 DIAGNOSIS — I130 Hypertensive heart and chronic kidney disease with heart failure and stage 1 through stage 4 chronic kidney disease, or unspecified chronic kidney disease: Secondary | ICD-10-CM | POA: Insufficient documentation

## 2020-05-25 DIAGNOSIS — N183 Chronic kidney disease, stage 3 unspecified: Secondary | ICD-10-CM | POA: Insufficient documentation

## 2020-05-25 DIAGNOSIS — F1010 Alcohol abuse, uncomplicated: Secondary | ICD-10-CM | POA: Insufficient documentation

## 2020-05-25 DIAGNOSIS — Z87891 Personal history of nicotine dependence: Secondary | ICD-10-CM | POA: Insufficient documentation

## 2020-05-25 LAB — BASIC METABOLIC PANEL
Anion gap: 11 (ref 5–15)
BUN: 15 mg/dL (ref 6–20)
CO2: 23 mmol/L (ref 22–32)
Calcium: 9.6 mg/dL (ref 8.9–10.3)
Chloride: 101 mmol/L (ref 98–111)
Creatinine, Ser: 1.33 mg/dL — ABNORMAL HIGH (ref 0.61–1.24)
GFR, Estimated: >60 mL/min (ref >60)
Glucose, Bld: 109 mg/dL — ABNORMAL HIGH (ref 70–99)
Potassium: 4.2 mmol/L (ref 3.5–5.1)
Sodium: 135 mmol/L (ref 135–145)

## 2020-05-25 LAB — TSH: TSH: 2.088 u[IU]/mL (ref 0.350–4.500)

## 2020-05-25 LAB — DIGOXIN LEVEL: Digoxin Level: 1.3 ng/mL (ref 0.8–2.0)

## 2020-05-25 LAB — T4, FREE: Free T4: 1.11 ng/dL (ref 0.61–1.12)

## 2020-05-25 MED ORDER — AMIODARONE HCL 100 MG PO TABS: 100.0000 mg | ORAL_TABLET | Freq: Every day | ORAL | 6 refills | Status: DC

## 2020-05-25 MED ORDER — CARVEDILOL 3.125 MG PO TABS: 3.1250 mg | ORAL_TABLET | Freq: Two times a day (BID) | ORAL | 11 refills | Status: DC

## 2020-05-25 NOTE — Telephone Encounter (Signed)
-----   Message from Sherald Hess, NP sent at 05/25/2020  1:07 PM EST ----- Dig level high. Please call. Stop dig.  TSH ok Renal function stable.

## 2020-05-25 NOTE — Telephone Encounter (Signed)
Modesta Messing, [place]  05/25/2020 3:43 PM EST Back to Top     Pt returned call he is aware of results and verbalized understanding.    Samara Snide, RN  05/25/2020 1:45 PM EST      Left message to return call   Sherald Hess, NP  05/25/2020 1:07 PM EST      Dig level high. Please call. Stop dig.  TSH ok Renal function stable.

## 2020-05-25 NOTE — Patient Instructions (Signed)
START Coreg 3.125 mg, one tab twice a day DECREASE Amiodarone to 100 mg, one tab daily  Labs today We will only contact you if something comes back abnormal or we need to make some changes. Otherwise no news is good news!  Your physician recommends that you schedule a follow-up appointment in: 4-6 weeks with Dr  Shirlee Latch and echo  Your physician has requested that you have an echocardiogram. Echocardiography is a painless test that uses sound waves to create images of your heart. It provides your doctor with information about the size and shape of your heart and how well your hearts chambers and valves are working. This procedure takes approximately one hour. There are no restrictions for this procedure.   If you have any questions or concerns before your next appointment please send [place] a message through [place] or call our office at [phone].    TO LEAVE A MESSAGE FOR THE NURSE SELECT OPTION 2, PLEASE LEAVE A MESSAGE INCLUDING:  YOUR NAME  DATE OF BIRTH  CALL BACK NUMBER  REASON FOR CALL**this is important as we prioritize the call backs  YOU WILL RECEIVE A CALL BACK THE SAME DAY AS LONG AS YOU CALL BEFORE 4:00 PM .

## 2020-05-26 LAB — T3, FREE: T3, Free: 2.5 pg/mL (ref 2.0–4.4)

## 2020-05-29 ENCOUNTER — Encounter (HOSPITAL_COMMUNITY): Payer: Self-pay | Admitting: *Deleted

## 2020-05-29 NOTE — Progress Notes (Signed)
Pt's FMLA forms completed, signed by Dr Shirlee Latch and faxed to [facility] at [phone], copy mailed to him as well.

## 2020-07-10 ENCOUNTER — Other Ambulatory Visit: Payer: Self-pay

## 2020-07-10 ENCOUNTER — Ambulatory Visit (HOSPITAL_COMMUNITY)
Admission: RE | Admit: 2020-07-10 | Discharge: 2020-07-10 | Disposition: A | Discharge status: Home / Self Care | Payer: 59 | Source: Ambulatory Visit | Attending: Cardiology | Admitting: Cardiology

## 2020-07-10 ENCOUNTER — Encounter (HOSPITAL_COMMUNITY): Payer: Self-pay | Admitting: Cardiology

## 2020-07-10 ENCOUNTER — Ambulatory Visit (HOSPITAL_BASED_OUTPATIENT_CLINIC_OR_DEPARTMENT_OTHER)
Admission: RE | Admit: 2020-07-10 | Discharge: 2020-07-10 | Disposition: A | Discharge status: Home / Self Care | Payer: 59 | Source: Ambulatory Visit | Attending: Cardiology | Admitting: Cardiology

## 2020-07-10 VITALS — BP 132/80 | HR 71 | Wt 201.8 lb

## 2020-07-10 DIAGNOSIS — I5082 Biventricular heart failure: Secondary | ICD-10-CM | POA: Insufficient documentation

## 2020-07-10 DIAGNOSIS — F1011 Alcohol abuse, in remission: Secondary | ICD-10-CM | POA: Insufficient documentation

## 2020-07-10 DIAGNOSIS — Z79899 Other long term (current) drug therapy: Secondary | ICD-10-CM | POA: Diagnosis not present

## 2020-07-10 DIAGNOSIS — I130 Hypertensive heart and chronic kidney disease with heart failure and stage 1 through stage 4 chronic kidney disease, or unspecified chronic kidney disease: Secondary | ICD-10-CM | POA: Insufficient documentation

## 2020-07-10 DIAGNOSIS — R5383 Other fatigue: Secondary | ICD-10-CM

## 2020-07-10 DIAGNOSIS — I428 Other cardiomyopathies: Secondary | ICD-10-CM | POA: Insufficient documentation

## 2020-07-10 DIAGNOSIS — Z7901 Long term (current) use of anticoagulants: Secondary | ICD-10-CM | POA: Diagnosis not present

## 2020-07-10 DIAGNOSIS — I5023 Acute on chronic systolic (congestive) heart failure: Secondary | ICD-10-CM | POA: Insufficient documentation

## 2020-07-10 DIAGNOSIS — Z87891 Personal history of nicotine dependence: Secondary | ICD-10-CM | POA: Insufficient documentation

## 2020-07-10 DIAGNOSIS — I5022 Chronic systolic (congestive) heart failure: Secondary | ICD-10-CM

## 2020-07-10 DIAGNOSIS — N183 Chronic kidney disease, stage 3 unspecified: Secondary | ICD-10-CM | POA: Insufficient documentation

## 2020-07-10 DIAGNOSIS — R0683 Snoring: Secondary | ICD-10-CM | POA: Diagnosis not present

## 2020-07-10 DIAGNOSIS — I4892 Unspecified atrial flutter: Secondary | ICD-10-CM | POA: Diagnosis not present

## 2020-07-10 LAB — COMPREHENSIVE METABOLIC PANEL
ALT: 12 U/L (ref 0–44)
AST: 16 U/L (ref 15–41)
Albumin: 4 g/dL (ref 3.5–5.0)
Alkaline Phosphatase: 54 U/L (ref 38–126)
Anion gap: 7 (ref 5–15)
BUN: 8 mg/dL (ref 6–20)
CO2: 27 mmol/L (ref 22–32)
Calcium: 9.2 mg/dL (ref 8.9–10.3)
Chloride: 105 mmol/L (ref 98–111)
Creatinine, Ser: 1.18 mg/dL (ref 0.61–1.24)
GFR, Estimated: >60 mL/min (ref >60)
Glucose, Bld: 83 mg/dL (ref 70–99)
Potassium: 3.8 mmol/L (ref 3.5–5.1)
Sodium: 139 mmol/L (ref 135–145)
Total Bilirubin: 0.8 mg/dL (ref 0.3–1.2)
Total Protein: 6.9 g/dL (ref 6.5–8.1)

## 2020-07-10 LAB — CBC
HCT: 39.2 % (ref 39.0–52.0)
Hemoglobin: 12.7 g/dL — ABNORMAL LOW (ref 13.0–17.0)
MCH: 29.9 pg (ref 26.0–34.0)
MCHC: 32.4 g/dL (ref 30.0–36.0)
MCV: 92.2 fL (ref 80.0–100.0)
Platelets: 254 10*3/uL (ref 150–400)
RBC: 4.25 MIL/uL (ref 4.22–5.81)
RDW: 13.2 % (ref 11.5–15.5)
WBC: 4.7 10*3/uL (ref 4.0–10.5)
nRBC: 0 % (ref 0.0–0.2)

## 2020-07-10 LAB — ECHOCARDIOGRAM COMPLETE
Area-P 1/2: 2.22 cm2
Calc EF: 27.4 %
S' Lateral: 5.5 cm
Single Plane A2C EF: 33.1 %
Single Plane A4C EF: 23.3 %

## 2020-07-10 MED ORDER — CARVEDILOL 6.25 MG PO TABS: 6.2500 mg | ORAL_TABLET | Freq: Two times a day (BID) | ORAL | 11 refills | Status: DC

## 2020-07-10 NOTE — Patient Instructions (Addendum)
Labs done today. We will contact you only if your labs are abnormal.  INCREASE Carvedilol 6.25mg  (1 tablet) by mouth 2 times daily.  No other medication changes were made. Please continue all current medications as prescribed.  Your physician recommends that you schedule a follow-up appointment in: 3 months with Dr. Earlean Shawl have been referred to Cardiac Electrophysiologist at [facility]. That office will contact you to schedule an appointment.  You have been referred to [facility] for a sleep study. They will contact you to schedule an appointment.  If you have any questions or concerns before your next appointment please send [place] a message through [place] or call our office at [phone].    TO LEAVE A MESSAGE FOR THE NURSE SELECT OPTION 2, PLEASE LEAVE A MESSAGE INCLUDING: . YOUR NAME . DATE OF BIRTH . CALL BACK NUMBER . REASON FOR CALL**this is important as we prioritize the call backs  YOU WILL RECEIVE A CALL BACK THE SAME DAY AS LONG AS YOU CALL BEFORE 4:00 PM   Do the following things EVERYDAY: 1) Weigh yourself in the morning before breakfast. Write it down and keep it in a log. 2) Take your medicines as prescribed 3) Eat low salt foods--Limit salt (sodium) to 2000 mg per day.  4) Stay as active as you can everyday 5) Limit all fluids for the day to less than 2 liters   At the [facility], you and your health needs are our priority. As part of our continuing mission to provide you with exceptional heart care, we have created designated Provider Care Teams. These Care Teams include your primary Cardiologist (physician) and Advanced Practice Providers (APPs- Physician Assistants and Nurse Practitioners) who all work together to provide you with the care you need, when you need it.   You may see any of the following providers on your designated Care Team at your next follow up: Marland Kitchen Dr Arvilla Meres . Dr Marca Ancona . Tonye Becket, NP . Robbie Lis, PA . Karle Plumber, PharmD   Please be sure to bring in all your medications bottles to every appointment.

## 2020-07-10 NOTE — Progress Notes (Signed)
Echocardiogram 2D Echocardiogram has been performed.  Bryan Guerrero 07/10/2020, 3:05 PM

## 2020-07-12 NOTE — Progress Notes (Signed)
Advanced Heart Failure Clinic Note   Referring Physician: PCP: Cain Saupe, MD (Inactive) HF Cardiology: Dr. Shirlee Latch  Reason for Visit: Heart Failure   HPI: Mr Nigh is a 50 y.o. with history of chronic systolic heart failure dating back to 2020, ETOH abuse, HTN, and tobacco abuse.    Admitted back in February 2020 with increased shortness of breath. At that time he was drinking 1/5 of licquor per day. ECHO completed and showed severely reduced EF 15-20%. 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 No formal cath. He was discharged 08/08/19. He returned for one appointment with cardiology but no return f/u after that.   Admitted 10/21 for acute on chronic systolic heart failure + atrial flutter w/ RVR requiring DCCV. Also w/ low output requiring milrinone. R/LHC showed mildly elevated PCWP, normal RA pressure, normal cardiac output on milrinone and no significant coronary disease. Echo showed severe LVEF <20%, severe RV dysfunction and severe MR. He was diuresed w/ IV Lasix. Was able to wean off milrinone. Discharge wt was 186 lb.   Echo done today and reviewed showed EF 25% with global hypokinesis, normal RV, trivial MR.   Today he returns for HF followup. 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  No significant exertional dyspnea, does ok walking up 2 flights of stairs to his apartment.  No chest pain.  No lightheadedness.  No orthopnea/PND.  No BRBPR/melena. Rare ETOH use now.   ECG (personally reviewed): NSR, PVC, narrow QRS  Labs (12/21): TSH normal, K 4.2, creatinine 1.3  PMH: 1. HTN 2. ETOH abuse 3. Chronic systolic CHF: Nonischemic cardiomyopathy.   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  - LHC/RHC (10/21): No significant CAD; mean RA 4, PA 34/8, mean PCWP 18, CI 3.01.  - Echo (1/22):  EF 25% with global  hypokinesis, normal RV, trivial MR. 4. Atrial flutter: DCCV 10/21.  5. CKD stage 3  Review of systems complete and found to be negative unless listed in HPI.     Current Outpatient Medications  Medication Sig Dispense Refill  . amiodarone (PACERONE) 100 MG tablet Take 1 tablet (100 mg total) by mouth daily. 30 tablet 6  . apixaban (ELIQUIS) 5 MG TABS tablet Take 1 tablet (5 mg total) by mouth 2 (two) times daily. 60 tablet 0  . dapagliflozin propanediol (FARXIGA) 10 MG TABS tablet Take 1 tablet (10 mg total) by mouth daily. 30 tablet 0  . EPINEPHrine 0.125 MG/ACT AERO Inhale 1 puff into the lungs 3 (three) times daily as needed (shortness od breath).    . Multiple Vitamin (MULTIVITAMIN WITH MINERALS) TABS tablet Take 1 tablet by mouth daily.    . sacubitril-valsartan (ENTRESTO) 97-103 MG Take 1 tablet by mouth 2 (two) times daily. 60 tablet 11  . spironolactone (ALDACTONE) 25 MG tablet Take 1 tablet (25 mg total) by mouth daily. 90 tablet 3  . carvedilol (COREG) 6.25 MG tablet Take 1 tablet (6.25 mg total) by mouth 2 (two) times daily. 60 tablet 11   No current facility-administered medications for this encounter.    No Known Allergies    Social History   Socioeconomic History  . Marital status: Single    Spouse name: Not on file  . Number of children: Not on file  . Years of education: Not on file  . Highest education level: Not on file  Occupational History  . Not on file  Tobacco Use  . Smoking status: Former Smoker    Years: 15.00    Types: Cigars    Quit date: 07/14/2018    Years since quitting: 1.9  . Smokeless tobacco: Never Used  Vaping Use  . Vaping Use: Never used  Substance and Sexual Activity  . Alcohol use: Not Currently    Comment: quit 03/09/20  . Drug use: Not Currently    Types: Marijuana    Comment: quit 03/09/20  . Sexual activity: Not Currently  Other Topics Concern  . Not on file  Social History Narrative  . Not on file   Social Determinants of  Health   Financial Resource Strain: Not on file  Food Insecurity: No Food Insecurity  . Worried About Programme researcher, broadcasting/film/video in the Last Year: Never true  . Ran Out of Food in the Last Year: Never true  Transportation Needs: No Transportation Needs  . Lack of Transportation (Medical): No  . Lack of Transportation (Non-Medical): No  Physical Activity: Not on file  Stress: Not on file  Social Connections: Not on file  Intimate Partner Violence: Not on file      Family History  Problem Relation Age of Onset  . Hypertension Father   . Cancer Neg Hx   . Diabetes Neg Hx     Vitals:   07/10/20 1514  BP: 132/80  Pulse: 71  SpO2: 99%  Weight: 91.5 kg (201 lb 12.8 oz)     PHYSICAL EXAM: General: NAD Neck: No JVD, no thyromegaly or thyroid nodule.  Lungs: Clear to auscultation bilaterally with normal respiratory effort. CV: Nondisplaced PMI.  Heart regular S1/S2, no S3/S4, no murmur.  No peripheral edema.  No carotid bruit.  Normal pedal pulses.  Abdomen: Soft, nontender, no hepatosplenomegaly, no distention.  Skin: Intact without lesions or rashes.  Neurologic: Alert and oriented x 3.  Psych: Normal affect. Extremities: No clubbing or cyanosis.  HEENT: Normal.   ASSESSMENT & PLAN:  1. Chronic Systolic CHF: Nonischemic cardiomyopathy.  Admission 10/21 for acute systolic CHF w/ low output requiring milrinone. Biventricular failure, echo 10/21 EF < 20%, severe RV dysfunction, severe MR. LHC showed no significant coronary disease. CO good on milrinone, was able to wean off. 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  Patient has history of heavy ETOH, has now quit. Cause of cardiomyopathy likely noncompaction versus ETOH. Echo today with EF 25%, normal RV, trivial MR.  On exam, he is not volume overloaded.  NYHA class II symptoms.  - Increase Coreg to 6.25 mg bid.   -Continue spironolactone 25 daily. BMET today.  - Continue Entresto 97/103 mg  bid - Continue farxiga 10 mg daily  - Has stopped drinking ETOH.   - EF remains low, I will refer to EP for ICD.  Narrow QRS, not CRT candidate.  2. Atrial flutter: s/p DCCV 10/21. Suspect recurrence risk is high.  He is in NSR today.  - Continue amiodarone 100 mg daily. Check LFTs, TSH. Needs regular eye exam while on amiodarone.  - Continue Eliquis.  - To be referred to EP for ICD, would also like flutter ablation considered.  3. Stage 3 CKD: BMET today.  4.  ETOH abuse: Stopped drinking. Congratulated.  6. Smoking: No longer smoking.  7. Mitral regurgitation: Severe on 10/21 echo, echo today showed only trivial MR.   Followup in 3 months.   Marca Ancona, MD 07/12/20

## 2020-07-12 NOTE — Progress Notes (Unsigned)
   Subjective:    Patient ID: Bryan Guerrero, male    DOB: Apr 12, 1971, 50 y.o.   MRN: [id number]  50 y.o.M former PCP [facility] pt Hx CHF, ETOH, Aflutter, HTN,   07/13/20 Last seen 04/10/20 by [facility] Note below: Bryan Guerrero , 50 year old male, who is here to establish care status post hospitalization from 03/16/2020 through 03/26/2020 due to CHF exacerbation and new onset atrial flutter.  Patient reports that he is not having any current issues with shortness of breath.  He reports that he can lie down comfortably to sleep without any shortness of breath.  He has had no increased lower extremity swelling.  He reports that he is compliant with his medications.  He is taking the blood thinning medication and reports no unusual bruising or bleeding.  He does have a history of alcohol use and denies any use of alcohol since his recent hospitalization.  Overall he feels well at today's visit.  Encounter to establish care; 4.  Hospital discharge follow-up 2. Chronic systolic congestive heart failure (HCC) 3. Atrial flutter, unspecified type [facility]) Patient's notes from his hospitalization from 03/16/2020 through 03/26/2020 reviewed in addition to his visit notes from 04/06/2020 heart failure clinic.  Patient reports that he is not having any current issues with shortness of breath or swelling in the lower legs.  His CHF appears to be stable at this time.  He denies any sensation of chest pain or palpitations.  He was found to have new onset atrial flutter during his recent hospitalization.  Patient had cardioversion done during his hospitalization.  Echocardiogram during hospitalization showed ejection fraction less than 20% and ejection fraction of 13% on cardiac MRI.  He reports that he is remaining compliant with Eliquis and has had no unusual bruising or bleeding.  CBC and basic metabolic panel from 04/06/2020 heart failure clinic note reviewed.  Patient appears to be stable at this time.  He denied the need  for any medication refills.  Information on congestive heart failure given as part of after visit summary.  Needs colon , Tdap     Review of Systems     Objective:   Physical Exam        Assessment & Plan:

## 2020-07-13 ENCOUNTER — Other Ambulatory Visit: Payer: Self-pay

## 2020-07-13 ENCOUNTER — Ambulatory Visit: Payer: Self-pay | Admitting: Family Medicine

## 2020-07-13 ENCOUNTER — Ambulatory Visit: Payer: 59 | Attending: Critical Care Medicine | Admitting: Critical Care Medicine

## 2020-07-13 ENCOUNTER — Telehealth: Payer: Self-pay | Admitting: Family Medicine

## 2020-07-13 NOTE — Telephone Encounter (Signed)
Patient is calling because he missed NT call regarding his appt today. Patient is calling back. Herbalist. With no response. Held on the telephone with Office staff for 7 mins. Agent was placed on hold. And no one returned.Advised patient that someone with call him back. Please advise CB- [phone]

## 2020-07-13 NOTE — Telephone Encounter (Signed)
Called patient and rescheduled his virtual appt. For the end of February.

## 2020-08-09 NOTE — Progress Notes (Deleted)
Subjective:    Patient ID: Bryan Guerrero, male    DOB: October 07, 1970, 50 y.o.   MRN: [id number]  49 y.o.F.  Only saw [facility] once.03/2020  08/10/2020  Last saw [facility] 06/2019  Bryan Guerrero is a 50 y.o. with history of chronic systolic heart failure dating back to 2020, ETOH abuse, HTN, and tobacco abuse.  Admitted back in February 2020 with increased shortness of breath. At that time he was drinking 1/5 of licquor per day. ECHO completed and showed severely reduced EF 15-20%. 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 No formal cath. He was discharged 08/08/19. He returned for one appointment with cardiology but no return f/u after that.   Admitted 10/21 for acute on chronic systolic heart failure + atrial flutter w/ RVR requiring DCCV. Also w/ low output requiring milrinone. R/LHC showed mildly elevated PCWP, normal RA pressure, normal cardiac output on milrinone and no significant coronary disease. Echo showed severe LVEF <20%, severe RV dysfunction and severe Bryan. He was diuresed w/ IV Lasix. Was able to wean off milrinone. Discharge wt was 186 lb.   Echo done today and reviewed showed EF 25% with global hypokinesis, normal RV, trivial Bryan.   Today he returns for HF followup. 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  No significant exertional dyspnea, does ok walking up 2 flights of stairs to his apartment.  No chest pain.  No lightheadedness.  No orthopnea/PND.  No BRBPR/melena. Rare ETOH use now.   ECG (personally reviewed): NSR, PVC, narrow QRS  Labs (12/21): TSH normal, K 4.2, creatinine 1.3  Chronic Systolic CHF: Nonischemic cardiomyopathy.  Admission 10/21 for acute systolic CHF w/ low output requiring milrinone. Biventricular failure, echo 10/21 EF < 20%, severe RV dysfunction, severe Bryan. LHC showed no significant coronary disease. CO good on milrinone, was able to wean off. 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  Patient has history of  heavy ETOH, has now quit. Cause of cardiomyopathy likely noncompaction versus ETOH. Echo today with EF 25%, normal RV, trivial Bryan.  On exam, he is not volume overloaded.  NYHA class II symptoms.  - Increase Coreg to 6.25 mg bid.   -Continue spironolactone 25 daily. BMET today.  - Continue Entresto 97/103 mg bid - Continue farxiga 10 mg daily - Has stopped drinking ETOH.   - EF remains low, I will refer to EP for ICD.  Narrow QRS, not CRT candidate.  2. Atrial flutter: s/p DCCV 10/21. Suspect recurrence risk is high.  He is in NSR today.  - Continue amiodarone 100 mg daily. Check LFTs, TSH. Needs regular eye exam while on amiodarone.  - Continue Eliquis.  - To be referred to EP for ICD, would also like flutter ablation considered.  3. Stage 3 CKD: BMET today.  4.  ETOH abuse: Stopped drinking. Congratulated.  6. Smoking: No longer smoking.  7. Mitral regurgitation: Severe on 10/21 echo, echo today showed only trivial Bryan.     Past Medical History:  Diagnosis Date  . Alcohol abuse   . Asthma   . Cardiomyopathy (HCC)   . Dyspnea   . Hypertension      Family History  Problem Relation Age of Onset  . Hypertension Father   . Cancer Neg Hx   . Diabetes Neg Hx      Social History   Socioeconomic History  . Marital status: Single    Spouse name: Not on file  . Number of children: Not on file  . Years of  education: Not on file  . Highest education level: Not on file  Occupational History  . Not on file  Tobacco Use  . Smoking status: Former Smoker    Years: 15.00    Types: Cigars    Quit date: 07/14/2018    Years since quitting: 2.0  . Smokeless tobacco: Never Used  Vaping Use  . Vaping Use: Never used  Substance and Sexual Activity  . Alcohol use: Not Currently    Comment: quit 03/09/20  . Drug use: Not Currently    Types: Marijuana    Comment: quit 03/09/20  . Sexual activity: Not Currently  Other Topics Concern  . Not on file  Social History Narrative  . Not  on file   Social Determinants of Health   Financial Resource Strain: Not on file  Food Insecurity: No Food Insecurity  . Worried About Programme researcher, broadcasting/film/video in the Last Year: Never true  . Ran Out of Food in the Last Year: Never true  Transportation Needs: No Transportation Needs  . Lack of Transportation (Medical): No  . Lack of Transportation (Non-Medical): No  Physical Activity: Not on file  Stress: Not on file  Social Connections: Not on file  Intimate Partner Violence: Not on file     No Known Allergies   Outpatient Medications Prior to Visit  Medication Sig Dispense Refill  . amiodarone (PACERONE) 100 MG tablet Take 1 tablet (100 mg total) by mouth daily. 30 tablet 6  . apixaban (ELIQUIS) 5 MG TABS tablet Take 1 tablet (5 mg total) by mouth 2 (two) times daily. 60 tablet 0  . carvedilol (COREG) 6.25 MG tablet Take 1 tablet (6.25 mg total) by mouth 2 (two) times daily. 60 tablet 11  . dapagliflozin propanediol (FARXIGA) 10 MG TABS tablet Take 1 tablet (10 mg total) by mouth daily. 30 tablet 0  . EPINEPHrine 0.125 MG/ACT AERO Inhale 1 puff into the lungs 3 (three) times daily as needed (shortness od breath).    . Multiple Vitamin (MULTIVITAMIN WITH MINERALS) TABS tablet Take 1 tablet by mouth daily.    . sacubitril-valsartan (ENTRESTO) 97-103 MG Take 1 tablet by mouth 2 (two) times daily. 60 tablet 11  . spironolactone (ALDACTONE) 25 MG tablet Take 1 tablet (25 mg total) by mouth daily. 90 tablet 3   No facility-administered medications prior to visit.     Review of Systems     Objective:   Physical Exam There were no vitals filed for this visit.  Gen: Pleasant, well-nourished, in no distress,  normal affect  ENT: No lesions,  mouth clear,  oropharynx clear, no postnasal drip  Neck: No JVD, no TMG, no carotid bruits  Lungs: No use of accessory muscles, no dullness to percussion, clear without rales or rhonchi  Cardiovascular: RRR, heart sounds normal, no murmur or  gallops, no peripheral edema  Abdomen: soft and NT, no HSM,  BS normal  Musculoskeletal: No deformities, no cyanosis or clubbing  Neuro: alert, non focal  Skin: Warm, no lesions or rashes  No results found.        Assessment & Plan:  I personally reviewed all images and lab data in the [facility] system as well as any outside material available during this office visit and agree with the  radiology impressions.   No problem-specific Assessment & Plan notes found for this encounter.   There are no diagnoses linked to this encounter.

## 2020-08-10 ENCOUNTER — Other Ambulatory Visit: Payer: Self-pay

## 2020-08-10 ENCOUNTER — Ambulatory Visit: Payer: 59 | Attending: Critical Care Medicine | Admitting: Critical Care Medicine

## 2020-08-10 ENCOUNTER — Encounter: Payer: Self-pay | Admitting: Critical Care Medicine

## 2020-08-10 DIAGNOSIS — Z5329 Procedure and treatment not carried out because of patient's decision for other reasons: Secondary | ICD-10-CM

## 2020-08-10 DIAGNOSIS — Z91199 Patient's noncompliance with other medical treatment and regimen due to unspecified reason: Secondary | ICD-10-CM

## 2020-08-10 NOTE — Progress Notes (Signed)
This pt was a no show  

## 2020-08-11 ENCOUNTER — Ambulatory Visit: Payer: 59 | Admitting: Cardiology

## 2020-08-11 NOTE — Progress Notes (Incomplete)
Electrophysiology Office Note:    Date:  08/11/2020   ID:  [name], DOB [birth year], MRN [id number]  PCP:  Storm Frisk, MD  [facility] Cardiologist:  Thurmon Fair, MD  [facility] Electrophysiologist:  Lanier Prude, MD   Referring MD: Laurey Morale, MD   Chief Complaint: Nonischemic cardiomyopathy and atrial flutter  History of Present Illness:    Bryan Guerrero is a 50 y.o. male who presents for an evaluation of nonischemic cardiomyopathy and atrial flutter at the request of Dr. Shirlee Latch. Their medical history includes alcohol abuse in remission, hypertension.  He last saw Dr. Shirlee Latch on July 10, 2020.  His history of heart failure dates back to October 2021 when he was admitted with acute systolic heart failure complicated by cardiogenic shock requiring milrinone.  Echo at that time showed an ejection fraction less than 20% with severe right ventricular dysfunction and severe mitral regurgitation.  Left heart catheterization showed no significant coronary artery disease.  He was ultimately weaned off of milrinone.  Cardiac MRI was performed demonstrating hyper trabeculation consistent with a diagnosis of noncompaction cardiomyopathy.  Patient has successfully stopped drinking alcohol.  His echo remains abnormal with an ejection fraction of 25% at the January appointment with Dr. Shirlee Latch.  Has appoint with Dr. Shirlee Latch in January, he was noted to be euvolemic with NYHA class II symptoms.  He works as a Production designer, theatre/television/film at [employer]  His history of atrial flutter dates back to October 2021 when he was cardioverted.  Given the high risk of recurrence, Dr. Shirlee Latch would like the patient considered for atrial flutter ablation.  Past Medical History:  Diagnosis Date  . Alcohol abuse   . Asthma   . Cardiomyopathy (HCC)   . Dyspnea   . Hypertension     Past Surgical History:  Procedure Laterality Date  . CARDIOVERSION N/A 03/17/2020   Procedure: CARDIOVERSION;  Surgeon:  Lewayne Bunting, MD;  Location: [facility] ENDOSCOPY;  Service: Cardiovascular;  Laterality: N/A;  . RIGHT/LEFT HEART CATH AND CORONARY ANGIOGRAPHY N/A 03/19/2020   Procedure: RIGHT/LEFT HEART CATH AND CORONARY ANGIOGRAPHY;  Surgeon: Laurey Morale, MD;  Location: [facility] INVASIVE CV LAB;  Service: Cardiovascular;  Laterality: N/A;  . TEE WITHOUT CARDIOVERSION  10/052021  . TEE WITHOUT CARDIOVERSION N/A 03/17/2020   Procedure: TRANSESOPHAGEAL ECHOCARDIOGRAM (TEE);  Surgeon: Lewayne Bunting, MD;  Location: [facility] ENDOSCOPY;  Service: Cardiovascular;  Laterality: N/A;    Current Medications: No outpatient medications have been marked as taking for the 08/11/20 encounter (Appointment) with Lanier Prude, MD.     Allergies:   Patient has no known allergies.   Social History   Socioeconomic History  . Marital status: Single    Spouse name: Not on file  . Number of children: Not on file  . Years of education: Not on file  . Highest education level: Not on file  Occupational History  . Not on file  Tobacco Use  . Smoking status: Former Smoker    Years: 15.00    Types: Cigars    Quit date: 07/14/2018    Years since quitting: 2.0  . Smokeless tobacco: Never Used  Vaping Use  . Vaping Use: Never used  Substance and Sexual Activity  . Alcohol use: Not Currently    Comment: quit 03/09/20  . Drug use: Not Currently    Types: Marijuana    Comment: quit 03/09/20  . Sexual activity: Not Currently  Other Topics Concern  . Not on  file  Social History Narrative  . Not on file   Social Determinants of Health   Financial Resource Strain: Not on file  Food Insecurity: No Food Insecurity  . Worried About Programme researcher, broadcasting/film/video in the Last Year: Never true  . Ran Out of Food in the Last Year: Never true  Transportation Needs: No Transportation Needs  . Lack of Transportation (Medical): No  . Lack of Transportation (Non-Medical): No  Physical Activity: Not on file  Stress: Not on file  Social  Connections: Not on file     Family History: The patient's family history includes Hypertension in his father. There is no history of Cancer or Diabetes.  ROS:   Please see the history of present illness.    All other systems reviewed and are negative.  EKGs/Labs/Other Studies Reviewed:    The following studies were reviewed today:  July 10, 2020 echo personally reviewed Left ventricular function severely decreased, 25% Global hypokinesis Mildly dilated left ventricle Normal right ventricular function Trivial mitral regurgitation  03/20/2020 cMR IMPRESSION: 1.  Severe LV dilatation with severe systolic dysfunction (EF 13%) 2.  Moderate RV dilatation with severe systolic dysfunction (EF 20%) 3. 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 Basal septal midwall LGE, which is a scar pattern seen in nonischemic cardiomyopathies and associated with worse prognosis 5. RV insertion site LGE, which is a nonspecific finding often seen in setting of elevated pulmonary pressures 6.  Moderate mitral regurgitation (regurgitant fraction 32%) 7.  Small pericardial effusion    07/10/2020 ECG  Sinus rhythm.  Narrow QRS.  Single PVC.   03/16/2020 ECG Atrial flutter, typical appearing       EKG:  The ekg ordered today demonstrates ***  Recent Labs: 03/16/2020: B Natriuretic Peptide 1,394.3 03/21/2020: Magnesium 1.9 05/25/2020: TSH 2.088 07/10/2020: ALT 12; BUN 8; Creatinine, Ser 1.18; Hemoglobin 12.7; Platelets 254; Potassium 3.8; Sodium 139  Recent Lipid Panel No results found for: CHOL, TRIG, HDL, CHOLHDL, VLDL, LDLCALC, LDLDIRECT  Physical Exam:    VS:  There were no vitals taken for this visit.    Wt Readings from Last 3 Encounters:  07/10/20 201 lb 12.8 oz (91.5 kg)  05/25/20 200 lb 12.8 oz (91.1 kg)  04/28/20 198 lb 3.2 oz (89.9 kg)     GEN: *** Well  nourished, well developed in no acute distress HEENT: Normal NECK: No JVD; No carotid bruits LYMPHATICS: No lymphadenopathy CARDIAC: ***RRR, no murmurs, rubs, gallops RESPIRATORY:  Clear to auscultation without rales, wheezing or rhonchi  ABDOMEN: Soft, non-tender, non-distended MUSCULOSKELETAL:  No edema; No deformity  SKIN: Warm and dry NEUROLOGIC:  Alert and oriented x 3 PSYCHIATRIC:  Normal affect   ASSESSMENT:    1. Chronic systolic heart failure (HCC)   2. Atrial flutter, unspecified type (HCC)   3. Essential hypertension   4. Alcoholic cardiomyopathy (HCC)   5. Primary hypertension    PLAN:    In order of problems well listed above:  1. ***   Medication Adjustments/Labs and Tests Ordered: Current medicines are reviewed at length with the patient today.  Concerns regarding medicines are outlined above.  No orders of the defined types were placed in this encounter.  No orders of the defined types were placed in this encounter.    Signed, Steffanie Dunn, MD, [facility]  08/11/2020 8:19 AM    Electrophysiology [facility]

## 2020-08-20 ENCOUNTER — Encounter: Payer: Self-pay | Admitting: Cardiology

## 2020-11-10 ENCOUNTER — Encounter (HOSPITAL_COMMUNITY): Payer: 59 | Admitting: Cardiology

## 2020-12-24 ENCOUNTER — Other Ambulatory Visit (HOSPITAL_COMMUNITY): Payer: Self-pay | Admitting: Cardiology

## 2021-01-14 NOTE — Progress Notes (Deleted)
Electrophysiology Office Follow up Visit Note:    Date:  01/14/2021   ID:  Bryan Guerrero, DOB Mar 14, 1971, MRN [id number]  PCP:  Elsie Stain, MD  [facility] Cardiologist:  Sanda Klein, MD  [facility] Electrophysiologist:  Vickie Epley, MD    Interval History:    Bryan Guerrero is a 50 y.o. male who presents for a follow up visit. They were last seen in clinic 04/14/2020.  I met him originally when he was hospitalized for acute heart failure and atrial flutter. He was started on amiodarone. He has maintained normal rhythm while taking amiodarone.     Past Medical History:  Diagnosis Date   Alcohol abuse    Asthma    Cardiomyopathy ([place])    Dyspnea    Hypertension     Past Surgical History:  Procedure Laterality Date   CARDIOVERSION N/A 03/17/2020   Procedure: CARDIOVERSION;  Surgeon: Lelon Perla, MD;  Location: [facility] ENDOSCOPY;  Service: Cardiovascular;  Laterality: N/A;   RIGHT/LEFT HEART CATH AND CORONARY ANGIOGRAPHY N/A 03/19/2020   Procedure: RIGHT/LEFT HEART CATH AND CORONARY ANGIOGRAPHY;  Surgeon: Larey Dresser, MD;  Location: [place] CV LAB;  Service: Cardiovascular;  Laterality: N/A;   TEE WITHOUT CARDIOVERSION  10/052021   TEE WITHOUT CARDIOVERSION N/A 03/17/2020   Procedure: TRANSESOPHAGEAL ECHOCARDIOGRAM (TEE);  Surgeon: Lelon Perla, MD;  Location: [facility] ENDOSCOPY;  Service: Cardiovascular;  Laterality: N/A;    Current Medications: No outpatient medications have been marked as taking for the 01/15/21 encounter (Appointment) with Vickie Epley, MD.     Allergies:   Patient has no known allergies.   Social History   Socioeconomic History   Marital status: Single    Spouse name: Not on file   Number of children: Not on file   Years of education: Not on file   Highest education level: Not on file  Occupational History   Not on file  Tobacco Use   Smoking status: Former    Types: Cigars    Quit date: 07/14/2018    Years  since quitting: 2.5   Smokeless tobacco: Never  Vaping Use   Vaping Use: Never used  Substance and Sexual Activity   Alcohol use: Not Currently    Comment: quit 03/09/20   Drug use: Not Currently    Types: Marijuana    Comment: quit 03/09/20   Sexual activity: Not Currently  Other Topics Concern   Not on file  Social History Narrative   Not on file   Social Determinants of Health   Financial Resource Strain: Not on file  Food Insecurity: No Food Insecurity   Worried About Estate manager/land agent of Food in the Last Year: Never true   [place] in the Last Year: Never true  Transportation Needs: No Transportation Needs   Lack of Transportation (Medical): No   Lack of Transportation (Non-Medical): No  Physical Activity: Not on file  Stress: Not on file  Social Connections: Not on file     Family History: The patient's family history includes Hypertension in his father. There is no history of Cancer or Diabetes.  ROS:   Please see the history of present illness.    All other systems reviewed and are negative.  EKGs/Labs/Other Studies Reviewed:    The following studies were reviewed today:  07/10/2020 Echo LV function severely reduced, 25% RV normal Trivial MR   EKG:  The ekg ordered today demonstrates ***  Recent Labs: 03/16/2020: B  Natriuretic Peptide 1,394.3 03/21/2020: Magnesium 1.9 05/25/2020: TSH 2.088 07/10/2020: ALT 12; BUN 8; Creatinine, Ser 1.18; Hemoglobin 12.7; Platelets 254; Potassium 3.8; Sodium 139  Recent Lipid Panel No results found for: CHOL, TRIG, HDL, CHOLHDL, VLDL, LDLCALC, LDLDIRECT  Physical Exam:    VS:  There were no vitals taken for this visit.    Wt Readings from Last 3 Encounters:  07/10/20 201 lb 12.8 oz (91.5 kg)  05/25/20 200 lb 12.8 oz (91.1 kg)  04/28/20 198 lb 3.2 oz (89.9 kg)     GEN: *** Well nourished, well developed in no acute distress HEENT: Normal NECK: No JVD; No carotid bruits LYMPHATICS: No lymphadenopathy CARDIAC:  ***RRR, no murmurs, rubs, gallops RESPIRATORY:  Clear to auscultation without rales, wheezing or rhonchi  ABDOMEN: Soft, non-tender, non-distended MUSCULOSKELETAL:  No edema; No deformity  SKIN: Warm and dry NEUROLOGIC:  Alert and oriented x 3 PSYCHIATRIC:  Normal affect   ASSESSMENT:    No diagnosis found. PLAN:    In order of problems listed above:     Amio labs ? Ablation and discontinue amio?       Total time spent with patient today *** minutes. This includes reviewing records, evaluating the patient and coordinating care.   Medication Adjustments/Labs and Tests Ordered: Current medicines are reviewed at length with the patient today.  Concerns regarding medicines are outlined above.  No orders of the defined types were placed in this encounter.  No orders of the defined types were placed in this encounter.    Signed, Lars Mage, MD, [facility], [facility] 01/14/2021 10:08 PM    Electrophysiology [facility]

## 2021-01-15 ENCOUNTER — Ambulatory Visit: Payer: 59 | Admitting: Cardiology

## 2021-01-15 DIAGNOSIS — I4892 Unspecified atrial flutter: Secondary | ICD-10-CM

## 2021-01-15 DIAGNOSIS — F1010 Alcohol abuse, uncomplicated: Secondary | ICD-10-CM

## 2021-01-15 DIAGNOSIS — I5022 Chronic systolic (congestive) heart failure: Secondary | ICD-10-CM

## 2021-01-15 DIAGNOSIS — I10 Essential (primary) hypertension: Secondary | ICD-10-CM

## 2021-01-26 ENCOUNTER — Ambulatory Visit (HOSPITAL_COMMUNITY)
Admission: RE | Admit: 2021-01-26 | Discharge: 2021-01-26 | Disposition: A | Discharge status: Home / Self Care | Payer: 59 | Source: Ambulatory Visit | Attending: Cardiology | Admitting: Cardiology

## 2021-01-26 ENCOUNTER — Encounter (HOSPITAL_COMMUNITY): Payer: Self-pay | Admitting: Cardiology

## 2021-01-26 ENCOUNTER — Other Ambulatory Visit: Payer: Self-pay

## 2021-01-26 VITALS — BP 128/84 | HR 77 | Ht 72.0 in | Wt 203.0 lb

## 2021-01-26 DIAGNOSIS — Z833 Family history of diabetes mellitus: Secondary | ICD-10-CM | POA: Insufficient documentation

## 2021-01-26 DIAGNOSIS — Z7984 Long term (current) use of oral hypoglycemic drugs: Secondary | ICD-10-CM | POA: Insufficient documentation

## 2021-01-26 DIAGNOSIS — I498 Other specified cardiac arrhythmias: Secondary | ICD-10-CM | POA: Diagnosis not present

## 2021-01-26 DIAGNOSIS — Z87891 Personal history of nicotine dependence: Secondary | ICD-10-CM | POA: Insufficient documentation

## 2021-01-26 DIAGNOSIS — Z79899 Other long term (current) drug therapy: Secondary | ICD-10-CM | POA: Diagnosis not present

## 2021-01-26 DIAGNOSIS — N183 Chronic kidney disease, stage 3 unspecified: Secondary | ICD-10-CM | POA: Insufficient documentation

## 2021-01-26 DIAGNOSIS — Z7901 Long term (current) use of anticoagulants: Secondary | ICD-10-CM | POA: Insufficient documentation

## 2021-01-26 DIAGNOSIS — I4892 Unspecified atrial flutter: Secondary | ICD-10-CM | POA: Insufficient documentation

## 2021-01-26 DIAGNOSIS — I5022 Chronic systolic (congestive) heart failure: Secondary | ICD-10-CM | POA: Diagnosis not present

## 2021-01-26 DIAGNOSIS — Z8249 Family history of ischemic heart disease and other diseases of the circulatory system: Secondary | ICD-10-CM | POA: Insufficient documentation

## 2021-01-26 DIAGNOSIS — I130 Hypertensive heart and chronic kidney disease with heart failure and stage 1 through stage 4 chronic kidney disease, or unspecified chronic kidney disease: Secondary | ICD-10-CM | POA: Insufficient documentation

## 2021-01-26 DIAGNOSIS — F1010 Alcohol abuse, uncomplicated: Secondary | ICD-10-CM | POA: Diagnosis not present

## 2021-01-26 DIAGNOSIS — I5082 Biventricular heart failure: Secondary | ICD-10-CM | POA: Diagnosis not present

## 2021-01-26 DIAGNOSIS — I428 Other cardiomyopathies: Secondary | ICD-10-CM | POA: Insufficient documentation

## 2021-01-26 MED ORDER — CARVEDILOL 12.5 MG PO TABS: 12.5000 mg | ORAL_TABLET | Freq: Two times a day (BID) | ORAL | 3 refills | Status: DC

## 2021-01-26 NOTE — Patient Instructions (Signed)
No Labs done today.   INCREASE Carvedilol to 12.5mg  (1 tablet) by mouth 2 times daily.   No other medication changes were made. Please continue all current medications as prescribed.  Your physician recommends that you schedule a follow-up appointment soon for an echo, 3 weeks with our pharmacy clinic and in 2 months with Dr. Shirlee Latch.  Your physician has requested that you have an echocardiogram. Echocardiography is a painless test that uses sound waves to create images of your heart. It provides your doctor with information about the size and shape of your heart and how well your heart's chambers and valves are working. This procedure takes approximately one hour. There are no restrictions for this procedure.  If you have any questions or concerns before your next appointment please send [place] a message through [place] or call our office at [phone].    TO LEAVE A MESSAGE FOR THE NURSE SELECT OPTION 2, PLEASE LEAVE A MESSAGE INCLUDING: YOUR NAME DATE OF BIRTH CALL BACK NUMBER REASON FOR CALL**this is important as we prioritize the call backs  YOU WILL RECEIVE A CALL BACK THE SAME DAY AS LONG AS YOU CALL BEFORE 4:00 PM   Do the following things EVERYDAY: Weigh yourself in the morning before breakfast. Write it down and keep it in a log. Take your medicines as prescribed Eat low salt foods--Limit salt (sodium) to 2000 mg per day.  Stay as active as you can everyday Limit all fluids for the day to less than 2 liters   At the [facility], you and your health needs are our priority. As part of our continuing mission to provide you with exceptional heart care, we have created designated Provider Care Teams. These Care Teams include your primary Cardiologist (physician) and Advanced Practice Providers (APPs- Physician Assistants and Nurse Practitioners) who all work together to provide you with the care you need, when you need it.   You may see any of the following  providers on your designated Care Team at your next follow up: Dr Arvilla Meres Dr Carron Curie, NP Robbie Lis, [place] Karle Plumber, PharmD   Please be sure to bring in all your medications bottles to every appointment.

## 2021-01-26 NOTE — Progress Notes (Signed)
Advanced Heart Failure Clinic Note   Referring Physician: PCP: Storm Frisk, MD HF Cardiology: Dr. Shirlee Latch  Reason for Visit: Heart Failure   HPI: Mr Campoy is a 50 y.o. with history of chronic systolic heart failure dating back to 2020, ETOH abuse, HTN, and tobacco abuse.     Admitted back in February 2020 with increased shortness of breath. At that time he was drinking 1/5 of licquor per day. ECHO completed and showed severely reduced EF 15-20%. 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 No formal cath. He was discharged 08/08/19. He returned for one appointment with cardiology but no return f/u after that.   Admitted 10/21 for acute on chronic systolic heart failure + atrial flutter w/ RVR requiring DCCV. Also w/ low output requiring milrinone. R/LHC showed mildly elevated PCWP, normal RA pressure, normal cardiac output on milrinone and no significant coronary disease. Echo showed severe LVEF <20%, severe RV dysfunction and severe MR. He was diuresed w/ IV Lasix. Was able to wean off milrinone. Discharge wt was 186 lb.   Echo in 1/22 showed EF 25% with global hypokinesis, normal RV, trivial MR.    Today he returns for HF followup. 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  He missed his EP appointment when his wife was sick with ovarian cancer, she has subsequently passed away.  No palpitations, in NSR today.  He is off amiodarone now, ran out and did not get refilled.  No ETOH or smoking.  Feeling ok.  No dyspnea with his normal activities.  No orthopnea/PND.  No chest pain, no lightheadedness.  No BRBPR/melena. Weight up 2 lbs.   ECG (personally reviewed): NSR with sinus arrhythmia, narrow QRS  Labs (12/21): TSH normal, K 4.2, creatinine 1.3 Labs (1/22): K 3.8, creatinine 1.18  PMH: 1. HTN 2. ETOH abuse 3. Chronic systolic CHF: Nonischemic cardiomyopathy.   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  - LHC/RHC (10/21): No significant CAD; mean RA 4, PA 34/8, mean PCWP 18, CI 3.01.  - Echo (1/22):  EF 25% with global hypokinesis, normal RV, trivial MR. 4. Atrial flutter: DCCV 10/21.  5. CKD stage 3  Review of systems complete and found to be negative unless listed in HPI.     Current Outpatient Medications  Medication Sig Dispense Refill   apixaban (ELIQUIS) 5 MG TABS tablet Take 5 mg by mouth 2 (two) times daily.     dapagliflozin propanediol (FARXIGA) 10 MG TABS tablet Take 1 tablet (10 mg total) by mouth daily. 30 tablet 0   EPINEPHrine 0.125 MG/ACT AERO Inhale 1 puff into the lungs 3 (three) times daily as needed (shortness od breath).     Multiple Vitamin (MULTIVITAMIN WITH MINERALS) TABS tablet Take 1 tablet by mouth daily.     sacubitril-valsartan (ENTRESTO) 97-103 MG Take 1 tablet by mouth 2 (two) times daily. 60 tablet 11   spironolactone (ALDACTONE) 25 MG tablet Take 1 tablet (25 mg total) by mouth daily. 90 tablet 3   carvedilol (COREG) 12.5 MG tablet Take 1 tablet (12.5 mg total) by mouth 2 (two) times daily. 180 tablet 3   No current facility-administered medications for this encounter.    No Known Allergies    Social History   Socioeconomic History   Marital status: Single    Spouse name: Not on file   Number  of children: Not on file   Years of education: Not on file   Highest education level: Not on file  Occupational History   Not on file  Tobacco Use   Smoking status: Former    Types: Cigars    Quit date: 07/14/2018    Years since quitting: 2.5   Smokeless tobacco: Never  Vaping Use   Vaping Use: Never used  Substance and Sexual Activity   Alcohol use: Not Currently    Comment: quit 03/09/20   Drug use: Not Currently    Types: Marijuana    Comment: quit 03/09/20   Sexual activity: Not Currently  Other Topics Concern   Not on file  Social History Narrative    Not on file   Social Determinants of Health   Financial Resource Strain: Not on file  Food Insecurity: No Food Insecurity   Worried About Radiation protection practitioner of Food in the Last Year: Never true   Ran Out of Food in the Last Year: Never true  Transportation Needs: No Transportation Needs   Lack of Transportation (Medical): No   Lack of Transportation (Non-Medical): No  Physical Activity: Not on file  Stress: Not on file  Social Connections: Not on file  Intimate Partner Violence: Not on file      Family History  Problem Relation Age of Onset   Hypertension Father    Cancer Neg Hx    Diabetes Neg Hx     Vitals:   01/26/21 1024  BP: 128/84  Pulse: 77  SpO2: 99%  Weight: 92.1 kg (203 lb)  Height: 6' (1.829 m)   PHYSICAL EXAM: General: NAD Neck: No JVD, no thyromegaly or thyroid nodule.  Lungs: Clear to auscultation bilaterally with normal respiratory effort. CV: Nondisplaced PMI.  Heart regular S1/S2, no S3/S4, no murmur.  No peripheral edema.  No carotid bruit.  Normal pedal pulses.  Abdomen: Soft, nontender, no hepatosplenomegaly, no distention.  Skin: Intact without lesions or rashes.  Neurologic: Alert and oriented x 3.  Psych: Normal affect. Extremities: No clubbing or cyanosis.  HEENT: Normal.   ASSESSMENT & PLAN:  1. Chronic Systolic CHF: Nonischemic cardiomyopathy.  Admission 10/21 for acute systolic CHF w/ low output requiring milrinone. Biventricular failure, echo 10/21 EF < 20%, severe RV dysfunction, severe MR. LHC showed no significant coronary disease. CO good on milrinone, was able to wean off. 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  Patient has history of heavy ETOH, has now quit. Cause of cardiomyopathy likely noncompaction versus ETOH.  He has quit drinking. Echo in 1/22 with EF 25%, normal RV, trivial MR.  On exam, he is not volume overloaded.  NYHA class II symptoms.  - Increase Coreg to 12.5 mg bid.   - Continue  spironolactone 25 daily. BMET today.  - Continue Entresto 97/103 mg bid - Continue farxiga 10 mg daily  - I will arrange for repeat echo and will refer again to EP if EF remains low for ICD evaluation.  Narrow QRS, not CRT candidate.  2. Atrial flutter: s/p DCCV 10/21.  Suspect recurrence risk is high.  He is in NSR today.  He has been off amiodarone for several months.  - Continue Eliquis.  - To be referred to EP for ICD, would also like flutter ablation considered.  3. Stage 3 CKD: BMET today.  4.  ETOH abuse: Staying off ETOH.  6. Smoking: No longer smoking.   7. Mitral regurgitation: Severe on 10/21 echo, echo in 1/22 showed  only trivial MR.   Followup in 3 wks with HF pharmacist to try to titrate up Coreg.  See me in 2 months.   Marca Ancona, MD 01/26/21

## 2021-02-10 ENCOUNTER — Ambulatory Visit (HOSPITAL_COMMUNITY): Admission: RE | Admit: 2021-02-10 | Payer: 59 | Source: Ambulatory Visit

## 2021-02-12 ENCOUNTER — Other Ambulatory Visit (HOSPITAL_COMMUNITY): Payer: Self-pay

## 2021-02-20 IMAGING — DX DG CHEST 2V
2 series · 2 of 2 positions shown · non-contrast
Comparison: 08/04/2018

CLINICAL DATA: Chest pain

EXAM:
CHEST - 2 VIEW

[chest pa]
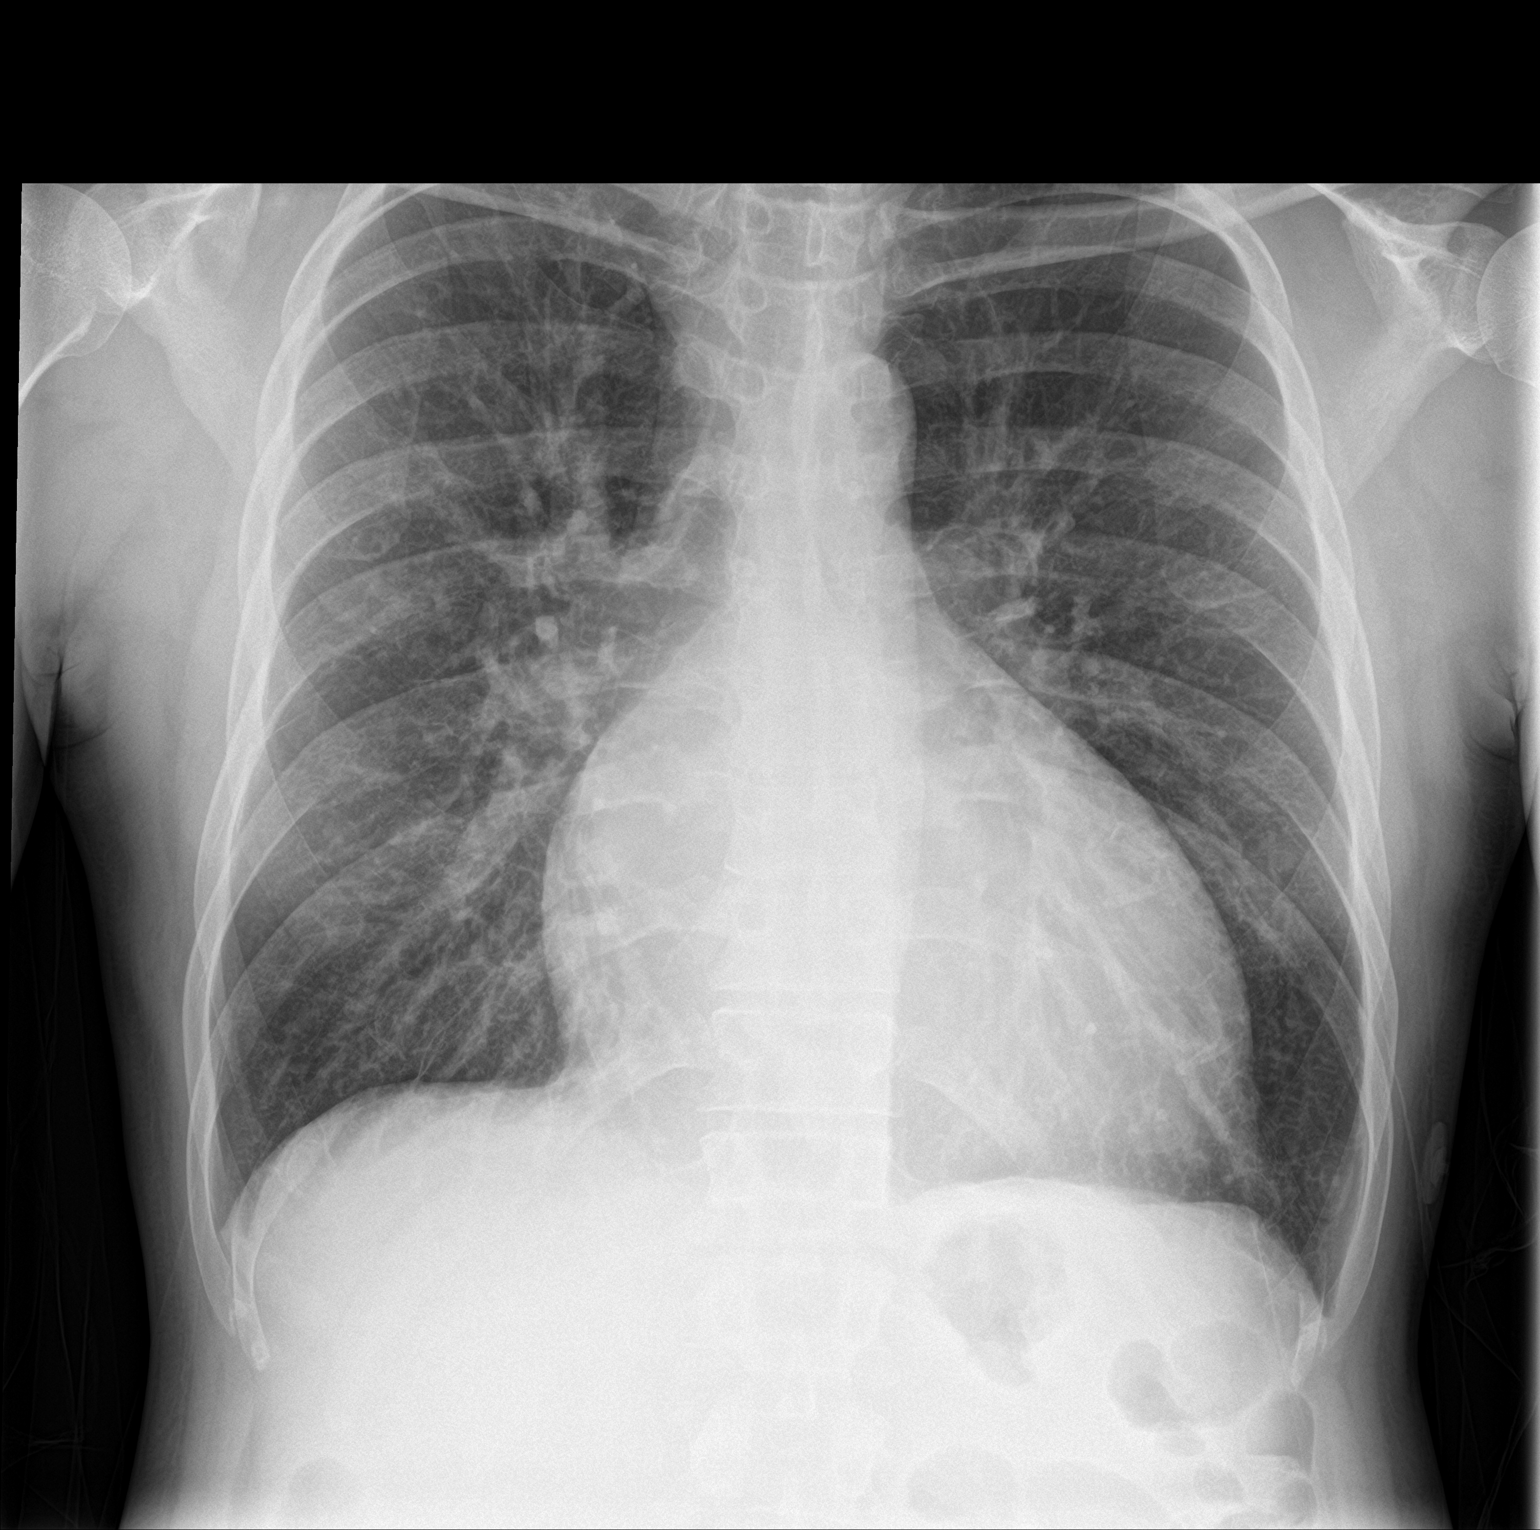

[chest lat]
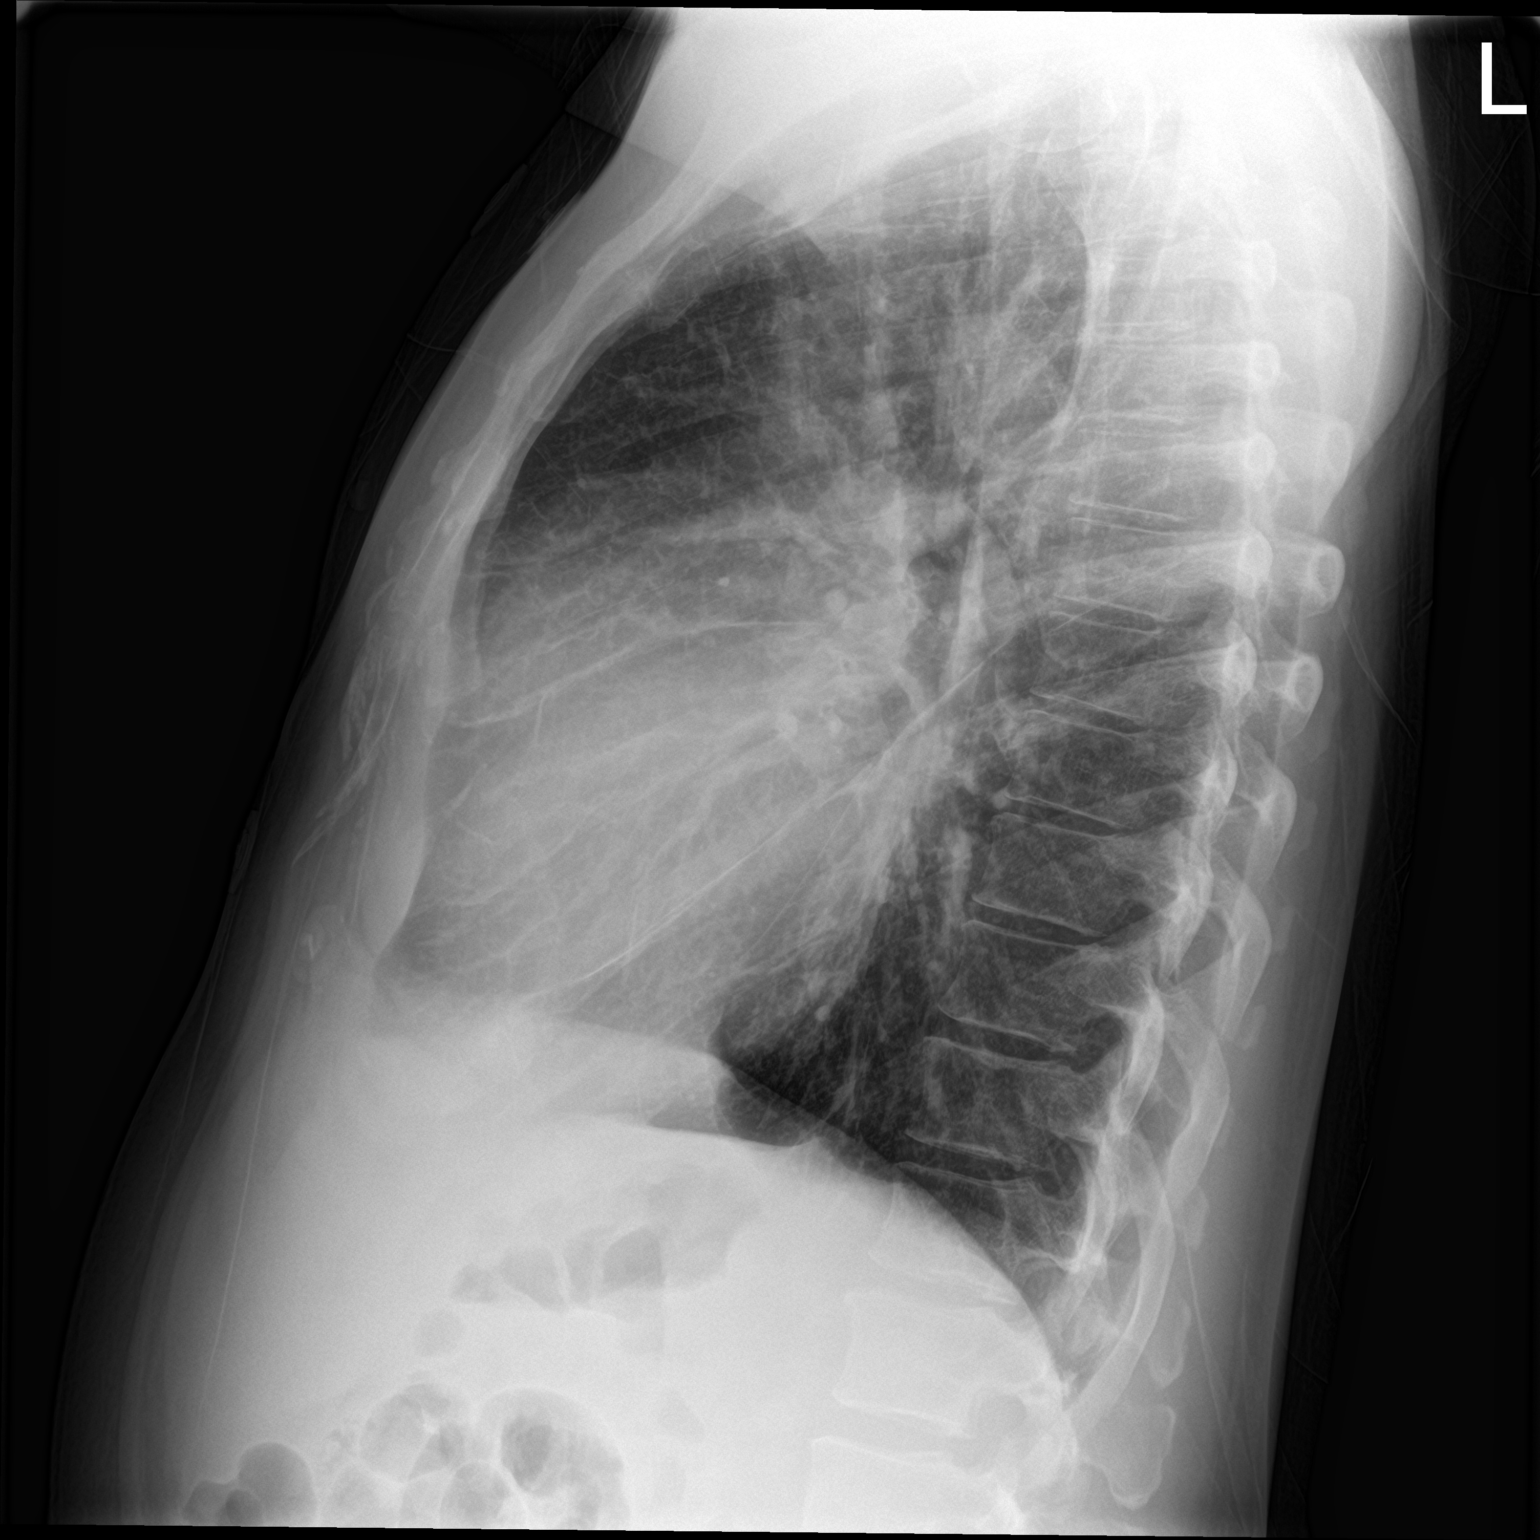

[2 of 2 positions shown; findings below may reference images not displayed]

FINDINGS: Similar enlargement cardiopericardial silhouette. The lungs are
clear without focal pneumonia, edema, pneumothorax or pleural
effusion. The visualized bony structures of the thorax show no acute
abnormality.
IMPRESSION: Similar enlargement of the cardiopericardial silhouette. No acute
findings.

## 2021-02-23 ENCOUNTER — Inpatient Hospital Stay (HOSPITAL_COMMUNITY): Admission: RE | Admit: 2021-02-23 | Payer: 59 | Source: Ambulatory Visit

## 2021-03-05 ENCOUNTER — Other Ambulatory Visit (HOSPITAL_COMMUNITY): Payer: Self-pay | Admitting: *Deleted

## 2021-03-05 ENCOUNTER — Other Ambulatory Visit (HOSPITAL_COMMUNITY): Payer: Self-pay

## 2021-03-05 ENCOUNTER — Telehealth (HOSPITAL_COMMUNITY): Payer: Self-pay | Admitting: Pharmacy Technician

## 2021-03-05 MED ORDER — APIXABAN 5 MG PO TABS: 5.0000 mg | ORAL_TABLET | Freq: Two times a day (BID) | ORAL | 3 refills | Status: DC

## 2021-03-05 NOTE — Telephone Encounter (Signed)
Advanced Heart Failure Patient Advocate Encounter  Received a patient assistance renewal application for Eliquis from [employer]. The patient currently has Nurse, learning disability. The 90 co-pay is, $292.43. Was able to activate a co-pay card, new 90 day co-pay $30. Will not seek renewal at this time. Attempted to call patient to make him aware of change, the call did not go through after several attempts. Will send Eliquis to [facility] where it looks like he gets most medications from with the co-pay card attached.  BIN [id number] PCN LOYALTY ID [id number] Group [id number]  Archer Asa, CPhT

## 2021-03-23 ENCOUNTER — Other Ambulatory Visit (HOSPITAL_COMMUNITY): Payer: Self-pay | Admitting: Cardiology

## 2021-03-23 ENCOUNTER — Other Ambulatory Visit (HOSPITAL_COMMUNITY): Payer: Self-pay

## 2021-03-31 ENCOUNTER — Other Ambulatory Visit: Payer: Self-pay

## 2021-03-31 ENCOUNTER — Encounter (HOSPITAL_COMMUNITY): Payer: Self-pay | Admitting: Cardiology

## 2021-03-31 ENCOUNTER — Other Ambulatory Visit (HOSPITAL_COMMUNITY): Payer: Self-pay

## 2021-03-31 ENCOUNTER — Ambulatory Visit (HOSPITAL_COMMUNITY)
Admission: RE | Admit: 2021-03-31 | Discharge: 2021-03-31 | Disposition: A | Discharge status: Home / Self Care | Payer: 59 | Source: Ambulatory Visit | Attending: Cardiology | Admitting: Cardiology

## 2021-03-31 VITALS — BP 150/100 | HR 76 | Wt 200.6 lb

## 2021-03-31 DIAGNOSIS — Z87891 Personal history of nicotine dependence: Secondary | ICD-10-CM | POA: Diagnosis not present

## 2021-03-31 DIAGNOSIS — I428 Other cardiomyopathies: Secondary | ICD-10-CM | POA: Insufficient documentation

## 2021-03-31 DIAGNOSIS — Z7901 Long term (current) use of anticoagulants: Secondary | ICD-10-CM | POA: Diagnosis not present

## 2021-03-31 DIAGNOSIS — F1010 Alcohol abuse, uncomplicated: Secondary | ICD-10-CM | POA: Insufficient documentation

## 2021-03-31 DIAGNOSIS — Z7984 Long term (current) use of oral hypoglycemic drugs: Secondary | ICD-10-CM | POA: Insufficient documentation

## 2021-03-31 DIAGNOSIS — I130 Hypertensive heart and chronic kidney disease with heart failure and stage 1 through stage 4 chronic kidney disease, or unspecified chronic kidney disease: Secondary | ICD-10-CM | POA: Insufficient documentation

## 2021-03-31 DIAGNOSIS — I4892 Unspecified atrial flutter: Secondary | ICD-10-CM | POA: Insufficient documentation

## 2021-03-31 DIAGNOSIS — N183 Chronic kidney disease, stage 3 unspecified: Secondary | ICD-10-CM | POA: Insufficient documentation

## 2021-03-31 DIAGNOSIS — Z09 Encounter for follow-up examination after completed treatment for conditions other than malignant neoplasm: Secondary | ICD-10-CM | POA: Diagnosis not present

## 2021-03-31 DIAGNOSIS — Z8249 Family history of ischemic heart disease and other diseases of the circulatory system: Secondary | ICD-10-CM | POA: Insufficient documentation

## 2021-03-31 DIAGNOSIS — I5022 Chronic systolic (congestive) heart failure: Secondary | ICD-10-CM | POA: Insufficient documentation

## 2021-03-31 DIAGNOSIS — Z79899 Other long term (current) drug therapy: Secondary | ICD-10-CM | POA: Insufficient documentation

## 2021-03-31 DIAGNOSIS — I5082 Biventricular heart failure: Secondary | ICD-10-CM | POA: Insufficient documentation

## 2021-03-31 LAB — BASIC METABOLIC PANEL
Anion gap: 7 (ref 5–15)
BUN: 12 mg/dL (ref 6–20)
CO2: 23 mmol/L (ref 22–32)
Calcium: 9 mg/dL (ref 8.9–10.3)
Chloride: 108 mmol/L (ref 98–111)
Creatinine, Ser: 1.14 mg/dL (ref 0.61–1.24)
GFR, Estimated: >60 mL/min (ref >60)
Glucose, Bld: 102 mg/dL — ABNORMAL HIGH (ref 70–99)
Potassium: 4.2 mmol/L (ref 3.5–5.1)
Sodium: 138 mmol/L (ref 135–145)

## 2021-03-31 LAB — CBC
HCT: 40.5 % (ref 39.0–52.0)
Hemoglobin: 13 g/dL (ref 13.0–17.0)
MCH: 31.4 pg (ref 26.0–34.0)
MCHC: 32.1 g/dL (ref 30.0–36.0)
MCV: 97.8 fL (ref 80.0–100.0)
Platelets: 182 10*3/uL (ref 150–400)
RBC: 4.14 MIL/uL — ABNORMAL LOW (ref 4.22–5.81)
RDW: 12.3 % (ref 11.5–15.5)
WBC: 5.2 10*3/uL (ref 4.0–10.5)
nRBC: 0 % (ref 0.0–0.2)

## 2021-03-31 MED ORDER — SACUBITRIL-VALSARTAN 97-103 MG PO TABS: 1.0000 | ORAL_TABLET | Freq: Two times a day (BID) | ORAL | 3 refills | Status: DC

## 2021-03-31 MED ORDER — CARVEDILOL 12.5 MG PO TABS: 18.7500 mg | ORAL_TABLET | Freq: Two times a day (BID) | ORAL | 3 refills | Status: DC

## 2021-03-31 NOTE — Progress Notes (Signed)
Advanced Heart Failure Clinic Note   Referring Physician: PCP: Storm Frisk, MD HF Cardiology: Dr. Shirlee Latch  Reason for Visit: Heart Failure   HPI: [name] is a 50 y.o. with history of chronic systolic heart failure dating back to 2020, ETOH abuse, HTN, and tobacco abuse.     Admitted back in February 2020 with increased shortness of breath. At that time he was drinking 1/5 of licquor per day. ECHO completed and showed severely reduced EF 15-20%. 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 No formal cath. He was discharged 08/08/19. He returned for one appointment with cardiology but no return f/u after that.   Admitted 10/21 for acute on chronic systolic heart failure + atrial flutter w/ RVR requiring DCCV. Also w/ low output requiring milrinone. R/LHC showed mildly elevated PCWP, normal RA pressure, normal cardiac output on milrinone and no significant coronary disease. Echo showed severe LVEF <20%, severe RV dysfunction and severe MR. He was diuresed w/ IV Lasix. Was able to wean off milrinone. Discharge wt was 186 lb.   Echo in 1/22 showed EF 25% with global hypokinesis, normal RV, trivial MR.    Today he returns for HF followup. 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  No exertional dyspnea.  No chest pain.  No palpitations.  Overall, feels good.  No episodes of lightheadedness.  He needs a refill of Entresto.  Weight down 3 lbs.  BP is elevated today, has been out of Entresto for a day.   ECG (personally reviewed): NSR, normal   Labs (12/21): TSH normal, K 4.2, creatinine 1.3 Labs (1/22): K 3.8, creatinine 1.18  PMH: 1. HTN 2. ETOH abuse 3. Chronic systolic CHF: Nonischemic cardiomyopathy.   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  - LHC/RHC (10/21): No significant CAD; mean RA 4, PA 34/8, mean PCWP 18, CI  3.01.  - Echo (1/22):  EF 25% with global hypokinesis, normal RV, trivial MR. 4. Atrial flutter: DCCV 10/21.  5. CKD stage 3  Review of systems complete and found to be negative unless listed in HPI.     Current Outpatient Medications  Medication Sig Dispense Refill   dapagliflozin propanediol (FARXIGA) 10 MG TABS tablet Take 1 tablet (10 mg total) by mouth daily. 30 tablet 0   ELIQUIS 5 MG TABS tablet TAKE 1 TABLET BY MOUTH TWICE DAILY 180 tablet 3   Multiple Vitamin (MULTIVITAMIN WITH MINERALS) TABS tablet Take 1 tablet by mouth daily.     spironolactone (ALDACTONE) 25 MG tablet Take 1 tablet (25 mg total) by mouth daily. 90 tablet 3   carvedilol (COREG) 12.5 MG tablet Take 1.5 tablets (18.75 mg total) by mouth 2 (two) times daily. 270 tablet 3   sacubitril-valsartan (ENTRESTO) 97-103 MG Take 1 tablet by mouth 2 (two) times daily. 180 tablet 3   No current facility-administered medications for this encounter.    No Known Allergies    Social History   Socioeconomic History   Marital status: Single    Spouse name: Not on file   Number of children: Not on file   Years of education: Not on file   Highest education level: Not on file  Occupational History   Not on file  Tobacco Use   Smoking status: Former    Types: Cigars    Quit date: 07/14/2018    Years  since quitting: 2.7   Smokeless tobacco: Never  Vaping Use   Vaping Use: Never used  Substance and Sexual Activity   Alcohol use: Not Currently    Comment: quit 03/09/20   Drug use: Not Currently    Types: Marijuana    Comment: quit 03/09/20   Sexual activity: Not Currently  Other Topics Concern   Not on file  Social History Narrative   Not on file   Social Determinants of Health   Financial Resource Strain: Not on file  Food Insecurity: Not on file  Transportation Needs: Not on file  Physical Activity: Not on file  Stress: Not on file  Social Connections: Not on file  Intimate Partner Violence: Not on file       Family History  Problem Relation Age of Onset   Hypertension Father    Cancer Neg Hx    Diabetes Neg Hx     Vitals:   03/31/21 0924  BP: (!) 150/100  Pulse: 76  SpO2: 98%  Weight: 91 kg (200 lb 9.6 oz)   PHYSICAL EXAM: General: NAD Neck: No JVD, no thyromegaly or thyroid nodule.  Lungs: Clear to auscultation bilaterally with normal respiratory effort. CV: Nondisplaced PMI.  Heart regular S1/S2, no S3/S4, no murmur.  No peripheral edema.  No carotid bruit.  Normal pedal pulses.  Abdomen: Soft, nontender, no hepatosplenomegaly, no distention.  Skin: Intact without lesions or rashes.  Neurologic: Alert and oriented x 3.  Psych: Normal affect. Extremities: No clubbing or cyanosis.  HEENT: Normal.   ASSESSMENT & PLAN:  1. Chronic Systolic CHF: Nonischemic cardiomyopathy.  Admission 10/21 for acute systolic CHF w/ low output requiring milrinone. Biventricular failure, echo 10/21 EF < 20%, severe RV dysfunction, severe MR. LHC showed no significant coronary disease. CO good on milrinone, was able to wean off. 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  Patient has history of heavy ETOH, has now quit. Cause of cardiomyopathy likely noncompaction versus ETOH.  He has quit drinking. Echo in 1/22 with EF 25%, normal RV, trivial MR.  No volume overload, NYHA class I symptoms.   - Increase Coreg to 18.75 mg bid.   - Continue spironolactone 25 daily. BMET today.  - Continue Entresto 97/103 mg bid - Continue farxiga 10 mg daily  - Needs repeat echo at followup in 3 months, will refer to EP if EF remains low for ICD evaluation.  Narrow QRS, not CRT candidate.  2. Atrial flutter: s/p DCCV 10/21.  Suspect recurrence risk is high.  He is in NSR today.  He is off amiodarone.  - Continue Eliquis. CBC today.  - If/when referred to EP for ICD, would also like flutter ablation considered.  3. Stage 3 CKD: BMET today.  4.  ETOH abuse: Staying off ETOH.   6. Smoking: No longer smoking.   7. Mitral regurgitation: Severe on 10/21 echo, echo in 1/22 showed only trivial MR.   Followup in 3 wks with HF pharmacist to try to titrate up Coreg.  See me in 3 months with echo.  Marca Ancona, MD 03/31/21

## 2021-03-31 NOTE — Patient Instructions (Signed)
INCREASE Coreg to 18.75mg  (1.5 tabs) twice  a day  A refill for Sherryll Burger was sent to your local pharmacy.  You have been provided a co pay card  Labs today We will only contact you if something comes back abnormal or we need to make some changes. Otherwise no news is good news!  Your physician has requested that you have an echocardiogram. Echocardiography is a painless test that uses sound waves to create images of your heart. It provides your doctor with information about the size and shape of your heart and how well your heart's chambers and valves are working. This procedure takes approximately one hour. There are no restrictions for this procedure.   Your physician recommends that you schedule a follow-up appointment in: 3 months with an ECHO.  3 weeks with pharmacy  Please call office at [phone] option 2 if you have any questions or concerns.   At the [facility], you and your health needs are our priority. As part of our continuing mission to provide you with exceptional heart care, we have created designated Provider Care Teams. These Care Teams include your primary Cardiologist (physician) and Advanced Practice Providers (APPs- Physician Assistants and Nurse Practitioners) who all work together to provide you with the care you need, when you need it.   You may see any of the following providers on your designated Care Team at your next follow up: Dr Arvilla Meres Dr Marca Ancona Dr Brandon Melnick, NP Robbie Lis, [place] Mikki Santee Karle Plumber, PharmD   Please be sure to bring in all your medications bottles to every appointment.

## 2021-04-08 ENCOUNTER — Telehealth (HOSPITAL_COMMUNITY): Payer: Self-pay | Admitting: Pharmacy Technician

## 2021-04-08 ENCOUNTER — Other Ambulatory Visit (HOSPITAL_COMMUNITY): Payer: Self-pay

## 2021-04-08 NOTE — Telephone Encounter (Signed)
Advanced Heart Failure Patient Advocate Encounter  Received a patient assistance renewal application for Farxiga from [employer]. The patient currently has Multimedia programmer. The patient was previously receiving Entresto and Eliquis from the manufacturer as well. Will not seek renewal at this time.  Attempted to walk the patient through obtaining co-pay cards. He opted to get a physical copy of the two copay cards he did not have at his next appointment on 11/09. Stated he would get refills sent during that time as well.   Archer Asa, CPhT

## 2021-04-09 ENCOUNTER — Other Ambulatory Visit (HOSPITAL_COMMUNITY): Payer: Self-pay | Admitting: Cardiology

## 2021-04-09 NOTE — Progress Notes (Signed)
PCP: Storm Frisk, MD HF Cardiology: Dr. Shirlee Latch  HPI:  Mr. Bryan Guerrero is a 50 y.o. with history of chronic systolic heart failure dating back to 2020, ETOH abuse, HTN, and tobacco abuse.     Admitted back in February 2020 with increased shortness of breath. At that time he was drinking 1/5 of licquor per day. ECHO completed and showed severely reduced EF 15-20%. 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 No formal cath. He was discharged 08/08/19. He returned for one appointment with cardiology but no return f/u after that.    Admitted 03/2020 for acute on chronic systolic heart failure + atrial flutter w/ RVR requiring DCCV. Also w/ low output requiring milrinone. R/LHC showed mildly elevated PCWP, normal RA pressure, normal cardiac output on milrinone and no significant coronary disease. Echo showed severe LVEF <20%, severe RV dysfunction and severe MR. He was diuresed w/ IV Lasix. Was able to wean off milrinone. Discharge weight was 186 lbs.    Echo in 06/2020 showed EF 25% with global hypokinesis, normal RV, trivial MR.    Recently returned to HF Clinic for HF followup 03/31/21. 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  No exertional dyspnea.  No chest pain.  No palpitations.  Overall, felt good.  No episodes of lightheadedness.  He needed a refill of Entresto.  Weight was down 3 lbs.  BP was elevated in clinic, had been out of Entresto for a day.   Today he returns to HF clinic for pharmacist medication titration. At last visit with MD carvedilol was increased to 18.75 mg BID. Overall he is feeling well today. No dizziness, lightheadedness, chest pain or palpitations. No SOB/DOE. Is active at his job, on his feet 8 hours per day with no issues. Weight at home has been stable. Does not need any loop diuretic. No LEE, PND or orthopnea. Provided Eliquis and Marcelline Deist copay cards for patient today.    HF Medications: Carvedilol 18.75 mg BID Entresto 97/103 mg BID Spironolactone 25 mg  daily Farxiga 10 mg daily  Has the patient been experiencing any side effects to the medications prescribed?  no  Does the patient have any problems obtaining medications due to transportation or finances?   No - has Multimedia programmer. Has copay card for [facility]. Provided Eliquis and Farxiga copay cards today.   Understanding of regimen: good Understanding of indications: good Potential of compliance: good Patient understands to avoid NSAIDs. Patient understands to avoid decongestants.    Pertinent Lab Values: 03/31/21: Serum creatinine 1.14, BUN 12, Potassium 4.2, Sodium 138  Vital Signs: Weight: 196 lbs (last clinic weight: 200.6 lbs) Blood pressure: 132/88  Heart rate: 70   Assessment/Plan: 1. Chronic Systolic CHF: Nonischemic cardiomyopathy.  Admission 03/2020 for acute systolic CHF w/ low output requiring milrinone. Biventricular failure, echo 03/2020 EF < 20%, severe RV dysfunction, severe MR. LHC showed no significant coronary disease. CO good on milrinone, was able to wean off. 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  Patient has history of heavy ETOH, has now quit. Cause of cardiomyopathy likely noncompaction versus ETOH.  He has quit drinking. Echo in 06/2020 with EF 25%, normal RV, trivial MR.   - No volume overload, NYHA class I symptoms.   - Increase carvedilol to 25 mg BID.   - Continue Entresto 97/103 mg BID - Continue spironolactone 25 mg daily.  - Continue Farxiga 10 mg daily  - Needs repeat echo at followup in 2 months (06/2021), will refer to EP if EF  remains low for ICD evaluation.  Narrow QRS, not CRT candidate.  2. Atrial flutter: s/p DCCV 03/2020.  Suspect recurrence risk is high.  -  He is off amiodarone.  - Continue Eliquis.  - If/when referred to EP for ICD, would also like flutter ablation considered.  3. Stage 3 CKD: Scr 1.14 on last BMET 4.  ETOH abuse: Staying off ETOH.  6. Smoking: No longer smoking.    7. Mitral regurgitation: Severe on 03/2020 echo, echo in 06/2020 showed only trivial MR.   Follow up 2 months with Dr. Duard Brady, PharmD, BCPS, [facility], CPP Heart Failure Clinic Pharmacist [phone]

## 2021-04-19 ENCOUNTER — Other Ambulatory Visit (HOSPITAL_COMMUNITY): Payer: Self-pay | Admitting: Cardiology

## 2021-04-21 ENCOUNTER — Ambulatory Visit (HOSPITAL_COMMUNITY)
Admission: RE | Admit: 2021-04-21 | Discharge: 2021-04-21 | Disposition: A | Discharge status: Home / Self Care | Payer: 59 | Source: Ambulatory Visit | Attending: Cardiology | Admitting: Cardiology

## 2021-04-21 VITALS — BP 132/88 | HR 70 | Wt 196.0 lb

## 2021-04-21 DIAGNOSIS — N183 Chronic kidney disease, stage 3 unspecified: Secondary | ICD-10-CM | POA: Insufficient documentation

## 2021-04-21 DIAGNOSIS — Z87891 Personal history of nicotine dependence: Secondary | ICD-10-CM | POA: Diagnosis not present

## 2021-04-21 DIAGNOSIS — Z79899 Other long term (current) drug therapy: Secondary | ICD-10-CM | POA: Insufficient documentation

## 2021-04-21 DIAGNOSIS — I130 Hypertensive heart and chronic kidney disease with heart failure and stage 1 through stage 4 chronic kidney disease, or unspecified chronic kidney disease: Secondary | ICD-10-CM | POA: Insufficient documentation

## 2021-04-21 DIAGNOSIS — I4892 Unspecified atrial flutter: Secondary | ICD-10-CM | POA: Diagnosis not present

## 2021-04-21 DIAGNOSIS — I5022 Chronic systolic (congestive) heart failure: Secondary | ICD-10-CM | POA: Diagnosis present

## 2021-04-21 DIAGNOSIS — Z7901 Long term (current) use of anticoagulants: Secondary | ICD-10-CM | POA: Diagnosis not present

## 2021-04-21 DIAGNOSIS — I428 Other cardiomyopathies: Secondary | ICD-10-CM | POA: Insufficient documentation

## 2021-04-21 MED ORDER — CARVEDILOL 25 MG PO TABS: 25.0000 mg | ORAL_TABLET | Freq: Two times a day (BID) | ORAL | 3 refills | Status: DC

## 2021-04-21 MED ORDER — APIXABAN 5 MG PO TABS: 5.0000 mg | ORAL_TABLET | Freq: Two times a day (BID) | ORAL | 3 refills | Status: DC

## 2021-04-21 MED ORDER — DAPAGLIFLOZIN PROPANEDIOL 10 MG PO TABS: 10.0000 mg | ORAL_TABLET | Freq: Every day | ORAL | 3 refills | Status: DC

## 2021-04-21 NOTE — Patient Instructions (Signed)
It was a pleasure seeing you today!  MEDICATIONS: -We are changing your medications today -Increase carvedilol to 25 mg (1 tablet) twice daily.  -Call if you have questions about your medications.  NEXT APPOINTMENT: Return to clinic in 2 months with Dr. McLean.  In general, to take care of your heart failure: -Limit your fluid intake to 2 Liters (half-gallon) per day.   -Limit your salt intake to ideally 2-3 grams (2000-3000 mg) per day. -Weigh yourself daily and record, and bring that "weight diary" to your next appointment.  (Weight gain of 2-3 pounds in 1 day typically means fluid weight.) -The medications for your heart are to help your heart and help you live longer.   -Please contact us before stopping any of your heart medications.  Call the clinic at [phone] with questions or to reschedule future appointments.  

## 2021-07-02 ENCOUNTER — Other Ambulatory Visit (HOSPITAL_COMMUNITY): Payer: 59

## 2021-07-02 ENCOUNTER — Encounter (HOSPITAL_COMMUNITY): Payer: 59 | Admitting: Cardiology

## 2021-08-09 ENCOUNTER — Other Ambulatory Visit (HOSPITAL_COMMUNITY): Payer: Self-pay | Admitting: Cardiology

## 2021-09-03 ENCOUNTER — Encounter (HOSPITAL_COMMUNITY): Payer: Self-pay | Admitting: Cardiology

## 2021-09-03 ENCOUNTER — Ambulatory Visit (HOSPITAL_COMMUNITY): Admission: RE | Admit: 2021-09-03 | Payer: Self-pay | Source: Ambulatory Visit

## 2021-09-15 ENCOUNTER — Encounter (HOSPITAL_COMMUNITY): Payer: Self-pay | Admitting: *Deleted

## 2021-09-15 ENCOUNTER — Other Ambulatory Visit: Payer: Self-pay

## 2021-09-15 ENCOUNTER — Inpatient Hospital Stay (HOSPITAL_COMMUNITY)
Admission: EM | Admit: 2021-09-15 | Discharge: 2021-09-17 | DRG: 291 | Disposition: A | Discharge status: Home / Self Care | Payer: No Typology Code available for payment source | Attending: Internal Medicine | Admitting: Internal Medicine

## 2021-09-15 ENCOUNTER — Emergency Department (HOSPITAL_COMMUNITY): Payer: No Typology Code available for payment source

## 2021-09-15 DIAGNOSIS — Z20822 Contact with and (suspected) exposure to covid-19: Secondary | ICD-10-CM | POA: Diagnosis present

## 2021-09-15 DIAGNOSIS — I428 Other cardiomyopathies: Secondary | ICD-10-CM | POA: Diagnosis present

## 2021-09-15 DIAGNOSIS — N182 Chronic kidney disease, stage 2 (mild): Secondary | ICD-10-CM | POA: Diagnosis present

## 2021-09-15 DIAGNOSIS — I4892 Unspecified atrial flutter: Secondary | ICD-10-CM | POA: Diagnosis present

## 2021-09-15 DIAGNOSIS — Z87891 Personal history of nicotine dependence: Secondary | ICD-10-CM

## 2021-09-15 DIAGNOSIS — I4891 Unspecified atrial fibrillation: Secondary | ICD-10-CM | POA: Diagnosis present

## 2021-09-15 DIAGNOSIS — I472 Ventricular tachycardia, unspecified: Secondary | ICD-10-CM | POA: Diagnosis present

## 2021-09-15 DIAGNOSIS — Z79899 Other long term (current) drug therapy: Secondary | ICD-10-CM

## 2021-09-15 DIAGNOSIS — I5082 Biventricular heart failure: Secondary | ICD-10-CM | POA: Diagnosis present

## 2021-09-15 DIAGNOSIS — I471 Supraventricular tachycardia: Secondary | ICD-10-CM | POA: Diagnosis present

## 2021-09-15 DIAGNOSIS — I5023 Acute on chronic systolic (congestive) heart failure: Secondary | ICD-10-CM | POA: Diagnosis present

## 2021-09-15 DIAGNOSIS — E876 Hypokalemia: Secondary | ICD-10-CM | POA: Diagnosis present

## 2021-09-15 DIAGNOSIS — I4430 Unspecified atrioventricular block: Secondary | ICD-10-CM | POA: Diagnosis present

## 2021-09-15 DIAGNOSIS — I5041 Acute combined systolic (congestive) and diastolic (congestive) heart failure: Secondary | ICD-10-CM | POA: Diagnosis present

## 2021-09-15 DIAGNOSIS — Z8249 Family history of ischemic heart disease and other diseases of the circulatory system: Secondary | ICD-10-CM

## 2021-09-15 DIAGNOSIS — F1010 Alcohol abuse, uncomplicated: Secondary | ICD-10-CM | POA: Diagnosis present

## 2021-09-15 DIAGNOSIS — Z7901 Long term (current) use of anticoagulants: Secondary | ICD-10-CM

## 2021-09-15 DIAGNOSIS — I4729 Other ventricular tachycardia: Principal | ICD-10-CM

## 2021-09-15 DIAGNOSIS — J45909 Unspecified asthma, uncomplicated: Secondary | ICD-10-CM | POA: Diagnosis present

## 2021-09-15 DIAGNOSIS — I340 Nonrheumatic mitral (valve) insufficiency: Secondary | ICD-10-CM | POA: Diagnosis present

## 2021-09-15 DIAGNOSIS — I130 Hypertensive heart and chronic kidney disease with heart failure and stage 1 through stage 4 chronic kidney disease, or unspecified chronic kidney disease: Secondary | ICD-10-CM | POA: Diagnosis present

## 2021-09-15 DIAGNOSIS — I110 Hypertensive heart disease with heart failure: Secondary | ICD-10-CM | POA: Diagnosis not present

## 2021-09-15 DIAGNOSIS — I509 Heart failure, unspecified: Secondary | ICD-10-CM | POA: Diagnosis not present

## 2021-09-15 LAB — ETHANOL: Alcohol, Ethyl (B): 12 mg/dL — ABNORMAL HIGH (ref <10)

## 2021-09-15 LAB — PROTIME-INR
INR: 1.5 — ABNORMAL HIGH (ref 0.8–1.2)
Prothrombin Time: 17.6 seconds — ABNORMAL HIGH (ref 11.4–15.2)

## 2021-09-15 LAB — CBC WITH DIFFERENTIAL/PLATELET
Abs Immature Granulocytes: 0.01 10*3/uL (ref 0.00–0.07)
Basophils Absolute: 0.1 10*3/uL (ref 0.0–0.1)
Basophils Relative: 1 %
Eosinophils Absolute: 0.1 10*3/uL (ref 0.0–0.5)
Eosinophils Relative: 1 %
HCT: 43.1 % (ref 39.0–52.0)
Hemoglobin: 13.9 g/dL (ref 13.0–17.0)
Immature Granulocytes: 0 %
Lymphocytes Relative: 25 %
Lymphs Abs: 1.4 10*3/uL (ref 0.7–4.0)
MCH: 31.4 pg (ref 26.0–34.0)
MCHC: 32.3 g/dL (ref 30.0–36.0)
MCV: 97.5 fL (ref 80.0–100.0)
Monocytes Absolute: 0.5 10*3/uL (ref 0.1–1.0)
Monocytes Relative: 9 %
Neutro Abs: 3.6 10*3/uL (ref 1.7–7.7)
Neutrophils Relative %: 64 %
Platelets: 231 10*3/uL (ref 150–400)
RBC: 4.42 MIL/uL (ref 4.22–5.81)
RDW: 12 % (ref 11.5–15.5)
WBC: 5.6 10*3/uL (ref 4.0–10.5)
nRBC: 0 % (ref 0.0–0.2)

## 2021-09-15 LAB — COMPREHENSIVE METABOLIC PANEL
ALT: 12 U/L (ref 0–44)
AST: 14 U/L — ABNORMAL LOW (ref 15–41)
Albumin: 3.6 g/dL (ref 3.5–5.0)
Alkaline Phosphatase: 54 U/L (ref 38–126)
Anion gap: 14 (ref 5–15)
BUN: 12 mg/dL (ref 6–20)
CO2: 16 mmol/L — ABNORMAL LOW (ref 22–32)
Calcium: 8.9 mg/dL (ref 8.9–10.3)
Chloride: 111 mmol/L (ref 98–111)
Creatinine, Ser: 1.19 mg/dL (ref 0.61–1.24)
GFR, Estimated: >60 mL/min (ref >60)
Glucose, Bld: 80 mg/dL (ref 70–99)
Potassium: 3.9 mmol/L (ref 3.5–5.1)
Sodium: 141 mmol/L (ref 135–145)
Total Bilirubin: 1.3 mg/dL — ABNORMAL HIGH (ref 0.3–1.2)
Total Protein: 6.2 g/dL — ABNORMAL LOW (ref 6.5–8.1)

## 2021-09-15 LAB — TSH: TSH: 1.303 u[IU]/mL (ref 0.350–4.500)

## 2021-09-15 LAB — RESP PANEL BY RT-PCR (FLU A&B, COVID) ARPGX2
Influenza A by PCR: NEGATIVE
Influenza B by PCR: NEGATIVE
SARS Coronavirus 2 by RT PCR: NEGATIVE

## 2021-09-15 LAB — TROPONIN I (HIGH SENSITIVITY)
Troponin I (High Sensitivity): 12 ng/L (ref <18)
Troponin I (High Sensitivity): 26 ng/L — ABNORMAL HIGH (ref <18)

## 2021-09-15 LAB — BRAIN NATRIURETIC PEPTIDE: B Natriuretic Peptide: 66.2 pg/mL (ref 0.0–100.0)

## 2021-09-15 LAB — MAGNESIUM: Magnesium: 1.8 mg/dL (ref 1.7–2.4)

## 2021-09-15 LAB — LIPASE, BLOOD: Lipase: 29 U/L (ref 11–51)

## 2021-09-15 LAB — LACTIC ACID, PLASMA: Lactic Acid, Venous: 2 mmol/L (ref 0.5–1.9)

## 2021-09-15 MED ORDER — SODIUM CHLORIDE 0.9% FLUSH: 3.0000 mL | INTRAVENOUS | Status: DC | PRN

## 2021-09-15 MED ORDER — ACETAMINOPHEN 325 MG PO TABS: 650.0000 mg | ORAL_TABLET | ORAL | Status: DC | PRN

## 2021-09-15 MED ORDER — CARVEDILOL 25 MG PO TABS
25.0000 mg | ORAL_TABLET | Freq: Two times a day (BID) | ORAL | Status: DC
  Administered 2021-09-15 – 2021-09-17 (×4): 25 mg via ORAL
  Filled 2021-09-15: qty 1
  Filled 2021-09-15: qty 2
  Filled 2021-09-15 (×2): qty 1

## 2021-09-15 MED ORDER — AMIODARONE HCL IN DEXTROSE 360-4.14 MG/200ML-% IV SOLN
30.0000 mg/h | INTRAVENOUS | Status: DC
  Administered 2021-09-16 – 2021-09-17 (×4): 30 mg/h via INTRAVENOUS
  Filled 2021-09-15 (×3): qty 200

## 2021-09-15 MED ORDER — APIXABAN 5 MG PO TABS
5.0000 mg | ORAL_TABLET | Freq: Two times a day (BID) | ORAL | Status: DC
  Administered 2021-09-15 – 2021-09-17 (×4): 5 mg via ORAL
  Filled 2021-09-15 (×4): qty 1

## 2021-09-15 MED ORDER — ONDANSETRON HCL 4 MG/2ML IJ SOLN: 4.0000 mg | Freq: Four times a day (QID) | INTRAMUSCULAR | Status: DC | PRN

## 2021-09-15 MED ORDER — SODIUM CHLORIDE 0.9% FLUSH
3.0000 mL | Freq: Two times a day (BID) | INTRAVENOUS | Status: DC
  Administered 2021-09-16 – 2021-09-17 (×3): 3 mL via INTRAVENOUS

## 2021-09-15 MED ORDER — SODIUM CHLORIDE 0.9 % IV SOLN: 250.0000 mL | INTRAVENOUS | Status: DC | PRN

## 2021-09-15 MED ORDER — AMIODARONE LOAD VIA INFUSION
150.0000 mg | Freq: Once | INTRAVENOUS | Status: AC
  Administered 2021-09-15: 150 mg via INTRAVENOUS
  Filled 2021-09-15: qty 83.34

## 2021-09-15 MED ORDER — SACUBITRIL-VALSARTAN 97-103 MG PO TABS
1.0000 | ORAL_TABLET | Freq: Two times a day (BID) | ORAL | Status: DC
  Administered 2021-09-15 – 2021-09-17 (×4): 1 via ORAL
  Filled 2021-09-15 (×5): qty 1

## 2021-09-15 MED ORDER — SPIRONOLACTONE 25 MG PO TABS
25.0000 mg | ORAL_TABLET | Freq: Every day | ORAL | Status: DC
  Administered 2021-09-16 – 2021-09-17 (×2): 25 mg via ORAL
  Filled 2021-09-15 (×3): qty 1

## 2021-09-15 MED ORDER — DAPAGLIFLOZIN PROPANEDIOL 10 MG PO TABS
10.0000 mg | ORAL_TABLET | Freq: Every day | ORAL | Status: DC
  Administered 2021-09-15 – 2021-09-17 (×3): 10 mg via ORAL
  Filled 2021-09-15 (×3): qty 1

## 2021-09-15 MED ORDER — FUROSEMIDE 10 MG/ML IJ SOLN
40.0000 mg | Freq: Four times a day (QID) | INTRAMUSCULAR | Status: AC
  Administered 2021-09-15 – 2021-09-16 (×2): 40 mg via INTRAVENOUS
  Filled 2021-09-15 (×2): qty 4

## 2021-09-15 MED ORDER — AMIODARONE HCL IN DEXTROSE 360-4.14 MG/200ML-% IV SOLN
60.0000 mg/h | INTRAVENOUS | Status: AC
  Administered 2021-09-15 – 2021-09-16 (×2): 60 mg/h via INTRAVENOUS
  Filled 2021-09-15 (×2): qty 200

## 2021-09-15 NOTE — ED Provider Notes (Signed)
? ?Emergency Department Provider Note ? ? ?I have reviewed the triage vital signs and the nursing notes. ? ? ?HISTORY ? ?Chief Complaint ?Tachycardia ? ? ?HPI ?Bryan Guerrero is a 51 y.o. male with past medical history of alcohol abuse with cardiomyopathy (EF 20%) along with HTN presents to the emergency department for evaluation of generalized weakness.  Symptoms were present upon waking this morning.  Patient denies any chest pain or pressure.  He states perhaps slight palpitations but mainly noticed symptoms when he was up and trying to walk around which ultimately prompted him to call EMS.  EMS arrived to find the patient in SVT initially.  They gave 6 mg of adenosine with no improvement followed by 12 mg.  Patient then, per the report, appeared to switch into A-fib with RVR.  They then noted a brief, around 1 minute, run of nonsustained ventricular tachycardia and then patient returned to normal sinus rhythm.  He did not require defibrillation or chest compressions.  They considered starting amiodarone but then held when he converted back to NSR. On arrival patient reports feeling well.  ? ? ?Past Medical History:  ?Diagnosis Date  ? Alcohol abuse   ? Asthma   ? Cardiomyopathy (HCC)   ? Dyspnea   ? Hypertension   ? ? ?Review of Systems ? ?Constitutional: No fever/chills ?Eyes: No visual changes. ?ENT: No sore throat. ?Cardiovascular: Denies chest pain. Positive weakness and palpitations.  ?Respiratory: Denies shortness of breath. ?Gastrointestinal: No abdominal pain.  No nausea, no vomiting.  No diarrhea.  No constipation. ?Genitourinary: Negative for dysuria. ?Musculoskeletal: Negative for back pain. ?Skin: Negative for rash. ?Neurological: Negative for headaches, focal weakness or numbness. ? ? ?____________________________________________ ? ? ?PHYSICAL EXAM: ? ?VITAL SIGNS: ?Vitals:  ? 09/15/21 1845 09/15/21 2045  ?BP: 122/79 (!) 133/98  ?Pulse: (!) 48 (!) 53  ?Resp: 13 12  ?Temp:    ?SpO2: 99% 99%   ? ? ?Constitutional: Alert and oriented. Well appearing and in no acute distress. ?Eyes: Conjunctivae are normal.  ?Head: Atraumatic. ?Nose: No congestion/rhinnorhea. ?Mouth/Throat: Mucous membranes are moist.   ?Neck: No stridor.   ?Cardiovascular: Tachycardia. Good peripheral circulation. Grossly normal heart sounds.   ?Respiratory: Normal respiratory effort.  No retractions. Lungs CTAB. ?Gastrointestinal: Soft and nontender. No distention.  ?Musculoskeletal: No lower extremity tenderness nor edema. No gross deformities of extremities. ?Neurologic:  Normal speech and language. No gross focal neurologic deficits are appreciated.  ?Skin:  Skin is warm, dry and intact. No rash noted. ? ? ?____________________________________________ ?  ?LABS ?(all labs ordered are listed, but only abnormal results are displayed) ? ?Labs Reviewed  ?COMPREHENSIVE METABOLIC PANEL - Abnormal; Notable for the following components:  ?    Result Value  ? CO2 16 (*)   ? Total Protein 6.2 (*)   ? AST 14 (*)   ? Total Bilirubin 1.3 (*)   ? All other components within normal limits  ?PROTIME-INR - Abnormal; Notable for the following components:  ? Prothrombin Time 17.6 (*)   ? INR 1.5 (*)   ? All other components within normal limits  ?ETHANOL - Abnormal; Notable for the following components:  ? Alcohol, Ethyl (B) 12 (*)   ? All other components within normal limits  ?LACTIC ACID, PLASMA - Abnormal; Notable for the following components:  ? Lactic Acid, Venous 2.0 (*)   ? All other components within normal limits  ?TROPONIN I (HIGH SENSITIVITY) - Abnormal; Notable for the following components:  ? Troponin I (  High Sensitivity) 26 (*)   ? All other components within normal limits  ?RESP PANEL BY RT-PCR (FLU A&B, COVID) ARPGX2  ?LIPASE, BLOOD  ?CBC WITH DIFFERENTIAL/PLATELET  ?MAGNESIUM  ?TSH  ?BRAIN NATRIURETIC PEPTIDE  ?RAPID URINE DRUG SCREEN, HOSP PERFORMED  ?LACTIC ACID, PLASMA  ?HIV ANTIBODY (ROUTINE TESTING W REFLEX)  ?BASIC METABOLIC PANEL   ?TROPONIN I (HIGH SENSITIVITY)  ? ?____________________________________________ ? ?EKG ? ? EKG Interpretation ? ?Date/Time:  Wednesday September 15 2021 17:00:55 EDT ?Ventricular Rate:  136 ?PR Interval:    ?QRS Duration: 105 ?QT Interval:  351 ?QTC Calculation: 525 ?R Axis:   63 ?Text Interpretation: Atrial flutter Ventricular bigeminy Probable left ventricular hypertrophy Anterior ST elevation, probably due to LVH Prolonged QT interval Confirmed by Alona Bene [phone]) on 09/15/2021 5:07:51 PM ?  ? ?  ? ? ?____________________________________________ ? ?RADIOLOGY ? ?DG Chest Portable 1 View ? ?Result Date: 09/15/2021 ?CLINICAL DATA:  Chest pain EXAM: PORTABLE CHEST 1 VIEW COMPARISON:  Chest radiograph dated March 16, 2020 FINDINGS: The heart size and mediastinal contours are within normal limits. Both lungs are clear. The visualized skeletal structures are unremarkable. IMPRESSION: No active disease. Electronically Signed   By: Larose Hires D.O.   On: 09/15/2021 17:44   ? ?____________________________________________ ? ? ?PROCEDURES ? ?Procedure(s) performed:  ? ?Procedures ? ?CRITICAL CARE ?Performed by: Maia Plan ?Total critical care time: 35 minutes ?Critical care time was exclusive of separately billable procedures and treating other patients. ?Critical care was necessary to treat or prevent imminent or life-threatening deterioration. ?Critical care was time spent personally by me on the following activities: development of treatment plan with patient and/or surrogate as well as nursing, discussions with consultants, evaluation of patient's response to treatment, examination of patient, obtaining history from patient or surrogate, ordering and performing treatments and interventions, ordering and review of laboratory studies, ordering and review of radiographic studies, pulse oximetry and re-evaluation of patient's condition. ? ?Alona Bene, MD ?Emergency  Medicine ? ?____________________________________________ ? ? ?INITIAL IMPRESSION / ASSESSMENT AND PLAN / ED COURSE ? ?Pertinent labs & imaging results that were available during my care of the patient were reviewed by me and considered in my medical decision making (see chart for details). ?  ?This patient is Presenting for Evaluation of palpitations, which does require a range of treatment options, and is a complaint that involves a high risk of morbidity and mortality. ? ?The Differential Diagnoses include SVT, NSVT, A fib with RVR, ACS, PE, worsening CHF. ? ?Critical Interventions-  ?  ?Medications  ?amiodarone (NEXTERONE PREMIX) 360-4.14 MG/200ML-% (1.8 mg/mL) IV infusion (60 mg/hr Intravenous New Bag/Given 09/15/21 2043)  ?amiodarone (NEXTERONE PREMIX) 360-4.14 MG/200ML-% (1.8 mg/mL) IV infusion (has no administration in time range)  ?carvedilol (COREG) tablet 25 mg (25 mg Oral Given 09/15/21 2209)  ?sacubitril-valsartan (ENTRESTO) 97-103 mg per tablet (1 tablet Oral Given 09/15/21 2210)  ?spironolactone (ALDACTONE) tablet 25 mg (has no administration in time range)  ?dapagliflozin propanediol (FARXIGA) tablet 10 mg (10 mg Oral Given 09/15/21 2210)  ?apixaban (ELIQUIS) tablet 5 mg (5 mg Oral Given 09/15/21 2209)  ?sodium chloride flush (NS) 0.9 % injection 3 mL (has no administration in time range)  ?sodium chloride flush (NS) 0.9 % injection 3 mL (has no administration in time range)  ?0.9 %  sodium chloride infusion (has no administration in time range)  ?acetaminophen (TYLENOL) tablet 650 mg (has no administration in time range)  ?ondansetron (ZOFRAN) injection 4 mg (has no administration in time range)  ?  furosemide (LASIX) injection 40 mg (has no administration in time range)  ?amiodarone (NEXTERONE) 1.8 mg/mL load via infusion 150 mg (150 mg Intravenous Bolus from Bag 09/15/21 2046)  ? ? ?Reassessment after intervention: HR improved on Amiodarone. No NSVT here in the ED.  ? ? ?I did obtain Additional Historical  Information from EMS. ? ?I decided to review pertinent External Data, and in summary last ECHO from 2022 shows EF ~20%. ?  ?Clinical Laboratory Tests Ordered, included troponin is normal to mildly elevated at 26.  COVID and flu are negative.  BNP of 66.  No acute kidne

## 2021-09-15 NOTE — ED Notes (Signed)
Nasal 02 at 2 liters on arrival to the ed ?

## 2021-09-15 NOTE — ED Notes (Signed)
Ems gave the pt 230 nss on the way here ?

## 2021-09-15 NOTE — Progress Notes (Signed)
?  Amiodarone Drug - Drug Interaction Consult Note ? ?Recommendations: ?Continue current medications. Monitor heart rate, blood pressure, and electrolytes.  ? ?Amiodarone is metabolized by the cytochrome P450 system and therefore has the potential to cause many drug interactions. Amiodarone has an average plasma half-life of 50 days (range 20 to 100 days).  ? ?There is potential for drug interactions to occur several weeks or months after stopping treatment and the onset of drug interactions may be slow after initiating amiodarone.  ? ?[]  Statins: Increased risk of myopathy. Simvastatin- restrict dose to 20mg  daily. ?Other statins: counsel patients to report any muscle pain or weakness immediately. ? ?[x]  Anticoagulants: Amiodarone can increase anticoagulant effect. Consider warfarin dose reduction. Patients should be monitored closely and the dose of anticoagulant altered accordingly, remembering that amiodarone levels take several weeks to stabilize.  ? Patient is currently on apixaban 5 mg PO BID  ? ?[]  Antiepileptics: Amiodarone can increase plasma concentration of phenytoin, the dose should be reduced. Note that small changes in phenytoin dose can result in large changes in levels. Monitor patient and counsel on signs of toxicity. ? ?[x]  Beta blockers: increased risk of bradycardia, AV block and myocardial depression. Sotalol - avoid concomitant use. ? Patient is on carvedilol 25 mg PO BID ? ?[]   Calcium channel blockers (diltiazem and verapamil): increased risk of bradycardia, AV block and myocardial depression. ? ?[]   Cyclosporine: Amiodarone increases levels of cyclosporine. Reduced dose of cyclosporine is recommended. ? ?[]  Digoxin dose should be halved when amiodarone is started. ? ?[x]  Diuretics: increased risk of cardiotoxicity if hypokalemia occurs. ? Patient is on furosemide 40 mg IV q6h ? ?[]  Oral hypoglycemic agents (glyburide, glipizide, glimepiride): increased risk of hypoglycemia. Patient's glucose  levels should be monitored closely when initiating amiodarone therapy.  ? ?[]  Drugs that prolong the QT interval:  Torsades de pointes risk may be increased with concurrent use - avoid if possible.  Monitor QTc, also keep magnesium/potassium WNL if concurrent therapy can't be avoided. ? Antibiotics: e.g. fluoroquinolones, erythromycin. ? Antiarrhythmics: e.g. quinidine, procainamide, disopyramide, sotalol. ? Antipsychotics: e.g. phenothiazines, haloperidol. ?  Lithium, tricyclic antidepressants, and methadone. ? ?Thank You,  ? , PharmD ?[id number] Acute Care Pharmacy Resident  ?Phone: [phone] ?09/15/2021  10:12 PM ? ?Please check [email or web address] for unit-specific pharmacy phone numbers. ? ? ? ? ?

## 2021-09-15 NOTE — ED Notes (Signed)
The pt has an irregular pulse 

## 2021-09-15 NOTE — ED Triage Notes (Signed)
The pt arrived by [facility] from home  pt not feeling well since this am  eweakness.  Ems found the pt to be in a tachey rate of 160  he was given by them  adenocard 6mg  then 12 mg  pt converted    into af  then vt  then back to af now sinus tachtcardia  no chest pain  no hx of this only chf  a and o x 4 ?

## 2021-09-15 NOTE — ED Notes (Signed)
Heart rate is slower but still sl irregular ?

## 2021-09-16 ENCOUNTER — Other Ambulatory Visit (HOSPITAL_COMMUNITY): Payer: Self-pay

## 2021-09-16 ENCOUNTER — Inpatient Hospital Stay (HOSPITAL_COMMUNITY): Payer: No Typology Code available for payment source | Admitting: Anesthesiology

## 2021-09-16 ENCOUNTER — Encounter (HOSPITAL_COMMUNITY): Payer: Self-pay | Admitting: Student

## 2021-09-16 ENCOUNTER — Encounter (HOSPITAL_COMMUNITY)
Admission: EM | Disposition: A | Discharge status: Home / Self Care | Payer: Self-pay | Source: Home / Self Care | Attending: Cardiology

## 2021-09-16 DIAGNOSIS — I5023 Acute on chronic systolic (congestive) heart failure: Secondary | ICD-10-CM

## 2021-09-16 DIAGNOSIS — I4892 Unspecified atrial flutter: Secondary | ICD-10-CM

## 2021-09-16 DIAGNOSIS — I509 Heart failure, unspecified: Secondary | ICD-10-CM

## 2021-09-16 DIAGNOSIS — I110 Hypertensive heart disease with heart failure: Secondary | ICD-10-CM

## 2021-09-16 HISTORY — PX: CARDIOVERSION: SHX1299

## 2021-09-16 LAB — BASIC METABOLIC PANEL
Anion gap: 7 (ref 5–15)
BUN: 13 mg/dL (ref 6–20)
CO2: 24 mmol/L (ref 22–32)
Calcium: 8.9 mg/dL (ref 8.9–10.3)
Chloride: 107 mmol/L (ref 98–111)
Creatinine, Ser: 1.25 mg/dL — ABNORMAL HIGH (ref 0.61–1.24)
GFR, Estimated: >60 mL/min (ref >60)
Glucose, Bld: 81 mg/dL (ref 70–99)
Potassium: 3.7 mmol/L (ref 3.5–5.1)
Sodium: 138 mmol/L (ref 135–145)

## 2021-09-16 LAB — RAPID URINE DRUG SCREEN, HOSP PERFORMED
Amphetamines: NOT DETECTED
Barbiturates: NOT DETECTED
Benzodiazepines: NOT DETECTED
Cocaine: NOT DETECTED
Opiates: NOT DETECTED
Tetrahydrocannabinol: POSITIVE — AB

## 2021-09-16 LAB — HIV ANTIBODY (ROUTINE TESTING W REFLEX): HIV Screen 4th Generation wRfx: NONREACTIVE

## 2021-09-16 SURGERY — CARDIOVERSION
Anesthesia: General

## 2021-09-16 NOTE — Plan of Care (Signed)

## 2021-09-16 NOTE — Progress Notes (Signed)
Heart Failure Navigator Progress Note ? ?Assessed for Heart & Vascular TOC clinic readiness.  ?Patient does not meet criteria due to established with Advanced Heart Failure team..  ? ? ? ?Rhae Hammock, BSN, RN ?Heart Failure Nurse Navigator ?[phone]   ?

## 2021-09-16 NOTE — Transfer of Care (Signed)
Immediate Anesthesia Transfer of Care Note ? ?Patient: Bryan Guerrero ? ?Procedure(s) Performed: CARDIOVERSION ? ?Patient Location: PACU and Endoscopy Unit ? ?Anesthesia Type:General ? ?Level of Consciousness: drowsy, patient cooperative and responds to stimulation ? ?Airway & Oxygen Therapy: Patient Spontanous Breathing ? ?Post-op Assessment: Report given to RN and Post -op Vital signs reviewed and stable ? ?Post vital signs: Reviewed and stable ? ?Last Vitals:  ?Vitals Value Taken Time  ?BP    ?Temp    ?Pulse    ?Resp    ?SpO2    ? ? ?Last Pain:  ?Vitals:  ? 09/16/21 1258  ?TempSrc: Oral  ?PainSc: 0-No pain  ?   ? ?  ? ?Complications: No notable events documented. ?

## 2021-09-16 NOTE — Progress Notes (Signed)
Patient back from cardioversion. VS being obtained.  ?

## 2021-09-16 NOTE — ED Notes (Signed)
Placed pt on hospital bed for comfort. 

## 2021-09-16 NOTE — H&P (Signed)
?Cardiology Admission History and Physical:  ? ?Patient ID: Bryan Guerrero ?MRN: [id number]; DOB: April 25, 1971  ? ?Admission date: 09/15/2021 ? ?PCP:  Storm Frisk, MD ?  ?[facility] Providers ?Cardiologist:  Thurmon Fair, MD  ?Electrophysiologist:  Lanier Prude, MD  ?Advanced Heart Failure:  Marca Ancona, MD     ? ? ?Chief Complaint:  Arrhythmia ? ?Patient Profile:  ? ?Bryan Guerrero is a 51 y.o. 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 ? ?History of Present Illness:  ? ?Bryan Guerrero is a 51 year old male with past medical history as above who presents for evaluation of heart failure and atrial arrhythmia.  He was last admitted in 10/21 with new onset atrial flutter and heart failure exacerbation.  He underwent cardioversion and has been medically optimized in the otupatient setting without recurrence.  During 2021 he required milrinone support for decongestion.  He also underwent cMRI with some suggestion of non-compaction.  He was last seen by Dr. Shirlee Latch in late 2022 at which time he was continued on his GDMT and doing pretty well and abstinent from [facility]. ? ?He's had two days of worsening shortness of breath with exertion, lethargy, and dizziness.  This afternoon after returning from work he felt lightheaded and so weak that he couldn't stand up and EMS was called.  He was in a rapid short RP tachcyardia, favor AVNRT, that responded to diltiazem and broke briefly to [facility].  This subsequently organized to AFL and he remains in AFL with variable AV block and frequent PVCs.  He feels better in flutter. ? ?Serum etOH is positive.  He admits to resuming etOH use but less than before; a couple beers a day.  Discussed necessity of absolute abstinence with him. ? ?He's been adherent to medications and taken his eliquis religiously. ? ?Past Medical History:  ?Diagnosis Date  ? Alcohol abuse   ? Asthma   ? Cardiomyopathy (HCC)    ? Dyspnea   ? Hypertension   ? ? ?Past Surgical History:  ?Procedure Laterality Date  ? CARDIOVERSION N/A 03/17/2020  ? Procedure: CARDIOVERSION;  Surgeon: Lewayne Bunting, MD;  Location: [facility] ENDOSCOPY;  Service: Cardiovascular;  Laterality: N/A;  ? RIGHT/LEFT HEART CATH AND CORONARY ANGIOGRAPHY N/A 03/19/2020  ? Procedure: RIGHT/LEFT HEART CATH AND CORONARY ANGIOGRAPHY;  Surgeon: Laurey Morale, MD;  Location: [facility] INVASIVE CV LAB;  Service: Cardiovascular;  Laterality: N/A;  ? TEE WITHOUT CARDIOVERSION  10/052021  ? TEE WITHOUT CARDIOVERSION N/A 03/17/2020  ? Procedure: TRANSESOPHAGEAL ECHOCARDIOGRAM (TEE);  Surgeon: Lewayne Bunting, MD;  Location: [facility] ENDOSCOPY;  Service: Cardiovascular;  Laterality: N/A;  ?  ? ?Medications Prior to Admission: ?Prior to Admission medications   ?Medication Sig Start Date End Date Taking? Authorizing Provider  ?apixaban (ELIQUIS) 5 MG TABS tablet Take 1 tablet (5 mg total) by mouth 2 (two) times daily. 04/21/21  Yes Laurey Morale, MD  ?carvedilol (COREG) 25 MG tablet Take 1 tablet (25 mg total) by mouth 2 (two) times daily. 04/21/21  Yes Laurey Morale, MD  ?dapagliflozin propanediol (FARXIGA) 10 MG TABS tablet Take 1 tablet (10 mg total) by mouth daily. 04/21/21  Yes Laurey Morale, MD  ?Multiple Vitamin (MULTIVITAMIN WITH MINERALS) TABS tablet Take 1 tablet by mouth daily.   Yes [provider]  ?sacubitril-valsartan (ENTRESTO) 97-103 MG Take 1 tablet by mouth 2 (two) times daily. 03/31/21  Yes  Laurey Morale, MD  ?spironolactone (ALDACTONE) 25 MG tablet Take 1 tablet by mouth once daily 08/09/21  Yes Laurey Morale, MD  ?  ? ?Allergies:   No Known Allergies ? ?Social History:   ?Social History  ? ?Socioeconomic History  ? Marital status: Single  ?  Spouse name: Not on file  ? Number of children: Not on file  ? Years of education: Not on file  ? Highest education level: Not on file  ?Occupational History  ? Not on file  ?Tobacco Use  ? Smoking status: Former  ?   Types: Cigars  ?  Quit date: 07/14/2018  ?  Years since quitting: 3.1  ? Smokeless tobacco: Never  ?Vaping Use  ? Vaping Use: Never used  ?Substance and Sexual Activity  ? Alcohol use: Not Currently  ?  Comment: quit 03/09/20  ? Drug use: Not Currently  ?  Types: Marijuana  ?  Comment: quit 03/09/20  ? Sexual activity: Not Currently  ?Other Topics Concern  ? Not on file  ?Social History Narrative  ? Not on file  ? ?Social Determinants of Health  ? ?Financial Resource Strain: Not on file  ?Food Insecurity: Not on file  ?Transportation Needs: Not on file  ?Physical Activity: Not on file  ?Stress: Not on file  ?Social Connections: Not on file  ?Intimate Partner Violence: Not on file  ?  ?Family History: ?The patient's family history includes Hypertension in his father. There is no history of Cancer or Diabetes.   ? ?ROS:  ?Please see the history of present illness.  ?All other ROS reviewed and negative.    ? ?Physical Exam/Data:  ? ?Vitals:  ? 09/15/21 2200 09/15/21 2230 09/15/21 2300 09/15/21 2315  ?BP: (!) 138/97 (!) 136/106 110/70 123/82  ?Pulse: 88 92 61 90  ?Resp: 10 16 18 18   ?Temp:      ?SpO2: 100% 100% 99% 96%  ?Weight:      ?Height:      ? ? ?Intake/Output Summary (Last 24 hours) at 09/16/2021 0040 ?Last data filed at 09/15/2021 2334 ?Gross per 24 hour  ?Intake --  ?Output 900 ml  ?Net -900 ml  ? ? ?  09/15/2021  ?  5:02 PM 04/21/2021  ? 12:42 PM 03/31/2021  ?  9:24 AM  ?Last 3 Weights  ?Weight (lbs) 195 lb 15.8 oz 196 lb 200 lb 9.6 oz  ?Weight (kg) 88.9 kg 88.905 kg 90.992 kg  ?   ?Body mass index is 26.58 kg/m?.  ?General:  Well nourished, well developed, in no acute distres ?HEENT: normal ?Neck:  JVP 12 cm H20 ?Vascular: No carotid bruits; Distal pulses 2+ bilaterally   ?Cardiac:  normal S1, S2; RRR; no murmur ?Lungs:  clear to auscultation bilaterally, no wheezing, rhonchi or rales  ?Abd: soft, nontender, no hepatomegaly  ?Ext: no edema ?Musculoskeletal:  No deformities, BUE and BLE strength normal and equal ?Skin:  warm and dry  ?Neuro:  CNs 2-12 intact, no focal abnormalities noted ?Psych:  Normal affect  ? ? ?EKG:  The ECG that was done AFL variable conduciton with frequent PVCs on personal review ? ?Relevant CV Studies: ?LCx 30%, RCA, LM, LAD angiographically normal ?PAC 2021 on 0.25 milrinone 4, 34/8, 18, CI 3.0 PVR 1.1 ? ?MRI 10/21 LVEF 13%, EDV 422, Co 4.4, hypertrabeculation, RV dilated and dysfunctional, basal septal LGE, RV insertion LGE, moderate MR ? ?Echo 1/22 LVEF 25%, global, trivial MR ? ?Laboratory Data: ? ?High Sensitivity Troponin:   ?  Recent Labs  ?Lab 09/15/21 ?1706 09/15/21 ?1803  ?TROPONINIHS 12 26*  ?    ?Chemistry ?Recent Labs  ?Lab 09/15/21 ?1706  ?NA 141  ?K 3.9  ?CL 111  ?CO2 16*  ?GLUCOSE 80  ?BUN 12  ?CREATININE 1.19  ?CALCIUM 8.9  ?MG 1.8  ?GFRNONAA >60  ?ANIONGAP 14  ?  ?Recent Labs  ?Lab 09/15/21 ?1706  ?PROT 6.2*  ?ALBUMIN 3.6  ?AST 14*  ?ALT 12  ?ALKPHOS 54  ?BILITOT 1.3*  ? ?Lipids No results for input(s): CHOL, TRIG, HDL, LABVLDL, LDLCALC, CHOLHDL in the last 168 hours. ?Hematology ?Recent Labs  ?Lab 09/15/21 ?1706  ?WBC 5.6  ?RBC 4.42  ?HGB 13.9  ?HCT 43.1  ?MCV 97.5  ?MCH 31.4  ?MCHC 32.3  ?RDW 12.0  ?PLT 231  ? ?Thyroid  ?Recent Labs  ?Lab 09/15/21 ?1706  ?TSH 1.303  ? ?BNP ?Recent Labs  ?Lab 09/15/21 ?2106  ?BNP 66.2  ?  ?DDimer No results for input(s): DDIMER in the last 168 hours. ? ? ?Radiology/Studies:  ?DG Chest Portable 1 View ? ?Result Date: 09/15/2021 ?CLINICAL DATA:  Chest pain EXAM: PORTABLE CHEST 1 VIEW COMPARISON:  Chest radiograph dated March 16, 2020 FINDINGS: The heart size and mediastinal contours are within normal limits. Both lungs are clear. The visualized skeletal structures are unremarkable. IMPRESSION: No active disease. Electronically Signed   By: Larose Hires D.O.   On: 09/15/2021 17:44   ? ? ?Assessment and Plan:  ? 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  He has modestly elevating filling pressures.  Unfortunately he's relapsed and is drinking again. ? ?Favor rhythm control. ? ?#AFL ?#AVNRT ?- Amiodarone started in ED; can

## 2021-09-16 NOTE — Anesthesia Preprocedure Evaluation (Signed)
Anesthesia Evaluation  ?Patient identified by MRN, date of birth, ID band ?Patient awake ? ? ? ?Reviewed: ?Allergy & Precautions, NPO status , Patient's Chart, lab work & pertinent test results ? ?Airway ?Mallampati: II ? ?TM Distance: >3 FB ?Neck ROM: Full ? ? ? Dental ?no notable dental hx. ? ?  ?Pulmonary ?asthma , former smoker,  ?  ?Pulmonary exam normal ?breath sounds clear to auscultation ? ? ? ? ? ? Cardiovascular ?hypertension, Pt. on medications ?+CHF  ?Normal cardiovascular exam+ dysrhythmias  ?Rhythm:Regular Rate:Normal ? ? ?  ?Neuro/Psych ?negative neurological ROS ? negative psych ROS  ? GI/Hepatic ?negative GI ROS, Neg liver ROS,   ?Endo/Other  ?negative endocrine ROS ? Renal/GU ?negative Renal ROS  ?negative genitourinary ?  ?Musculoskeletal ?negative musculoskeletal ROS ?(+)  ? Abdominal ?  ?Peds ?negative pediatric ROS ?(+)  Hematology ?negative hematology ROS ?(+)   ?Anesthesia Other Findings ? ? Reproductive/Obstetrics ?negative OB ROS ? ?  ? ? ? ? ? ? ? ? ? ? ? ? ? ?  ?  ? ? ? ? ? ? ? ? ?Anesthesia Physical ?Anesthesia Plan ? ?ASA: 3 ? ?Anesthesia Plan: General  ? ?Post-op Pain Management: Minimal or no pain anticipated  ? ?Induction: Intravenous ? ?PONV Risk Score and Plan: 2 and Ondansetron, Midazolam and Treatment may vary due to age or medical condition ? ?Airway Management Planned: Mask ? ?Additional Equipment:  ? ?Intra-op Plan:  ? ?Post-operative Plan:  ? ?Informed Consent: I have reviewed the patients History and Physical, chart, labs and discussed the procedure including the risks, benefits and alternatives for the proposed anesthesia with the patient or authorized representative who has indicated his/her understanding and acceptance.  ? ? ? ?Dental advisory given ? ?Plan Discussed with: CRNA ? ?Anesthesia Plan Comments:   ? ? ? ? ? ? ?Anesthesia Quick Evaluation ? ?

## 2021-09-16 NOTE — Progress Notes (Addendum)
? ? Advanced Heart Failure Rounding Note ? ?PCP-Cardiologist: Sanda Klein, MD  ? ?Subjective:   ? ?Admitted with A fib RVR. Started on amio drip.  ? ?Feels ok. Denies chest pain/shortness of breath.  ? ?Objective:   ?Weight Range: ?88.9 kg ?Body mass index is 26.58 kg/m?.  ? ?Vital Signs:   ?Temp:  [98.8 ?F (37.1 ?C)-99.4 ?F (37.4 ?C)] 98.8 ?F (37.1 ?C) (04/06 0805) ?Pulse Rate:  [44-133] 88 (04/06 0805) ?Resp:  [9-22] 20 (04/06 0805) ?BP: (91-166)/(62-144) 109/91 (04/06 0805) ?SpO2:  [96 %-100 %] 99 % (04/06 0805) ?Weight:  [88.9 kg] 88.9 kg (04/05 1702) ?Last BM Date : 09/15/21 ? ?Weight change: ?Filed Weights  ? 09/15/21 1702  ?Weight: 88.9 kg  ? ? ?Intake/Output:  ? ?Intake/Output Summary (Last 24 hours) at 09/16/2021 0826 ?Last data filed at 09/15/2021 2334 ?Gross per 24 hour  ?Intake --  ?Output 900 ml  ?Net -900 ml  ?  ? ? ?Physical Exam  ?  ?General:  Well appearing. No resp difficulty ?HEENT: Normal ?Neck: Supple. JVP 5-6 . Carotids 2+ bilat; no bruits. No lymphadenopathy or thyromegaly appreciated. ?Cor: PMI nondisplaced. Irregular rate & rhythm. No rubs, gallops or murmurs. ?Lungs: Clear ?Abdomen: Soft, nontender, nondistended. No hepatosplenomegaly. No bruits or masses. Good bowel sounds. ?Extremities: No cyanosis, clubbing, rash, edema ?Neuro: Alert & orientedx3, cranial nerves grossly intact. moves all 4 extremities w/o difficulty. Affect pleasant ? ? ?Telemetry  ? ?A flutter 80-90s  ? ?EKG  ?  ? ? ?Labs  ?  ?CBC ?Recent Labs  ?  09/15/21 ?1706  ?WBC 5.6  ?NEUTROABS 3.6  ?HGB 13.9  ?HCT 43.1  ?MCV 97.5  ?PLT 231  ? ?Basic Metabolic Panel ?Recent Labs  ?  09/15/21 ?1706 09/16/21 ?0145  ?NA 141 138  ?K 3.9 3.7  ?CL 111 107  ?CO2 16* 24  ?GLUCOSE 80 81  ?BUN 12 13  ?CREATININE 1.19 1.25*  ?CALCIUM 8.9 8.9  ?MG 1.8  --   ? ?Liver Function Tests ?Recent Labs  ?  09/15/21 ?1706  ?AST 14*  ?ALT 12  ?ALKPHOS 54  ?BILITOT 1.3*  ?PROT 6.2*  ?ALBUMIN 3.6  ? ?Recent Labs  ?  09/15/21 ?1706  ?LIPASE 29  ? ?Cardiac  Enzymes ?No results for input(s): CKTOTAL, CKMB, CKMBINDEX, TROPONINI in the last 72 hours. ? ?BNP: ?BNP (last 3 results) ?Recent Labs  ?  09/15/21 ?2106  ?BNP 66.2  ? ? ?ProBNP (last 3 results) ?No results for input(s): PROBNP in the last 8760 hours. ? ? ?D-Dimer ?No results for input(s): DDIMER in the last 72 hours. ?Hemoglobin A1C ?No results for input(s): HGBA1C in the last 72 hours. ?Fasting Lipid Panel ?No results for input(s): CHOL, HDL, LDLCALC, TRIG, CHOLHDL, LDLDIRECT in the last 72 hours. ?Thyroid Function Tests ?Recent Labs  ?  09/15/21 ?1706  ?TSH 1.303  ? ? ?Other results: ? ? ?Imaging  ? ? ?DG Chest Portable 1 View ? ?Result Date: 09/15/2021 ?CLINICAL DATA:  Chest pain EXAM: PORTABLE CHEST 1 VIEW COMPARISON:  Chest radiograph dated March 16, 2020 FINDINGS: The heart size and mediastinal contours are within normal limits. Both lungs are clear. The visualized skeletal structures are unremarkable. IMPRESSION: No active disease. Electronically Signed   By: Keane Police D.O.   On: 09/15/2021 17:44   ? ? ?Medications:   ? ? ?Scheduled Medications: ? apixaban  5 mg Oral BID  ? carvedilol  25 mg Oral BID  ? dapagliflozin propanediol  10 mg  Oral Daily  ? sacubitril-valsartan  1 tablet Oral BID  ? sodium chloride flush  3 mL Intravenous Q12H  ? spironolactone  25 mg Oral Daily  ? ? ?Infusions: ? sodium chloride    ? amiodarone 30 mg/hr (09/16/21 0218)  ? ? ?PRN Medications: ?sodium chloride, acetaminophen, ondansetron (ZOFRAN) IV, sodium chloride flush ? ? ? ?Patient Profile  ? ?[name] is a 51 y.o. with history of chronic systolic heart failure dating back to 2020, ETOH abuse, HTN, and tobacco abuse.   ? ?Assessment/Plan  ?1. A flutter RVR  ?-s/p DCCV 10/21. He has been off amio for some time.  ?- on arrival in A flutter with PVCs. He had starting drinking alcohol again.  ?-Continue amio drip ?-Plan for DC-CV later today. NPO  ?-He has not missed any doses of eliquis. Continue  ? ?2. Chronic HFrEF   ?Nonischemic cardiomyopathy.  Admission Q000111Q for acute systolic CHF w/ low output requiring milrinone. Biventricular failure, echo 10/21 EF < 20%, severe RV dysfunction, severe MR. LHC showed no significant coronary disease. CO good on milrinone, was able to wean off. 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  Patient has history of heavy ETOH, has now quit. Cause of cardiomyopathy likely noncompaction versus ETOH.  He has quit drinking. Echo in 1/22 with EF 25%, normal RV, trivial MR.  ?Volume status stable. He does not need loop diuretics. Continue home HF meds. ? ?3. ETOH abuse ?-Drinking a least 1 case of beer a week.  ?-Discussed cessation ? ?4. CKD Stage II ?Creatinine on admit 1.2, stable.  ? ?Length of Stay: 1 ? ?Darrick Grinder, NP  ?09/16/2021, 8:26 AM ? ?Advanced Heart Failure Team ?Pager [phone] (M-F; 7a - 5p)  ?Please contact [facility] for night-coverage after hours (5p -7a ) and weekends on [email or web address] ? ?Patient seen with NP, agree with the above note.  ? ?He remains in atrial flutter, feels fatigued.  Rate controlled on amiodarone gtt.  ? ?General: NAD ?Neck: No JVD, no thyromegaly or thyroid nodule.  ?Lungs: Clear to auscultation bilaterally with normal respiratory effort. ?CV: Nondisplaced PMI.  Heart irregular S1/S2, no S3/S4, no murmur.  No peripheral edema.   ?Abdomen: Soft, nontender, no hepatosplenomegaly, no distention.  ?Skin: Intact without lesions or rashes.  ?Neurologic: Alert and oriented x 3.  ?Psych: Normal affect. ?Extremities: No clubbing or cyanosis.  ?HEENT: Normal.  ? ?1. Chronic Systolic CHF: Nonischemic cardiomyopathy.  Admission Q000111Q for acute systolic CHF w/ low output requiring milrinone. Biventricular failure, echo 10/21 EF < 20%, severe RV dysfunction, severe MR. LHC showed no significant coronary disease. CO good on milrinone, was able to wean off. 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   Echo in 1/22 with EF 25%, normal RV, trivial MR.  No volume overload, NYHA class I symptoms.   Patient has history of heavy ETOH => he quit, but has started back again. Cause of cardiomyopathy likely noncompaction versus ETOH.  He does not look volume overloaded on exam.  ?- Continue Coreg 25 mg bid.   ?- Continue spironolactone 25 daily. BMET today.  ?- Continue Entresto 97/103 mg bid ?- Continue farxiga 10 mg daily  ?- He does not look like he needs diuresis currently.  ?- Will get repeat echo this admission.  ?- Needs to completely stop ETOH again.  ?2. Atrial flutter: s/p DCCV 10/21.  Recurrence, thinks it started about 2-3 wks ago.  Has felt bad over that time.  ?- He has  not missed any Eliquis.  ?- Continue amiodarone gtt, will put him on po amiodarone after DCCV.   ?- DCCV today.  ?- Will need to see EP to consider ablation at this point (recurrence).   ?3. ETOH abuse: Restarted ETOH, needs to quit.  ?4. Mitral regurgitation: Severe on 10/21 echo, echo in 1/22 showed only trivial MR.  ?- Repeat echo this admission.  ? ?[name] ?09/16/2021 ?12:11 PM ? ?

## 2021-09-16 NOTE — Procedures (Signed)
Electrical Cardioversion Procedure Note ?Bryan Guerrero ?[id number] ?05-07-1971 ? ?Procedure: Electrical Cardioversion ?Indications:  Atrial Flutter ? ?Procedure Details ?Consent: Risks of procedure as well as the alternatives and risks of each were explained to the (patient/caregiver).  Consent for procedure obtained. ?Time Out: Verified patient identification, verified procedure, site/side was marked, verified correct patient position, special equipment/implants available, medications/allergies/relevent history reviewed, required imaging and test results available.  Performed ? ?Patient placed on cardiac monitor, pulse oximetry, supplemental oxygen as necessary.  ?Sedation given:  Propofol per anesthesiology ?Pacer pads placed anterior and posterior chest. ? ?Cardioverted 1 time(s).  ?Cardioverted at 150J. ? ?Evaluation ?Findings: Post procedure EKG shows: NSR ?Complications: None ?Patient did tolerate procedure well. ? ? ?Bryan Guerrero ?09/16/2021, 1:34 PM ? ? ? ?

## 2021-09-17 ENCOUNTER — Encounter (HOSPITAL_COMMUNITY): Payer: Self-pay | Admitting: Cardiology

## 2021-09-17 ENCOUNTER — Inpatient Hospital Stay (HOSPITAL_COMMUNITY): Payer: No Typology Code available for payment source

## 2021-09-17 ENCOUNTER — Other Ambulatory Visit (HOSPITAL_COMMUNITY): Payer: Self-pay

## 2021-09-17 DIAGNOSIS — I4892 Unspecified atrial flutter: Secondary | ICD-10-CM

## 2021-09-17 DIAGNOSIS — I5041 Acute combined systolic (congestive) and diastolic (congestive) heart failure: Secondary | ICD-10-CM

## 2021-09-17 LAB — BASIC METABOLIC PANEL
Anion gap: 7 (ref 5–15)
BUN: 20 mg/dL (ref 6–20)
CO2: 28 mmol/L (ref 22–32)
Calcium: 8.7 mg/dL — ABNORMAL LOW (ref 8.9–10.3)
Chloride: 102 mmol/L (ref 98–111)
Creatinine, Ser: 1.34 mg/dL — ABNORMAL HIGH (ref 0.61–1.24)
GFR, Estimated: >60 mL/min (ref >60)
Glucose, Bld: 126 mg/dL — ABNORMAL HIGH (ref 70–99)
Potassium: 3.5 mmol/L (ref 3.5–5.1)
Sodium: 137 mmol/L (ref 135–145)

## 2021-09-17 LAB — ECHOCARDIOGRAM COMPLETE
AR max vel: 2.51 cm2
AV Area VTI: 2.25 cm2
AV Area mean vel: 2.43 cm2
AV Mean grad: 3 mmHg
AV Peak grad: 6.8 mmHg
Ao pk vel: 1.3 m/s
Area-P 1/2: 3.89 cm2
Calc EF: 21.2 %
Height: 72 in
S' Lateral: 5.6 cm
Single Plane A2C EF: 24.2 %
Single Plane A4C EF: 15.8 %
Weight: 2942.4 oz

## 2021-09-17 MED ORDER — POTASSIUM CHLORIDE CRYS ER 20 MEQ PO TBCR
40.0000 meq | EXTENDED_RELEASE_TABLET | Freq: Once | ORAL | Status: AC
  Administered 2021-09-17: 40 meq via ORAL
  Filled 2021-09-17: qty 2

## 2021-09-17 MED ORDER — AMIODARONE HCL 200 MG PO TABS
400.0000 mg | ORAL_TABLET | Freq: Two times a day (BID) | ORAL | Status: DC
  Administered 2021-09-17: 400 mg via ORAL
  Filled 2021-09-17: qty 2

## 2021-09-17 MED ORDER — MAGNESIUM SULFATE 2 GM/50ML IV SOLN
2.0000 g | Freq: Once | INTRAVENOUS | Status: AC
  Administered 2021-09-17: 2 g via INTRAVENOUS
  Filled 2021-09-17: qty 50

## 2021-09-17 MED ORDER — AMIODARONE HCL 200 MG PO TABS
400.0000 mg | ORAL_TABLET | Freq: Two times a day (BID) | ORAL | 0 refills | Status: DC
  Filled 2021-09-17: qty 75, 19d supply, fill #0

## 2021-09-17 MED ORDER — FUROSEMIDE 20 MG PO TABS
20.0000 mg | ORAL_TABLET | Freq: Every day | ORAL | 3 refills | Status: AC | PRN
  Filled 2021-09-17: qty 30, 30d supply, fill #0

## 2021-09-17 NOTE — Progress Notes (Addendum)
? ? Advanced Heart Failure Rounding Note ? ?PCP-Cardiologist: Thurmon Fair, MD  ?[facility]: Dr. Shirlee Latch  ? ?Subjective:   ? ?Admitted with A fib RVR. Started on amio drip. Underwent DCCV yesterday.  ? ?Maintaining NSR currently, HR 60s-70s. On amio gtt at 30/hr  ? ?K 3.5 ? ?Scr 1.19>>1.25>>1.34  ? ?2D Echo completed, interpretation  pending.  ? ?Overall feeing better. No current dyspnea. Parents present at bedside.  ? ?Objective:   ?Weight Range: ?83.4 kg ?Body mass index is 24.94 kg/m?.  ? ?Vital Signs:   ?Temp:  [97.9 ?F (36.6 ?C)-98.6 ?F (37 ?C)] 98.5 ?F (36.9 ?C) (04/07 0730) ?Pulse Rate:  [56-95] 66 (04/07 0730) ?Resp:  [9-19] 12 (04/07 0730) ?BP: (94-124)/(54-86) 105/75 (04/07 0730) ?SpO2:  [96 %-100 %] 98 % (04/07 0730) ?Weight:  [81.6 kg-83.4 kg] 83.4 kg (04/07 0400) ?Last BM Date : 09/15/21 ? ?Weight change: ?Filed Weights  ? 09/15/21 1702 09/16/21 1258 09/17/21 0400  ?Weight: 88.9 kg 81.6 kg 83.4 kg  ? ? ?Intake/Output:  ? ?Intake/Output Summary (Last 24 hours) at 09/17/2021 1138 ?Last data filed at 09/17/2021 1937 ?Gross per 24 hour  ?Intake 356 ml  ?Output 875 ml  ?Net -519 ml  ?  ? ? ?Physical Exam  ? ?General:  Well appearing. No respiratory difficulty ?HEENT: normal ?Neck: supple. JVD ~6 cm. Carotids 2+ bilat; no bruits. No lymphadenopathy or thyromegaly appreciated. ?Cor: PMI nondisplaced. Regular rate & rhythm. No rubs, gallops or murmurs. ?Lungs: clear ?Abdomen: soft, nontender, nondistended. No hepatosplenomegaly. No bruits or masses. Good bowel sounds. ?Extremities: no cyanosis, clubbing, rash, edema ?Neuro: alert & oriented x 3, cranial nerves grossly intact. moves all 4 extremities w/o difficulty. Affect pleasant. ? ? ?Telemetry  ? ?NSR 60s-70s  ? ?EKG  ?  ?No new EKG to review ? ?Labs  ?  ?CBC ?Recent Labs  ?  09/15/21 ?1706  ?WBC 5.6  ?NEUTROABS 3.6  ?HGB 13.9  ?HCT 43.1  ?MCV 97.5  ?PLT 231  ? ?Basic Metabolic Panel ?Recent Labs  ?  09/15/21 ?1706 09/16/21 ?0145 09/17/21 ?0309  ?NA 141 138 137  ?K  3.9 3.7 3.5  ?CL 111 107 102  ?CO2 16* 24 28  ?GLUCOSE 80 81 126*  ?BUN 12 13 20   ?CREATININE 1.19 1.25* 1.34*  ?CALCIUM 8.9 8.9 8.7*  ?MG 1.8  --   --   ? ?Liver Function Tests ?Recent Labs  ?  09/15/21 ?1706  ?AST 14*  ?ALT 12  ?ALKPHOS 54  ?BILITOT 1.3*  ?PROT 6.2*  ?ALBUMIN 3.6  ? ?Recent Labs  ?  09/15/21 ?1706  ?LIPASE 29  ? ?Cardiac Enzymes ?No results for input(s): CKTOTAL, CKMB, CKMBINDEX, TROPONINI in the last 72 hours. ? ?BNP: ?BNP (last 3 results) ?Recent Labs  ?  09/15/21 ?2106  ?BNP 66.2  ? ? ?ProBNP (last 3 results) ?No results for input(s): PROBNP in the last 8760 hours. ? ? ?D-Dimer ?No results for input(s): DDIMER in the last 72 hours. ?Hemoglobin A1C ?No results for input(s): HGBA1C in the last 72 hours. ?Fasting Lipid Panel ?No results for input(s): CHOL, HDL, LDLCALC, TRIG, CHOLHDL, LDLDIRECT in the last 72 hours. ?Thyroid Function Tests ?Recent Labs  ?  09/15/21 ?1706  ?TSH 1.303  ? ? ?Other results: ? ? ?Imaging  ? ? ?No results found. ? ? ?Medications:   ? ? ?Scheduled Medications: ? apixaban  5 mg Oral BID  ? carvedilol  25 mg Oral BID  ? dapagliflozin propanediol  10 mg Oral  Daily  ? sacubitril-valsartan  1 tablet Oral BID  ? sodium chloride flush  3 mL Intravenous Q12H  ? spironolactone  25 mg Oral Daily  ? ? ?Infusions: ? sodium chloride    ? amiodarone 30 mg/hr (09/17/21 0927)  ? ? ?PRN Medications: ?sodium chloride, acetaminophen, ondansetron (ZOFRAN) IV, sodium chloride flush ? ? ? ?Patient Profile  ? ?Mr Delashmit is a 51 y.o. with history of chronic systolic heart failure dating back to 2020, ETOH abuse, HTN, and tobacco abuse.   ? ?Assessment/Plan  ? ?1. Chronic Systolic CHF: Nonischemic cardiomyopathy.  Admission 10/21 for acute systolic CHF w/ low output requiring milrinone. Biventricular failure, echo 10/21 EF < 20%, severe RV dysfunction, severe MR. LHC showed no significant coronary disease. CO good on milrinone, was able to wean off. 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  Echo in 1/22 with EF 25%, normal RV, trivial MR.  No volume overload, NYHA class I symptoms.   Patient has history of heavy ETOH => he quit, but has started back again. Cause of cardiomyopathy likely noncompaction versus ETOH.  He does not look volume overloaded on exam.  ?- Continue Coreg 25 mg bid.   ?- Continue spironolactone 25 daily.   ?- Continue Entresto 97/103 mg bid ?- Continue farxiga 10 mg daily  ?- He does not look like he needs diuresis currently.  ?- repeat echo pending  ?- Needs to completely stop ETOH again.  ?2. Atrial flutter: s/p DCCV 10/21.  Recurrence, thinks it started about 2-3 wks ago.  Has felt bad over that time.  ?- He has not missed any Eliquis. Placed on amio gtt and underwent DCCV 4/6 back to NSR. Maintaining NSR ?- Switch to PO amiodarone 400 mg bid ?- D/w EP ablation evaluation. They will see him next week as outpatient (scheduled w/ Dr. Lalla Brothers)  ?- Continue Eliquis 5 mg bid  ?3. ETOH abuse: Restarted ETOH, needs to quit.  ?4. Mitral regurgitation: Severe on 10/21 echo, echo in 1/22 showed only trivial MR.  ?- Repeat echo pending  ? ?Anticipate d/c home today. MD to assess.  ? ?Will arrange close [facility] f/u. He will be seen by EP next week in clinic to discuss ablation.  ? ? ?Length of Stay: 2 ? ?Robbie Lis, PA-C  ?09/17/2021, 11:38 AM ? ?Advanced Heart Failure Team ?Pager [phone] (M-F; 7a - 5p)  ?Please contact [facility] for night-coverage after hours (5p -7a ) and weekends on [email or web address] ? ?Patient seen and examined with the above-signed Advanced Practice Provider and/or Housestaff. I personally reviewed laboratory data, imaging studies and relevant notes. I independently examined the patient and formulated the important aspects of the plan. I have edited the note to reflect any of my changes or salient points. I have personally discussed the plan with the patient and/or family. ? ?Remains in NSR post DCCV on IV amio. Feels  good. No CP or SOB. ? ?Echo today EF 20-25% prominent apical trabeculations. 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 Mild MR. RV mildly HK  ? ?General:  Well appearing. No resp difficulty ?HEENT: normal ?Neck: supple. no JVD. Carotids 2+ bilat; no bruits. No lymphadenopathy or thryomegaly appreciated. ?Cor: PMI nondisplaced. Regular rate & rhythm. No rubs, gallops or murmurs. ?Lungs: clear ?Abdomen: soft, nontender, nondistended. No hepatosplenomegaly. No bruits or masses. Good bowel sounds. ?Extremities: no cyanosis, clubbing, rash, edema ?Neuro: alert & orientedx3, cranial nerves grossly intact. moves all 4 extremities w/o difficulty. Affect pleasant ? ?He looks good. Maintaining NSR. Volume status  ok. I reviewed echo personally and is suspicious for LVNC.  ? ?Switch amio to po Continue Eliquis. Continue GDMT. Reinforced need to avoid ETOH.  ? ?Ok for d/c today.  ? ?Arvilla Meres, MD  ?3:06 PM ? ? ?

## 2021-09-17 NOTE — Progress Notes (Signed)
Echocardiogram ?2D Echocardiogram has been performed. ? ?Devonne Doughty ?09/17/2021, 11:18 AM ?

## 2021-09-17 NOTE — TOC Initial Note (Addendum)
Transition of Care (TOC) - Initial/Assessment Note  ? ? ?Patient Details  ?Name: Bryan Guerrero ?MRN: [id number] ?Date of Birth: January 01, 1971 ? ?Transition of Care [facility]) CM/SW Contact:    ?Marcheta Grammes Rexene Alberts, RN ?Phone Number:  [phone] ?09/17/2021, 2:17 PM ? ?Clinical Narrative:                 ? ?HF TOC CM spoke to pt and states he works full-time. He does have insurance. Will send CM a picture of his card. Pt states he will need a note for work. PCP appt arranged at [place] for 10/14/2022 at 10:10 am. Message sent to attending for work note.  ? ?Work note provided to patient.  ? ? ? ? ?Expected Discharge Plan: Home/Self Care ?Barriers to Discharge: No Barriers Identified ? ? ?Patient Goals and CMS Choice ?  ?  ?Choice offered to / list presented to : Patient ? ?Expected Discharge Plan and Services ?Expected Discharge Plan: Home/Self Care ?  ?Discharge Planning Services: CM Consult ?  ?Living arrangements for the past 2 months: Apartment ?Expected Discharge Date: 09/17/21               ? ? ?Prior Living Arrangements/Services ?Living arrangements for the past 2 months: Apartment ?Lives with:: Self ?Patient language and need for interpreter reviewed:: Yes ?Do you feel safe going back to the place where you live?: Yes      ?Need for Family Participation in Patient Care: No (Comment) ?Care giver support system in place?: No (comment) ?  ?Criminal Activity/Legal Involvement Pertinent to Current Situation/Hospitalization: No - Comment as needed ? ?Activities of Daily Living ?Home Assistive Devices/Equipment: None ?ADL Screening (condition at time of admission) ?Patient's cognitive ability adequate to safely complete daily activities?: Yes ?Is the patient deaf or have difficulty hearing?: No ?Does the patient have difficulty seeing, even when wearing glasses/contacts?: No ?Does the patient have difficulty concentrating, remembering, or making decisions?: No ?Patient able to express need for  assistance with ADLs?: Yes ?Does the patient have difficulty dressing or bathing?: No ?Independently performs ADLs?: Yes (appropriate for developmental age) ?Does the patient have difficulty walking or climbing stairs?: No ?Weakness of Legs: None ?Weakness of Arms/Hands: None ? ?Permission Sought/Granted ?Permission sought to share information with : Case Manager, Family Supports, PCP ?Permission granted to share information with : Yes, Verbal Permission Granted ?   ?   ?   ?   ? ?Emotional Assessment ?  ?Attitude/Demeanor/Rapport: Engaged ?Affect (typically observed): Accepting ?Orientation: : Oriented to Self, Oriented to Place, Oriented to  Time, Oriented to Situation ?  ?Psych Involvement: No (comment) ? ?Admission diagnosis:  Acute combined systolic and diastolic heart failure ([place]) [I50.41] ?NSVT (nonsustained ventricular tachycardia) ([place]) [I47.29] ?Atrial flutter, unspecified type ([place]) [I48.92] ?Patient Active Problem List  ? Diagnosis Date Noted  ? Acute combined systolic and diastolic heart failure ([place]) 09/15/2021  ? Acute on chronic combined systolic and diastolic CHF (congestive heart failure) ([place]) 03/19/2020  ? CHF (congestive heart failure) ([place]) 03/16/2020  ? Alcohol abuse   ? Alcoholic cardiomyopathy ([place])   ? Tachycardia   ? Atrial flutter ([place])   ? Primary hypertension   ? Chronic systolic congestive heart failure ([place]) 08/14/2018  ? SOB (shortness of breath) 08/04/2018  ? ?PCP:  Elsie Stain, MD ?Pharmacy:   ?[facility] ([place]), [place] - [address] ?[address] ?[place] [address] ?Phone: [phone] Fax: [phone] ? ?[facility]  [address], [place] [address] ?[place] [address] ?Phone: [phone] Fax: [phone] ? ?[facility] [place] [employer], [address] ?[place] [address] ?Phone: [phone] Fax: [phone] ? ?[facility] [place],  [address] ?[place] [address] ?Phone: [phone] Fax: [phone] ? ?[facility] ?[address] ?[place] [address] ?Phone: [phone] Fax: [phone] ? ? ? ? ?Social Determinants of Health (SDOH) Interventions ?  ? ?Readmission Risk Interventions ?   ? View : No data to display.  ?  ?  ?  ? ? ? ?

## 2021-09-17 NOTE — Discharge Summary (Addendum)
Advanced Heart Failure Team ? ?Discharge Summary  ? ?Patient ID: Bryan Guerrero ?MRN: [id number], DOB/AGE: 12/21/1970 51 y.o. Admit date: 09/15/2021 ?D/C date:     09/17/2021  ? ?Primary Discharge Diagnoses:  ?Atrial Flutter w/ RVR, s/p DCCV ?A/c Biventricular Heart Failure/ NICM  ?ETOH Abuse  ?Hypokalemia ? ? ?Hospital Course: ? ?Mr Soldano is a 51 y.o. with history of chronic systolic heart failure dating back to 2020, ETOH abuse, HTN, and tobacco abuse.   ?  ?Admitted back in February 2020 with increased shortness of breath. At that time he was drinking 1/5 of licquor per day. ECHO completed and showed severely reduced EF 15-20%. 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 No formal cath. He was discharged 08/08/19. He returned for one appointment with cardiology but no return f/u after that.  ?  ?Admitted 10/21 for acute on chronic systolic heart failure + atrial flutter w/ RVR requiring DCCV. Also w/ low output requiring milrinone. R/LHC showed mildly elevated PCWP, normal RA pressure, normal cardiac output on milrinone and no significant coronary disease. Echo showed severe LVEF <20%, severe RV dysfunction and severe MR. He was diuresed w/ IV Lasix. Was able to wean off milrinone. Discharge wt was 186 lb.  ?  ?Echo in 1/22 showed EF 25% with global hypokinesis, normal RV, trivial MR. ? ?Presented to the ED on 4/6 w/ two days of worsening shortness of breath with exertion, lethargy, and dizziness. Found to be in rapid atrial flutter and a/c CHF w/ mild volume overload. Serum etOH was positive. He admitted to resuming etOH use but less than before; a couple beers a day. He reported being fully compliance w/ Eliquis w/o any missed doses in the last 30 days.  ? ?He was admitted and placed on amio gtt. Continued on Eliquis. IV Lasix given for CHF w/ good diuresis. ? ?Echo showed EF 20-25%, RV low normal. Only mild MR  ? ?He underwent successful DCCV back to NSR. He was monitored an additional night and remained in NSR. IV  amio converted to PO amio. Case was d/w EP and he will be seen in outpatient EP clinic in 1 week to discuss AFL ablation. Cessation from ETOH recommended.   ? ?On 4/8, he was last seen and examined by Dr. Haroldine Laws and felt stable for d/c home. Consultation arranged w/ Dr. Quentin Ore on 4/12. Has f/u in the [facility] on 4/21.  ? ?Also prior home meds/ GDMT resumed. PRN lasix ordered at d/c for wt gain.  ? ? ?Discharge Weight Range: 183 lb  ?Discharge Vitals: Blood pressure 107/79, pulse 67, temperature 98.6 ?F (37 ?C), temperature source Oral, resp. rate 13, height 6' (1.829 m), weight 83.4 kg, SpO2 99 %. ? ?Labs: ?Lab Results  ?Component Value Date  ? WBC 5.6 09/15/2021  ? HGB 13.9 09/15/2021  ? HCT 43.1 09/15/2021  ? MCV 97.5 09/15/2021  ? PLT 231 09/15/2021  ?  ?Recent Labs  ?Lab 09/15/21 ?1706 09/16/21 ?0145 09/17/21 ?0309  ?NA 141   < > 137  ?K 3.9   < > 3.5  ?CL 111   < > 102  ?CO2 16*   < > 28  ?BUN 12   < > 20  ?CREATININE 1.19   < > 1.34*  ?CALCIUM 8.9   < > 8.7*  ?PROT 6.2*  --   --   ?BILITOT 1.3*  --   --   ?ALKPHOS 54  --   --   ?ALT 12  --   --   ?  AST 14*  --   --   ?GLUCOSE 80   < > 126*  ? < > = values in this interval not displayed.  ? ?No results found for: CHOL, HDL, LDLCALC, TRIG ?BNP (last 3 results) ?Recent Labs  ?  09/15/21 ?2106  ?BNP 66.2  ? ? ?ProBNP (last 3 results) ?No results for input(s): PROBNP in the last 8760 hours. ? ? ?Diagnostic Studies/Procedures  ? ?DG Chest Portable 1 View ? ?Result Date: 09/15/2021 ?CLINICAL DATA:  Chest pain EXAM: PORTABLE CHEST 1 VIEW COMPARISON:  Chest radiograph dated March 16, 2020 FINDINGS: The heart size and mediastinal contours are within normal limits. Both lungs are clear. The visualized skeletal structures are unremarkable. IMPRESSION: No active disease. Electronically Signed   By: Keane Police D.O.   On: 09/15/2021 17:44  ? ?ECHOCARDIOGRAM COMPLETE ? ?Result Date: 09/17/2021 ?   ECHOCARDIOGRAM REPORT   Patient Name:   Bryan Guerrero Date of Exam: 09/17/2021  Medical Rec #:  [id number]       Height:       72.0 in Accession #:    [id number]      Weight:       183.9 lb Date of Birth:  1971/04/21      BSA:          2.056 m? Patient Age:    51 years        BP:           105/75 mmHg Patient Gender: M               HR:           66 bpm. Exam Location:  Inpatient Procedure: 2D Echo Indications:    Atrial flutter  History:        Patient has prior history of Echocardiogram examinations, most                 recent 07/10/2020. Cardiomyopathy, Signs/Symptoms:Shortness of                 Breath; Risk Factors:Hypertension.  Sonographer:    Arlyss Gandy Referring Phys: [id number] CARRIEL T NIPP IMPRESSIONS  1. Left ventricular ejection fraction, by estimation, is 20 to 25%. The left ventricle has severely decreased function. The left ventricle demonstrates global hypokinesis. The left ventricular internal cavity size was mildly dilated. Left ventricular diastolic parameters are indeterminate.  2. Right ventricular systolic function is low normal. The right ventricular size is normal. There is normal pulmonary artery systolic pressure.  3. Left atrial size was moderately dilated.  4. The mitral valve is normal in structure. Trivial mitral valve regurgitation. No evidence of mitral stenosis.  5. The aortic valve is tricuspid. Aortic valve regurgitation is not visualized. No aortic stenosis is present.  6. The inferior vena cava is dilated in size with >50% respiratory variability, suggesting right atrial pressure of 8 mmHg. Comparison(s): No significant change from prior study. FINDINGS  Left Ventricle: Left ventricular ejection fraction, by estimation, is 20 to 25%. The left ventricle has severely decreased function. The left ventricle demonstrates global hypokinesis. The left ventricular internal cavity size was mildly dilated. There is no left ventricular hypertrophy. Left ventricular diastolic parameters are indeterminate. Right Ventricle: The right ventricular size is normal. No  increase in right ventricular wall thickness. Right ventricular systolic function is low normal. There is normal pulmonary artery systolic pressure. The tricuspid regurgitant velocity is 2.24 m/s,  and with an assumed right atrial pressure of 8 mmHg,  the estimated right ventricular systolic pressure is [id number]. Left Atrium: Left atrial size was moderately dilated. Right Atrium: Right atrial size was normal in size. Pericardium: There is no evidence of pericardial effusion. Mitral Valve: The mitral valve is normal in structure. Trivial mitral valve regurgitation. No evidence of mitral valve stenosis. Tricuspid Valve: The tricuspid valve is normal in structure. Tricuspid valve regurgitation is trivial. No evidence of tricuspid stenosis. Aortic Valve: The aortic valve is tricuspid. Aortic valve regurgitation is not visualized. No aortic stenosis is present. Aortic valve mean gradient measures 3.0 mmHg. Aortic valve peak gradient measures 6.8 mmHg. Aortic valve area, by VTI measures 2.25 cm?. Pulmonic Valve: The pulmonic valve was not well visualized. Pulmonic valve regurgitation is not visualized. No evidence of pulmonic stenosis. Aorta: The aortic root, ascending aorta, aortic arch and descending aorta are all structurally normal, with no evidence of dilitation or obstruction. Venous: The inferior vena cava is dilated in size with greater than 50% respiratory variability, suggesting right atrial pressure of 8 mmHg. IAS/Shunts: The atrial septum is grossly normal.  LEFT VENTRICLE PLAX 2D LVIDd:         6.10 cm      Diastology LVIDs:         5.60 cm      LV e' medial:    7.94 cm/s LV PW:         1.00 cm      LV E/e' medial:  9.3 LV IVS:        1.00 cm      LV e' lateral:   7.83 cm/s LVOT diam:     2.30 cm      LV E/e' lateral: 9.4 LV SV:         55 LV SV Index:   27 LVOT Area:     4.15 cm?  LV Volumes (MOD) LV vol d, MOD A2C: 149.0 ml LV vol d, MOD A4C: 133.0 ml LV vol s, MOD A2C: 113.0 ml LV vol s, MOD A4C: 112.0 ml  LV SV MOD A2C:     36.0 ml LV SV MOD A4C:     133.0 ml LV SV MOD BP:      30.3 ml RIGHT VENTRICLE            IVC RV Basal diam:  3.50 cm    IVC diam: 2.10 cm RV Mid diam:    3.30 cm RV S prime:     9.90 cm/s

## 2021-09-17 NOTE — Anesthesia Postprocedure Evaluation (Signed)
Anesthesia Post Note ? ?Patient: Bryan Guerrero ? ?Procedure(s) Performed: CARDIOVERSION ? ?  ? ?Patient location during evaluation: PACU ?Anesthesia Type: General ?Level of consciousness: awake and alert ?Pain management: pain level controlled ?Vital Signs Assessment: post-procedure vital signs reviewed and stable ?Respiratory status: spontaneous breathing, nonlabored ventilation and respiratory function stable ?Cardiovascular status: blood pressure returned to baseline and stable ?Postop Assessment: no apparent nausea or vomiting ?Anesthetic complications: no ? ? ?No notable events documented. ? ?Last Vitals:  ?Vitals:  ? 09/17/21 0400 09/17/21 0730  ?BP: (!) 98/54 105/75  ?Pulse: 61 66  ?Resp: 14 12  ?Temp: 36.6 ?C 36.9 ?C  ?SpO2:  98%  ?  ?Last Pain:  ?Vitals:  ? 09/17/21 0730  ?TempSrc: Oral  ?PainSc:   ? ?Pain Goal:   ? ?  ?  ?  ?  ?  ?  ?  ? ?Lowella Curb ? ? ? ? ?

## 2021-09-20 ENCOUNTER — Telehealth: Payer: Self-pay

## 2021-09-20 NOTE — Telephone Encounter (Signed)
Transition Care Management Unsuccessful Follow-up Telephone Call ? ?Date of discharge and from where:  09/17/2021, [facility]  ? ?Attempts:  1st Attempt ? ?Reason for unsuccessful TCM follow-up call:  Left voice message on #  ?[phone]. ?Call back requested.  ? ?Patient has appointment with Corene Cornea, PA @ [facility]-10/13/2021.  ? ? ?

## 2021-09-20 NOTE — Telephone Encounter (Signed)
Transition Care Management Follow-up Telephone Call ?Date of discharge and from where: 09/17/2021, [facility] ?How have you been since you were released from the hospital? He stated that he is doing fine.  ?Any questions or concerns? No ? ?Items Reviewed: ?Did the pt receive and understand the discharge instructions provided? Yes  ?Medications obtained and verified? Yes  - he said he has all of his medications and did not have any questions about the med regime  ?Other? No  ?Any new allergies since your discharge? No  ?Dietary orders reviewed? Yes - he said he is trying to adhere to a heart healthy diet  ?Do you have support at home?  Not discussed ? ?Home Care and Equipment/Supplies: ?Were home health services ordered? no ?If so, what is the name of the agency? N/a  ?Has the agency set up a time to come to the patient's home? not applicable ?Were any new equipment or medical supplies ordered?  No ?What is the name of the medical supply agency? N/a ?Were you able to get the supplies/equipment? not applicable ?Do you have any questions related to the use of the equipment or supplies? No ? ?Functional Questionnaire: (I = Independent and D = Dependent) ?ADLs: independent ? ?Follow up appointments reviewed: ? ?PCP Hospital f/u appt confirmed? Yes  Scheduled to see Ricky Stabs, PA - 10/13/2021. He will need to establish care with PCP  Dr Delford Field is listed as his PCP but has never seen him  ?Specialist Hospital f/u appt confirmed? Yes  Scheduled to see cardiology- 09/22/2021.  ?Are transportation arrangements needed? No  ?If their condition worsens, is the pt aware to call PCP or go to the Emergency Dept.? Yes ?Was the patient provided with contact information for the PCP's office or ED? Yes ?Was to pt encouraged to call back with questions or concerns? Yes ? ?

## 2021-09-20 NOTE — Telephone Encounter (Signed)
Pt returned your call during lunch time. Advised pt you will call back. ?

## 2021-09-21 NOTE — Progress Notes (Deleted)
?Electrophysiology Office Follow up Visit Note:   ? ?Date:  09/21/2021  ? ?ID:  Bryan Guerrero, DOB 06-08-1971, MRN [id number] ? ?PCP:  Elsie Stain, MD  ?[facility] Cardiologist:  Sanda Klein, MD  ?[facility] Electrophysiologist:  Vickie Epley, MD  ? ? ?Interval History:   ? ?Bryan Guerrero is a 51 y.o. male who presents for a follow up visit.  I last saw the patient April 14, 2020 for atrial flutter.  He been previously cardioverted for atrial flutter and maintained on amiodarone for suppression.  He is on Eliquis for stroke prophylaxis.  He has a history of chronic systolic heart failure and is followed by Dr. Aundra Dubin in the heart failure clinic.  He was admitted again early April 2023 for atrial flutter and decompensated heart failure.  He was cardioverted back to sinus rhythm and presents today for follow-up to discuss atrial flutter ablation. ? ? ? ?  ? ?Past Medical History:  ?Diagnosis Date  ? Alcohol abuse   ? Asthma   ? Cardiomyopathy ([place])   ? Dyspnea   ? Hypertension   ? ? ?Past Surgical History:  ?Procedure Laterality Date  ? CARDIOVERSION N/A 03/17/2020  ? Procedure: CARDIOVERSION;  Surgeon: Lelon Perla, MD;  Location: [place];  Service: Cardiovascular;  Laterality: N/A;  ? CARDIOVERSION N/A 09/16/2021  ? Procedure: CARDIOVERSION;  Surgeon: Larey Dresser, MD;  Location: [facility] ENDOSCOPY;  Service: Cardiovascular;  Laterality: N/A;  ? RIGHT/LEFT HEART CATH AND CORONARY ANGIOGRAPHY N/A 03/19/2020  ? Procedure: RIGHT/LEFT HEART CATH AND CORONARY ANGIOGRAPHY;  Surgeon: Larey Dresser, MD;  Location: [place] CV LAB;  Service: Cardiovascular;  Laterality: N/A;  ? TEE WITHOUT CARDIOVERSION  10/052021  ? TEE WITHOUT CARDIOVERSION N/A 03/17/2020  ? Procedure: TRANSESOPHAGEAL ECHOCARDIOGRAM (TEE);  Surgeon: Lelon Perla, MD;  Location: [facility] ENDOSCOPY;  Service: Cardiovascular;  Laterality: N/A;  ? ? ?Current Medications: ?No outpatient medications have been marked as taking  for the 09/22/21 encounter (Appointment) with Vickie Epley, MD.  ?  ? ?Allergies:   Patient has no known allergies.  ? ?Social History  ? ?Socioeconomic History  ? Marital status: Single  ?  Spouse name: Not on file  ? Number of children: Not on file  ? Years of education: Not on file  ? Highest education level: Not on file  ?Occupational History  ? Not on file  ?Tobacco Use  ? Smoking status: Former  ?  Types: Cigars  ?  Quit date: 07/14/2018  ?  Years since quitting: 3.1  ? Smokeless tobacco: Never  ?Vaping Use  ? Vaping Use: Never used  ?Substance and Sexual Activity  ? Alcohol use: Not Currently  ?  Comment: quit 03/09/20  ? Drug use: Not Currently  ?  Types: Marijuana  ?  Comment: quit 03/09/20  ? Sexual activity: Not Currently  ?Other Topics Concern  ? Not on file  ?Social History Narrative  ? Not on file  ? ?Social Determinants of Health  ? ?Financial Resource Strain: Not on file  ?Food Insecurity: Not on file  ?Transportation Needs: Not on file  ?Physical Activity: Not on file  ?Stress: Not on file  ?Social Connections: Not on file  ?  ? ?Family History: ?The patient's family history includes Hypertension in his father. There is no history of Cancer or Diabetes. ? ?ROS:   ?Please see the history of present illness.    ?All other systems reviewed and are negative. ? ?  EKGs/Labs/Other Studies Reviewed:   ? ?The following studies were reviewed today: ? ?September 15, 2021 EKG shows typical appearing atrial flutter with rapid ventricular rate and frequent PVCs ? ?September 17, 2021 echo ?Left ventricular function severely decreased, 20% ?Right ventricular function low normal ?Moderately dilated left atrium ?Trivial MR ? ?EKG:  The ekg ordered today demonstrates *** ? ?Recent Labs: ?09/15/2021: ALT 12; B Natriuretic Peptide 66.2; Hemoglobin 13.9; Magnesium 1.8; Platelets 231; TSH 1.303 ?09/17/2021: BUN 20; Creatinine, Ser 1.34; Potassium 3.5; Sodium 137  ?Recent Lipid Panel ?No results found for: CHOL, TRIG, HDL, CHOLHDL,  VLDL, LDLCALC, LDLDIRECT ? ?Physical Exam:   ? ?VS:  There were no vitals taken for this visit.   ? ?Wt Readings from Last 3 Encounters:  ?09/17/21 183 lb 14.4 oz (83.4 kg)  ?04/21/21 196 lb (88.9 kg)  ?03/31/21 200 lb 9.6 oz (91 kg)  ?  ? ?GEN: *** Well nourished, well developed in no acute distress ?HEENT: Normal ?NECK: No JVD; No carotid bruits ?LYMPHATICS: No lymphadenopathy ?CARDIAC: ***RRR, no murmurs, rubs, gallops ?RESPIRATORY:  Clear to auscultation without rales, wheezing or rhonchi  ?ABDOMEN: Soft, non-tender, non-distended ?MUSCULOSKELETAL:  No edema; No deformity  ?SKIN: Warm and dry ?NEUROLOGIC:  Alert and oriented x 3 ?PSYCHIATRIC:  Normal affect  ? ? ? ?  ? ?ASSESSMENT:   ? ?No diagnosis found. ?PLAN:   ? ?In order of problems listed above: ? ?Atrial flutter ablation ? ?Continue Eliquis ?Stop Amio 3 months after ablation ? ? ? ? ? ? ?Total time spent with patient today *** minutes. This includes reviewing records, evaluating the patient and coordinating care.  ? ?Medication Adjustments/Labs and Tests Ordered: ?Current medicines are reviewed at length with the patient today.  Concerns regarding medicines are outlined above.  ?No orders of the defined types were placed in this encounter. ? ?No orders of the defined types were placed in this encounter. ? ? ? ?Signed, ?Lars Mage, MD, [facility], [facility] ?09/21/2021 11:18 PM    ?Electrophysiology ?[place] ?

## 2021-09-22 ENCOUNTER — Encounter: Payer: Self-pay | Admitting: Cardiology

## 2021-09-22 ENCOUNTER — Ambulatory Visit (INDEPENDENT_AMBULATORY_CARE_PROVIDER_SITE_OTHER): Payer: No Typology Code available for payment source | Admitting: Cardiology

## 2021-09-22 VITALS — BP 100/70 | HR 58 | Ht 72.0 in | Wt 189.0 lb

## 2021-09-22 DIAGNOSIS — Z79899 Other long term (current) drug therapy: Secondary | ICD-10-CM | POA: Diagnosis not present

## 2021-09-22 DIAGNOSIS — I5022 Chronic systolic (congestive) heart failure: Secondary | ICD-10-CM

## 2021-09-22 DIAGNOSIS — F1010 Alcohol abuse, uncomplicated: Secondary | ICD-10-CM

## 2021-09-22 DIAGNOSIS — I483 Typical atrial flutter: Secondary | ICD-10-CM | POA: Diagnosis not present

## 2021-09-22 NOTE — Progress Notes (Signed)
?Electrophysiology Office Follow up Visit Note:   ? ?Date:  09/22/2021  ? ?ID:  Bryan Guerrero, DOB [birth year], MRN [id number] ? ?PCP:  Storm Frisk, MD  ?[facility]Care Cardiologist:  Thurmon Fair, MD  ?[facility] HeartCare Electrophysiologist:  Lanier Prude, MD  ? ? ?Interval History:   ? ?Bryan Guerrero is a 51 y.o. male who presents for a follow up visit.  I last saw the patient April 14, 2020 for atrial flutter.  He been previously cardioverted for atrial flutter and maintained on amiodarone for suppression.  He is on Eliquis for stroke prophylaxis.  He has a history of chronic systolic heart failure and is followed by Dr. Shirlee Latch in the heart failure clinic.  He was admitted again early April 2023 for atrial flutter and decompensated heart failure.  He was cardioverted back to sinus rhythm and presents today for follow-up to discuss atrial flutter ablation. ? ?He is accompanied by his father. Today, he appears well. We reviewed at length the procedural details of atrial flutter ablation. ? 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 In the past few weeks he has noticed some shortness of breath, but this was not an issue prior to then. ? ?At work he passed out once previously. ? ?He denies any palpitations, chest pain, or peripheral edema. No lightheadedness, headaches, orthopnea, or PND. ? ? ?  ? ?Past Medical History:  ?Diagnosis Date  ? Alcohol abuse   ? Asthma   ? Cardiomyopathy (HCC)   ? Dyspnea   ? Hypertension   ? ? ?Past Surgical History:  ?Procedure Laterality Date  ? CARDIOVERSION N/A 03/17/2020  ? Procedure: CARDIOVERSION;  Surgeon: Lewayne Bunting, MD;  Location: [facility] ENDOSCOPY;  Service: Cardiovascular;  Laterality: N/A;  ? CARDIOVERSION N/A 09/16/2021  ? Procedure: CARDIOVERSION;  Surgeon: Laurey Morale, MD;  Location: [facility] ENDOSCOPY;  Service: Cardiovascular;  Laterality: N/A;  ? RIGHT/LEFT HEART CATH AND CORONARY ANGIOGRAPHY N/A 03/19/2020  ? Procedure: RIGHT/LEFT HEART  CATH AND CORONARY ANGIOGRAPHY;  Surgeon: Laurey Morale, MD;  Location: [facility] INVASIVE CV LAB;  Service: Cardiovascular;  Laterality: N/A;  ? TEE WITHOUT CARDIOVERSION  10/052021  ? TEE WITHOUT CARDIOVERSION N/A 03/17/2020  ? Procedure: TRANSESOPHAGEAL ECHOCARDIOGRAM (TEE);  Surgeon: Lewayne Bunting, MD;  Location: [facility] ENDOSCOPY;  Service: Cardiovascular;  Laterality: N/A;  ? ? ?Current Medications: ?Current Meds  ?Medication Sig  ? acetaminophen (TYLENOL) 500 MG tablet Take 1,000 mg by mouth every 6 (six) hours as needed for mild pain.  ? amiodarone (PACERONE) 200 MG tablet Take 2 tablets (400 mg total) by mouth 2 (two) times daily.  ? apixaban (ELIQUIS) 5 MG TABS tablet Take 1 tablet (5 mg total) by mouth 2 (two) times daily.  ? carvedilol (COREG) 25 MG tablet Take 1 tablet (25 mg total) by mouth 2 (two) times daily.  ? dapagliflozin propanediol (FARXIGA) 10 MG TABS tablet Take 1 tablet (10 mg total) by mouth daily.  ? furosemide (LASIX) 20 MG tablet Take 1 tablet (20 mg total) by mouth daily as needed. For > 3 lb weight gain in 24 hr or > 5 lb weight gain in 1 week  ? Multiple Vitamin (MULTIVITAMIN WITH MINERALS) TABS tablet Take 1 tablet by mouth daily.  ? sacubitril-valsartan (ENTRESTO) 97-103 MG Take 1 tablet by mouth 2 (two) times daily.  ? spironolactone (ALDACTONE) 25 MG tablet Take 1 tablet by mouth once daily  ?  ? ?Allergies:  Patient has no known allergies.  ? ?Social History  ? ?Socioeconomic History  ? Marital status: Single  ?  Spouse name: Not on file  ? Number of children: Not on file  ? Years of education: Not on file  ? Highest education level: Not on file  ?Occupational History  ? Not on file  ?Tobacco Use  ? Smoking status: Former  ?  Types: Cigars  ?  Quit date: 07/14/2018  ?  Years since quitting: 3.1  ? Smokeless tobacco: Never  ?Vaping Use  ? Vaping Use: Never used  ?Substance and Sexual Activity  ? Alcohol use: Not Currently  ?  Comment: quit 03/09/20  ? Drug use: Not Currently  ?  Types:  Marijuana  ?  Comment: quit 03/09/20  ? Sexual activity: Not Currently  ?Other Topics Concern  ? Not on file  ?Social History Narrative  ? Not on file  ? ?Social Determinants of Health  ? ?Financial Resource Strain: Not on file  ?Food Insecurity: Not on file  ?Transportation Needs: Not on file  ?Physical Activity: Not on file  ?Stress: Not on file  ?Social Connections: Not on file  ?  ? ?Family History: ?The patient's family history includes Hypertension in his father. There is no history of Cancer or Diabetes. ? ?ROS:   ?Please see the history of present illness.    ?All other systems reviewed and are negative. ? ?EKGs/Labs/Other Studies Reviewed:   ? ?The following studies were reviewed today: ? ?September 15, 2021 EKG shows typical appearing atrial flutter with rapid ventricular rate and frequent PVCs ? ?September 17, 2021 echo ?Left ventricular function severely decreased, 20% ?Right ventricular function low normal ?Moderately dilated left atrium ?Trivial MR ? ?March 20, 2020, Cardiac MRI ?IMPRESSION: ?1.  Severe LV dilatation with severe systolic dysfunction (EF Q000111Q) ?  ?2.  Moderate RV dilatation with severe systolic dysfunction (EF 123456) ?  ?3. 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  ?4. Basal septal midwall LGE, which is a scar pattern seen in ?nonischemic cardiomyopathies and associated with worse prognosis ?  ?5. RV insertion site LGE, which is a nonspecific finding often seen ?in setting of elevated pulmonary pressures ?  ?6.  Moderate mitral regurgitation (regurgitant fraction 32%) ?  ?7.  Small pericardial effusion ? ?EKG:  EKG is personally reviewed.  ?09/22/2021: Sinus rhythm ?09/16/2021: NSR post cardioversion ? ?Recent Labs: ?09/15/2021: ALT 12; B Natriuretic Peptide 66.2; Hemoglobin 13.9; Magnesium 1.8; Platelets 231; TSH 1.303 ?09/17/2021: BUN 20; Creatinine, Ser 1.34; Potassium 3.5; Sodium 137   ? ?Recent Lipid Panel ?No results found for: CHOL, TRIG, HDL, CHOLHDL, VLDL, LDLCALC, LDLDIRECT ? ?Physical Exam:   ? ?VS:  BP 100/70   Pulse (!) 58   Ht 6' (1.829 m)   Wt 189 lb (85.7 kg)   SpO2 96%   BMI 25.63 kg/m?    ? ?Wt Readings from Last 3 Encounters:  ?09/22/21 189 lb (85.7 kg)  ?09/17/21 183 lb 14.4 oz (83.4 kg)  ?04/21/21 196 lb (88.9 kg)  ?  ? ?GEN: Well nourished, well developed in no acute distress ?HEENT: Normal ?NECK: No JVD; No carotid bruits ?LYMPHATICS: No lymphadenopathy ?CARDIAC: RRR, no murmurs, rubs, gallops ?RESPIRATORY:  Clear to auscultation without rales, wheezing or rhonchi  ?ABDOMEN: Soft, non-tender, non-distended ?MUSCULOSKELETAL:  No edema; No deformity  ?SKIN: Warm and dry ?NEUROLOGIC:  Alert and oriented x 3 ?PSYCHIATRIC:  Normal affect  ? ? ? ?  ? ?  ASSESSMENT:   ? ?1. Typical atrial flutter ([place])   ?2. Chronic systolic congestive heart failure ([place])   ?3. Alcohol abuse   ?4. Encounter for long-term (current) use of high-risk medication   ? ?PLAN:   ? ?In order of problems listed above: ? ?#Typical atrial flutter ?Maintaining normal rhythm on amiodarone.  On Eliquis for stroke prophylaxis. ? ?Discussed rhythm control options for the patient including continued amiodarone use, alternative antiarrhythmic drug therapy versus catheter ablation.  Given his young age and history of chronic systolic heart failure, I do think a rhythm control strategy is important.  Given his young age I think avoiding amiodarone any associated off target effects is important.  I think he is a good candidate for catheter ablation.  I discussed the procedure in detail with the patient including the risks, recovery and likelihood of success.  I discussed the possibility of developing atrial fibrillation after a successful atrial flutter ablation.  He understands all of this and wishes to proceed. ? ?Risk, benefits, and alternatives to EP study and radiofrequency ablation for atrial flutter were also  discussed in detail today. These risks include but are not limited to stroke, bleeding, vascular damage, tamponade, perforation, damage to the esophagus, lungs, and other structures, pulmonary vein stenosis, worsening ren

## 2021-09-22 NOTE — Patient Instructions (Addendum)
Medication Instructions:  ?Your physician recommends that you continue on your current medications as directed. Please refer to the Current Medication list given to you today. ?*If you need a refill on your cardiac medications before your next appointment, please call your pharmacy* ? ?Lab Work: ?None. ?If you have labs (blood work) drawn today and your tests are completely normal, you will receive your results only by: ?MyChart Message (if you have MyChart) OR ?A paper copy in the mail ?If you have any lab test that is abnormal or we need to change your treatment, we will call you to review the results. ? ?Testing/Procedures: ?Your physician has recommended that you have an ablation. Catheter ablation is a medical procedure used to treat some cardiac arrhythmias (irregular heartbeats). During catheter ablation, a long, thin, flexible tube is put into a blood vessel in your groin (upper thigh), or neck. This tube is called an ablation catheter. It is then guided to your heart through the blood vessel. Radio frequency waves destroy small areas of heart tissue where abnormal heartbeats may cause an arrhythmia to start. Please see the instruction sheet given to you today. ? ? ?Follow-Up: ?At [facility], you and your health needs are our priority.  As part of our continuing mission to provide you with exceptional heart care, we have created designated Provider Care Teams.  These Care Teams include your primary Cardiologist (physician) and Advanced Practice Providers (APPs -  Physician Assistants and Nurse Practitioners) who all work together to provide you with the care you need, when you need it. ? ?Your physician wants you to follow-up in: I will call you once your ablation is completed. The date is June 13. ? ?We recommend signing up for the patient portal called "MyChart".  Sign up information is provided on this After Visit Summary.  MyChart is used to connect with patients for Virtual Visits (Telemedicine).   Patients are able to view lab/test results, encounter notes, upcoming appointments, etc.  Non-urgent messages can be sent to your provider as well.   ?To learn more about what you can do with MyChart, go to [email or web address].   ? ?Any Other Special Instructions Will Be Listed Below (If Applicable). ? ?Cardiac Ablation ?Cardiac ablation is a procedure to destroy (ablate) some heart tissue that is sending bad signals. These bad signals cause problems in heart rhythm. ?The heart has many areas that make these signals. If there are problems in these areas, they can make the heart beat in a way that is not normal. Destroying some tissues can help make the heart rhythm normal. ?Tell your doctor about: ?Any allergies you have. ?All medicines you are taking. These include vitamins, herbs, eye drops, creams, and over-the-counter medicines. ?Any problems you or family members have had with medicines that make you fall asleep (anesthetics). ?Any blood disorders you have. ?Any surgeries you have had. ?Any medical conditions you have, such as kidney failure. ?Whether you are pregnant or may be pregnant. ?What are the risks? ?This is a safe procedure. But problems may occur, including: ?Infection. ?Bruising and bleeding. ?Bleeding into the chest. ?Stroke or blood clots. ?Damage to nearby areas of your body. ?Allergies to medicines or dyes. ?The need for a pacemaker if the normal system is damaged. ?Failure of the procedure to treat the problem. ?What happens before the procedure? ?Medicines ?Ask your doctor about: ?Changing or stopping your normal medicines. This is important. ?Taking aspirin and ibuprofen. Do not take these medicines unless your doctor tells you to  take them. ?Taking other medicines, vitamins, herbs, and supplements. ?General instructions ?Follow instructions from your doctor about what you cannot eat or drink. ?Plan to have someone take you home from the hospital or clinic. ?If you will be going home  right after the procedure, plan to have someone with you for 24 hours. ?Ask your doctor what steps will be taken to prevent infection. ?What happens during the procedure? ? ?An IV tube will be put into one of your veins. ?You will be given a medicine to help you relax. ?The skin on your neck or groin will be numbed. ?A cut (incision) will be made in your neck or groin. A needle will be put through your cut and into a large vein. ?A tube (catheter) will be put into the needle. The tube will be moved to your heart. ?Dye may be put through the tube. This helps your doctor see your heart. ?Small devices (electrodes) on the tube will send out signals. ?A type of energy will be used to destroy some heart tissue. ?The tube will be taken out. ?Pressure will be held on your cut. This helps stop bleeding. ?A bandage will be put over your cut. ?The exact procedure may vary among doctors and hospitals. ?What happens after the procedure? ?You will be watched until you leave the hospital or clinic. This includes checking your heart rate, breathing rate, oxygen, and blood pressure. ?Your cut will be watched for bleeding. You will need to lie still for a few hours. ?Do not drive for 24 hours or as long as your doctor tells you. ?Summary ?Cardiac ablation is a procedure to destroy some heart tissue. This is done to treat heart rhythm problems. ?Tell your doctor about any medical conditions you may have. Tell him or her about all medicines you are taking to treat them. ?This is a safe procedure. But problems may occur. These include infection, bruising, bleeding, and damage to nearby areas of your body. ?Follow what your doctor tells you about food and drink. You may also be told to change or stop some of your medicines. ?After the procedure, do not drive for 24 hours or as long as your doctor tells you. ?This information is not intended to replace advice given to you by your health care provider. Make sure you discuss any questions  you have with your health care provider. ?Document Revised: 05/02/2019 Document Reviewed: 05/02/2019 ?Elsevier Patient Education ? 2022 [employer]. ? ? ? ?  ? ? ? ?

## 2021-09-28 NOTE — Progress Notes (Signed)
?Advanced [facility] Failure Clinic Note  ? ?Referring Physician: ?PCP: Storm Frisk, MD ?HF Cardiology: Dr. Shirlee Latch ? ?Reason for Visit: Heart Failure  ? ?HPI: ?Mr Hurney is a 51 y.o. with history of chronic systolic heart failure dating back to 2020, ETOH abuse, HTN, and tobacco abuse.   ?  ?Admitted back in February 2020 with increased shortness of breath. At that time he was drinking 1/5 of licquor per day. ECHO completed and showed severely reduced EF 15-20%. 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 No formal cath. He was discharged 08/08/19. He returned for one appointment with cardiology but no return f/u after that.  ? ?Admitted 10/21 for acute on chronic systolic heart failure + atrial flutter w/ RVR requiring DCCV. Also w/ low output requiring milrinone. R/LHC showed mildly elevated PCWP, normal RA pressure, normal cardiac output on milrinone and no significant coronary disease. Echo showed severe LVEF <20%, severe RV dysfunction and severe MR. He was diuresed w/ IV Lasix. Was able to wean off milrinone. Discharge wt was 186 lb.  ? ?Echo in 1/22 showed EF 25% with global hypokinesis, normal RV, trivial MR.  ?  ?Follow up 10/22, stable NYHA I and not volume overloaded. GDMT increased with elevated BP.  ? ?Admitted 4/23 with AF with RVR. Started on amio gtt. Underwent DCCV to NSR. Echo showed EF 20-25% prominent apical trabeculations. 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 Mild MR. RV mildly HK. Amio changed to oral. Discharged home, weight 183 lbs.  ? ?Saw EP 09/22/21, planning for AF ablation 11/2021. ? ?Today he returns for post hospital HF follow up with his father. Overall feeling fine. He remains abstinent from ETOH. He does not have dyspnea with walking up stairs, lifting or ADLs. Asking when he can return to work. Denies palpitations, abnormal bleeding, CP, dizziness, edema, or PND/Orthopnea. Appetite ok. No fever or chills. Weight at home 170 pounds. Taking all medications. He has been taking amio 400 bid since  discharge. ? ?ECG (personally reviewed): SB 52 bpm ? ?Labs (12/21): TSH normal, K 4.2, creatinine 1.3 ?Labs (1/22): K 3.8, creatinine 1.18 ?Labs (4/23): K 3.5, creatinine 1.34, hgb 13.9 ? ?PMH: ?1. HTN ?2. ETOH abuse ?3. Chronic systolic CHF: Nonischemic cardiomyopathy.   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  ?- LHC/RHC (10/21): No significant CAD; mean RA 4, PA 34/8, mean PCWP 18, CI 3.01.  ?- Echo (1/22):  EF 25% with global hypokinesis, normal RV, trivial MR. ?- Echo (4/23): EF 20-25% prominent apical trabeculations. 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 Mild MR. RV mildly HK  ?4. Atrial flutter: DCCV 10/21. ?- DCCV 4/23 to NSR (on amiodarone) ?5. CKD stage 3 ? ?Review of systems complete and found to be negative unless listed in HPI.   ? ?Current Outpatient Medications  ?Medication Sig Dispense Refill  ? acetaminophen (TYLENOL) 500 MG tablet Take 1,000 mg by mouth every 6 (six) hours as needed for mild pain.    ? amiodarone (PACERONE) 200 MG tablet Take 2 tablets (400 mg total) by mouth 2 (two) times daily. 75 tablet 0  ? apixaban (ELIQUIS) 5 MG TABS tablet Take 1 tablet (5 mg total) by mouth 2 (two) times daily. 180 tablet 3  ? carvedilol (COREG) 25 MG tablet Take 1 tablet (25 mg total) by mouth 2 (two) times daily. 180 tablet 3  ? dapagliflozin propanediol (FARXIGA) 10 MG TABS tablet Take 1  tablet (10 mg total) by mouth daily. 90 tablet 3  ? furosemide (LASIX) 20 MG tablet Take 1 tablet (20 mg total) by mouth daily as needed. For > 3 lb weight gain in 24 hr or > 5 lb weight gain in 1 week 30 tablet 3  ? Multiple Vitamin (MULTIVITAMIN WITH MINERALS) TABS tablet Take 1 tablet by mouth daily.    ? sacubitril-valsartan (ENTRESTO) 97-103 MG Take 1 tablet by mouth 2 (two) times daily. 180 tablet 3  ? spironolactone (ALDACTONE) 25 MG tablet Take 1 tablet by mouth once  daily 90 tablet 0  ? ?No current facility-administered medications for this encounter.  ? ? ?No Known Allergies ? ?  ?Social History  ? ?Socioeconomic History  ? Marital status: Single  ?  Spouse name: Not on file  ? Number of children: Not on file  ? Years of education: Not on file  ? Highest education level: Not on file  ?Occupational History  ? Not on file  ?Tobacco Use  ? Smoking status: Former  ?  Types: Cigars  ?  Quit date: 07/14/2018  ?  Years since quitting: 3.2  ? Smokeless tobacco: Never  ?Vaping Use  ? Vaping Use: Never used  ?Substance and Sexual Activity  ? Alcohol use: Not Currently  ?  Comment: quit 03/09/20  ? Drug use: Not Currently  ?  Types: Marijuana  ?  Comment: quit 03/09/20  ? Sexual activity: Not Currently  ?Other Topics Concern  ? Not on file  ?Social History Narrative  ? Not on file  ? ?Social Determinants of Health  ? ?Financial Resource Strain: Not on file  ?Food Insecurity: Not on file  ?Transportation Needs: Not on file  ?Physical Activity: Not on file  ?Stress: Not on file  ?Social Connections: Not on file  ?Intimate Partner Violence: Not on file  ? ? ?  ?Family History  ?Problem Relation Age of Onset  ? Hypertension Father   ? Cancer Neg Hx   ? Diabetes Neg Hx   ? ?BP 120/72   Pulse (!) 48   Wt 87.4 kg   SpO2 99%   BMI 26.12 kg/m?  ? ?Wt Readings from Last 3 Encounters:  ?10/01/21 87.4 kg  ?09/22/21 85.7 kg  ?09/17/21 83.4 kg  ? ?PHYSICAL EXAM: ?General:  NAD. No resp difficulty ?HEENT: Normal ?Neck: Supple. No JVD. Carotids 2+ bilat; no bruits. No lymphadenopathy or thryomegaly appreciated. ?Cor: PMI nondisplaced. Regular rate & rhythm. No rubs, gallops or murmurs. ?Lungs: Clear ?Abdomen: Soft, nontender, nondistended. No hepatosplenomegaly. No bruits or masses. Good bowel sounds. ?Extremities: No cyanosis, clubbing, rash, edema ?Neuro: Alert & oriented x 3, cranial nerves grossly intact. Moves all 4 extremities w/o difficulty. Affect pleasant. ? ?ASSESSMENT & PLAN: ?1. Chronic  Systolic CHF: Nonischemic cardiomyopathy.  Admission Q000111Q for acute systolic CHF w/ low output requiring milrinone. Biventricular failure, echo 10/21 EF < 20%, severe RV dysfunction, severe MR. LHC showed no significant coronary disease. CO good on milrinone, was able to wean off. 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  Echo  1/22 with EF 25%, normal RV, trivial MR. Patient has history of heavy ETOH => he quit, but started back again. Cause of cardiomyopathy likely noncompaction versus ETOH. Echo 4/23 EF 20-25% prominent apical trabeculations. 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 Mild MR. RV mildly HK.  He does not look volume overloaded on exam. Stable NYHA I symptoms. ?- Continue Coreg 25 mg bid.   ?- Continue spironolactone 25 mg daily.   ?-  Continue Entresto 97/103 mg bid ?- Continue Farxiga 10 mg daily  ?- He does not need daily loops. ?- Needs to completely stop ETOH again.  ?- Will repeat echo in 3 months now that he is off ETOH and in NSR. If EF remains < 35%, will refer for ICD, narrow QRS so no CRT-D. ?2. Atrial flutter: s/p DCCV 10/21.  Readmitted with AF RVR. Started on amiodarone and underwent DCCV (4/23) to NSR. SB on ECG today. ?- Planning on AF ablation with Dr. Quentin Ore 6/23. ?- Decrease amiodarone to 200 mg bid. (Has been taking 400 bid since discharge) ?- Continue Eliquis 5 mg bid. CBC today. ?3. ETOH abuse: No longer drinking. He understands he needs to completely abstain. ?4. Mitral regurgitation: Severe on 10/21 echo, echo in 1/22 showed only trivial MR.  ?- Mild on most recent echo.  ?  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 He was given a note for his employer, will need allowances for rest breaks and/or days off as needed. ? ?Follow up with Dr. Aundra Dubin + repeat echo in 3 months. ? ?Allena Katz, FNP-BC ?10/01/21 ? ?

## 2021-09-29 ENCOUNTER — Encounter: Payer: Self-pay | Admitting: *Deleted

## 2021-09-29 ENCOUNTER — Telehealth: Payer: Self-pay | Admitting: *Deleted

## 2021-09-29 NOTE — Telephone Encounter (Signed)
Left message to call back for procedure instructions ?

## 2021-09-30 ENCOUNTER — Telehealth (HOSPITAL_COMMUNITY): Payer: Self-pay

## 2021-09-30 NOTE — Telephone Encounter (Signed)
Went over ablation procedure instructions sent over [employer]. All questions answered. Patient verbalized understanding and agreement.  ?

## 2021-09-30 NOTE — Telephone Encounter (Signed)
Called to confirm/remind patient of their appointment at the [facility] on 10/01/21.  ? ?Patient reminded to bring all medications and/or complete list. ? ?Confirmed patient has transportation. Gave directions, instructed to utilize valet parking. ? ?Confirmed appointment prior to ending call.  ? ?

## 2021-10-01 ENCOUNTER — Encounter (HOSPITAL_COMMUNITY): Payer: Self-pay

## 2021-10-01 ENCOUNTER — Ambulatory Visit (HOSPITAL_COMMUNITY)
Admit: 2021-10-01 | Discharge: 2021-10-01 | Disposition: A | Discharge status: Home / Self Care | Payer: No Typology Code available for payment source | Attending: Family Medicine | Admitting: Family Medicine

## 2021-10-01 VITALS — BP 120/72 | HR 48 | Wt 192.6 lb

## 2021-10-01 DIAGNOSIS — I340 Nonrheumatic mitral (valve) insufficiency: Secondary | ICD-10-CM

## 2021-10-01 DIAGNOSIS — Z7901 Long term (current) use of anticoagulants: Secondary | ICD-10-CM | POA: Insufficient documentation

## 2021-10-01 DIAGNOSIS — I4892 Unspecified atrial flutter: Secondary | ICD-10-CM | POA: Diagnosis not present

## 2021-10-01 DIAGNOSIS — I483 Typical atrial flutter: Secondary | ICD-10-CM | POA: Diagnosis not present

## 2021-10-01 DIAGNOSIS — Z79899 Other long term (current) drug therapy: Secondary | ICD-10-CM | POA: Diagnosis not present

## 2021-10-01 DIAGNOSIS — F1010 Alcohol abuse, uncomplicated: Secondary | ICD-10-CM

## 2021-10-01 DIAGNOSIS — F1011 Alcohol abuse, in remission: Secondary | ICD-10-CM | POA: Diagnosis not present

## 2021-10-01 DIAGNOSIS — Z7984 Long term (current) use of oral hypoglycemic drugs: Secondary | ICD-10-CM | POA: Insufficient documentation

## 2021-10-01 DIAGNOSIS — I5022 Chronic systolic (congestive) heart failure: Secondary | ICD-10-CM

## 2021-10-01 DIAGNOSIS — N183 Chronic kidney disease, stage 3 unspecified: Secondary | ICD-10-CM | POA: Diagnosis not present

## 2021-10-01 DIAGNOSIS — I130 Hypertensive heart and chronic kidney disease with heart failure and stage 1 through stage 4 chronic kidney disease, or unspecified chronic kidney disease: Secondary | ICD-10-CM | POA: Insufficient documentation

## 2021-10-01 LAB — CBC
HCT: 40.7 % (ref 39.0–52.0)
Hemoglobin: 12.7 g/dL — ABNORMAL LOW (ref 13.0–17.0)
MCH: 31.1 pg (ref 26.0–34.0)
MCHC: 31.2 g/dL (ref 30.0–36.0)
MCV: 99.8 fL (ref 80.0–100.0)
Platelets: 245 10*3/uL (ref 150–400)
RBC: 4.08 MIL/uL — ABNORMAL LOW (ref 4.22–5.81)
RDW: 11.4 % — ABNORMAL LOW (ref 11.5–15.5)
WBC: 5.3 10*3/uL (ref 4.0–10.5)
nRBC: 0 % (ref 0.0–0.2)

## 2021-10-01 LAB — BASIC METABOLIC PANEL
Anion gap: 5 (ref 5–15)
BUN: 14 mg/dL (ref 6–20)
CO2: 27 mmol/L (ref 22–32)
Calcium: 9.4 mg/dL (ref 8.9–10.3)
Chloride: 107 mmol/L (ref 98–111)
Creatinine, Ser: 1.32 mg/dL — ABNORMAL HIGH (ref 0.61–1.24)
GFR, Estimated: >60 mL/min (ref >60)
Glucose, Bld: 91 mg/dL (ref 70–99)
Potassium: 4.1 mmol/L (ref 3.5–5.1)
Sodium: 139 mmol/L (ref 135–145)

## 2021-10-01 MED ORDER — AMIODARONE HCL 200 MG PO TABS: 200.0000 mg | ORAL_TABLET | Freq: Two times a day (BID) | ORAL | 3 refills | Status: DC

## 2021-10-01 NOTE — Patient Instructions (Addendum)
EKG done today. ? ?Labs done today. We will contact you only if your labs are abnormal. ? ?DECREASE Amiodarone to 200mg  (1 tablet) by mouth 2 times daily. ? ?No other medication changes were made. Please continue all current medications as prescribed. ? ?Your physician recommends that you schedule a follow-up appointment in: 3 months with an echo prior to your exam. ? ?If you have any questions or concerns before your next appointment please send [place] a message through [place] or call our office at [phone].   ? ?TO LEAVE A MESSAGE FOR THE NURSE SELECT OPTION 2, PLEASE LEAVE A MESSAGE INCLUDING: ?YOUR NAME ?DATE OF BIRTH ?CALL BACK NUMBER ?REASON FOR CALL**this is important as we prioritize the call backs ? ?YOU WILL RECEIVE A CALL BACK THE SAME DAY AS LONG AS YOU CALL BEFORE 4:00 PM ? ? ?Do the following things EVERYDAY: ?Weigh yourself in the morning before breakfast. Write it down and keep it in a log. ?Take your medicines as prescribed ?Eat low salt foods--Limit salt (sodium) to 2000 mg per day.  ?Stay as active as you can everyday ?Limit all fluids for the day to less than 2 liters ? ? ?At the [facility], you and your health needs are our priority. As part of our continuing mission to provide you with exceptional heart care, we have created designated Provider Care Teams. These Care Teams include your primary Cardiologist (physician) and Advanced Practice Providers (APPs- Physician Assistants and Nurse Practitioners) who all work together to provide you with the care you need, when you need it.  ? ?You may see any of the following providers on your designated Care Team at your next follow up: ?Dr Glori Bickers ?Dr [name] ?Darrick Grinder, NP ?Lyda Jester, PA ?Audry Riles, PharmD ? ? ?Please be sure to bring in all your medications bottles to every appointment.  ? ?

## 2021-10-13 ENCOUNTER — Other Ambulatory Visit: Payer: Self-pay

## 2021-10-13 ENCOUNTER — Ambulatory Visit: Payer: MEDICAID | Attending: Physician Assistant | Admitting: Physician Assistant

## 2021-10-13 ENCOUNTER — Encounter: Payer: Self-pay | Admitting: Physician Assistant

## 2021-10-13 VITALS — BP 112/75 | HR 65 | Resp 18 | Ht 72.0 in | Wt 192.0 lb

## 2021-10-13 DIAGNOSIS — I5041 Acute combined systolic (congestive) and diastolic (congestive) heart failure: Secondary | ICD-10-CM

## 2021-10-13 DIAGNOSIS — I4892 Unspecified atrial flutter: Secondary | ICD-10-CM | POA: Diagnosis not present

## 2021-10-13 DIAGNOSIS — Z7689 Persons encountering health services in other specified circumstances: Secondary | ICD-10-CM | POA: Diagnosis not present

## 2021-10-13 DIAGNOSIS — Z09 Encounter for follow-up examination after completed treatment for conditions other than malignant neoplasm: Secondary | ICD-10-CM

## 2021-10-13 DIAGNOSIS — F1910 Other psychoactive substance abuse, uncomplicated: Secondary | ICD-10-CM

## 2021-10-13 MED ORDER — DAPAGLIFLOZIN PROPANEDIOL 10 MG PO TABS: 10.0000 mg | ORAL_TABLET | Freq: Every day | ORAL | 3 refills | Status: DC

## 2021-10-13 NOTE — Patient Instructions (Signed)
[email or web address] is the website for narcotics anonymous [email or web address]org (website) or [phone] is the information for alcoholics anonymous Both are free and immediately available for help with alcohol and drug use  

## 2021-10-13 NOTE — Progress Notes (Signed)
Patient ID: Bryan Guerrero, male   DOB: 08-09-1970, 51 y.o.   MRN: [id number] ? ? ? ?  ? ?  ? 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 ?[id number] ? ?[id number] ? ?DOB - Nov 13, 1970 ? ?Chief Complaint  ?Patient presents with  ? Follow-up  ?  Hospital follow- up  ?    ? ?Subjective:  ? ?Bryan Guerrero is a 51 y.o. male here today for a follow up visit and to establish care After hospitalization 4/5-4/7.  Seen by cardiology and CHF clinic 4/12 and 10/01/2021 respectively.  He has not been using substances or drinking alcohol.  Weights stable.  No SOB.  No CP.  Meds are expensive. He wants to see if any are cheaper here.  No new issues or concerns.  He does need RF on farxiga.   ? ?From discharge summary: ?Primary Discharge Diagnoses:  ?Atrial Flutter w/ RVR, s/p DCCV ?A/c Biventricular Heart Failure/ NICM  ?ETOH Abuse  ?Hypokalemia ?  ?  ?Hospital Course: ?  ?Mr Bruening is a 51 y.o. with history of chronic systolic heart failure dating back to 2020, ETOH abuse, HTN, and tobacco abuse.   ?  ?Admitted back in February 2020 with increased shortness of breath. At that time he was drinking 1/5 of licquor per day. ECHO completed and showed severely reduced EF 15-20%. 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 No formal cath. He was discharged 08/08/19. He returned for one appointment with cardiology but no return f/u after that.  ?  ?Admitted 10/21 for acute on chronic systolic heart failure + atrial flutter w/ RVR requiring DCCV. Also w/ low output requiring milrinone. R/LHC showed mildly elevated PCWP, normal RA pressure, normal cardiac output on milrinone and no significant coronary disease. Echo showed severe LVEF <20%, severe RV dysfunction and severe MR. He was diuresed w/ IV Lasix. Was able to wean off milrinone. Discharge wt was 186 lb.  ?  ?Echo in 1/22 showed EF 25% with global hypokinesis, normal RV, trivial MR. ?  ?Presented to the ED on 4/6 w/ two days of worsening shortness of breath with exertion, lethargy, and  dizziness. Found to be in rapid atrial flutter and a/c CHF w/ mild volume overload. Serum etOH was positive. He admitted to resuming etOH use but less than before; a couple beers a day. He reported being fully compliance w/ Eliquis w/o any missed doses in the last 30 days.  ?  ?He was admitted and placed on amio gtt. Continued on Eliquis. IV Lasix given for CHF w/ good diuresis. ?  ?Echo showed EF 20-25%, RV low normal. Only mild MR  ?  ?He underwent successful DCCV back to NSR. He was monitored an additional night and remained in NSR. IV amio converted to PO amio. Case was d/w EP and he will be seen in outpatient EP clinic in 1 week to discuss AFL ablation. Cessation from ETOH recommended.   ?  ?On 4/8, he was last seen and examined by Dr. Haroldine Laws and felt stable for d/c home. Consultation arranged w/ Dr. Quentin Ore on 4/12. Has f/u in the [facility] on 4/21.  ?  ?Also prior home meds/ GDMT resumed. PRN lasix ordered at d/c for wt gain.  ? ?From cardiology plan 09/22/2021: ? ?#Typical atrial flutter ?Maintaining normal rhythm on amiodarone.  On Eliquis for stroke prophylaxis. ? ?Discussed rhythm control options for the patient including continued amiodarone use, alternative antiarrhythmic drug therapy versus catheter ablation.  Given his young age and history of chronic systolic  heart failure, I do think a rhythm control strategy is important.  Given his young age I think avoiding amiodarone any associated off target effects is important.  I think he is a good candidate for catheter ablation.  I discussed the procedure in detail with the patient including the risks, recovery and likelihood of success.  I discussed the possibility of developing atrial fibrillation after a successful atrial flutter ablation.  He understands all of this and wishes to proceed. ?  ?Risk, benefits, and alternatives to EP study and radiofrequency ablation for atrial flutter were also discussed in detail today. These risks include but are not  limited to stroke, bleeding, vascular damage, tamponade, perforation, damage to the esophagus, lungs, and other structures, pulmonary vein stenosis, worsening renal function, and death. The patient understands these risk and wishes to proceed.  We will therefore proceed with catheter ablation at the next available time.  Carto, ICE, anesthesia are requested for the procedure.  ?  ?#Chronic systolic heart failure ?NYHA class II.  Warm and dry on exam.  Follows with Dr. Aundra Dubin in the heart failure clinic.  On good medical therapy.  If, after maintaining normal rhythm for at least 3 months following good medical therapy, his ejection fraction remains severely reduced, will need to discuss defibrillator implant. ? ?#Alcohol abuse ?Abstinence encouraged ? ? ?From CHF clinic A/P: ?ASSESSMENT & PLAN: ?1. Chronic Systolic CHF: Nonischemic cardiomyopathy.  Admission Q000111Q for acute systolic CHF w/ low output requiring milrinone. Biventricular failure, echo 10/21 EF < 20%, severe RV dysfunction, severe MR. LHC showed no significant coronary disease. CO good on milrinone, was able to wean off. 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  Echo  1/22 with EF 25%, normal RV, trivial MR. Patient has history of heavy ETOH => he quit, but started back again. Cause of cardiomyopathy likely noncompaction versus ETOH. Echo 4/23 EF 20-25% prominent apical trabeculations. 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 Mild MR. RV mildly HK.  He does not look volume overloaded on exam. Stable NYHA I symptoms. ?- Continue Coreg 25 mg bid.   ?- Continue spironolactone 25 mg daily.   ?- Continue Entresto 97/103 mg bid ?- Continue Farxiga 10 mg daily  ?- He does not need daily loops. ?- Needs to completely stop ETOH again.  ?- Will repeat echo in 3 months now that he is off ETOH and in NSR. If EF remains < 35%, will refer for ICD, narrow QRS so no CRT-D. ?2. Atrial flutter: s/p DCCV 10/21.  Readmitted with AF RVR. Started on amiodarone  and underwent DCCV (4/23) to NSR. SB on ECG today. ?- Planning on AF ablation with Dr. Quentin Ore 6/23. ?- Decrease amiodarone to 200 mg bid. (Has been taking 400 bid since discharge) ?- Continue Eliquis 5 mg bid. CBC today. ?3. ETOH abuse: No longer drinking. He understands he needs to completely abstain. ?4. Mitral regurgitation: Severe on 10/21 echo, echo in 1/22 showed only trivial MR.  ?- Mild on most recent echo.  ?  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 He was given a note for his employer, will need allowances for rest breaks and/or days off as needed. ? ? ? ?Patient has No headache, No chest pain, No abdominal pain - No Nausea, No new weakness tingling or numbness, No Cough - SOB. ? ? ?No problems updated. ? ?ALLERGIES: ?No Known Allergies ? ?PAST MEDICAL HISTORY: ?Past Medical History:  ?  Diagnosis Date  ? Alcohol abuse   ? Asthma   ? Cardiomyopathy ([place])   ? Dyspnea   ? Hypertension   ? ? ?MEDICATIONS AT HOME: ?Prior to Admission medications   ?Medication Sig Start Date End Date Taking? Authorizing Provider  ?acetaminophen (TYLENOL) 500 MG tablet Take 1,000 mg by mouth every 6 (six) hours as needed for mild pain.   Yes [provider]  ?amiodarone (PACERONE) 200 MG tablet Take 1 tablet (200 mg total) by mouth 2 (two) times daily. 10/01/21  Yes [place], Maricela Bo, FNP  ?apixaban (ELIQUIS) 5 MG TABS tablet Take 1 tablet (5 mg total) by mouth 2 (two) times daily. 04/21/21  Yes Larey Dresser, MD  ?carvedilol (COREG) 25 MG tablet Take 1 tablet (25 mg total) by mouth 2 (two) times daily. 04/21/21  Yes Larey Dresser, MD  ?furosemide (LASIX) 20 MG tablet Take 1 tablet (20 mg total) by mouth daily as needed. For > 3 lb weight gain in 24 hr or > 5 lb weight gain in 1 week 09/17/21 09/17/22 Yes Consuelo Pandy, PA-C  ?Multiple Vitamin (MULTIVITAMIN WITH MINERALS) TABS tablet Take 1 tablet by mouth daily.   Yes [provider]   ?sacubitril-valsartan (ENTRESTO) 97-103 MG Take 1 tablet by mouth 2 (two) times daily. 03/31/21  Yes Larey Dresser, MD  ?spironolactone (ALDACTONE) 25 MG tablet Take 1 tablet by mouth once daily 08/09/21

## 2021-11-03 ENCOUNTER — Other Ambulatory Visit: Payer: No Typology Code available for payment source | Admitting: *Deleted

## 2021-11-03 DIAGNOSIS — I483 Typical atrial flutter: Secondary | ICD-10-CM

## 2021-11-03 LAB — CBC WITH DIFFERENTIAL/PLATELET
Basophils Absolute: 0.1 10*3/uL (ref 0.0–0.2)
Basos: 1 %
EOS (ABSOLUTE): 0.2 10*3/uL (ref 0.0–0.4)
Eos: 3 %
Hematocrit: 36.6 % — ABNORMAL LOW (ref 37.5–51.0)
Hemoglobin: 12.4 g/dL — ABNORMAL LOW (ref 13.0–17.7)
Immature Grans (Abs): 0 10*3/uL (ref 0.0–0.1)
Immature Granulocytes: 0 %
Lymphocytes Absolute: 1.8 10*3/uL (ref 0.7–3.1)
Lymphs: 28 %
MCH: 30.9 pg (ref 26.6–33.0)
MCHC: 33.9 g/dL (ref 31.5–35.7)
MCV: 91 fL (ref 79–97)
Monocytes Absolute: 0.7 10*3/uL (ref 0.1–0.9)
Monocytes: 11 %
Neutrophils Absolute: 3.5 10*3/uL (ref 1.4–7.0)
Neutrophils: 57 %
Platelets: 230 10*3/uL (ref 150–450)
RBC: 4.01 x10E6/uL — ABNORMAL LOW (ref 4.14–5.80)
RDW: 11.2 % — ABNORMAL LOW (ref 11.6–15.4)
WBC: 6.2 10*3/uL (ref 3.4–10.8)

## 2021-11-03 LAB — BASIC METABOLIC PANEL
BUN/Creatinine Ratio: 12 (ref 9–20)
BUN: 16 mg/dL (ref 6–24)
CO2: 24 mmol/L (ref 20–29)
Calcium: 8.7 mg/dL (ref 8.7–10.2)
Chloride: 107 mmol/L — ABNORMAL HIGH (ref 96–106)
Creatinine, Ser: 1.3 mg/dL — ABNORMAL HIGH (ref 0.76–1.27)
Glucose: 107 mg/dL — ABNORMAL HIGH (ref 70–99)
Potassium: 4.1 mmol/L (ref 3.5–5.2)
Sodium: 142 mmol/L (ref 134–144)
eGFR: 68 mL/min/{1.73_m2} (ref >59)

## 2021-11-22 NOTE — Pre-Procedure Instructions (Signed)
Instructed patient on the following items: Arrival time 1100 Nothing to eat or drink after midnight No meds AM of procedure Responsible person to drive you home and stay with you for 24 hrs  Have you missed any doses of anti-coagulant Eliquis- hasn't missed any doses    

## 2021-11-23 ENCOUNTER — Ambulatory Visit (HOSPITAL_COMMUNITY)
Admission: RE | Admit: 2021-11-23 | Discharge: 2021-11-23 | Disposition: A | Discharge status: Home / Self Care | Payer: No Typology Code available for payment source | Source: Ambulatory Visit | Attending: Cardiology | Admitting: Cardiology

## 2021-11-23 ENCOUNTER — Encounter (HOSPITAL_COMMUNITY)
Admission: RE | Disposition: A | Discharge status: Home / Self Care | Payer: No Typology Code available for payment source | Source: Ambulatory Visit | Attending: Cardiology

## 2021-11-23 ENCOUNTER — Ambulatory Visit (HOSPITAL_COMMUNITY): Payer: No Typology Code available for payment source | Admitting: Anesthesiology

## 2021-11-23 ENCOUNTER — Ambulatory Visit (HOSPITAL_BASED_OUTPATIENT_CLINIC_OR_DEPARTMENT_OTHER): Payer: No Typology Code available for payment source | Admitting: Anesthesiology

## 2021-11-23 ENCOUNTER — Other Ambulatory Visit: Payer: Self-pay

## 2021-11-23 DIAGNOSIS — I110 Hypertensive heart disease with heart failure: Secondary | ICD-10-CM | POA: Diagnosis not present

## 2021-11-23 DIAGNOSIS — I5022 Chronic systolic (congestive) heart failure: Secondary | ICD-10-CM

## 2021-11-23 DIAGNOSIS — F1010 Alcohol abuse, uncomplicated: Secondary | ICD-10-CM | POA: Diagnosis not present

## 2021-11-23 DIAGNOSIS — I483 Typical atrial flutter: Secondary | ICD-10-CM | POA: Insufficient documentation

## 2021-11-23 DIAGNOSIS — Z87891 Personal history of nicotine dependence: Secondary | ICD-10-CM | POA: Insufficient documentation

## 2021-11-23 DIAGNOSIS — Z7901 Long term (current) use of anticoagulants: Secondary | ICD-10-CM | POA: Insufficient documentation

## 2021-11-23 DIAGNOSIS — I509 Heart failure, unspecified: Secondary | ICD-10-CM | POA: Diagnosis not present

## 2021-11-23 DIAGNOSIS — I4892 Unspecified atrial flutter: Secondary | ICD-10-CM | POA: Diagnosis not present

## 2021-11-23 DIAGNOSIS — I4891 Unspecified atrial fibrillation: Secondary | ICD-10-CM | POA: Diagnosis not present

## 2021-11-23 DIAGNOSIS — F17210 Nicotine dependence, cigarettes, uncomplicated: Secondary | ICD-10-CM

## 2021-11-23 HISTORY — PX: A-FLUTTER ABLATION: EP1230

## 2021-11-23 SURGERY — A-FLUTTER ABLATION
Anesthesia: General

## 2021-11-23 MED ORDER — HEPARIN SODIUM (PORCINE) 1000 UNIT/ML IJ SOLN
INTRAMUSCULAR | Status: AC
  Filled 2021-11-23: qty 10

## 2021-11-23 MED ORDER — FENTANYL CITRATE (PF) 250 MCG/5ML IJ SOLN
INTRAMUSCULAR | Status: DC | PRN
  Administered 2021-11-23: 100 ug via INTRAVENOUS

## 2021-11-23 MED ORDER — LIDOCAINE 2% (20 MG/ML) 5 ML SYRINGE
INTRAMUSCULAR | Status: DC | PRN
  Administered 2021-11-23: 100 mg via INTRAVENOUS

## 2021-11-23 MED ORDER — HEPARIN SODIUM (PORCINE) 1000 UNIT/ML IJ SOLN
INTRAMUSCULAR | Status: DC | PRN
  Administered 2021-11-23: 1000 [IU] via INTRAVENOUS

## 2021-11-23 MED ORDER — SODIUM CHLORIDE 0.9 % IV SOLN: INTRAVENOUS | Status: DC

## 2021-11-23 MED ORDER — HEPARIN (PORCINE) IN NACL 1000-0.9 UT/500ML-% IV SOLN
INTRAVENOUS | Status: DC | PRN
  Administered 2021-11-23 (×3): 500 mL

## 2021-11-23 MED ORDER — SODIUM CHLORIDE 0.9 % IV SOLN: 250.0000 mL | INTRAVENOUS | Status: DC | PRN

## 2021-11-23 MED ORDER — PROPOFOL 10 MG/ML IV BOLUS
INTRAVENOUS | Status: DC | PRN
  Administered 2021-11-23: 20 mg via INTRAVENOUS
  Administered 2021-11-23: 50 mg via INTRAVENOUS
  Administered 2021-11-23: 100 mg via INTRAVENOUS

## 2021-11-23 MED ORDER — HEPARIN (PORCINE) IN NACL 1000-0.9 UT/500ML-% IV SOLN
INTRAVENOUS | Status: AC
  Filled 2021-11-23: qty 1000

## 2021-11-23 MED ORDER — ONDANSETRON HCL 4 MG/2ML IJ SOLN: 4.0000 mg | Freq: Four times a day (QID) | INTRAMUSCULAR | Status: DC | PRN

## 2021-11-23 MED ORDER — APIXABAN 5 MG PO TABS
5.0000 mg | ORAL_TABLET | Freq: Two times a day (BID) | ORAL | Status: DC
  Administered 2021-11-23: 5 mg via ORAL
  Filled 2021-11-23: qty 1

## 2021-11-23 MED ORDER — ACETAMINOPHEN 325 MG PO TABS
650.0000 mg | ORAL_TABLET | ORAL | Status: DC | PRN
Start: 2021-11-23 — End: 2021-11-24

## 2021-11-23 MED ORDER — SODIUM CHLORIDE 0.9% FLUSH: 3.0000 mL | INTRAVENOUS | Status: DC | PRN

## 2021-11-23 MED ORDER — ONDANSETRON HCL 4 MG/2ML IJ SOLN
INTRAMUSCULAR | Status: DC | PRN
  Administered 2021-11-23: 4 mg via INTRAVENOUS

## 2021-11-23 MED ORDER — DEXAMETHASONE SODIUM PHOSPHATE 10 MG/ML IJ SOLN
INTRAMUSCULAR | Status: DC | PRN
Start: 2021-11-23 — End: 2021-11-23
  Administered 2021-11-23: 4 mg via INTRAVENOUS

## 2021-11-23 MED ORDER — SODIUM CHLORIDE 0.9% FLUSH: 3.0000 mL | Freq: Two times a day (BID) | INTRAVENOUS | Status: DC

## 2021-11-23 MED ORDER — HEPARIN (PORCINE) IN NACL 1000-0.9 UT/500ML-% IV SOLN
INTRAVENOUS | Status: AC
  Filled 2021-11-23: qty 500

## 2021-11-23 MED ORDER — ROCURONIUM BROMIDE 10 MG/ML (PF) SYRINGE
PREFILLED_SYRINGE | INTRAVENOUS | Status: DC | PRN
  Administered 2021-11-23: 70 mg via INTRAVENOUS

## 2021-11-23 SURGICAL SUPPLY — 15 items
CATH 8FR REPROCESSED SOUNDSTAR (CATHETERS) ×2 IMPLANT
CATH 8FR SOUNDSTAR REPROCESSED (CATHETERS) IMPLANT
CATH SMTCH THERMOCOOL SF FJ (CATHETERS) ×1 IMPLANT
CATH WEB BI DIR CSDF CRV REPRO (CATHETERS) ×1 IMPLANT
CLOSURE PERCLOSE PROSTYLE (VASCULAR PRODUCTS) ×3 IMPLANT
COVER SWIFTLINK CONNECTOR (BAG) ×1 IMPLANT
PACK EP LATEX FREE (CUSTOM PROCEDURE TRAY) ×2
PACK EP LF (CUSTOM PROCEDURE TRAY) ×1 IMPLANT
PAD DEFIB RADIO PHYSIO CONN (PAD) ×2 IMPLANT
PATCH CARTO3 (PAD) ×1 IMPLANT
SHEATH CARTO VIZIGO SM CVD (SHEATH) ×1 IMPLANT
SHEATH PINNACLE 8F 10CM (SHEATH) ×2 IMPLANT
SHEATH PINNACLE 9F 10CM (SHEATH) ×1 IMPLANT
SHEATH PROBE COVER 6X72 (BAG) ×1 IMPLANT
TUBING SMART ABLATE COOLFLOW (TUBING) ×1 IMPLANT

## 2021-11-23 NOTE — Progress Notes (Signed)
Pt arrived to [facility] without orders, Dr is now in another case, I paged Dr without call back, I called and spoke with PA Andy/EP and he is not here to assist. Will continue to try to get orders.

## 2021-11-23 NOTE — Discharge Instructions (Signed)

## 2021-11-23 NOTE — Transfer of Care (Signed)
Immediate Anesthesia Transfer of Care Note  Patient: Bryan Guerrero  Procedure(s) Performed: A-FLUTTER ABLATION  Patient Location: Cath Lab  Anesthesia Type:General  Level of Consciousness: awake, alert  and oriented  Airway & Oxygen Therapy: Patient Spontanous Breathing and Patient connected to nasal cannula oxygen  Post-op Assessment: Report given to RN and Post -op Vital signs reviewed and stable  Post vital signs: Reviewed and stable  Last Vitals:  Vitals Value Taken Time  BP    Temp    Pulse 67 11/23/21 1533  Resp 13 11/23/21 1533  SpO2 98 % 11/23/21 1533  Vitals shown include unvalidated device data.  Last Pain:  Vitals:   11/23/21 1206  TempSrc:   PainSc: 0-No pain         Complications: No notable events documented.

## 2021-11-23 NOTE — H&P (Signed)
Electrophysiology Office Follow up Visit Note:     Date:  11/23/2021    ID:  Bryan Guerrero, DOB Nov 04, 1970, MRN [id number]   PCP:  Storm Frisk, MD    [facility] Cardiologist:  Thurmon Fair, MD  [facility] Electrophysiologist:  Lanier Prude, MD      Interval History:     Bryan Guerrero is a 51 y.o. male who presents for a follow up visit.  I last saw the patient April 14, 2020 for atrial flutter.  He been previously cardioverted for atrial flutter and maintained on amiodarone for suppression.  He is on Eliquis for stroke prophylaxis.  He has a history of chronic systolic heart failure and is followed by Dr. Shirlee Latch in the heart failure clinic.  He was admitted again early April 2023 for atrial flutter and decompensated heart failure.  He was cardioverted back to sinus rhythm and presents today for follow-up to discuss atrial flutter ablation.   He is accompanied by his father. Today, he appears well. We reviewed at length the procedural details of atrial flutter ablation.   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 In the past few weeks he has noticed some shortness of breath, but this was not an issue prior to then.   At work he passed out once previously.   He denies any palpitations, chest pain, or peripheral edema. No lightheadedness, headaches, orthopnea, or PND.    Plan for CTI ablation today.   Objective      Past Medical History:  Diagnosis Date   Alcohol abuse     Asthma     Cardiomyopathy (HCC)     Dyspnea     Hypertension             Past Surgical History:  Procedure Laterality Date   CARDIOVERSION N/A 03/17/2020    Procedure: CARDIOVERSION;  Surgeon: Lewayne Bunting, MD;  Location: [facility] ENDOSCOPY;  Service: Cardiovascular;  Laterality: N/A;   CARDIOVERSION N/A 09/16/2021    Procedure: CARDIOVERSION;  Surgeon: Laurey Morale, MD;  Location: [facility] ENDOSCOPY;  Service: Cardiovascular;  Laterality: N/A;   RIGHT/LEFT HEART CATH  AND CORONARY ANGIOGRAPHY N/A 03/19/2020    Procedure: RIGHT/LEFT HEART CATH AND CORONARY ANGIOGRAPHY;  Surgeon: Laurey Morale, MD;  Location: [facility] INVASIVE CV LAB;  Service: Cardiovascular;  Laterality: N/A;   TEE WITHOUT CARDIOVERSION   10/052021   TEE WITHOUT CARDIOVERSION N/A 03/17/2020    Procedure: TRANSESOPHAGEAL ECHOCARDIOGRAM (TEE);  Surgeon: Lewayne Bunting, MD;  Location: [facility] ENDOSCOPY;  Service: Cardiovascular;  Laterality: N/A;      Current Medications: Active Medications      Current Meds  Medication Sig   acetaminophen (TYLENOL) 500 MG tablet Take 1,000 mg by mouth every 6 (six) hours as needed for mild pain.   amiodarone (PACERONE) 200 MG tablet Take 2 tablets (400 mg total) by mouth 2 (two) times daily.   apixaban (ELIQUIS) 5 MG TABS tablet Take 1 tablet (5 mg total) by mouth 2 (two) times daily.   carvedilol (COREG) 25 MG tablet Take 1 tablet (25 mg total) by mouth 2 (two) times daily.   dapagliflozin propanediol (FARXIGA) 10 MG TABS tablet Take 1 tablet (10 mg total) by mouth daily.   furosemide (LASIX) 20 MG tablet Take 1 tablet (20 mg total) by mouth daily as needed. For > 3 lb weight gain in 24 hr or > 5 lb weight gain in 1 week  Multiple Vitamin (MULTIVITAMIN WITH MINERALS) TABS tablet Take 1 tablet by mouth daily.   sacubitril-valsartan (ENTRESTO) 97-103 MG Take 1 tablet by mouth 2 (two) times daily.   spironolactone (ALDACTONE) 25 MG tablet Take 1 tablet by mouth once daily        Allergies:   Patient has no known allergies.    Social History         Socioeconomic History   Marital status: Single      Spouse name: Not on file   Number of children: Not on file   Years of education: Not on file   Highest education level: Not on file  Occupational History   Not on file  Tobacco Use   Smoking status: Former      Types: Cigars      Quit date: 07/14/2018      Years since quitting: 3.1   Smokeless tobacco: Never  Vaping Use   Vaping Use: Never used   Substance and Sexual Activity   Alcohol use: Not Currently      Comment: quit 03/09/20   Drug use: Not Currently      Types: Marijuana      Comment: quit 03/09/20   Sexual activity: Not Currently  Other Topics Concern   Not on file  Social History Narrative   Not on file    Social Determinants of Health    Financial Resource Strain: Not on file  Food Insecurity: Not on file  Transportation Needs: Not on file  Physical Activity: Not on file  Stress: Not on file  Social Connections: Not on file      Family History: The patient's family history includes Hypertension in his father. There is no history of Cancer or Diabetes.   ROS:   Please see the history of present illness.    All other systems reviewed and are negative.   EKGs/Labs/Other Studies Reviewed:     The following studies were reviewed today:   September 15, 2021 EKG shows typical appearing atrial flutter with rapid ventricular rate and frequent PVCs   September 17, 2021 echo Left ventricular function severely decreased, 20% Right ventricular function low normal Moderately dilated left atrium Trivial MR   March 20, 2020, Cardiac MRI IMPRESSION: 1.  Severe LV dilatation with severe systolic dysfunction (EF Q000111Q)   2.  Moderate RV dilatation with severe systolic dysfunction (EF 123456)   3. 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 Basal septal midwall LGE, which is a scar pattern seen in nonischemic cardiomyopathies and associated with worse prognosis   5. RV insertion site LGE, which is a nonspecific finding often seen in setting of elevated pulmonary pressures   6.  Moderate mitral regurgitation (regurgitant fraction 32%)   7.  Small pericardial effusion   EKG:  EKG is personally reviewed.  09/22/2021: Sinus rhythm 09/16/2021: NSR post cardioversion   Recent Labs: 09/15/2021: ALT 12; B Natriuretic  Peptide 66.2; Hemoglobin 13.9; Magnesium 1.8; Platelets 231; TSH 1.303 09/17/2021: BUN 20; Creatinine, Ser 1.34; Potassium 3.5; Sodium 137    Recent Lipid Panel Labs (Brief)  No results found for: CHOL, TRIG, HDL, CHOLHDL, VLDL, LDLCALC, LDLDIRECT     Physical Exam:     VS:  BP 158/90   Pulse 51   Ht 6' (1.829 m)   Wt 189 lb (85.7 kg)   SpO2 96%   BMI 25.63 kg/m  Wt Readings from Last 3 Encounters:  09/22/21 189 lb (85.7 kg)  09/17/21 183 lb 14.4 oz (83.4 kg)  04/21/21 196 lb (88.9 kg)      GEN: Well nourished, well developed in no acute distress HEENT: Normal NECK: No JVD; No carotid bruits LYMPHATICS: No lymphadenopathy CARDIAC: RRR, no murmurs, rubs, gallops RESPIRATORY:  Clear to auscultation without rales, wheezing or rhonchi  ABDOMEN: Soft, non-tender, non-distended MUSCULOSKELETAL:  No edema; No deformity  SKIN: Warm and dry NEUROLOGIC:  Alert and oriented x 3 PSYCHIATRIC:  Normal affect            Assessment ASSESSMENT:     1. Typical atrial flutter ([place])   2. Chronic systolic congestive heart failure ([place])   3. Alcohol abuse   4. Encounter for long-term (current) use of high-risk medication     PLAN:     In order of problems listed above:   #Typical atrial flutter Maintaining normal rhythm on amiodarone.  On Eliquis for stroke prophylaxis.  Discussed rhythm control options for the patient including continued amiodarone use, alternative antiarrhythmic drug therapy versus catheter ablation.  Given his young age and history of chronic systolic heart failure, I do think a rhythm control strategy is important.  Given his young age I think avoiding amiodarone any associated off target effects is important.  I think he is a good candidate for catheter ablation.  I discussed the procedure in detail with the patient including the risks, recovery and likelihood of success.  I discussed the possibility of developing atrial fibrillation after a successful  atrial flutter ablation.  He understands all of this and wishes to proceed.   Risk, benefits, and alternatives to EP study and radiofrequency ablation for atrial flutter were also discussed in detail today. These risks include but are not limited to stroke, bleeding, vascular damage, tamponade, perforation, damage to the esophagus, lungs, and other structures, pulmonary vein stenosis, worsening renal function, and death. The patient understands these risk and wishes to proceed.  We will therefore proceed with catheter ablation at the next available time.  Carto, ICE, anesthesia are requested for the procedure.    #Chronic systolic heart failure NYHA class II.  Warm and dry on exam.  Follows with Dr. Aundra Dubin in the heart failure clinic.  On good medical therapy.  If, after maintaining normal rhythm for at least 3 months following good medical therapy, his ejection fraction remains severely reduced, will need to discuss defibrillator implant.  Plan for CTI ablation today.     Signed, Lars Mage, MD, [facility], [facility] 11/23/2021 Electrophysiology [facility]

## 2021-11-23 NOTE — Anesthesia Postprocedure Evaluation (Signed)
Anesthesia Post Note  Patient: Bryan Guerrero  Procedure(s) Performed: A-FLUTTER ABLATION     Patient location during evaluation: PACU Anesthesia Type: General Level of consciousness: sedated Pain management: pain level controlled Vital Signs Assessment: post-procedure vital signs reviewed and stable Respiratory status: spontaneous breathing and respiratory function stable Cardiovascular status: stable Postop Assessment: no apparent nausea or vomiting Anesthetic complications: no   No notable events documented.  Last Vitals:  Vitals:   11/23/21 1555 11/23/21 1600  BP: (!) 149/79 (!) 143/95  Pulse: (!) 45 (!) 48  Resp: 15 15  Temp:    SpO2: 100% 100%    Last Pain:  Vitals:   11/23/21 1532  TempSrc: Tympanic  PainSc: 0-No pain                 [name] DANIEL

## 2021-11-23 NOTE — Anesthesia Preprocedure Evaluation (Addendum)
Anesthesia Evaluation  Patient identified by MRN, date of birth, ID band Patient awake    Reviewed: Allergy & Precautions, NPO status , Patient's Chart, lab work & pertinent test results  History of Anesthesia Complications Negative for: history of anesthetic complications  Airway Mallampati: II  TM Distance: >3 FB Neck ROM: Full    Dental no notable dental hx. (+) Dental Advisory Given   Pulmonary asthma , Current Smoker, former smoker,    breath sounds clear to auscultation       Cardiovascular hypertension, Pt. on medications +CHF  + dysrhythmias Atrial Fibrillation  Rhythm:Regular Rate:Normal  Alcoholic cardiomyopathy   IMPRESSIONS    1. Left ventricular ejection fraction, by estimation, is 20 to 25%. The  left ventricle has severely decreased function. The left ventricle  demonstrates global hypokinesis. The left ventricular internal cavity size  was mildly dilated. Left ventricular  diastolic parameters are indeterminate.  2. Right ventricular systolic function is low normal. The right  ventricular size is normal. There is normal pulmonary artery systolic  pressure.  3. Left atrial size was moderately dilated.  4. The mitral valve is normal in structure. Trivial mitral valve  regurgitation. No evidence of mitral stenosis.  5. The aortic valve is tricuspid. Aortic valve regurgitation is not  visualized. No aortic stenosis is present.  6. The inferior vena cava is dilated in size with >50% respiratory  variability, suggesting right atrial pressure of 8 mmHg.   Comparison(s): No significant change from prior study.    Neuro/Psych negative neurological ROS  negative psych ROS   GI/Hepatic negative GI ROS, (+)     substance abuse  alcohol use,   Endo/Other  negative endocrine ROS  Renal/GU negative Renal ROS  negative genitourinary   Musculoskeletal negative musculoskeletal ROS (+)   Abdominal    Peds negative pediatric ROS (+)  Hematology negative hematology ROS (+)   Anesthesia Other Findings   Reproductive/Obstetrics negative OB ROS                            Anesthesia Physical  Anesthesia Plan  ASA: 3  Anesthesia Plan: General   Post-op Pain Management: Minimal or no pain anticipated   Induction: Intravenous  PONV Risk Score and Plan: 2 and Ondansetron, Midazolam and Treatment may vary due to age or medical condition  Airway Management Planned: Mask  Additional Equipment:   Intra-op Plan:   Post-operative Plan:   Informed Consent: I have reviewed the patients History and Physical, chart, labs and discussed the procedure including the risks, benefits and alternatives for the proposed anesthesia with the patient or authorized representative who has indicated his/her understanding and acceptance.     Dental advisory given  Plan Discussed with: CRNA and Anesthesiologist  Anesthesia Plan Comments:        Anesthesia Quick Evaluation

## 2021-11-23 NOTE — Anesthesia Procedure Notes (Signed)
Procedure Name: Intubation Date/Time: 11/23/2021 2:04 PM  Performed by: Anastasio Auerbach, CRNAPre-anesthesia Checklist: Patient identified, Emergency Drugs available, Suction available and Patient being monitored Patient Re-evaluated:Patient Re-evaluated prior to induction Oxygen Delivery Method: Circle system utilized Preoxygenation: Pre-oxygenation with 100% oxygen Induction Type: IV induction Ventilation: Mask ventilation without difficulty Grade View: Grade II Tube type: Oral Tube size: 8.0 mm Number of attempts: 1 Airway Equipment and Method: Stylet and Oral airway Placement Confirmation: ETT inserted through vocal cords under direct vision, positive ETCO2 and breath sounds checked- equal and bilateral Secured at: 23 cm Tube secured with: Tape Dental Injury: Teeth and Oropharynx as per pre-operative assessment

## 2021-11-24 ENCOUNTER — Encounter (HOSPITAL_COMMUNITY): Payer: Self-pay | Admitting: Cardiology

## 2021-11-29 ENCOUNTER — Telehealth: Payer: Self-pay | Admitting: Cardiology

## 2021-11-29 DIAGNOSIS — Z0279 Encounter for issue of other medical certificate: Secondary | ICD-10-CM

## 2021-11-29 NOTE — Telephone Encounter (Signed)
Pt came in needing his Short Term Disability forms completed, payment received, forms are in provider box.  Thank you

## 2021-12-01 NOTE — Telephone Encounter (Signed)
Form completed and faxed to [employer]

## 2021-12-21 ENCOUNTER — Ambulatory Visit (HOSPITAL_COMMUNITY)
Admission: RE | Admit: 2021-12-21 | Discharge: 2021-12-21 | Disposition: A | Discharge status: Home / Self Care | Payer: No Typology Code available for payment source | Source: Ambulatory Visit | Attending: Critical Care Medicine | Admitting: Critical Care Medicine

## 2021-12-21 ENCOUNTER — Ambulatory Visit: Payer: No Typology Code available for payment source | Admitting: Critical Care Medicine

## 2021-12-21 ENCOUNTER — Encounter (HOSPITAL_COMMUNITY): Payer: Self-pay | Admitting: Cardiology

## 2021-12-21 ENCOUNTER — Ambulatory Visit (HOSPITAL_BASED_OUTPATIENT_CLINIC_OR_DEPARTMENT_OTHER)
Admission: RE | Admit: 2021-12-21 | Discharge: 2021-12-21 | Disposition: A | Discharge status: Home / Self Care | Payer: No Typology Code available for payment source | Source: Ambulatory Visit | Attending: Cardiology | Admitting: Cardiology

## 2021-12-21 VITALS — BP 130/78 | HR 54 | Wt 195.8 lb

## 2021-12-21 DIAGNOSIS — F1010 Alcohol abuse, uncomplicated: Secondary | ICD-10-CM | POA: Insufficient documentation

## 2021-12-21 DIAGNOSIS — I5022 Chronic systolic (congestive) heart failure: Secondary | ICD-10-CM | POA: Diagnosis not present

## 2021-12-21 DIAGNOSIS — Z79899 Other long term (current) drug therapy: Secondary | ICD-10-CM | POA: Diagnosis not present

## 2021-12-21 DIAGNOSIS — I5023 Acute on chronic systolic (congestive) heart failure: Secondary | ICD-10-CM | POA: Diagnosis present

## 2021-12-21 DIAGNOSIS — I428 Other cardiomyopathies: Secondary | ICD-10-CM | POA: Insufficient documentation

## 2021-12-21 DIAGNOSIS — Z7901 Long term (current) use of anticoagulants: Secondary | ICD-10-CM | POA: Insufficient documentation

## 2021-12-21 DIAGNOSIS — I5082 Biventricular heart failure: Secondary | ICD-10-CM | POA: Diagnosis not present

## 2021-12-21 DIAGNOSIS — I130 Hypertensive heart and chronic kidney disease with heart failure and stage 1 through stage 4 chronic kidney disease, or unspecified chronic kidney disease: Secondary | ICD-10-CM | POA: Insufficient documentation

## 2021-12-21 DIAGNOSIS — I4892 Unspecified atrial flutter: Secondary | ICD-10-CM | POA: Insufficient documentation

## 2021-12-21 DIAGNOSIS — I340 Nonrheumatic mitral (valve) insufficiency: Secondary | ICD-10-CM | POA: Insufficient documentation

## 2021-12-21 LAB — COMPREHENSIVE METABOLIC PANEL
ALT: 10 U/L (ref 0–44)
AST: 15 U/L (ref 15–41)
Albumin: 3.9 g/dL (ref 3.5–5.0)
Alkaline Phosphatase: 64 U/L (ref 38–126)
Anion gap: 8 (ref 5–15)
BUN: 13 mg/dL (ref 6–20)
CO2: 25 mmol/L (ref 22–32)
Calcium: 9 mg/dL (ref 8.9–10.3)
Chloride: 103 mmol/L (ref 98–111)
Creatinine, Ser: 1.3 mg/dL — ABNORMAL HIGH (ref 0.61–1.24)
GFR, Estimated: >60 mL/min (ref >60)
Glucose, Bld: 107 mg/dL — ABNORMAL HIGH (ref 70–99)
Potassium: 4.1 mmol/L (ref 3.5–5.1)
Sodium: 136 mmol/L (ref 135–145)
Total Bilirubin: 0.8 mg/dL (ref 0.3–1.2)
Total Protein: 6.7 g/dL (ref 6.5–8.1)

## 2021-12-21 LAB — ECHOCARDIOGRAM COMPLETE
Area-P 1/2: 2.99 cm2
Calc EF: 55.6 %
S' Lateral: 4 cm
Single Plane A2C EF: 54.8 %
Single Plane A4C EF: 55.7 %

## 2021-12-21 LAB — TSH: TSH: 1.621 u[IU]/mL (ref 0.350–4.500)

## 2021-12-21 MED ORDER — AMIODARONE HCL 200 MG PO TABS: 200.0000 mg | ORAL_TABLET | Freq: Every day | ORAL | 3 refills | Status: DC

## 2021-12-21 NOTE — Progress Notes (Signed)
Advanced Heart Failure Clinic Note    PCP: Storm Frisk, MD HF Cardiology: Dr. Shirlee Latch  Reason for Visit: Heart Failure   HPI: [name] is a 51 y.o. with history of chronic systolic heart failure dating back to 2020, ETOH abuse, HTN, and tobacco abuse.     Admitted back in February 2020 with increased shortness of breath. At that time he was drinking 1/5 of licquor per day. ECHO completed and showed severely reduced EF 15-20%. 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 No formal cath. He was discharged 08/08/19. He returned for one appointment with cardiology but no return f/u after that.   Admitted 10/21 for acute on chronic systolic heart failure + atrial flutter w/ RVR requiring DCCV. Also w/ low output requiring milrinone. R/LHC showed mildly elevated PCWP, normal RA pressure, normal cardiac output on milrinone and no significant coronary disease. Echo showed severe LVEF <20%, severe RV dysfunction and severe MR. He was diuresed w/ IV Lasix. Was able to wean off milrinone. Discharge wt was 186 lb.   Echo in 1/22 showed EF 25% with global hypokinesis, normal RV, trivial MR.    Follow up 10/22, stable NYHA I and not volume overloaded. GDMT increased with elevated BP.   Admitted 4/23 with AF with RVR. Started on amio gtt. Underwent DCCV to NSR. Echo showed EF 20-25% prominent apical trabeculations. 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 Mild MR. RV mildly HK. Amio changed to oral. Discharged home, weight 183 lbs.   Saw EP 09/22/21, s/p atrial flutter ablation 11/2021.  Today he returns for HF follow up. Overall feeling fine. 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 No dyspnea with lifting or walking at his job. Denies palpitations, abnormal bleeding, CP, dizziness, edema, or PND/Orthopnea. Appetite ok. No fever or chills. Weight at home 195-200 pounds. Taking all medications. Has not needed lasix. Back drinking 3 beers/week.  Echo today (12/21/21) shows EF improved 50%, normal RV  ECG (personally  reviewed): sinus bradycardia at 52  Labs (12/21): TSH normal, K 4.2, creatinine 1.3 Labs (1/22): K 3.8, creatinine 1.18 Labs (4/23): K 3.5, creatinine 1.34, hgb 13.9 Labs (5/23): K 4.1, creatinine 1.30  PMH: 1. HTN 2. ETOH abuse 3. Chronic systolic CHF: Nonischemic cardiomyopathy.   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  - LHC/RHC (10/21): No significant CAD; mean RA 4, PA 34/8, mean PCWP 18, CI 3.01.  - Echo (1/22): EF 25% with global hypokinesis, normal RV, trivial MR. - Echo (4/23): EF 20-25% prominent apical trabeculations. 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 Mild MR. RV mildly HK  - Echo (7/23) EF 50%, normal RV 4. Atrial flutter: DCCV 10/21. - DCCV 4/23 to NSR (on amiodarone) - s/p atrial flutter ablation 6/23. 5. CKD stage 3  Review of systems complete and found to be negative unless listed in HPI.    Current Outpatient Medications  Medication Sig Dispense Refill   acetaminophen (TYLENOL) 500 MG tablet Take 1,000 mg by mouth every 6 (six) hours as needed for mild pain.     amiodarone (PACERONE) 200 MG tablet Take 1 tablet (200 mg total) by mouth 2 (two) times daily. 180 tablet 3   apixaban (ELIQUIS) 5 MG TABS tablet Take 1 tablet (5 mg total) by mouth 2 (two) times daily. 180 tablet 3   carvedilol (COREG) 25 MG tablet Take 1 tablet (25 mg total) by mouth  2 (two) times daily. 180 tablet 3   dapagliflozin propanediol (FARXIGA) 10 MG TABS tablet Take 1 tablet (10 mg total) by mouth daily. 90 tablet 3   furosemide (LASIX) 20 MG tablet Take 1 tablet (20 mg total) by mouth daily as needed. For > 3 lb weight gain in 24 hr or > 5 lb weight gain in 1 week 30 tablet 3   Multiple Vitamin (MULTIVITAMIN WITH MINERALS) TABS tablet Take 1 tablet by mouth daily.     sacubitril-valsartan (ENTRESTO) 97-103 MG Take 1 tablet by mouth 2 (two) times  daily. 180 tablet 3   spironolactone (ALDACTONE) 25 MG tablet Take 1 tablet by mouth once daily 90 tablet 0   No current facility-administered medications for this encounter.    No Known Allergies    Social History   Socioeconomic History   Marital status: Widowed    Spouse name: Not on file   Number of children: Not on file   Years of education: Not on file   Highest education level: Not on file  Occupational History   Not on file  Tobacco Use   Smoking status: Some Days    Types: Cigars    Last attempt to quit: 07/14/2018    Years since quitting: 3.4   Smokeless tobacco: Never  Vaping Use   Vaping Use: Never used  Substance and Sexual Activity   Alcohol use: Not Currently    Comment: quit 03/09/20   Drug use: Not Currently    Types: Marijuana    Comment: quit 03/09/20   Sexual activity: Not Currently  Other Topics Concern   Not on file  Social History Narrative   Not on file   Social Determinants of Health   Financial Resource Strain: Not on file  Food Insecurity: No Food Insecurity (03/20/2020)   Hunger Vital Sign    Worried About Running Out of Food in the Last Year: Never true    Ran Out of Food in the Last Year: Never true  Transportation Needs: No Transportation Needs (03/20/2020)   PRAPARE - Hydrologist (Medical): No    Lack of Transportation (Non-Medical): No  Physical Activity: Not on file  Stress: Not on file  Social Connections: Not on file  Intimate Partner Violence: Not on file   Family History  Problem Relation Age of Onset   Hypertension Father    Cancer Neg Hx    Diabetes Neg Hx    BP 130/78   Pulse (!) 54   Wt 88.8 kg (195 lb 12.8 oz)   SpO2 99%   BMI 26.56 kg/m   Wt Readings from Last 3 Encounters:  12/21/21 88.8 kg (195 lb 12.8 oz)  11/23/21 88.5 kg (195 lb)  10/13/21 87.1 kg (192 lb)   PHYSICAL EXAM: General:  NAD. No resp difficulty HEENT: Normal Neck: Supple. No JVD. Carotids 2+ bilat; no  bruits. No lymphadenopathy or thryomegaly appreciated. Cor: PMI nondisplaced. Brady rate & rhythm. No rubs, gallops or murmurs. Lungs: Clear Abdomen: Soft, nontender, nondistended. No hepatosplenomegaly. No bruits or masses. Good bowel sounds. Extremities: No cyanosis, clubbing, rash, edema Neuro: Alert & oriented x 3, cranial nerves grossly intact. Moves all 4 extremities w/o difficulty. Affect pleasant.  ASSESSMENT & PLAN: 1. Chronic Systolic CHF: Nonischemic cardiomyopathy.  Admission Q000111Q for acute systolic CHF w/ low output requiring milrinone. Biventricular failure, echo 10/21 EF < 20%, severe RV dysfunction, severe MR. LHC showed no significant coronary disease. CO good on milrinone,  was able to wean off. 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  Echo  1/22 with EF 25%, normal RV, trivial MR. Patient has history of heavy ETOH => he quit, but started back again. Cause of cardiomyopathy likely noncompaction versus ETOH. Echo 4/23 EF 20-25% prominent apical trabeculations. 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 Mild MR. RV mildly HK. Echo today 12/21/21 showed EF improved 50%, normal RV.  He is not volume overloaded on exam. Stable NYHA I symptoms. - Continue Coreg 25 mg bid.   - Continue spironolactone 25 mg daily.   - Continue Entresto 97/103 mg bid. - Continue Farxiga 10 mg daily.  - He does not need daily loops. - Discussed ETOH cessation.  2. Atrial flutter: s/p DCCV 10/21.  Readmitted with AF RVR. Started on amiodarone and underwent DCCV (4/23) to NSR. S/p AFL ablation 6/23. SB on ECG today. - Decrease amiodarone to 200 mg daily. Check TSH and CMET today. - Continue Eliquis 5 mg bid. Recent CBC stable. 3. ETOH abuse: Advised cessation. 4. Mitral regurgitation: Severe on 10/21 echo, echo in 1/22 showed only trivial MR.  - None on echo today.  Follow up in 6 months with APP.  Bryan Guerrero, Bryan Guerrero 12/21/21  Patient seen with NP, agree with the above note.    Patient has been doing well, no significant dyspnea and no palpitations.  He had atrial flutter ablation in 6/23.  Echo done today shows improvement in EF to 45-50%.  He is still drinking but minimally, about 3 beers/week.  He is in NSR.   General: NAD Neck: No JVD, no thyromegaly or thyroid nodule.  Lungs: Clear to auscultation bilaterally with normal respiratory effort. CV: Nondisplaced PMI.  Heart regular S1/S2, no S3/S4, no murmur.  No peripheral edema.  No carotid bruit.  Normal pedal pulses.  Abdomen: Soft, nontender, no hepatosplenomegaly, no distention.  Skin: Intact without lesions or rashes.  Neurologic: Alert and oriented x 3.  Psych: Normal affect. Extremities: No clubbing or cyanosis.  HEENT: Normal.   Decrease amiodarone today to 200 mg daily.  He should be able to stop this eventually with AFL ablation, has followup with Dr. Lalla Brothers.  Check LFTs, TSH today and will need regular eye exam.   With improvement in EF to 45-50% range, continue current cardiac meds.  He is out of range for ICD.  He is on goal med doses.  BMET today.   Marca Ancona 12/21/2021

## 2021-12-21 NOTE — Progress Notes (Signed)
Medication Samples have been provided to the patient.  Drug name: Eliquis       Strength: 5 mg        Qty: 4  LOT: [id number]  Exp.Date: 09/2023  Dosing instructions: Take 1 tablet Twice daily   The patient has been instructed regarding the correct time, dose, and frequency of taking this medication, including desired effects and most common side effects.   Smitty Cords Myya Meenach 12:42 PM 12/21/2021

## 2021-12-21 NOTE — Patient Instructions (Signed)
EKG done today.   Labs done today. We will contact you only if your labs are abnormal.  DECREASE Amiodarone to 200mg  (1 tablet) by mouth daily.   No other medication changes were made. Please continue all current medications as prescribed.  Your physician recommends that you schedule a follow-up appointment in: 6 months with our NP/[facility] Clinic here in our office. Please contact our office in December to schedule a January 2024 appointment.   If you have any questions or concerns before your next appointment please send February 2024 a message through [place] or call our office at [phone].    TO LEAVE A MESSAGE FOR THE NURSE SELECT OPTION 2, PLEASE LEAVE A MESSAGE INCLUDING: YOUR NAME DATE OF BIRTH CALL BACK NUMBER REASON FOR CALL**this is important as we prioritize the call backs  YOU WILL RECEIVE A CALL BACK THE SAME DAY AS LONG AS YOU CALL BEFORE 4:00 PM   Do the following things EVERYDAY: Weigh yourself in the morning before breakfast. Write it down and keep it in a log. Take your medicines as prescribed Eat low salt foods--Limit salt (sodium) to 2000 mg per day.  Stay as active as you can everyday Limit all fluids for the day to less than 2 liters   At the [facility], you and your health needs are our priority. As part of our continuing mission to provide you with exceptional heart care, we have created designated Provider Care Teams. These Care Teams include your primary Cardiologist (physician) and Advanced Practice Providers (APPs- Physician Assistants and Nurse Practitioners) who all work together to provide you with the care you need, when you need it.   You may see any of the following providers on your designated Care Team at your next follow up: Dr [phone] Dr Arvilla Meres, NP Carron Curie, Robbie Lis [place], PharmD   Please be sure to bring in all your medications bottles to every appointment.

## 2021-12-22 ENCOUNTER — Ambulatory Visit: Payer: No Typology Code available for payment source | Admitting: Critical Care Medicine

## 2021-12-22 NOTE — Progress Notes (Deleted)
Established Patient Office Visit: Transition to new PCP  Subjective    Patient ID: Bryan Guerrero, male    DOB: 10/21/1970  Age: 51 y.o. MRN: [id number]  CC: No chief complaint on file.   HPI Bryan Guerrero presents to re-establish primary care. Former [facility] patient  Tdap Reggie Pile  Sherre Lain 10/2021: Tshombe Doolin is a 51 y.o. male here today for a follow up visit and to establish care After hospitalization 4/5-4/7.  Seen by cardiology and CHF clinic 4/12 and 10/01/2021 respectively.  He has not been using substances or drinking alcohol.  Weights stable.  No SOB.  No CP.  Meds are expensive. He wants to see if any are cheaper here.  No new issues or concerns.  He does need RF on farxiga.     From discharge summary: Primary Discharge Diagnoses:  Atrial Flutter w/ RVR, s/p DCCV A/c Biventricular Heart Failure/ NICM  ETOH Abuse  Hypokalemia     Hospital Course:   Mr Billy is a 51 y.o. with history of chronic systolic heart failure dating back to 2020, ETOH abuse, HTN, and tobacco abuse.     Admitted back in February 2020 with increased shortness of breath. At that time he was drinking 1/5 of licquor per day. ECHO completed and showed severely reduced EF 15-20%. 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 No formal cath. He was discharged 08/08/19. He returned for one appointment with cardiology but no return f/u after that.    Admitted 10/21 for acute on chronic systolic heart failure + atrial flutter w/ RVR requiring DCCV. Also w/ low output requiring milrinone. R/LHC showed mildly elevated PCWP, normal RA pressure, normal cardiac output on milrinone and no significant coronary disease. Echo showed severe LVEF <20%, severe RV dysfunction and severe MR. He was diuresed w/ IV Lasix. Was able to wean off milrinone. Discharge wt was 186 lb.    Echo in 1/22 showed EF 25% with global hypokinesis, normal RV, trivial MR.   Presented to the ED on 4/6 w/ two days of worsening shortness of  breath with exertion, lethargy, and dizziness. Found to be in rapid atrial flutter and a/c CHF w/ mild volume overload. Serum etOH was positive. He admitted to resuming etOH use but less than before; a couple beers a day. He reported being fully compliance w/ Eliquis w/o any missed doses in the last 30 days.    He was admitted and placed on amio gtt. Continued on Eliquis. IV Lasix given for CHF w/ good diuresis.   Echo showed EF 20-25%, RV low normal. Only mild MR    He underwent successful DCCV back to NSR. He was monitored an additional night and remained in NSR. IV amio converted to PO amio. Case was d/w EP and he will be seen in outpatient EP clinic in 1 week to discuss AFL ablation. Cessation from ETOH recommended.     On 4/8, he was last seen and examined by Dr. Gala Romney and felt stable for d/c home. Consultation arranged w/ Dr. Lalla Brothers on 4/12. Has f/u in the [facility] on 4/21.    Also prior home meds/ GDMT resumed. PRN lasix ordered at d/c for wt gain.    From cardiology plan 09/22/2021:   #Typical atrial flutter Maintaining normal rhythm on amiodarone.  On Eliquis for stroke prophylaxis.  Discussed rhythm control options for the patient including continued amiodarone use, alternative antiarrhythmic drug therapy versus catheter ablation.  Given his young age and history of chronic systolic heart failure, I  do think a rhythm control strategy is important.  Given his young age I think avoiding amiodarone any associated off target effects is important.  I think he is a good candidate for catheter ablation.  I discussed the procedure in detail with the patient including the risks, recovery and likelihood of success.  I discussed the possibility of developing atrial fibrillation after a successful atrial flutter ablation.  He understands all of this and wishes to proceed.   Risk, benefits, and alternatives to EP study and radiofrequency ablation for atrial flutter were also discussed in detail  today. These risks include but are not limited to stroke, bleeding, vascular damage, tamponade, perforation, damage to the esophagus, lungs, and other structures, pulmonary vein stenosis, worsening renal function, and death. The patient understands these risk and wishes to proceed.  We will therefore proceed with catheter ablation at the next available time.  Carto, ICE, anesthesia are requested for the procedure.    #Chronic systolic heart failure NYHA class II.  Warm and dry on exam.  Follows with Dr. Shirlee Latch in the heart failure clinic.  On good medical therapy.  If, after maintaining normal rhythm for at least 3 months following good medical therapy, his ejection fraction remains severely reduced, will need to discuss defibrillator implant.  #Alcohol abuse Abstinence encouraged     From CHF clinic A/P: ASSESSMENT & PLAN: 1. Chronic Systolic CHF: Nonischemic cardiomyopathy.  Admission 10/21 for acute systolic CHF w/ low output requiring milrinone. Biventricular failure, echo 10/21 EF < 20%, severe RV dysfunction, severe MR. LHC showed no significant coronary disease. CO good on milrinone, was able to wean off. 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  Echo  1/22 with EF 25%, normal RV, trivial MR. Patient has history of heavy ETOH => he quit, but started back again. Cause of cardiomyopathy likely noncompaction versus ETOH. Echo 4/23 EF 20-25% prominent apical trabeculations. 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 Mild MR. RV mildly HK.  He does not look volume overloaded on exam. Stable NYHA I symptoms. - Continue Coreg 25 mg bid.   - Continue spironolactone 25 mg daily.   - Continue Entresto 97/103 mg bid - Continue Farxiga 10 mg daily  - He does not need daily loops. - Needs to completely stop ETOH again.  - Will repeat echo in 3 months now that he is off ETOH and in NSR. If EF remains < 35%, will refer for ICD, narrow QRS so no CRT-D. 2. Atrial flutter: s/p DCCV 10/21.   Readmitted with AF RVR. Started on amiodarone and underwent DCCV (4/23) to NSR. SB on ECG today. - Planning on AF ablation with Dr. Lalla Brothers 6/23. - Decrease amiodarone to 200 mg bid. (Has been taking 400 bid since discharge) - Continue Eliquis 5 mg bid. CBC today. 3. ETOH abuse: No longer drinking. He understands he needs to completely abstain. 4. Mitral regurgitation: Severe on 10/21 echo, echo in 1/22 showed only trivial MR.  - Mild on most recent echo.    RTW: He works as a Production designer, theatre/television/film at [employer]. I think he can return to his work duties if he feels up to it. He was given a note for his employer, will need allowances for rest breaks and/or days off as needed.   1. Encounter to establish care Will need to be reassigned bc has not been here in a long time.     2. Atrial flutter, unspecified type (HCC) Continue amiodarone.  Labs being followed by cardiology currently.  He is compliant  on all his meds   3. Acute combined systolic and diastolic heart failure (HCC) Continue lasix, spirinolactone, daily weights, followed by cardiology - dapagliflozin propanediol (FARXIGA) 10 MG TABS tablet; Take 1 tablet (10 mg total) by mouth daily.  Dispense: 90 tablet; Refill: 3   4. Hospital discharge follow-up   5. Substance abuse (HCC) [employer] [place].org is the website for narcotics anonymous [employer].cy (website) or (870) 089-4331 is the information for alcoholics anonymous Both are free and immediately available for help with alcohol and drug use I have counseled the patient at length about substance abuse and addiction.  12 step meetings/recovery recommended.  Local 12 step meeting lists were given and attendance was encouraged.  Patient expresses understanding.      Former [facility] pcp pt  [facility] [place] 7/11: Mr Aultman is a 52 y.o. with history of chronic systolic heart failure dating back to 2020, ETOH abuse, HTN, and tobacco abuse.     Admitted back in February 2020 with increased shortness of  breath. At that time he was drinking 1/5 of licquor per day. ECHO completed and showed severely reduced EF 15-20%. 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 No formal cath. He was discharged 08/08/19. He returned for one appointment with cardiology but no return f/u after that.    Admitted 10/21 for acute on chronic systolic heart failure + atrial flutter w/ RVR requiring DCCV. Also w/ low output requiring milrinone. R/LHC showed mildly elevated PCWP, normal RA pressure, normal cardiac output on milrinone and no significant coronary disease. Echo showed severe LVEF <20%, severe RV dysfunction and severe MR. He was diuresed w/ IV Lasix. Was able to wean off milrinone. Discharge wt was 186 lb.    Echo in 1/22 showed EF 25% with global hypokinesis, normal RV, trivial MR.    Follow up 10/22, stable NYHA I and not volume overloaded. GDMT increased with elevated BP.    Admitted 4/23 with AF with RVR. Started on amio gtt. Underwent DCCV to NSR. Echo showed EF 20-25% prominent apical trabeculations. 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 Mild MR. RV mildly HK. Amio changed to oral. Discharged home, weight 183 lbs.    Saw EP 09/22/21, s/p atrial flutter ablation 11/2021.   Today he returns for HF follow up. Overall feeling fine. 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 No dyspnea with lifting or walking at his job. Denies palpitations, abnormal bleeding, CP, dizziness, edema, or PND/Orthopnea. Appetite ok. No fever or chills. Weight at home 195-200 pounds. Taking all medications. Has not needed lasix. Back drinking 3 beers/week.   Echo today (12/21/21) shows EF improved 50%, normal RV   ECG (personally reviewed): sinus bradycardia at 52   Labs (12/21): TSH normal, K 4.2, creatinine 1.3 Labs (1/22): K 3.8, creatinine 1.18 Labs (4/23): K 3.5, creatinine 1.34, hgb 13.9 Labs (5/23): K 4.1, creatinine 1.30   PMH: 1. HTN 2. ETOH abuse 3. Chronic systolic CHF: Nonischemic cardiomyopathy.   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  - LHC/RHC (10/21): No significant CAD; mean RA 4, PA 34/8, mean PCWP 18, CI 3.01.  - Echo (1/22): EF 25% with global hypokinesis, normal RV, trivial MR. - Echo (4/23): EF 20-25% prominent apical trabeculations. 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 Mild MR. RV mildly HK  - Echo (7/23) EF 50%, normal RV 4. Atrial flutter: DCCV 10/21. - DCCV 4/23 to NSR (on amiodarone) -  s/p atrial flutter ablation 6/23. 5. CKD stage 3   ASSESSMENT & PLAN: 1. Chronic Systolic CHF: Nonischemic cardiomyopathy.  Admission [id number] for acute systolic CHF w/ low output requiring milrinone. Biventricular failure, echo 10/21 EF < 20%, severe RV dysfunction, severe MR. LHC showed no significant coronary disease. CO good on milrinone, was able to wean off. 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  Echo  1/22 with EF 25%, normal RV, trivial MR. Patient has history of heavy ETOH => he quit, but started back again. Cause of cardiomyopathy likely noncompaction versus ETOH. Echo 4/23 EF 20-25% prominent apical trabeculations. 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 Mild MR. RV mildly HK. Echo today 12/21/21 showed EF improved 50%, normal RV.  He is not volume overloaded on exam. Stable NYHA I symptoms. - Continue Coreg 25 mg bid.   - Continue spironolactone 25 mg daily.   - Continue Entresto 97/103 mg bid. - Continue Farxiga 10 mg daily.  - He does not need daily loops. - Discussed ETOH cessation.  2. Atrial flutter: s/p DCCV 10/21.  Readmitted with AF RVR. Started on amiodarone and underwent DCCV (4/23) to NSR. S/p AFL ablation 6/23. SB on ECG today. - Decrease amiodarone to 200 mg daily. Check TSH and CMET today. - Continue Eliquis 5 mg bid. Recent CBC stable. 3. ETOH abuse: Advised cessation. 4. Mitral regurgitation: Severe on 10/21 echo, echo in 1/22 showed only  trivial MR.  - None on echo today.   Follow up in 6 months with APP.   Allena Katz, FNP-BC 12/21/21   Patient seen with NP, agree with the above note.    Patient has been doing well, no significant dyspnea and no palpitations.  He had atrial flutter ablation in 6/23.  Echo done today shows improvement in EF to 45-50%.  He is still drinking but minimally, about 3 beers/week.  He is in NSR.     Outpatient Encounter Medications as of 12/22/2021  Medication Sig   acetaminophen (TYLENOL) 500 MG tablet Take 1,000 mg by mouth every 6 (six) hours as needed for mild pain.   amiodarone (PACERONE) 200 MG tablet Take 1 tablet (200 mg total) by mouth daily.   apixaban (ELIQUIS) 5 MG TABS tablet Take 1 tablet (5 mg total) by mouth 2 (two) times daily.   carvedilol (COREG) 25 MG tablet Take 1 tablet (25 mg total) by mouth 2 (two) times daily.   dapagliflozin propanediol (FARXIGA) 10 MG TABS tablet Take 1 tablet (10 mg total) by mouth daily.   furosemide (LASIX) 20 MG tablet Take 1 tablet (20 mg total) by mouth daily as needed. For > 3 lb weight gain in 24 hr or > 5 lb weight gain in 1 week   Multiple Vitamin (MULTIVITAMIN WITH MINERALS) TABS tablet Take 1 tablet by mouth daily.   sacubitril-valsartan (ENTRESTO) 97-103 MG Take 1 tablet by mouth 2 (two) times daily.   spironolactone (ALDACTONE) 25 MG tablet Take 1 tablet by mouth once daily   No facility-administered encounter medications on file as of 12/22/2021.    Past Medical History:  Diagnosis Date   Alcohol abuse    Asthma    Cardiomyopathy ([place])    Dyspnea    Hypertension     Past Surgical History:  Procedure Laterality Date   A-FLUTTER ABLATION N/A 11/23/2021   Procedure: A-FLUTTER ABLATION;  Surgeon: Vickie Epley, MD;  Location: [facility];  Service: Cardiovascular;  Laterality: N/A;   CARDIOVERSION N/A 03/17/2020   Procedure: CARDIOVERSION;  Surgeon: Lelon Perla, MD;  Location: [facility] ENDOSCOPY;  Service:  Cardiovascular;  Laterality: N/A;   CARDIOVERSION N/A 09/16/2021   Procedure: CARDIOVERSION;  Surgeon: Larey Dresser, MD;  Location: [facility] ENDOSCOPY;  Service: Cardiovascular;  Laterality: N/A;   RIGHT/LEFT HEART CATH AND CORONARY ANGIOGRAPHY N/A 03/19/2020   Procedure: RIGHT/LEFT HEART CATH AND CORONARY ANGIOGRAPHY;  Surgeon: Larey Dresser, MD;  Location: [place] [facility];  Service: Cardiovascular;  Laterality: N/A;   TEE WITHOUT CARDIOVERSION  10/052021   TEE WITHOUT CARDIOVERSION N/A 03/17/2020   Procedure: TRANSESOPHAGEAL ECHOCARDIOGRAM (TEE);  Surgeon: Lelon Perla, MD;  Location: [facility] ENDOSCOPY;  Service: Cardiovascular;  Laterality: N/A;    Family History  Problem Relation Age of Onset   Hypertension Father    Cancer Neg Hx    Diabetes Neg Hx     Social History   Socioeconomic History   Marital status: Widowed    Spouse name: Not on file   Number of children: Not on file   Years of education: Not on file   Highest education level: Not on file  Occupational History   Not on file  Tobacco Use   Smoking status: Some Days    Types: Cigars    Last attempt to quit: 07/14/2018    Years since quitting: 3.4   Smokeless tobacco: Never  Vaping Use   Vaping Use: Never used  Substance and Sexual Activity   Alcohol use: Not Currently    Comment: quit 03/09/20   Drug use: Not Currently    Types: Marijuana    Comment: quit 03/09/20   Sexual activity: Not Currently  Other Topics Concern   Not on file  Social History Narrative   Not on file   Social Determinants of Health   Financial Resource Strain: Not on file  Food Insecurity: No Food Insecurity (03/20/2020)   Hunger Vital Sign    Worried About Running Out of Food in the Last Year: Never true    Ran Out of Food in the Last Year: Never true  Transportation Needs: No Transportation Needs (03/20/2020)   PRAPARE - Hydrologist (Medical): No    Lack of Transportation (Non-Medical): No  Physical  Activity: Not on file  Stress: Not on file  Social Connections: Not on file  Intimate Partner Violence: Not on file    ROS      Objective    There were no vitals taken for this visit.  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  - LHC/RHC (10/21): No significant CAD; mean RA 4, PA 34/8, mean PCWP 18, CI 3.01.  - Echo (1/22): EF 25% with global hypokinesis, normal RV, trivial MR. - Echo (4/23): EF 20-25% prominent apical trabeculations. 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 Mild MR. RV mildly HK  - Echo (7/23) EF 50%, normal RV 4. Atrial flutter: DCCV 10/21. - DCCV 4/23 to NSR (on amiodarone) - s/p atrial flutter ablation 6/23. Assessment & Plan:   Problem List Items Addressed This Visit   None   No follow-ups on file.   Asencion Noble, MD

## 2021-12-27 ENCOUNTER — Encounter: Payer: Self-pay | Admitting: Cardiology

## 2021-12-27 ENCOUNTER — Ambulatory Visit (INDEPENDENT_AMBULATORY_CARE_PROVIDER_SITE_OTHER): Payer: No Typology Code available for payment source | Admitting: Cardiology

## 2021-12-27 VITALS — BP 122/78 | HR 46 | Ht 72.0 in | Wt 194.0 lb

## 2021-12-27 DIAGNOSIS — I5022 Chronic systolic (congestive) heart failure: Secondary | ICD-10-CM | POA: Diagnosis not present

## 2021-12-27 DIAGNOSIS — I483 Typical atrial flutter: Secondary | ICD-10-CM | POA: Diagnosis not present

## 2021-12-27 DIAGNOSIS — F1010 Alcohol abuse, uncomplicated: Secondary | ICD-10-CM

## 2021-12-27 NOTE — Patient Instructions (Addendum)
Medication Instructions:  Stop Amiodarone Your physician recommends that you continue on your current medications as directed. Please refer to the Current Medication list given to you today. *If you need a refill on your cardiac medications before your next appointment, please call your pharmacy*  Lab Work: None. If you have labs (blood work) drawn today and your tests are completely normal, you will receive your results only by: MyChart Message (if you have MyChart) OR A paper copy in the mail If you have any lab test that is abnormal or we need to change your treatment, we will call you to review the results.  Testing/Procedures: None.  Follow-Up: At [facility], you and your health needs are our priority.  As part of our continuing mission to provide you with exceptional heart care, we have created designated Provider Care Teams.  These Care Teams include your primary Cardiologist (physician) and Advanced Practice Providers (APPs -  Physician Assistants and Nurse Practitioners) who all work together to provide you with the care you need, when you need it.  Your physician wants you to follow-up in: 6-8 weeks with Steffanie Dunn, MD for a SJ loop recorder implant in office.     We recommend signing up for the patient portal called "MyChart".  Sign up information is provided on this After Visit Summary.  MyChart is used to connect with patients for Virtual Visits (Telemedicine).  Patients are able to view lab/test results, encounter notes, upcoming appointments, etc.  Non-urgent messages can be sent to your provider as well.   To learn more about what you can do with MyChart, go to [email or web address].    Any Other Special Instructions Will Be Listed Below (If Applicable).  Implantable Loop Recorder Placement  An implantable loop recorder is a small electronic device that is placed under the skin of your chest. The device records the electrical activity of your heart over a long  period of time. Your health care provider can download these recordings to monitor your heart. You may need an implantable loop recorder if you have periods of abnormal heart activity (arrhythmias) or unexplained fainting (syncope). The recorder can be left in place for 1 year or longer. Tell a health care provider about: Any allergies you have. All medicines you are taking, including vitamins, herbs, eye drops, creams, and over-the-counter medicines. Any problems you or family members have had with anesthetic medicines. Any bleeding problems you have. Any surgeries you have had. Any medical conditions you have. Whether you are pregnant or may be pregnant. What are the risks? Generally, this is a safe procedure. However, problems may occur, including: Infection. Bleeding. Allergic reactions to anesthetic medicines. Damage to nerves or blood vessels. Failure of the device to work. This could require another surgery to replace it. What happens before the procedure?  You may have a physical exam, blood tests, and imaging tests of your heart, such as a chest X-ray. Follow instructions from your health care provider about eating or drinking restrictions. Ask your health care provider about: Changing or stopping your regular medicines. This is especially important if you are taking diabetes medicines or blood thinners. Taking medicines such as aspirin and ibuprofen. These medicines can thin your blood. Do not take these medicines unless your health care provider tells you to take them. Taking over-the-counter medicines, vitamins, herbs, and supplements. Ask your health care provider how your surgical site will be marked or identified. Ask your health care provider what steps will be taken to help prevent  infection. These may include: Removing hair at the surgery site. Washing skin with a germ-killing soap. Plan to have someone take you home from the hospital or clinic. Plan to have a  responsible adult care for you for at least 24 hours after you leave the hospital or clinic. This is important. Do not use any products that contain nicotine or tobacco, such as cigarettes and e-cigarettes. If you need help quitting, ask your health care provider. What happens during the procedure? An IV will be inserted into one of your veins. You may be given one or more of the following: A medicine to help you relax (sedative). A medicine to numb the area (local anesthetic). A small incision will be made on the left side of your upper chest. A pocket will be created under your skin. The device will be placed in the pocket. The incision will be closed with stitches (sutures) or adhesive strips. A bandage (dressing) will be placed over the incision. The procedure may vary among health care providers and hospitals. What happens after the procedure? Your blood pressure, heart rate, breathing rate, and blood oxygen level will be monitored until you leave the hospital or clinic. You may be able to go home on the day of your surgery. Before you go home: Your health care provider will program your recorder. You will learn how to trigger your device with a handheld activator. You will learn how to send recordings to your health care provider. You will get an ID card for your device, and you will be told when to use it. Do not drive for 24 hours if you were given a sedative during your procedure. Summary An implantable loop recorder is a small electronic device that is placed under the skin of your chest to monitor your heart over a long period of time. The recorder can be left in place for 1 year or longer. Plan to have someone take you home from the hospital or clinic. This information is not intended to replace advice given to you by your health care provider. Make sure you discuss any questions you have with your health care provider. Document Revised: 09/29/2020 Document Reviewed:  09/29/2020 Elsevier Patient Education  2023 [employer].

## 2021-12-27 NOTE — Progress Notes (Signed)
Electrophysiology Office Follow up Visit Note:    Date:  12/27/2021   ID:  Bryan Guerrero, DOB Aug 28, 1970, MRN [id number]  PCP:  Storm Frisk, MD  [facility] Cardiologist:  Thurmon Fair, MD  [facility] Electrophysiologist:  Lanier Prude, MD    Interval History:    Bryan Guerrero is a 51 y.o. male who presents for a follow up visit.  He underwent a successful atrial flutter ablation November 23, 2021.  After the ablation he is maintained normal rhythm.  Repeat echocardiogram in sinus rhythm on July 11 shows normalization of the left ventricular function.  He is still taking amiodarone and Eliquis.      Past Medical History:  Diagnosis Date   Alcohol abuse    Asthma    Cardiomyopathy (HCC)    Dyspnea    Hypertension     Past Surgical History:  Procedure Laterality Date   A-FLUTTER ABLATION N/A 11/23/2021   Procedure: A-FLUTTER ABLATION;  Surgeon: Lanier Prude, MD;  Location: [facility] INVASIVE CV LAB;  Service: Cardiovascular;  Laterality: N/A;   CARDIOVERSION N/A 03/17/2020   Procedure: CARDIOVERSION;  Surgeon: Lewayne Bunting, MD;  Location: [facility] ENDOSCOPY;  Service: Cardiovascular;  Laterality: N/A;   CARDIOVERSION N/A 09/16/2021   Procedure: CARDIOVERSION;  Surgeon: Laurey Morale, MD;  Location: [facility] ENDOSCOPY;  Service: Cardiovascular;  Laterality: N/A;   RIGHT/LEFT HEART CATH AND CORONARY ANGIOGRAPHY N/A 03/19/2020   Procedure: RIGHT/LEFT HEART CATH AND CORONARY ANGIOGRAPHY;  Surgeon: Laurey Morale, MD;  Location: [facility] INVASIVE CV LAB;  Service: Cardiovascular;  Laterality: N/A;   TEE WITHOUT CARDIOVERSION  10/052021   TEE WITHOUT CARDIOVERSION N/A 03/17/2020   Procedure: TRANSESOPHAGEAL ECHOCARDIOGRAM (TEE);  Surgeon: Lewayne Bunting, MD;  Location: [facility] ENDOSCOPY;  Service: Cardiovascular;  Laterality: N/A;    Current Medications: Current Meds  Medication Sig   acetaminophen (TYLENOL) 500 MG tablet Take 1,000 mg by mouth every 6 (six) hours as needed  for mild pain.   apixaban (ELIQUIS) 5 MG TABS tablet Take 1 tablet (5 mg total) by mouth 2 (two) times daily.   carvedilol (COREG) 25 MG tablet Take 1 tablet (25 mg total) by mouth 2 (two) times daily.   dapagliflozin propanediol (FARXIGA) 10 MG TABS tablet Take 1 tablet (10 mg total) by mouth daily.   furosemide (LASIX) 20 MG tablet Take 1 tablet (20 mg total) by mouth daily as needed. For > 3 lb weight gain in 24 hr or > 5 lb weight gain in 1 week   Multiple Vitamin (MULTIVITAMIN WITH MINERALS) TABS tablet Take 1 tablet by mouth daily.   sacubitril-valsartan (ENTRESTO) 97-103 MG Take 1 tablet by mouth 2 (two) times daily.   spironolactone (ALDACTONE) 25 MG tablet Take 1 tablet by mouth once daily   [DISCONTINUED] amiodarone (PACERONE) 200 MG tablet Take 1 tablet (200 mg total) by mouth daily.     Allergies:   Patient has no known allergies.   Social History   Socioeconomic History   Marital status: Widowed    Spouse name: Not on file   Number of children: Not on file   Years of education: Not on file   Highest education level: Not on file  Occupational History   Not on file  Tobacco Use   Smoking status: Some Days    Types: Cigars    Last attempt to quit: 07/14/2018    Years since quitting: 3.4   Smokeless tobacco: Never  Vaping Use   Vaping  Use: Never used  Substance and Sexual Activity   Alcohol use: Not Currently    Comment: quit 03/09/20   Drug use: Not Currently    Types: Marijuana    Comment: quit 03/09/20   Sexual activity: Not Currently  Other Topics Concern   Not on file  Social History Narrative   Not on file   Social Determinants of Health   Financial Resource Strain: Not on file  Food Insecurity: No Food Insecurity (03/20/2020)   Hunger Vital Sign    Worried About Running Out of Food in the Last Year: Never true    Ran Out of Food in the Last Year: Never true  Transportation Needs: No Transportation Needs (03/20/2020)   PRAPARE - Scientist, research (physical sciences) (Medical): No    Lack of Transportation (Non-Medical): No  Physical Activity: Not on file  Stress: Not on file  Social Connections: Not on file     Family History: The patient's family history includes Hypertension in his father. There is no history of Cancer or Diabetes.  ROS:   Please see the history of present illness.    All other systems reviewed and are negative.  EKGs/Labs/Other Studies Reviewed:    The following studies were reviewed today:  December 21, 2021 echo personally reviewed Left ventricular function is mildly decreased, 45 to 50% RV function is normal Moderately dilated left atrium No MR   EKG:  The ekg ordered today demonstrates sinus rhythm  Recent Labs: 09/15/2021: B Natriuretic Peptide 66.2; Magnesium 1.8 11/03/2021: Hemoglobin 12.4; Platelets 230 12/21/2021: ALT 10; BUN 13; Creatinine, Ser 1.30; Potassium 4.1; Sodium 136; TSH 1.621  Recent Lipid Panel No results found for: "CHOL", "TRIG", "HDL", "CHOLHDL", "VLDL", "LDLCALC", "LDLDIRECT"  Physical Exam:    VS:  BP 122/78   Pulse (!) 46   Ht 6' (1.829 m)   Wt 194 lb (88 kg)   SpO2 97%   BMI 26.31 kg/m     Wt Readings from Last 3 Encounters:  12/27/21 194 lb (88 kg)  12/21/21 195 lb 12.8 oz (88.8 kg)  11/23/21 195 lb (88.5 kg)     GEN:  Well nourished, well developed in no acute distress HEENT: Normal NECK: No JVD; No carotid bruits LYMPHATICS: No lymphadenopathy CARDIAC: RRR, no murmurs, rubs, gallops RESPIRATORY:  Clear to auscultation without rales, wheezing or rhonchi  ABDOMEN: Soft, non-tender, non-distended MUSCULOSKELETAL:  No edema; No deformity  SKIN: Warm and dry NEUROLOGIC:  Alert and oriented x 3 PSYCHIATRIC:  Normal affect        ASSESSMENT:    1. Chronic systolic congestive heart failure (HCC)   2. Typical atrial flutter (HCC)   3. Alcohol abuse    PLAN:    In order of problems listed above:   #Chronic systolic heart failure NYHA class II today.   Last EF 45 to 50% in July 2023.  Warm and dry on exam.  On good medical therapy with spironolactone, Entresto, Lasix, Farxiga, Coreg.  Rhythm control is indicated.  #Typical atrial flutter Maintaining sinus rhythm after his flutter ablation.  Still on amiodarone and Eliquis.  He can stop the amiodarone today to avoid the long-term exposure to the drug and its associated off target effects.  I would like him to continue the Eliquis until we have a more established atrial fibrillation surveillance strategy in place.  I did discuss the possibility of atrial fibrillation after successful flutter ablations again during today's appointment.  We discussed given his  history of tachycardia mediated cardiomyopathy, surveillance for atrial arrhythmias is extremely important.  I discussed using Apple watch/Galaxy watch.  Cardia monitor and loop recorder.  He would like to proceed with loop recorder monitoring.  I did discuss the monthly cost associated with this form of monitoring.  We will get this scheduled for him.  Plan for Abbott loop recorder in 6 to 8 weeks.      Medication Adjustments/Labs and Tests Ordered: Current medicines are reviewed at length with the patient today.  Concerns regarding medicines are outlined above.  No orders of the defined types were placed in this encounter.  No orders of the defined types were placed in this encounter.    Signed, Steffanie Dunn, MD, [facility], [facility] 12/27/2021 9:44 PM    Electrophysiology [facility]

## 2022-02-14 NOTE — Progress Notes (Deleted)
Electrophysiology Office Follow up Visit Note:    Date:  02/14/2022   ID:  Bryan Guerrero, DOB [birth year], MRN [id number]  PCP:  Storm Frisk, MD  [facility] Cardiologist:  Thurmon Fair, MD  [facility] Electrophysiologist:  Lanier Prude, MD    Interval History:    Bryan Guerrero is a 51 y.o. male who presents for a follow up visit. They were last seen in clinic 12/27/2021.  From 12/27/2021 note. "He underwent a successful atrial flutter ablation November 23, 2021.  After the ablation he is maintained normal rhythm.  Repeat echocardiogram in sinus rhythm on July 11 shows normalization of the left ventricular function.  He is still taking amiodarone and Eliquis."  At the last appointment we planned to stop amiodarone. He wanted to stop eliquis and we discussed using a loop recorder for ongoing rhythm surveillance.      Past Medical History:  Diagnosis Date   Alcohol abuse    Asthma    Cardiomyopathy (HCC)    Dyspnea    Hypertension     Past Surgical History:  Procedure Laterality Date   A-FLUTTER ABLATION N/A 11/23/2021   Procedure: A-FLUTTER ABLATION;  Surgeon: Lanier Prude, MD;  Location: [facility] INVASIVE CV LAB;  Service: Cardiovascular;  Laterality: N/A;   CARDIOVERSION N/A 03/17/2020   Procedure: CARDIOVERSION;  Surgeon: Lewayne Bunting, MD;  Location: [facility] ENDOSCOPY;  Service: Cardiovascular;  Laterality: N/A;   CARDIOVERSION N/A 09/16/2021   Procedure: CARDIOVERSION;  Surgeon: Laurey Morale, MD;  Location: [facility] ENDOSCOPY;  Service: Cardiovascular;  Laterality: N/A;   RIGHT/LEFT HEART CATH AND CORONARY ANGIOGRAPHY N/A 03/19/2020   Procedure: RIGHT/LEFT HEART CATH AND CORONARY ANGIOGRAPHY;  Surgeon: Laurey Morale, MD;  Location: [facility] INVASIVE CV LAB;  Service: Cardiovascular;  Laterality: N/A;   TEE WITHOUT CARDIOVERSION  10/052021   TEE WITHOUT CARDIOVERSION N/A 03/17/2020   Procedure: TRANSESOPHAGEAL ECHOCARDIOGRAM (TEE);  Surgeon: Lewayne Bunting, MD;   Location: [facility] ENDOSCOPY;  Service: Cardiovascular;  Laterality: N/A;    Current Medications: No outpatient medications have been marked as taking for the 02/15/22 encounter (Appointment) with Lanier Prude, MD.     Allergies:   Patient has no known allergies.   Social History   Socioeconomic History   Marital status: Widowed    Spouse name: Not on file   Number of children: Not on file   Years of education: Not on file   Highest education level: Not on file  Occupational History   Not on file  Tobacco Use   Smoking status: Some Days    Types: Cigars    Last attempt to quit: 07/14/2018    Years since quitting: 3.5   Smokeless tobacco: Never  Vaping Use   Vaping Use: Never used  Substance and Sexual Activity   Alcohol use: Not Currently    Comment: quit 03/09/20   Drug use: Not Currently    Types: Marijuana    Comment: quit 03/09/20   Sexual activity: Not Currently  Other Topics Concern   Not on file  Social History Narrative   Not on file   Social Determinants of Health   Financial Resource Strain: Not on file  Food Insecurity: No Food Insecurity (03/20/2020)   Hunger Vital Sign    Worried About Running Out of Food in the Last Year: Never true    Ran Out of Food in the Last Year: Never true  Transportation Needs: No Transportation Needs (03/20/2020)   PRAPARE -  Administrator, Civil Service (Medical): No    Lack of Transportation (Non-Medical): No  Physical Activity: Not on file  Stress: Not on file  Social Connections: Not on file     Family History: The patient's family history includes Hypertension in his father. There is no history of Cancer or Diabetes.  ROS:   Please see the history of present illness.    All other systems reviewed and are negative.  EKGs/Labs/Other Studies Reviewed:    The following studies were reviewed today:    Recent Labs: 09/15/2021: B Natriuretic Peptide 66.2; Magnesium 1.8 11/03/2021: Hemoglobin 12.4; Platelets  230 12/21/2021: ALT 10; BUN 13; Creatinine, Ser 1.30; Potassium 4.1; Sodium 136; TSH 1.621  Recent Lipid Panel No results found for: "CHOL", "TRIG", "HDL", "CHOLHDL", "VLDL", "LDLCALC", "LDLDIRECT"  Physical Exam:    VS:  There were no vitals taken for this visit.    Wt Readings from Last 3 Encounters:  12/27/21 194 lb (88 kg)  12/21/21 195 lb 12.8 oz (88.8 kg)  11/23/21 195 lb (88.5 kg)     GEN: *** Well nourished, well developed in no acute distress HEENT: Normal NECK: No JVD; No carotid bruits LYMPHATICS: No lymphadenopathy CARDIAC: ***RRR, no murmurs, rubs, gallops RESPIRATORY:  Clear to auscultation without rales, wheezing or rhonchi  ABDOMEN: Soft, non-tender, non-distended MUSCULOSKELETAL:  No edema; No deformity  SKIN: Warm and dry NEUROLOGIC:  Alert and oriented x 3 PSYCHIATRIC:  Normal affect        ASSESSMENT:    1. Typical atrial flutter (HCC)   2. Chronic systolic congestive heart failure (HCC)   3. Primary hypertension    PLAN:    In order of problems listed above:  #Typical AFL S/p ablation. No recurrence since ablation. Stopped amiodarone 12/27/2021. Will stop apixaban today after ILR implant.   Given history of tachycardia mediated cardiomyopathy, close surveillance for AF/AFL is necessary. He presents today for ILR. Procedure reviewed including risks and monthly monitoring costs and he wishes to proceed.         Total time spent with patient today *** minutes. This includes reviewing records, evaluating the patient and coordinating care.   Medication Adjustments/Labs and Tests Ordered: Current medicines are reviewed at length with the patient today.  Concerns regarding medicines are outlined above.  No orders of the defined types were placed in this encounter.  No orders of the defined types were placed in this encounter.    Signed, Steffanie Dunn, MD, [facility], [facility] 02/14/2022 10:16 AM    Electrophysiology [facility]  HeartCare   --------------------  SURGEON:  Lanier Prude, MD     PREPROCEDURE DIAGNOSIS:  {LOOPDIAGNOSES:27737::"Cryptogenic stroke"}    POSTPROCEDURE DIAGNOSIS: {LOOPDIAGNOSES:27737::"Cryptogenic stroke"}     PROCEDURES:   1. Implantable loop recorder implantation    INTRODUCTION:  Bryan Guerrero presents with a history of {LOOPDIAGNOSESlowercase:27738::"cryptogenic stroke"} The costs of loop recorder monitoring have been discussed with the patient.    DESCRIPTION OF PROCEDURE:  Informed written consent was obtained.  The patient required no sedation for the procedure today.  Mapping over the patient's chest was performed to identify the area where electrograms were most prominent for ILR recording.  This area was found to be the left parasternal region over the 4th intercostal space. The patients left chest was therefore prepped and draped in the usual sterile fashion. The skin overlying the left parasternal region was infiltrated with lidocaine for local analgesia.  A 0.5-cm incision was made over the  left parasternal region over the 3rd intercostal space.  A subcutaneous ILR pocket was fashioned using a combination of sharp and blunt dissection.  A {LOOPLIST:27736} implantable loop recorder was then placed into the pocket  R waves were very prominent and measured >0.41mV.  Steri- Strips and a sterile dressing were then applied.  There were no early apparent complications.     CONCLUSIONS:   1. Successful implantation of a implantable loop recorder for {LOOPDIAGNOSES:27737::"Cryptogenic stroke"}  2. No early apparent complications.   Lysbeth Galas T. Quentin Ore, MD, [facility], [facility] Cardiac Electrophysiology

## 2022-02-15 ENCOUNTER — Ambulatory Visit: Payer: No Typology Code available for payment source | Attending: Cardiology | Admitting: Cardiology

## 2022-02-15 ENCOUNTER — Encounter: Payer: Self-pay | Admitting: Cardiology

## 2022-02-15 VITALS — BP 100/70 | HR 68 | Ht 72.0 in | Wt 188.0 lb

## 2022-02-15 DIAGNOSIS — I10 Essential (primary) hypertension: Secondary | ICD-10-CM | POA: Diagnosis not present

## 2022-02-15 DIAGNOSIS — I110 Hypertensive heart disease with heart failure: Secondary | ICD-10-CM

## 2022-02-15 DIAGNOSIS — I4891 Unspecified atrial fibrillation: Secondary | ICD-10-CM | POA: Diagnosis not present

## 2022-02-15 DIAGNOSIS — I483 Typical atrial flutter: Secondary | ICD-10-CM | POA: Diagnosis not present

## 2022-02-15 DIAGNOSIS — I5022 Chronic systolic (congestive) heart failure: Secondary | ICD-10-CM | POA: Diagnosis not present

## 2022-02-15 NOTE — Progress Notes (Signed)
Electrophysiology Office Follow up Visit Note:    Date:  02/15/2022   ID:  Bryan Guerrero, DOB 22-Apr-1971, MRN [id number]  PCP:  Storm Frisk, MD  [facility] Cardiologist:  Thurmon Fair, MD  [facility] Electrophysiologist:  Lanier Prude, MD    Interval History:    Bryan Guerrero is a 51 y.o. male who presents for a follow up visit. They were last seen in clinic 12/27/2021.  From 12/27/2021 note. "He underwent a successful atrial flutter ablation November 23, 2021.  After the ablation he is maintained normal rhythm.  Repeat echocardiogram in sinus rhythm on July 11 shows normalization of the left ventricular function.  He is still taking amiodarone and Eliquis."   At the last appointment we planned to stop amiodarone. He wanted to stop eliquis and we discussed using a loop recorder for ongoing rhythm surveillance.   Today, he appears well.  He denies any palpitations, chest pain, shortness of breath, or peripheral edema. No lightheadedness, headaches, syncope, orthopnea, or PND.      Past Medical History:  Diagnosis Date   Alcohol abuse    Asthma    Cardiomyopathy (HCC)    Dyspnea    Hypertension     Past Surgical History:  Procedure Laterality Date   A-FLUTTER ABLATION N/A 11/23/2021   Procedure: A-FLUTTER ABLATION;  Surgeon: Lanier Prude, MD;  Location: [facility] INVASIVE CV LAB;  Service: Cardiovascular;  Laterality: N/A;   CARDIOVERSION N/A 03/17/2020   Procedure: CARDIOVERSION;  Surgeon: Lewayne Bunting, MD;  Location: [facility] ENDOSCOPY;  Service: Cardiovascular;  Laterality: N/A;   CARDIOVERSION N/A 09/16/2021   Procedure: CARDIOVERSION;  Surgeon: Laurey Morale, MD;  Location: [facility] ENDOSCOPY;  Service: Cardiovascular;  Laterality: N/A;   RIGHT/LEFT HEART CATH AND CORONARY ANGIOGRAPHY N/A 03/19/2020   Procedure: RIGHT/LEFT HEART CATH AND CORONARY ANGIOGRAPHY;  Surgeon: Laurey Morale, MD;  Location: [facility] INVASIVE CV LAB;  Service: Cardiovascular;  Laterality:  N/A;   TEE WITHOUT CARDIOVERSION  10/052021   TEE WITHOUT CARDIOVERSION N/A 03/17/2020   Procedure: TRANSESOPHAGEAL ECHOCARDIOGRAM (TEE);  Surgeon: Lewayne Bunting, MD;  Location: [facility] ENDOSCOPY;  Service: Cardiovascular;  Laterality: N/A;    Current Medications: Current Meds  Medication Sig   acetaminophen (TYLENOL) 500 MG tablet Take 1,000 mg by mouth every 6 (six) hours as needed for mild pain.   apixaban (ELIQUIS) 5 MG TABS tablet Take 1 tablet (5 mg total) by mouth 2 (two) times daily.   carvedilol (COREG) 25 MG tablet Take 1 tablet (25 mg total) by mouth 2 (two) times daily.   dapagliflozin propanediol (FARXIGA) 10 MG TABS tablet Take 1 tablet (10 mg total) by mouth daily.   furosemide (LASIX) 20 MG tablet Take 1 tablet (20 mg total) by mouth daily as needed. For > 3 lb weight gain in 24 hr or > 5 lb weight gain in 1 week   Multiple Vitamin (MULTIVITAMIN WITH MINERALS) TABS tablet Take 1 tablet by mouth daily.   sacubitril-valsartan (ENTRESTO) 97-103 MG Take 1 tablet by mouth 2 (two) times daily.   spironolactone (ALDACTONE) 25 MG tablet Take 1 tablet by mouth once daily     Allergies:   Patient has no known allergies.   Social History   Socioeconomic History   Marital status: Widowed    Spouse name: Not on file   Number of children: Not on file   Years of education: Not on file   Highest education level: Not on file  Occupational  History   Not on file  Tobacco Use   Smoking status: Some Days    Types: Cigars    Last attempt to quit: 07/14/2018    Years since quitting: 3.5   Smokeless tobacco: Never  Vaping Use   Vaping Use: Never used  Substance and Sexual Activity   Alcohol use: Not Currently    Comment: quit 03/09/20   Drug use: Not Currently    Types: Marijuana    Comment: quit 03/09/20   Sexual activity: Not Currently  Other Topics Concern   Not on file  Social History Narrative   Not on file   Social Determinants of Health   Financial Resource Strain: Not  on file  Food Insecurity: No Food Insecurity (03/20/2020)   Hunger Vital Sign    Worried About Running Out of Food in the Last Year: Never true    Ran Out of Food in the Last Year: Never true  Transportation Needs: No Transportation Needs (03/20/2020)   PRAPARE - Administrator, Civil Service (Medical): No    Lack of Transportation (Non-Medical): No  Physical Activity: Not on file  Stress: Not on file  Social Connections: Not on file     Family History: The patient's family history includes Hypertension in his father. There is no history of Cancer or Diabetes.  ROS:   Please see the history of present illness.    All other systems reviewed and are negative.  EKGs/Labs/Other Studies Reviewed:    The following studies were reviewed today:  12/21/2021  Echocardiogram:  1. Left ventricular ejection fraction, by estimation, is 45 to 50%. Left  ventricular ejection fraction by 3D volume is 50 %. The left ventricle has  mildly decreased function. The left ventricle demonstrates global  hypokinesis. Left ventricular diastolic   parameters were normal.   2. Right ventricular systolic function is normal. The right ventricular  size is mildly enlarged.   3. Left atrial size was moderately dilated.   4. The mitral valve is normal in structure. No evidence of mitral valve  regurgitation. No evidence of mitral stenosis.   5. The aortic valve is normal in structure. Aortic valve regurgitation is  not visualized. No aortic stenosis is present.   6. The inferior vena cava is normal in size with greater than 50%  respiratory variability, suggesting right atrial pressure of 3 mmHg.   Comparison(s): Prior images reviewed side by side. The left ventricular  function has improved. Prior 09/17/21: EF 20-25%.   03/19/2020  Right/Left Heart Cath: 1. Mildly elevated PCWP, normal RA pressure.  2. Good cardiac output on milrinone 0.25.  3. No significant coronary disease, nonischemic  cardiomyopathy.    EKG:  EKG is personally reviewed.  02/15/2022:  EKG was not ordered.   Recent Labs: 09/15/2021: B Natriuretic Peptide 66.2; Magnesium 1.8 11/03/2021: Hemoglobin 12.4; Platelets 230 12/21/2021: ALT 10; BUN 13; Creatinine, Ser 1.30; Potassium 4.1; Sodium 136; TSH 1.621   Recent Lipid Panel No results found for: "CHOL", "TRIG", "HDL", "CHOLHDL", "VLDL", "LDLCALC", "LDLDIRECT"  Physical Exam:    VS:  BP 100/70 (BP Location: Left Arm, Patient Position: Sitting, Cuff Size: Normal)   Pulse 68   Ht 6' (1.829 m)   Wt 188 lb (85.3 kg)   SpO2 94%   BMI 25.50 kg/m     Wt Readings from Last 3 Encounters:  02/15/22 188 lb (85.3 kg)  12/27/21 194 lb (88 kg)  12/21/21 195 lb 12.8 oz (88.8 kg)  GEN: Well nourished, well developed in no acute distress HEENT: Normal NECK: No JVD; No carotid bruits LYMPHATICS: No lymphadenopathy CARDIAC: RRR, no murmurs, rubs, gallops RESPIRATORY:  Clear to auscultation without rales, wheezing or rhonchi  ABDOMEN: Soft, non-tender, non-distended MUSCULOSKELETAL:  No edema; No deformity  SKIN: Warm and dry NEUROLOGIC:  Alert and oriented x 3 PSYCHIATRIC:  Normal affect        ASSESSMENT:    1. Typical atrial flutter (HCC)   2. Chronic systolic congestive heart failure (HCC)   3. Primary hypertension    PLAN:    In order of problems listed above:  #Typical AFL S/p ablation. No recurrence since ablation. Stopped amiodarone 12/27/2021. Will stop apixaban today after ILR implant.    Given history of tachycardia mediated cardiomyopathy, close surveillance for AF/AFL is necessary. He presents today for ILR. Procedure reviewed including risks and monthly monitoring costs and he wishes to proceed.    Medication Adjustments/Labs and Tests Ordered: Current medicines are reviewed at length with the patient today.  Concerns regarding medicines are outlined above.   No orders of the defined types were placed in this encounter.  No  orders of the defined types were placed in this encounter.  I,Mathew Stumpf,acting as a Neurosurgeon for Lanier Prude, MD.,have documented all relevant documentation on the behalf of Lanier Prude, MD,as directed by  Lanier Prude, MD while in the presence of Lanier Prude, MD.  I, Lanier Prude, MD, have reviewed all documentation for this visit. The documentation on 02/15/22 for the exam, diagnosis, procedures, and orders are all accurate and complete.   Signed, Steffanie Dunn, MD, [facility], [facility] 02/15/2022 9:41 AM    Electrophysiology [facility]  --------------------   SURGEON:  Lanier Prude, MD     PREPROCEDURE DIAGNOSIS:  Atrial fibrillation and flutter management    POSTPROCEDURE DIAGNOSIS: Atrial fibrillation and flutter management     PROCEDURES:   1. Implantable loop recorder implantation    INTRODUCTION:  Bryan Guerrero presents with a history of atrial fibrillation The costs of loop recorder monitoring have been discussed with the patient.    DESCRIPTION OF PROCEDURE:  Informed written consent was obtained.  The patient required no sedation for the procedure today.  Mapping over the patient's chest was performed to identify the area where electrograms were most prominent for ILR recording.  This area was found to be the left parasternal region over the 4th intercostal space. The patients left chest was therefore prepped and draped in the usual sterile fashion. The skin overlying the left parasternal region was infiltrated with lidocaine for local analgesia.  A 0.5-cm incision was made over the left parasternal region over the 3rd intercostal space.  A subcutaneous ILR pocket was fashioned using a combination of sharp and blunt dissection.  A [employer] Assert IQ EL+ ([id number])implantable loop recorder was then placed into the pocket  R waves were very prominent and measured >0.25mV.  Steri- Strips and a sterile dressing were then applied.   There were no early apparent complications.     CONCLUSIONS:   1. Successful implantation of a implantable loop recorder for Atrial fibrillation and flutter management  2. No early apparent complications.    Sheria Lang T. Lalla Brothers, MD, [facility], [facility] Cardiac Electrophysiology

## 2022-02-15 NOTE — Patient Instructions (Signed)
Medication Instructions:  Stop Eliquis  Your physician recommends that you continue on your current medications as directed. Please refer to the Current Medication list given to you today.  Labwork: None ordered.  Testing/Procedures: None ordered.  Follow-Up:  Your physician wants you to follow-up in: one year with APP Renee or Mardelle Matte .  You will receive a reminder letter in the mail two months in advance. If you don't receive a letter, please call our office to schedule the follow-up appointment.    Implantable Loop Recorder Placement, Care After This sheet gives you information about how to care for yourself after your procedure. Your health care provider may also give you more specific instructions. If you have problems or questions, contact your health care provider. What can I expect after the procedure? After the procedure, it is common to have: Soreness or discomfort near the incision. Some swelling or bruising near the incision.  Follow these instructions at home: Incision care  Monitor your cardiac device site for redness, swelling, and drainage. Call the device clinic at [phone] if you experience these symptoms or fever/chills.  Keep the large square bandage on your site for 24 hours and then you may remove it yourself. Keep the steri-strips underneath in place.   You may shower after 72 hours / 3 days from your procedure with the steri-strips in place. They will usually fall off on their own, or may be removed after 10 days. Pat dry.   Avoid lotions, ointments, or perfumes over your incision until it is well-healed.  Please do not submerge in water until your site is completely healed.   Your device is MRI compatible.   Remote monitoring is used to monitor your cardiac device from home. This monitoring is scheduled every month by our office. It allows [place] to keep an eye on the function of your device to ensure it is working properly.  If your wound site starts to  bleed apply pressure.    For help with the monitor please call [employer] Monitor Support Specialist directly at [phone].    If you have any questions/concerns please call the device clinic at [phone].  Activity  Return to your normal activities.  General instructions Follow instructions from your health care provider about how to manage your implantable loop recorder and transmit the information. Learn how to activate a recording if this is necessary for your type of device. You may go through a metal detection gate, and you may let someone hold a metal detector over your chest. Show your ID card if needed. Do not have an MRI unless you check with your health care provider first. Take over-the-counter and prescription medicines only as told by your health care provider. Keep all follow-up visits as told by your health care provider. This is important. Contact a health care provider if: You have redness, swelling, or pain around your incision. You have a fever. You have pain that is not relieved by your pain medicine. You have triggered your device because of fainting (syncope) or because of a heartbeat that feels like it is racing, slow, fluttering, or skipping (palpitations). Get help right away if you have: Chest pain. Difficulty breathing. Summary After the procedure, it is common to have soreness or discomfort near the incision. Change your dressing as told by your health care provider. Follow instructions from your health care provider about how to manage your implantable loop recorder and transmit the information. Keep all follow-up visits as told by your health care  provider. This is important. This information is not intended to replace advice given to you by your health care provider. Make sure you discuss any questions you have with your health care provider. Document Released: 05/11/2015 Document Revised: 07/15/2017 Document Reviewed: 07/15/2017 Elsevier Patient  Education  2020 [employer].

## 2022-02-15 NOTE — Addendum Note (Signed)
Addended by: Sampson Goon on: 02/15/2022 10:13 AM   Modules accepted: Orders

## 2022-03-21 ENCOUNTER — Ambulatory Visit (INDEPENDENT_AMBULATORY_CARE_PROVIDER_SITE_OTHER): Payer: No Typology Code available for payment source

## 2022-03-21 DIAGNOSIS — I483 Typical atrial flutter: Secondary | ICD-10-CM | POA: Diagnosis not present

## 2022-03-22 LAB — CUP PACEART REMOTE DEVICE CHECK
Date Time Interrogation Session: 20231007025106
Implantable Pulse Generator Implant Date: 20230905
Pulse Gen Serial Number: 511011457

## 2022-03-30 NOTE — Progress Notes (Signed)
Carelink Summary Report / Loop Recorder 

## 2022-04-11 DIAGNOSIS — J9601 Acute respiratory failure with hypoxia: Secondary | ICD-10-CM | POA: Insufficient documentation

## 2022-04-11 DIAGNOSIS — J189 Pneumonia, unspecified organism: Secondary | ICD-10-CM | POA: Insufficient documentation

## 2022-04-21 ENCOUNTER — Ambulatory Visit (INDEPENDENT_AMBULATORY_CARE_PROVIDER_SITE_OTHER): Payer: No Typology Code available for payment source

## 2022-04-21 DIAGNOSIS — I5023 Acute on chronic systolic (congestive) heart failure: Secondary | ICD-10-CM

## 2022-04-21 LAB — CUP PACEART REMOTE DEVICE CHECK
Date Time Interrogation Session: 20231109020034
Implantable Pulse Generator Implant Date: 20230905
Pulse Gen Serial Number: 511011457

## 2022-04-29 NOTE — Progress Notes (Signed)
Carelink Summary Report / Loop Recorder 

## 2022-05-23 ENCOUNTER — Ambulatory Visit (INDEPENDENT_AMBULATORY_CARE_PROVIDER_SITE_OTHER): Payer: No Typology Code available for payment source

## 2022-05-23 ENCOUNTER — Other Ambulatory Visit (HOSPITAL_COMMUNITY): Payer: Self-pay | Admitting: Cardiology

## 2022-05-23 DIAGNOSIS — I483 Typical atrial flutter: Secondary | ICD-10-CM | POA: Diagnosis not present

## 2022-05-23 LAB — CUP PACEART REMOTE DEVICE CHECK
Date Time Interrogation Session: 20231211051217
Implantable Pulse Generator Implant Date: 20230905
Pulse Gen Serial Number: 511011457

## 2022-05-26 ENCOUNTER — Telehealth: Payer: Self-pay

## 2022-05-26 MED ORDER — APIXABAN 5 MG PO TABS: 5.0000 mg | ORAL_TABLET | Freq: Two times a day (BID) | ORAL | 3 refills | Status: DC

## 2022-05-26 NOTE — Telephone Encounter (Signed)
Following alert received from [employer] received for two new false AF episodes, SR with ectopy. AF burden is 0% of the time. There were 33 tachycardia events detected with rates of 180-200 bpm, these are irregular and appear to be AF with RVR, sent to triage.  Patient is no longer taking OAC. Sending to Dr. Lalla Brothers for review.

## 2022-05-26 NOTE — Telephone Encounter (Signed)
Called and made patient aware of the atrial fibrillation noted on his ILR. Made patient aware that Dr. Lalla Brothers would like for him to restart Eliquis 5 mg BID. Rx sent to preferred pharmacy. Patient scheduled to see Dr. Lalla Brothers on 12/27 at 4:00 PM to discuss treatment options.

## 2022-06-08 ENCOUNTER — Encounter: Payer: Self-pay | Admitting: Cardiology

## 2022-06-08 ENCOUNTER — Ambulatory Visit: Payer: No Typology Code available for payment source | Attending: Cardiology | Admitting: Cardiology

## 2022-06-08 VITALS — BP 118/74 | HR 70 | Ht 72.0 in | Wt 182.0 lb

## 2022-06-08 DIAGNOSIS — I480 Paroxysmal atrial fibrillation: Secondary | ICD-10-CM | POA: Diagnosis not present

## 2022-06-08 DIAGNOSIS — I5022 Chronic systolic (congestive) heart failure: Secondary | ICD-10-CM

## 2022-06-08 DIAGNOSIS — I483 Typical atrial flutter: Secondary | ICD-10-CM

## 2022-06-08 DIAGNOSIS — F1010 Alcohol abuse, uncomplicated: Secondary | ICD-10-CM | POA: Diagnosis not present

## 2022-06-08 LAB — PACEMAKER DEVICE OBSERVATION

## 2022-06-08 NOTE — Progress Notes (Signed)
Electrophysiology Office Follow up Visit Note:    Date:  06/08/2022   ID:  Bryan Guerrero, DOB 05/12/1971, MRN [id number]  PCP:  Storm Frisk, MD  [facility]Care Cardiologist:  Thurmon Fair, MD  [facility] Electrophysiologist:  Lanier Prude, MD    Interval History:    Bryan Guerrero is a 51 y.o. male who presents for a follow up visit.  He had an AFL ablation 11/23/2021. He had a ILR placed which has subsequently shown AF.   Was hospitalized at [facility] in Oct/Nov for Sepsis 2/2 pneumonia.  Today, he states that he generally has not felt his heart out of rhythm.  He is compliant with Eliquis; no medication intolerances at this time.  He denies any palpitations, chest pain, shortness of breath, or peripheral edema. No lightheadedness, headaches, syncope, orthopnea, or PND.      Past Medical History:  Diagnosis Date   Alcohol abuse    Asthma    Cardiomyopathy (HCC)    Dyspnea    Hypertension     Past Surgical History:  Procedure Laterality Date   A-FLUTTER ABLATION N/A 11/23/2021   Procedure: A-FLUTTER ABLATION;  Surgeon: Lanier Prude, MD;  Location: [facility] INVASIVE CV LAB;  Service: Cardiovascular;  Laterality: N/A;   CARDIOVERSION N/A 03/17/2020   Procedure: CARDIOVERSION;  Surgeon: Lewayne Bunting, MD;  Location: [facility] ENDOSCOPY;  Service: Cardiovascular;  Laterality: N/A;   CARDIOVERSION N/A 09/16/2021   Procedure: CARDIOVERSION;  Surgeon: Laurey Morale, MD;  Location: [facility] ENDOSCOPY;  Service: Cardiovascular;  Laterality: N/A;   RIGHT/LEFT HEART CATH AND CORONARY ANGIOGRAPHY N/A 03/19/2020   Procedure: RIGHT/LEFT HEART CATH AND CORONARY ANGIOGRAPHY;  Surgeon: Laurey Morale, MD;  Location: [facility] INVASIVE CV LAB;  Service: Cardiovascular;  Laterality: N/A;   TEE WITHOUT CARDIOVERSION  10/052021   TEE WITHOUT CARDIOVERSION N/A 03/17/2020   Procedure: TRANSESOPHAGEAL ECHOCARDIOGRAM (TEE);  Surgeon: Lewayne Bunting, MD;  Location[facility] ENDOSCOPY;  Service:  Cardiovascular;  Laterality: N/A;    Current Medications: Current Meds  Medication Sig   acetaminophen (TYLENOL) 500 MG tablet Take 1,000 mg by mouth every 6 (six) hours as needed for mild pain.   apixaban (ELIQUIS) 5 MG TABS tablet Take 1 tablet (5 mg total) by mouth 2 (two) times daily.   carvedilol (COREG) 25 MG tablet Take 1 tablet (25 mg total) by mouth 2 (two) times daily.   dapagliflozin propanediol (FARXIGA) 10 MG TABS tablet Take 1 tablet (10 mg total) by mouth daily.   furosemide (LASIX) 20 MG tablet Take 1 tablet (20 mg total) by mouth daily as needed. For > 3 lb weight gain in 24 hr or > 5 lb weight gain in 1 week   Multiple Vitamin (MULTIVITAMIN WITH MINERALS) TABS tablet Take 1 tablet by mouth daily.   sacubitril-valsartan (ENTRESTO) 97-103 MG Take 1 tablet by mouth 2 (two) times daily.   spironolactone (ALDACTONE) 25 MG tablet Take 1 tablet by mouth once daily     Allergies:   Patient has no allergy information on record.   Social History   Socioeconomic History   Marital status: Widowed    Spouse name: Not on file   Number of children: Not on file   Years of education: Not on file   Highest education level: Not on file  Occupational History   Not on file  Tobacco Use   Smoking status: Some Days    Types: Cigars    Last attempt to quit: 07/14/2018  Years since quitting: 3.9   Smokeless tobacco: Never  Vaping Use   Vaping Use: Never used  Substance and Sexual Activity   Alcohol use: Not Currently    Comment: quit 03/09/20   Drug use: Not Currently    Types: Marijuana    Comment: quit 03/09/20   Sexual activity: Not Currently  Other Topics Concern   Not on file  Social History Narrative   Not on file   Social Determinants of Health   Financial Resource Strain: Not on file  Food Insecurity: No Food Insecurity (03/20/2020)   Hunger Vital Sign    Worried About Running Out of Food in the Last Year: Never true    Ran Out of Food in the Last Year: Never true   Transportation Needs: No Transportation Needs (03/20/2020)   PRAPARE - Administrator, Civil Service (Medical): No    Lack of Transportation (Non-Medical): No  Physical Activity: Not on file  Stress: Not on file  Social Connections: Not on file     Family History: The patient's family history includes Hypertension in his father. There is no history of Cancer or Diabetes.  ROS:   Please see the history of present illness.    All other systems reviewed and are negative.  EKGs/Labs/Other Studies Reviewed:    The following studies were reviewed today:  ILR Alert     Today's loop interrogation shows 4 episodes of atrial fibrillation occurring on December 26 and 27.  He is actually in atrial fibrillation at the time of our clinic appointment.  Total burden of atrial fibrillation is 3%.    Recent Labs: 09/15/2021: B Natriuretic Peptide 66.2; Magnesium 1.8 11/03/2021: Hemoglobin 12.4; Platelets 230 12/21/2021: ALT 10; BUN 13; Creatinine, Ser 1.30; Potassium 4.1; Sodium 136; TSH 1.621   Recent Lipid Panel No results found for: "CHOL", "TRIG", "HDL", "CHOLHDL", "VLDL", "LDLCALC", "LDLDIRECT"  Physical Exam:    VS:  BP 118/74   Pulse 70   Ht 6' (1.829 m)   Wt 182 lb (82.6 kg)   SpO2 96%   BMI 24.68 kg/m     Wt Readings from Last 3 Encounters:  06/08/22 182 lb (82.6 kg)  02/15/22 188 lb (85.3 kg)  12/27/21 194 lb (88 kg)     GEN: Well nourished, well developed in no acute distress HEENT: Normal NECK: No JVD; No carotid bruits LYMPHATICS: No lymphadenopathy CARDIAC: Irregularly irregular, no murmurs, rubs, gallops.  Prepectoral pocket well healed. RESPIRATORY:  Clear to auscultation without rales, wheezing or rhonchi  ABDOMEN: Soft, non-tender, non-distended MUSCULOSKELETAL:  No edema; No deformity  SKIN: Warm and dry NEUROLOGIC:  Alert and oriented x 3 PSYCHIATRIC:  Normal affect        ASSESSMENT:    1. Typical atrial flutter (HCC)   2. Alcohol  abuse   3. Chronic systolic congestive heart failure (HCC)   4. Paroxysmal atrial fibrillation (HCC)    PLAN:    In order of problems listed above:  #pAF New diagnosis after previous AFL ablation. Rhythm control indicated given history of reduced LV function. On Eliquis for stroke ppx. On Coreg 25mg  PO BID. Discussed treatment options for AF including continued conservative management, AAD and catheter ablation. Recommended following burden for next 6 months using ILR. If continues to have episodes, would favor catheter ablation to avoid long term exposure to AAD.   #Chronic systolic HF NYHA II today. Warm and dry. Cont current medical therapy.  Follow up 6 months with APP.  If burden of atrial fibrillation increases, favor discussion about catheter ablation.   Medication Adjustments/Labs and Tests Ordered: Current medicines are reviewed at length with the patient today.  Concerns regarding medicines are outlined above.   No orders of the defined types were placed in this encounter.  No orders of the defined types were placed in this encounter.  I,Mathew Stumpf,acting as a Neurosurgeon for Lanier Prude, MD.,have documented all relevant documentation on the behalf of Lanier Prude, MD,as directed by  Lanier Prude, MD while in the presence of Lanier Prude, MD.  I, Lanier Prude, MD, have reviewed all documentation for this visit. The documentation on 06/08/22 for the exam, diagnosis, procedures, and orders are all accurate and complete.   Signed, Steffanie Dunn, MD, [facility], [facility] 06/08/2022 5:01 PM    Electrophysiology [facility]

## 2022-06-08 NOTE — Progress Notes (Deleted)
Electrophysiology Office Follow up Visit Note:    Date:  06/08/2022   ID:  Bryan Guerrero, DOB [birth year], MRN [id number]  PCP:  Storm Frisk, MD  [facility] Cardiologist:  Thurmon Fair, MD  [facility] Electrophysiologist:  Lanier Prude, MD    Interval History:    Bryan Guerrero is a 51 y.o. male who presents for a follow up visit.  He had an AFL ablation 11/23/2021. He had a ILR placed which has subsequently shown AF.   Was hospitalized at [facility] in Oct/Nov for Sepsis 2/2 pneumonia.       Past Medical History:  Diagnosis Date   Alcohol abuse    Asthma    Cardiomyopathy (HCC)    Dyspnea    Hypertension     Past Surgical History:  Procedure Laterality Date   A-FLUTTER ABLATION N/A 11/23/2021   Procedure: A-FLUTTER ABLATION;  Surgeon: Lanier Prude, MD;  Location: [facility] INVASIVE CV LAB;  Service: Cardiovascular;  Laterality: N/A;   CARDIOVERSION N/A 03/17/2020   Procedure: CARDIOVERSION;  Surgeon: Lewayne Bunting, MD;  Location: [facility] ENDOSCOPY;  Service: Cardiovascular;  Laterality: N/A;   CARDIOVERSION N/A 09/16/2021   Procedure: CARDIOVERSION;  Surgeon: Laurey Morale, MD;  Location: [facility] ENDOSCOPY;  Service: Cardiovascular;  Laterality: N/A;   RIGHT/LEFT HEART CATH AND CORONARY ANGIOGRAPHY N/A 03/19/2020   Procedure: RIGHT/LEFT HEART CATH AND CORONARY ANGIOGRAPHY;  Surgeon: Laurey Morale, MD;  Location: [facility] INVASIVE CV LAB;  Service: Cardiovascular;  Laterality: N/A;   TEE WITHOUT CARDIOVERSION  10/052021   TEE WITHOUT CARDIOVERSION N/A 03/17/2020   Procedure: TRANSESOPHAGEAL ECHOCARDIOGRAM (TEE);  Surgeon: Lewayne Bunting, MD;  Location: [facility] ENDOSCOPY;  Service: Cardiovascular;  Laterality: N/A;    Current Medications: No outpatient medications have been marked as taking for the 06/08/22 encounter (Appointment) with Lanier Prude, MD.     Allergies:   Patient has no known allergies.   Social History   Socioeconomic History   Marital  status: Widowed    Spouse name: Not on file   Number of children: Not on file   Years of education: Not on file   Highest education level: Not on file  Occupational History   Not on file  Tobacco Use   Smoking status: Some Days    Types: Cigars    Last attempt to quit: 07/14/2018    Years since quitting: 3.9   Smokeless tobacco: Never  Vaping Use   Vaping Use: Never used  Substance and Sexual Activity   Alcohol use: Not Currently    Comment: quit 03/09/20   Drug use: Not Currently    Types: Marijuana    Comment: quit 03/09/20   Sexual activity: Not Currently  Other Topics Concern   Not on file  Social History Narrative   Not on file   Social Determinants of Health   Financial Resource Strain: Not on file  Food Insecurity: No Food Insecurity (03/20/2020)   Hunger Vital Sign    Worried About Running Out of Food in the Last Year: Never true    Ran Out of Food in the Last Year: Never true  Transportation Needs: No Transportation Needs (03/20/2020)   PRAPARE - Administrator, Civil Service (Medical): No    Lack of Transportation (Non-Medical): No  Physical Activity: Not on file  Stress: Not on file  Social Connections: Not on file     Family History: The patient's family history includes Hypertension in his father. There  is no history of Cancer or Diabetes.  ROS:   Please see the history of present illness.    All other systems reviewed and are negative.  EKGs/Labs/Other Studies Reviewed:    The following studies were reviewed today:  ILR Alert     Recent Labs: 09/15/2021: B Natriuretic Peptide 66.2; Magnesium 1.8 11/03/2021: Hemoglobin 12.4; Platelets 230 12/21/2021: ALT 10; BUN 13; Creatinine, Ser 1.30; Potassium 4.1; Sodium 136; TSH 1.621  Recent Lipid Panel No results found for: "CHOL", "TRIG", "HDL", "CHOLHDL", "VLDL", "LDLCALC", "LDLDIRECT"  Physical Exam:    VS:  There were no vitals taken for this visit.    Wt Readings from Last 3 Encounters:   02/15/22 188 lb (85.3 kg)  12/27/21 194 lb (88 kg)  12/21/21 195 lb 12.8 oz (88.8 kg)     GEN: *** Well nourished, well developed in no acute distress HEENT: Normal NECK: No JVD; No carotid bruits LYMPHATICS: No lymphadenopathy CARDIAC: ***RRR, no murmurs, rubs, gallops RESPIRATORY:  Clear to auscultation without rales, wheezing or rhonchi  ABDOMEN: Soft, non-tender, non-distended MUSCULOSKELETAL:  No edema; No deformity  SKIN: Warm and dry NEUROLOGIC:  Alert and oriented x 3 PSYCHIATRIC:  Normal affect        ASSESSMENT:    1. Typical atrial flutter (HCC)   2. Alcohol abuse   3. Chronic systolic congestive heart failure (HCC)   4. Paroxysmal atrial fibrillation (HCC)    PLAN:    In order of problems listed above:  #pAF New diagnosis after previous AFL ablation. Rhythm control indicated given history of reduced LV function. On Eliquis for stroke ppx. On Coreg 25mg  PO BID. Discussed treatment options for AF including continued conservative management, AAD and catheter ablation. Recommended following burden for next 6 months using ILR. If continues to have episodes, would favor catheter ablation to avoid long term exposure to AAD.   #Chronic systolic HF NYHA II today. Warm and dry. Cont current medical therapy.  Follow up 6 months with APP.      Medication Adjustments/Labs and Tests Ordered: Current medicines are reviewed at length with the patient today.  Concerns regarding medicines are outlined above.  No orders of the defined types were placed in this encounter.  No orders of the defined types were placed in this encounter.    Signed, , MD, [facility], [facility] 06/08/2022 5:52 AM    Electrophysiology [facility]

## 2022-06-08 NOTE — Patient Instructions (Signed)
Medication Instructions:  Your physician recommends that you continue on your current medications as directed. Please refer to the Current Medication list given to you today.  *If you need a refill on your cardiac medications before your next appointment, please call your pharmacy*  Follow-Up: At [facility], you and your health needs are our priority.  As part of our continuing mission to provide you with exceptional heart care, we have created designated Provider Care Teams.  These Care Teams include your primary Cardiologist (physician) and Advanced Practice Providers (APPs -  Physician Assistants and Nurse Practitioners) who all work together to provide you with the care you need, when you need it.  Your next appointment:   6 month(s)  The format for your next appointment:   In Person  Provider:   You will see one of the following Advanced Practice Providers on your designated Care Team:   Renee Ursuy, PA-C Michael "Andy" Tillery, PA-C  Important Information About Sugar       

## 2022-06-20 ENCOUNTER — Other Ambulatory Visit (HOSPITAL_COMMUNITY): Payer: Self-pay | Admitting: Cardiology

## 2022-06-23 ENCOUNTER — Ambulatory Visit: Payer: No Typology Code available for payment source

## 2022-06-23 DIAGNOSIS — I483 Typical atrial flutter: Secondary | ICD-10-CM

## 2022-06-23 LAB — CUP PACEART REMOTE DEVICE CHECK
Date Time Interrogation Session: 20240111085903
Implantable Pulse Generator Implant Date: 20230905
Pulse Gen Serial Number: 511011457

## 2022-06-30 NOTE — Progress Notes (Signed)
Carelink Summary Report / Loop Recorder

## 2022-07-13 ENCOUNTER — Encounter (HOSPITAL_COMMUNITY): Payer: Self-pay | Admitting: Internal Medicine

## 2022-07-13 ENCOUNTER — Telehealth: Payer: Self-pay

## 2022-07-13 ENCOUNTER — Emergency Department (HOSPITAL_COMMUNITY): Payer: 59

## 2022-07-13 ENCOUNTER — Other Ambulatory Visit: Payer: Self-pay

## 2022-07-13 ENCOUNTER — Inpatient Hospital Stay (HOSPITAL_COMMUNITY)
Admission: EM | Admit: 2022-07-13 | Discharge: 2022-07-19 | DRG: 277 | Disposition: A | Discharge status: Home / Self Care | Payer: 59 | Attending: Internal Medicine | Admitting: Internal Medicine

## 2022-07-13 DIAGNOSIS — I5022 Chronic systolic (congestive) heart failure: Secondary | ICD-10-CM

## 2022-07-13 DIAGNOSIS — I110 Hypertensive heart disease with heart failure: Secondary | ICD-10-CM | POA: Diagnosis present

## 2022-07-13 DIAGNOSIS — Z79899 Other long term (current) drug therapy: Secondary | ICD-10-CM

## 2022-07-13 DIAGNOSIS — R000 Tachycardia, unspecified: Secondary | ICD-10-CM | POA: Diagnosis present

## 2022-07-13 DIAGNOSIS — I10 Essential (primary) hypertension: Secondary | ICD-10-CM | POA: Diagnosis not present

## 2022-07-13 DIAGNOSIS — I5041 Acute combined systolic (congestive) and diastolic (congestive) heart failure: Secondary | ICD-10-CM

## 2022-07-13 DIAGNOSIS — Z91148 Patient's other noncompliance with medication regimen for other reason: Secondary | ICD-10-CM

## 2022-07-13 DIAGNOSIS — I493 Ventricular premature depolarization: Secondary | ICD-10-CM | POA: Diagnosis present

## 2022-07-13 DIAGNOSIS — E860 Dehydration: Secondary | ICD-10-CM | POA: Diagnosis present

## 2022-07-13 DIAGNOSIS — I480 Paroxysmal atrial fibrillation: Secondary | ICD-10-CM

## 2022-07-13 DIAGNOSIS — J45909 Unspecified asthma, uncomplicated: Secondary | ICD-10-CM | POA: Diagnosis present

## 2022-07-13 DIAGNOSIS — I426 Alcoholic cardiomyopathy: Secondary | ICD-10-CM | POA: Diagnosis present

## 2022-07-13 DIAGNOSIS — F1010 Alcohol abuse, uncomplicated: Secondary | ICD-10-CM | POA: Diagnosis present

## 2022-07-13 DIAGNOSIS — Z7901 Long term (current) use of anticoagulants: Secondary | ICD-10-CM

## 2022-07-13 DIAGNOSIS — I4892 Unspecified atrial flutter: Secondary | ICD-10-CM | POA: Diagnosis present

## 2022-07-13 DIAGNOSIS — N179 Acute kidney failure, unspecified: Secondary | ICD-10-CM | POA: Insufficient documentation

## 2022-07-13 DIAGNOSIS — Z8249 Family history of ischemic heart disease and other diseases of the circulatory system: Secondary | ICD-10-CM

## 2022-07-13 DIAGNOSIS — F17210 Nicotine dependence, cigarettes, uncomplicated: Secondary | ICD-10-CM | POA: Diagnosis present

## 2022-07-13 DIAGNOSIS — I472 Ventricular tachycardia, unspecified: Secondary | ICD-10-CM | POA: Diagnosis present

## 2022-07-13 DIAGNOSIS — F17290 Nicotine dependence, other tobacco product, uncomplicated: Secondary | ICD-10-CM | POA: Diagnosis present

## 2022-07-13 DIAGNOSIS — I428 Other cardiomyopathies: Secondary | ICD-10-CM | POA: Diagnosis present

## 2022-07-13 LAB — COMPREHENSIVE METABOLIC PANEL
ALT: 16 U/L (ref 0–44)
AST: 28 U/L (ref 15–41)
Albumin: 3.6 g/dL (ref 3.5–5.0)
Albumin: 3.4 g/dL — ABNORMAL LOW (ref 3.5–5.0)
Alkaline Phosphatase: 54 U/L (ref 38–126)
Anion gap: 11 (ref 5–15)
Anion gap: 9 (ref 5–15)
BUN: 15 mg/dL (ref 6–20)
BUN: 14 mg/dL (ref 6–20)
CO2: 23 mmol/L (ref 22–32)
CO2: 21 mmol/L — ABNORMAL LOW (ref 22–32)
Calcium: 9.2 mg/dL (ref 8.9–10.3)
Calcium: 9 mg/dL (ref 8.9–10.3)
Chloride: 103 mmol/L (ref 98–111)
Chloride: 108 mmol/L (ref 98–111)
Creatinine, Ser: 1.79 mg/dL — ABNORMAL HIGH (ref 0.61–1.24)
Creatinine, Ser: 1.68 mg/dL — ABNORMAL HIGH (ref 0.61–1.24)
GFR, Estimated: 46 mL/min — ABNORMAL LOW (ref >60)
GFR, Estimated: 50 mL/min — ABNORMAL LOW (ref >60)
Glucose, Bld: 99 mg/dL (ref 70–99)
Glucose, Bld: 79 mg/dL (ref 70–99)
Potassium: 4 mmol/L (ref 3.5–5.1)
Sodium: 137 mmol/L (ref 135–145)
Sodium: 138 mmol/L (ref 135–145)
Total Bilirubin: 1.5 mg/dL — ABNORMAL HIGH (ref 0.3–1.2)
Total Protein: 5.8 g/dL — ABNORMAL LOW (ref 6.5–8.1)
Total Protein: 6.1 g/dL — ABNORMAL LOW (ref 6.5–8.1)

## 2022-07-13 LAB — I-STAT CHEM 8, ED
BUN: 18 mg/dL (ref 6–20)
Calcium, Ion: 1.18 mmol/L (ref 1.15–1.40)
Chloride: 104 mmol/L (ref 98–111)
Creatinine, Ser: 1.6 mg/dL — ABNORMAL HIGH (ref 0.61–1.24)
Glucose, Bld: 75 mg/dL (ref 70–99)
HCT: 53 % — ABNORMAL HIGH (ref 39.0–52.0)
Hemoglobin: 18 g/dL — ABNORMAL HIGH (ref 13.0–17.0)
Potassium: 4 mmol/L (ref 3.5–5.1)
Sodium: 142 mmol/L (ref 135–145)
TCO2: 26 mmol/L (ref 22–32)

## 2022-07-13 LAB — CBC WITH DIFFERENTIAL/PLATELET
Abs Immature Granulocytes: 0.04 10*3/uL (ref 0.00–0.07)
Basophils Absolute: 0 10*3/uL (ref 0.0–0.1)
Basophils Relative: 1 %
Eosinophils Absolute: 0 10*3/uL (ref 0.0–0.5)
Eosinophils Relative: 0 %
HCT: 51.5 % (ref 39.0–52.0)
Hemoglobin: 17.8 g/dL — ABNORMAL HIGH (ref 13.0–17.0)
Immature Granulocytes: 1 %
Lymphocytes Relative: 17 %
Lymphs Abs: 1.3 10*3/uL (ref 0.7–4.0)
MCH: 32.4 pg (ref 26.0–34.0)
MCHC: 34.6 g/dL (ref 30.0–36.0)
MCV: 93.8 fL (ref 80.0–100.0)
Monocytes Absolute: 0.5 10*3/uL (ref 0.1–1.0)
Monocytes Relative: 6 %
Neutro Abs: 5.8 10*3/uL (ref 1.7–7.7)
Neutrophils Relative %: 75 %
Platelets: 247 10*3/uL (ref 150–400)
RBC: 5.49 MIL/uL (ref 4.22–5.81)
RDW: 13.9 % (ref 11.5–15.5)
WBC: 7.6 10*3/uL (ref 4.0–10.5)
nRBC: 0 % (ref 0.0–0.2)

## 2022-07-13 LAB — TROPONIN I (HIGH SENSITIVITY)
Troponin I (High Sensitivity): 47 ng/L — ABNORMAL HIGH (ref <18)
Troponin I (High Sensitivity): 60 ng/L — ABNORMAL HIGH (ref <18)

## 2022-07-13 LAB — BRAIN NATRIURETIC PEPTIDE: B Natriuretic Peptide: 76.9 pg/mL (ref 0.0–100.0)

## 2022-07-13 LAB — LACTIC ACID, PLASMA: Lactic Acid, Venous: 1.9 mmol/L (ref 0.5–1.9)

## 2022-07-13 LAB — MAGNESIUM

## 2022-07-13 LAB — PROTIME-INR
INR: 1.5 — ABNORMAL HIGH (ref 0.8–1.2)
Prothrombin Time: 17.7 seconds — ABNORMAL HIGH (ref 11.4–15.2)

## 2022-07-13 LAB — TSH: TSH: 1.317 u[IU]/mL (ref 0.350–4.500)

## 2022-07-13 MED ORDER — SPIRONOLACTONE 12.5 MG HALF TABLET: 25.0000 mg | ORAL_TABLET | Freq: Every day | ORAL | Status: DC

## 2022-07-13 MED ORDER — ACETAMINOPHEN 325 MG PO TABS: 650.0000 mg | ORAL_TABLET | Freq: Four times a day (QID) | ORAL | Status: DC | PRN

## 2022-07-13 MED ORDER — ACETAMINOPHEN 650 MG RE SUPP: 650.0000 mg | Freq: Four times a day (QID) | RECTAL | Status: DC | PRN

## 2022-07-13 MED ORDER — SACUBITRIL-VALSARTAN 97-103 MG PO TABS: 1.0000 | ORAL_TABLET | Freq: Two times a day (BID) | ORAL | Status: DC

## 2022-07-13 MED ORDER — AMIODARONE HCL IN DEXTROSE 360-4.14 MG/200ML-% IV SOLN
60.0000 mg/h | INTRAVENOUS | Status: AC
  Administered 2022-07-14 (×2): 60 mg/h via INTRAVENOUS
  Filled 2022-07-13: qty 200

## 2022-07-13 MED ORDER — ONDANSETRON HCL 4 MG PO TABS: 4.0000 mg | ORAL_TABLET | Freq: Four times a day (QID) | ORAL | Status: DC | PRN

## 2022-07-13 MED ORDER — SACUBITRIL-VALSARTAN 97-103 MG PO TABS
0.5000 | ORAL_TABLET | Freq: Two times a day (BID) | ORAL | Status: DC
  Filled 2022-07-13 (×2): qty 0.5

## 2022-07-13 MED ORDER — AMIODARONE HCL IN DEXTROSE 360-4.14 MG/200ML-% IV SOLN
30.0000 mg/h | INTRAVENOUS | Status: DC
  Administered 2022-07-14 – 2022-07-15 (×4): 30 mg/h via INTRAVENOUS
  Filled 2022-07-13 (×5): qty 200

## 2022-07-13 MED ORDER — AMIODARONE LOAD VIA INFUSION
150.0000 mg | Freq: Once | INTRAVENOUS | Status: AC
  Administered 2022-07-14: 150 mg via INTRAVENOUS
  Filled 2022-07-13: qty 83.34

## 2022-07-13 MED ORDER — POLYETHYLENE GLYCOL 3350 17 G PO PACK: 17.0000 g | PACK | Freq: Every day | ORAL | Status: DC | PRN

## 2022-07-13 MED ORDER — ADULT MULTIVITAMIN W/MINERALS CH
1.0000 | ORAL_TABLET | Freq: Every day | ORAL | Status: DC
  Administered 2022-07-14 – 2022-07-19 (×6): 1 via ORAL
  Filled 2022-07-13 (×6): qty 1

## 2022-07-13 MED ORDER — CARVEDILOL 12.5 MG PO TABS: 25.0000 mg | ORAL_TABLET | Freq: Two times a day (BID) | ORAL | Status: DC

## 2022-07-13 MED ORDER — ONDANSETRON HCL 4 MG/2ML IJ SOLN: 4.0000 mg | Freq: Four times a day (QID) | INTRAMUSCULAR | Status: DC | PRN

## 2022-07-13 MED ORDER — SODIUM CHLORIDE 0.9 % IV BOLUS
500.0000 mL | Freq: Once | INTRAVENOUS | Status: AC
  Administered 2022-07-13: 500 mL via INTRAVENOUS

## 2022-07-13 MED ORDER — APIXABAN 5 MG PO TABS
5.0000 mg | ORAL_TABLET | Freq: Two times a day (BID) | ORAL | Status: DC
  Administered 2022-07-13 – 2022-07-14 (×2): 5 mg via ORAL
  Filled 2022-07-13 (×2): qty 1

## 2022-07-13 MED ORDER — DAPAGLIFLOZIN PROPANEDIOL 10 MG PO TABS: 10.0000 mg | ORAL_TABLET | Freq: Every day | ORAL | Status: DC

## 2022-07-13 MED ORDER — CARVEDILOL 12.5 MG PO TABS: 12.5000 mg | ORAL_TABLET | Freq: Two times a day (BID) | ORAL | Status: DC

## 2022-07-13 NOTE — Assessment & Plan Note (Addendum)
Clinically appears euvolemic.  BNP within normal limit. Recent echocardiogram done in July 2023 with EF of 45 to 50%. -Continue home carvedilol, Entresto and spironolactone. -Holding Entresto tonight due to borderline soft blood pressure

## 2022-07-13 NOTE — Assessment & Plan Note (Addendum)
Patient with concern of wide-complex paroxysmal tachycardia with complaints of palpitations.  Cardiology is consulted from ED. had loop recorder in place Spontaneously converted to sinus rhythm. -Admit to cardiac telemetry -Continue home carvedilol -Check TSH

## 2022-07-13 NOTE — ED Notes (Signed)
Cardiology at the bedside.

## 2022-07-13 NOTE — ED Notes (Signed)
Ordered labs recollected. Initial collection hemolyzed

## 2022-07-13 NOTE — Assessment & Plan Note (Signed)
-  Continuing home meds

## 2022-07-13 NOTE — Telephone Encounter (Signed)
Presenting Rhythm 07/12/22 at 11:30 AM, spoke with patient stated he had started exercising at the time, felt his heart rate go up and stopped. Episode appeared to have continued for at least 51minutes other tachy ECGs show same morphology.  Had patient send another transmission in to confirm no more tachy episodes other than episode that occurred yesterday. Patient did admit to missing Cardizem dose the night before and states he took cardizem right before tachy episode started.

## 2022-07-13 NOTE — H&P (Signed)
History and Physical    Patient: Bryan Guerrero TMH:[id number] DOB: Dec 01, 1970 DOA: 07/13/2022 DOS: the patient was seen and examined on 07/14/2022 PCP: Elsie Stain, MD  Patient coming from: Home  Chief Complaint:  Chief Complaint  Patient presents with   Abdominal Pain    Pain on and off yesterday and today   HPI: Bryan Guerrero is a 52 y.o. male with medical history significant of HFrEF, alcoholic cardiomyopathy, hypertension and prior history of alcohol abuse came to ED with complaint of palpitations and racing of heart.  Patient had a similar episode of palpitations with racing of heart yesterday while working out.  This morning he received a call from his cardiologist office that he should is off as there were some abnormal rhythm noted on his loop recorder.  Today while doing grocery he suddenly started palpitations and racing of heart.  He was feeling little dizzy, no syncope.  No chest pain, mild shortness of breath.  He decided to call EMS. EMS noted markedly elevated heart rate above 200 and EKG with wide-complex tachycardia.  Patient spontaneously converted back to sinus rhythm before reaching ED.  Patient denies any recent illness or sick contacts.  No fever or chills.  No recent change in appetite, bowel habits. Per patient he has decreased his alcohol use and smoking quite a bit.  Only uses couple of beer per week and 3 to 4 cigarettes/day.  No recent binge drinking. Patient denies any urinary symptoms  ED course.  Patient was found to have softer blood pressure at 81/71 on arrival to ED. received 500 cc of bolus which resulted in improvement of blood pressure.  Labs pertinent for creatinine of 1.79 with baseline around 1.2-1.3.  Troponin 47, BNP normal at 76.9.  Chest x-ray without any acute abnormality.  EKG.  Personally reviewed.  Normal sinus rhythm with frequent PVCs.  Cardiology was consulted from ED.  Review of Systems: As mentioned in the history of present  illness. All other systems reviewed and are negative. Past Medical History:  Diagnosis Date   Alcohol abuse    Asthma    Cardiomyopathy ([place])    Dyspnea    Hypertension    Past Surgical History:  Procedure Laterality Date   A-FLUTTER ABLATION N/A 11/23/2021   Procedure: A-FLUTTER ABLATION;  Surgeon: Vickie Epley, MD;  Location: [place] CV LAB;  Service: Cardiovascular;  Laterality: N/A;   CARDIOVERSION N/A 03/17/2020   Procedure: CARDIOVERSION;  Surgeon: Lelon Perla, MD;  Location: [facility] ENDOSCOPY;  Service: Cardiovascular;  Laterality: N/A;   CARDIOVERSION N/A 09/16/2021   Procedure: CARDIOVERSION;  Surgeon: Larey Dresser, MD;  Location: [facility] ENDOSCOPY;  Service: Cardiovascular;  Laterality: N/A;   RIGHT/LEFT HEART CATH AND CORONARY ANGIOGRAPHY N/A 03/19/2020   Procedure: RIGHT/LEFT HEART CATH AND CORONARY ANGIOGRAPHY;  Surgeon: Larey Dresser, MD;  Location: [place] CV LAB;  Service: Cardiovascular;  Laterality: N/A;   TEE WITHOUT CARDIOVERSION  10/052021   TEE WITHOUT CARDIOVERSION N/A 03/17/2020   Procedure: TRANSESOPHAGEAL ECHOCARDIOGRAM (TEE);  Surgeon: Lelon Perla, MD;  Location: [facility] ENDOSCOPY;  Service: Cardiovascular;  Laterality: N/A;   Social History:  reports that he has been smoking cigars. He has never used smokeless tobacco. He reports that he does not currently use alcohol. He reports that he does not currently use drugs after having used the following drugs: Marijuana.  No Known Allergies  Family History  Problem Relation Age of Onset   Hypertension Father  Cancer Neg Hx    Diabetes Neg Hx     Prior to Admission medications   Medication Sig Start Date End Date Taking? Authorizing Provider  acetaminophen (TYLENOL) 500 MG tablet Take 1,000 mg by mouth every 6 (six) hours as needed for mild pain.    [provider]  apixaban (ELIQUIS) 5 MG TABS tablet Take 1 tablet (5 mg total) by mouth 2 (two) times daily. 05/26/22   Vickie Epley, MD  carvedilol (COREG) 25 MG tablet Take 1 tablet (25 mg total) by mouth 2 (two) times daily. 04/21/21   Larey Dresser, MD  dapagliflozin propanediol (FARXIGA) 10 MG TABS tablet Take 1 tablet (10 mg total) by mouth daily. 10/13/21   Argentina Donovan, PA-C  ENTRESTO 97-103 MG Take 1 tablet by mouth twice daily 06/21/22   Larey Dresser, MD  furosemide (LASIX) 20 MG tablet Take 1 tablet (20 mg total) by mouth daily as needed. For > 3 lb weight gain in 24 hr or > 5 lb weight gain in 1 week 09/17/21 09/17/22  Consuelo Pandy, PA-C  Multiple Vitamin (MULTIVITAMIN WITH MINERALS) TABS tablet Take 1 tablet by mouth daily.    [provider]  spironolactone (ALDACTONE) 25 MG tablet Take 1 tablet by mouth once daily 05/23/22   Larey Dresser, MD    Physical Exam: Vitals:   07/13/22 2251 07/13/22 2300 07/14/22 0038 07/14/22 0040  BP:  106/85 106/77 102/77  Pulse:  77 79 73  Resp:  13 12 14   Temp: 98.4 F (36.9 C)     TempSrc: Oral     SpO2:  98% 98% 99%  Weight:      Height:        General: Vital signs reviewed.  Patient is well-developed and well-nourished, in no acute distress and cooperative with exam.  Head: Normocephalic and atraumatic. Eyes: EOMI, conjunctivae normal, no scleral icterus.  Neck: Supple, trachea midline, normal ROM, no JVD, masses, thyromegaly, or carotid bruit present.  Cardiovascular: RRR, S1 normal, S2 normal, no murmurs, gallops, or rubs. Pulmonary/Chest: Clear to auscultation bilaterally, no wheezes, rales, or rhonchi. Abdominal: Soft, non-tender, non-distended, BS +, Extremities: No lower extremity edema bilaterally,  pulses symmetric and intact bilaterally. No cyanosis or clubbing. Neurological: A&O x3, Strength is normal and symmetric bilaterally, cranial nerve II-XII are grossly intact, no focal motor deficit, sensory intact to light touch bilaterally.  Skin: Warm, dry and intact. No rashes or erythema. Psychiatric: Normal mood and affect.   Data  Reviewed: Prior data reviewed  Assessment and Plan: * Tachycardia, unspecified Patient with concern of wide-complex paroxysmal tachycardia with complaints of palpitations.  Cardiology is consulted from ED. had loop recorder in place Spontaneously converted to sinus rhythm. -Admit to cardiac telemetry -Continue home carvedilol -Check TSH  Chronic systolic congestive heart failure (HCC) Clinically appears euvolemic.  BNP within normal limit. Recent echocardiogram done in July 2023 with EF of 45 to 50%. -Continue home carvedilol, Entresto and spironolactone. -Holding Entresto tonight due to borderline soft blood pressure  Primary hypertension -Continuing home meds  Atrial flutter (HCC) Continue home carvedilol and Eliquis  AKI (acute kidney injury) ([place]) Creatinine at 1.79 with baseline seems to be around 1.2-1.3. Received some IV fluid in ED. -Monitor renal function -Avoid nephrotoxins    Advance Care Planning:   Code Status: Full Code   Consults: Cardiology  Family Communication: No family at bedside.  Severity of Illness: The appropriate patient status for this patient is OBSERVATION.  Observation status is judged to be reasonable and necessary in order to provide the required intensity of service to ensure the patient's safety. The patient's presenting symptoms, physical exam findings, and initial radiographic and laboratory data in the context of their medical condition is felt to place them at decreased risk for further clinical deterioration. Furthermore, it is anticipated that the patient will be medically stable for discharge from the hospital within 2 midnights of admission.   This record has been created using Systems analyst. Errors have been sought and corrected,but may not always be located. Such creation errors do not reflect on the standard of care.   Author: Lorella Nimrod, MD 07/14/2022 12:54 AM  For on call review [email or web address].

## 2022-07-13 NOTE — ED Triage Notes (Signed)
Arrived [facility] not feeling well yesterday with ABD pain, today C/O ABD, HR 230 VTAC on arrival,HTN, diaphoretic, self converted en route after 30 minutes, 100 ml fluid given en route. Loop recorder called yesterday to take it easy today.

## 2022-07-13 NOTE — Progress Notes (Signed)
Carelink Summary Report / Loop Recorder

## 2022-07-13 NOTE — Consult Note (Signed)
Cardiology Consultation   Patient ID: Bryan Guerrero MRN: [id number]; DOB: 1971-06-09  Admit date: 07/13/2022 Date of Consult: 07/13/2022  PCP:  Elsie Stain, MD   [place] Prov[facility] Cardiologist:  Sanda Klein, MD  Electrophysiologist:  Vickie Epley, MD  Advanced Heart Failure:  [name], MD  { Click here to update MD or APP on Care Team, Refresh:1}     Patient Profile:   CLEBERT Guerrero is a 52 y.o. male with a hx of atrial flutter s/p CTI, paroxysmal atrial fibrillation, HTN, HFrecEF, tobacco use disorder, alcohol use disorder who is being seen 07/13/2022 for the evaluation of *** at the request of Dr. Truett Mainland.  History of Present Illness:   Mr. Fennell ***   Past Medical History:  Diagnosis Date   Alcohol abuse    Asthma    Cardiomyopathy ([place])    Dyspnea    Hypertension     Past Surgical History:  Procedure Laterality Date   A-FLUTTER ABLATION N/A 11/23/2021   Procedure: A-FLUTTER ABLATION;  Surgeon: Vickie Epley, MD;  Location: [place] CV LAB;  Service: Cardiovascular;  Laterality: N/A;   CARDIOVERSION N/A 03/17/2020   Procedure: CARDIOVERSION;  Surgeon: Lelon Perla, MD;  Location: [facility] ENDOSCOPY;  Service: Cardiovascular;  Laterality: N/A;   CARDIOVERSION N/A 09/16/2021   Procedure: CARDIOVERSION;  Surgeon: Larey Dresser, MD;  Location: [facility] ENDOSCOPY;  Service: Cardiovascular;  Laterality: N/A;   RIGHT/LEFT HEART CATH AND CORONARY ANGIOGRAPHY N/A 03/19/2020   Procedure: RIGHT/LEFT HEART CATH AND CORONARY ANGIOGRAPHY;  Surgeon: Larey Dresser, MD;  Location: [place] CV LAB;  Service: Cardiovascular;  Laterality: N/A;   TEE WITHOUT CARDIOVERSION  10/052021   TEE WITHOUT CARDIOVERSION N/A 03/17/2020   Procedure: TRANSESOPHAGEAL ECHOCARDIOGRAM (TEE);  Surgeon: Lelon Perla, MD;  Location: [facility] ENDOSCOPY;  Service: Cardiovascular;  Laterality: N/A;     {Home Medications (Optional):21181}  Inpatient  Medications: Scheduled Meds:  Continuous Infusions:  sodium chloride     PRN Meds:   Allergies:   Not on File  Social History:   Social History   Socioeconomic History   Marital status: Widowed    Spouse name: Not on file   Number of children: Not on file   Years of education: Not on file   Highest education level: Not on file  Occupational History   Not on file  Tobacco Use   Smoking status: Some Days    Types: Cigars    Last attempt to quit: 07/14/2018    Years since quitting: 4.0   Smokeless tobacco: Never  Vaping Use   Vaping Use: Never used  Substance and Sexual Activity   Alcohol use: Not Currently    Comment: quit 03/09/20   Drug use: Not Currently    Types: Marijuana    Comment: quit 03/09/20   Sexual activity: Not Currently  Other Topics Concern   Not on file  Social History Narrative   Not on file   Social Determinants of Health   Financial Resource Strain: Not on file  Food Insecurity: No Food Insecurity (03/20/2020)   Hunger Vital Sign    Worried About Running Out of Food in the Last Year: Never true    Ran Out of Food in the Last Year: Never true  Transportation Needs: No Transportation Needs (03/20/2020)   PRAPARE - Hydrologist (Medical): No    Lack of Transportation (Non-Medical): No  Physical Activity: Not on file  Stress: Not on file  Social Connections: Not on file  Intimate Partner Violence: Not on file    Family History:   *** Family History  Problem Relation Age of Onset   Hypertension Father    Cancer Neg Hx    Diabetes Neg Hx      ROS:  Please see the history of present illness.  *** All other ROS reviewed and negative.     Physical Exam/Data:   Vitals:   07/13/22 1915 07/13/22 1920 07/13/22 1930 07/13/22 1955  BP:   (!) 85/74   Pulse:  87    Resp:  15 17   SpO2: 98% 96%  93%   No intake or output data in the 24 hours ending 07/13/22 2021    06/08/2022    4:30 PM 02/15/2022    9:16 AM  12/27/2021    3:44 PM  Last 3 Weights  Weight (lbs) 182 lb 188 lb 194 lb  Weight (kg) 82.555 kg 85.276 kg 87.998 kg     There is no height or weight on file to calculate BMI.  General:  Well nourished, well developed, in no acute distress*** HEENT: normal Neck: no JVD Vascular: No carotid bruits; Distal pulses 2+ bilaterally Cardiac:  normal S1, S2; RRR; no murmur *** Lungs:  clear to auscultation bilaterally, no wheezing, rhonchi or rales  Abd: soft, nontender, no hepatomegaly  Ext: no edema Musculoskeletal:  No deformities, BUE and BLE strength normal and equal Skin: warm and dry  Neuro:  CNs 2-12 intact, no focal abnormalities noted Psych:  Normal affect   EKG:  The EKG was personally reviewed and demonstrates:  *** Telemetry:  Telemetry was personally reviewed and demonstrates:  ***  Relevant CV Studies: ***  Laboratory Data:  High Sensitivity Troponin:  No results for input(s): "TROPONINIHS" in the last 720 hours.   ChemistryNo results for input(s): "NA", "K", "CL", "CO2", "GLUCOSE", "BUN", "CREATININE", "CALCIUM", "MG", "GFRNONAA", "GFRAA", "ANIONGAP" in the last 168 hours.  No results for input(s): "PROT", "ALBUMIN", "AST", "ALT", "ALKPHOS", "BILITOT" in the last 168 hours. Lipids No results for input(s): "CHOL", "TRIG", "HDL", "LABVLDL", "LDLCALC", "CHOLHDL" in the last 168 hours.  Hematology Recent Labs  Lab 07/13/22 1901  WBC 7.6  RBC 5.49  HGB 17.8*  HCT 51.5  MCV 93.8  MCH 32.4  MCHC 34.6  RDW 13.9  PLT 247   Thyroid No results for input(s): "TSH", "FREET4" in the last 168 hours.  BNPNo results for input(s): "BNP", "PROBNP" in the last 168 hours.  DDimer No results for input(s): "DDIMER" in the last 168 hours.   Radiology/Studies:  DG Chest Port 1 View  Result Date: 07/13/2022 CLINICAL DATA:  Shortness of breath. EXAM: PORTABLE CHEST 1 VIEW COMPARISON:  09/15/2021 FINDINGS: Borderline/mild cardiomegaly. Normal mediastinal contours. Presumed loop  recorder in the left chest wall. No pulmonary edema, focal airspace disease or pleural effusion. No pneumothorax. No acute osseous findings. IMPRESSION: Borderline/mild cardiomegaly. No acute pulmonary process. Electronically Signed   By: Keith Rake M.D.   On: 07/13/2022 19:14     Assessment and Plan:   ***   Risk Assessment/Risk Scores:  {Complete the following score calculators/questions to meet required metrics.  Press F2         :[id number]}   {Is the patient being seen for unstable angina, ACS, NSTEMI or STEMI?:[id number]} {Does this patient have CHF or CHF symptoms?      :[id number]} {Does this patient have ATRIAL FIBRILLATION?:[id number]}  {Are we signing off today?:[id number]}  For questions or updates, please contact [place] Please consult [email or web address] for contact info under    Signed, Loren Racer, MD  07/13/2022 8:21 PM

## 2022-07-13 NOTE — Assessment & Plan Note (Signed)
Continue home carvedilol and Eliquis

## 2022-07-13 NOTE — ED Provider Notes (Signed)
[place] EMERGENCY DEPARTMENT AT [facility] Provider Note  CSN: [id number] Arrival date & time: 07/13/22 1827  Chief Complaint(s) Abdominal Pain (Pain on and off yesterday and today)  HPI Bryan Guerrero is a 52 y.o. male with history of heart failure presenting to the emergency department with palpitations.  Patient reports that he yesterday had an episode of palpitations, chest pain, shortness of breath, lightheadedness which lasted about 20 minutes and resolved after resting.  He reports he is exercising at that time.  He reports that this morning he got a call that said his loop recorder had detected possible ventricular arrhythmia.  He then had a recurrent episode which began after he climbs 4 flights of stairs.  He reports that he had similar symptoms.  He did not lose consciousness.  He called paramedics, who found that he was in a wide-complex tachycardia with a rate of 230.  By the time he was connected to all EKG leads twelve-lead EKG was obtained his heart rate had returned to sinus rhythm.  He did not receive any medications.  He reports that otherwise he has been feeling well, no ongoing chest pain or shortness of breath.  He denies any similar history in the past.  He reports medication compliance.  No fevers or chills, nausea, vomiting, diarrhea.   Past Medical History Past Medical History:  Diagnosis Date   Alcohol abuse    Asthma    Cardiomyopathy (HCC)    Dyspnea    Hypertension    Patient Active Problem List   Diagnosis Date Noted   Tachycardia, unspecified 07/13/2022   Acute respiratory failure with hypoxia (HCC) 04/11/2022   Hypomagnesemia 04/11/2022   Pneumonia, unspecified organism 04/11/2022   Acute combined systolic and diastolic heart failure (HCC) 09/15/2021   Acute on chronic combined systolic and diastolic CHF (congestive heart failure) (HCC) 03/19/2020   CHF (congestive heart failure) (HCC) 03/16/2020   Alcohol abuse    Alcoholic  cardiomyopathy (HCC)    Tachycardia    Atrial flutter (HCC)    Primary hypertension    Chronic systolic congestive heart failure (HCC) 08/14/2018   SOB (shortness of breath) 08/04/2018   Home Medication(s) Prior to Admission medications   Medication Sig Start Date End Date Taking? Authorizing Provider  acetaminophen (TYLENOL) 500 MG tablet Take 1,000 mg by mouth every 6 (six) hours as needed for mild pain.    [provider]  apixaban (ELIQUIS) 5 MG TABS tablet Take 1 tablet (5 mg total) by mouth 2 (two) times daily. 05/26/22   Lanier Prude, MD  carvedilol (COREG) 25 MG tablet Take 1 tablet (25 mg total) by mouth 2 (two) times daily. 04/21/21   Laurey Morale, MD  dapagliflozin propanediol (FARXIGA) 10 MG TABS tablet Take 1 tablet (10 mg total) by mouth daily. 10/13/21   Anders Simmonds, PA-C  ENTRESTO 97-103 MG Take 1 tablet by mouth twice daily 06/21/22   Laurey Morale, MD  furosemide (LASIX) 20 MG tablet Take 1 tablet (20 mg total) by mouth daily as needed. For > 3 lb weight gain in 24 hr or > 5 lb weight gain in 1 week 09/17/21 09/17/22  Allayne Butcher, PA-C  Multiple Vitamin (MULTIVITAMIN WITH MINERALS) TABS tablet Take 1 tablet by mouth daily.    [provider]  spironolactone (ALDACTONE) 25 MG tablet Take 1 tablet by mouth once daily 05/23/22   Laurey Morale, MD  Past Surgical History Past Surgical History:  Procedure Laterality Date   A-FLUTTER ABLATION N/A 11/23/2021   Procedure: A-FLUTTER ABLATION;  Surgeon: Vickie Epley, MD;  Location: [place] [facility];  Service: Cardiovascular;  Laterality: N/A;   CARDIOVERSION N/A 03/17/2020   Procedure: CARDIOVERSION;  Surgeon: Lelon Perla, MD;  Location: [facility] ENDOSCOPY;  Service: Cardiovascular;  Laterality: N/A;   CARDIOVERSION N/A 09/16/2021   Procedure: CARDIOVERSION;   Surgeon: Larey Dresser, MD;  Location: [facility] ENDOSCOPY;  Service: Cardiovascular;  Laterality: N/A;   RIGHT/LEFT HEART CATH AND CORONARY ANGIOGRAPHY N/A 03/19/2020   Procedure: RIGHT/LEFT HEART CATH AND CORONARY ANGIOGRAPHY;  Surgeon: Larey Dresser, MD;  Location: [place] [facility];  Service: Cardiovascular;  Laterality: N/A;   TEE WITHOUT CARDIOVERSION  10/052021   TEE WITHOUT CARDIOVERSION N/A 03/17/2020   Procedure: TRANSESOPHAGEAL ECHOCARDIOGRAM (TEE);  Surgeon: Lelon Perla, MD;  Location: [facility] ENDOSCOPY;  Service: Cardiovascular;  Laterality: N/A;   Family History Family History  Problem Relation Age of Onset   Hypertension Father    Cancer Neg Hx    Diabetes Neg Hx     Social History Social History   Tobacco Use   Smoking status: Some Days    Types: Cigars    Last attempt to quit: 07/14/2018    Years since quitting: 4.0   Smokeless tobacco: Never  Vaping Use   Vaping Use: Never used  Substance Use Topics   Alcohol use: Not Currently    Comment: quit 03/09/20   Drug use: Not Currently    Types: Marijuana    Comment: quit 03/09/20   Allergies Patient has no allergy information on record.  Review of Systems Review of Systems  All other systems reviewed and are negative.   Physical Exam Vital Signs  I have reviewed the triage vital signs BP 107/76   Pulse 76   Resp 13   Ht 6' (1.829 m)   Wt 81.6 kg   SpO2 99%   BMI 24.41 kg/m  Physical Exam Vitals and nursing note reviewed.  Constitutional:      General: He is not in acute distress.    Appearance: Normal appearance.  HENT:     Mouth/Throat:     Mouth: Mucous membranes are moist.  Eyes:     Conjunctiva/sclera: Conjunctivae normal.  Cardiovascular:     Rate and Rhythm: Normal rate and regular rhythm.  Pulmonary:     Effort: Pulmonary effort is normal. No respiratory distress.     Breath sounds: Normal breath sounds.  Abdominal:     General: Abdomen is flat.     Palpations: Abdomen is soft.      Tenderness: There is no abdominal tenderness.  Musculoskeletal:     Right lower leg: No edema.     Left lower leg: No edema.  Skin:    General: Skin is warm and dry.     Capillary Refill: Capillary refill takes less than 2 seconds.  Neurological:     Mental Status: He is alert and oriented to person, place, and time. Mental status is at baseline.  Psychiatric:        Mood and Affect: Mood normal.        Behavior: Behavior normal.     ED Results and Treatments Labs (all labs ordered are listed, but only abnormal results are displayed) Labs Reviewed  CBC WITH DIFFERENTIAL/PLATELET - Abnormal; Notable for the following components:      Result Value   Hemoglobin 17.8 (*)  All other components within normal limits  PROTIME-INR - Abnormal; Notable for the following components:   Prothrombin Time 17.7 (*)    INR 1.5 (*)    All other components within normal limits  COMPREHENSIVE METABOLIC PANEL - Abnormal; Notable for the following components:   Creatinine, Ser 1.79 (*)    Total Protein 5.8 (*)    GFR, Estimated 46 (*)    All other components within normal limits  I-STAT CHEM 8, ED - Abnormal; Notable for the following components:   Creatinine, Ser 1.60 (*)    Hemoglobin 18.0 (*)    HCT 53.0 (*)    All other components within normal limits  TROPONIN I (HIGH SENSITIVITY) - Abnormal; Notable for the following components:   Troponin I (High Sensitivity) 47 (*)    All other components within normal limits  BRAIN NATRIURETIC PEPTIDE  MAGNESIUM  LACTIC ACID, PLASMA  LACTIC ACID, PLASMA  TSH  BASIC METABOLIC PANEL  CBC  COMPREHENSIVE METABOLIC PANEL  TROPONIN I (HIGH SENSITIVITY)                                                                                                                          Radiology DG Chest Port 1 View  Result Date: 07/13/2022 CLINICAL DATA:  Shortness of breath. EXAM: PORTABLE CHEST 1 VIEW COMPARISON:  09/15/2021 FINDINGS: Borderline/mild  cardiomegaly. Normal mediastinal contours. Presumed loop recorder in the left chest wall. No pulmonary edema, focal airspace disease or pleural effusion. No pneumothorax. No acute osseous findings. IMPRESSION: Borderline/mild cardiomegaly. No acute pulmonary process. Electronically Signed   By: Keith Rake M.D.   On: 07/13/2022 19:14    Pertinent labs & imaging results that were available during my care of the patient were reviewed by me and considered in my medical decision making (see MDM for details).  Medications Ordered in ED Medications  spironolactone (ALDACTONE) tablet 25 mg (has no administration in time range)  dapagliflozin propanediol (FARXIGA) tablet 10 mg (has no administration in time range)  acetaminophen (TYLENOL) tablet 650 mg (has no administration in time range)    Or  acetaminophen (TYLENOL) suppository 650 mg (has no administration in time range)  polyethylene glycol (MIRALAX / GLYCOLAX) packet 17 g (has no administration in time range)  ondansetron (ZOFRAN) tablet 4 mg (has no administration in time range)    Or  ondansetron (ZOFRAN) injection 4 mg (has no administration in time range)  sodium chloride 0.9 % bolus 500 mL (0 mLs Intravenous Stopped 07/13/22 2153)  Procedures Procedures  (including critical care time)  Medical Decision Making / ED Course   MDM:  52 year old male presenting to the emergency department with abnormal rhythm.  Reviewed EMS rhythm strip, shows wide-complex tachycardia with a rate of around 230.  Appears regular.  Loop recorder rhythm strip in chart shows wide-complex tachycardia.  Concern for possible ventricular tachycardia.  His symptoms have resolved after conversion to normal sinus rhythm.  Pads have been placed.  Discussed with cardiology who agrees with admission.  Will check electrolytes,  troponin, replete as needed.  Vital signs notable for mild hypotension.  Patient asymptomatic.  Will give small fluid bolus.  Patient appears euvolemic. Will continue on cardiac monitor  Clinical Course as of 07/13/22 2235  Wed Jul 13, 2022  2234 Admitted by hospitalist with cardiology consult. No further episodes of wide complex tachycardia in emergency department.  [WS]    Clinical Course User Index [WS] Lonell Grandchild, MD     Additional history obtained: -Additional history obtained from ems -External records from outside source obtained and reviewed including: Chart review including previous notes, labs, imaging, consultation notes including loop recorder note from earlier today   Lab Tests: -I ordered, reviewed, and interpreted labs.   The pertinent results include:   Labs Reviewed  CBC WITH DIFFERENTIAL/PLATELET - Abnormal; Notable for the following components:      Result Value   Hemoglobin 17.8 (*)    All other components within normal limits  PROTIME-INR - Abnormal; Notable for the following components:   Prothrombin Time 17.7 (*)    INR 1.5 (*)    All other components within normal limits  COMPREHENSIVE METABOLIC PANEL - Abnormal; Notable for the following components:   Creatinine, Ser 1.79 (*)    Total Protein 5.8 (*)    GFR, Estimated 46 (*)    All other components within normal limits  I-STAT CHEM 8, ED - Abnormal; Notable for the following components:   Creatinine, Ser 1.60 (*)    Hemoglobin 18.0 (*)    HCT 53.0 (*)    All other components within normal limits  TROPONIN I (HIGH SENSITIVITY) - Abnormal; Notable for the following components:   Troponin I (High Sensitivity) 47 (*)    All other components within normal limits  BRAIN NATRIURETIC PEPTIDE  MAGNESIUM  LACTIC ACID, PLASMA  LACTIC ACID, PLASMA  TSH  BASIC METABOLIC PANEL  CBC  COMPREHENSIVE METABOLIC PANEL  TROPONIN I (HIGH SENSITIVITY)    Notable for mild aki on CKD, troponin elevation  likely due to demand  EKG   EKG Interpretation  Date/Time:  Wednesday July 13 2022 18:56:27 EST Ventricular Rate:  91 PR Interval:  163 QRS Duration: 91 QT Interval:  368 QTC Calculation: 453 R Axis:   83 Text Interpretation: Sinus tachycardia Multiple ventricular premature complexes Biatrial enlargement Anterior infarct, old Confirmed by Alvino Blood ([id number]) on 07/13/2022 7:18:54 PM         Imaging Studies ordered: I ordered imaging studies including CXR On my interpretation imaging demonstrates no acute process I independently visualized and interpreted imaging. I agree with the radiologist interpretation   Medicines ordered and prescription drug management: Meds ordered this encounter  Medications   sodium chloride 0.9 % bolus 500 mL   spironolactone (ALDACTONE) tablet 25 mg   dapagliflozin propanediol (FARXIGA) tablet 10 mg   OR Linked Order Group    acetaminophen (TYLENOL) tablet 650 mg    acetaminophen (TYLENOL) suppository 650 mg   polyethylene glycol (MIRALAX /  GLYCOLAX) packet 17 g   OR Linked Order Group    ondansetron (ZOFRAN) tablet 4 mg    ondansetron (ZOFRAN) injection 4 mg    -I have reviewed the patients home medicines and have made adjustments as needed   Consultations Obtained: I requested consultation with the cardiology,  and discussed lab and imaging findings as well as pertinent plan - they recommend: admit to medicine for monitoring   Cardiac Monitoring: The patient was maintained on a cardiac monitor.  I personally viewed and interpreted the cardiac monitored which showed an underlying rhythm of: NSR  Social Determinants of Health:  Diagnosis or treatment significantly limited by social determinants of health: alcohol use   Reevaluation: After the interventions noted above, I reevaluated the patient and found that they have resolved  Co morbidities that complicate the patient evaluation  Past Medical History:  Diagnosis Date    Alcohol abuse    Asthma    Cardiomyopathy ([place])    Dyspnea    Hypertension       Dispostion: Disposition decision including need for hospitalization was considered, and patient admitted to the hospital.    Final Clinical Impression(s) / ED Diagnoses Final diagnoses:  Wide-complex tachycardia     This chart was dictated using voice recognition software.  Despite best efforts to proofread,  errors can occur which can change the documentation meaning.    Cristie Hem, MD 07/13/22 [phone]

## 2022-07-13 NOTE — ED Notes (Signed)
Lab notified this RN, last set of labs sent down hemolyzed. Labs recollected and sent down

## 2022-07-14 ENCOUNTER — Observation Stay (HOSPITAL_COMMUNITY): Payer: 59

## 2022-07-14 ENCOUNTER — Inpatient Hospital Stay (HOSPITAL_COMMUNITY): Payer: 59

## 2022-07-14 DIAGNOSIS — I5022 Chronic systolic (congestive) heart failure: Secondary | ICD-10-CM

## 2022-07-14 DIAGNOSIS — I4892 Unspecified atrial flutter: Secondary | ICD-10-CM | POA: Diagnosis present

## 2022-07-14 DIAGNOSIS — F17210 Nicotine dependence, cigarettes, uncomplicated: Secondary | ICD-10-CM | POA: Diagnosis present

## 2022-07-14 DIAGNOSIS — I472 Ventricular tachycardia, unspecified: Secondary | ICD-10-CM | POA: Diagnosis present

## 2022-07-14 DIAGNOSIS — E860 Dehydration: Secondary | ICD-10-CM | POA: Diagnosis present

## 2022-07-14 DIAGNOSIS — Z91148 Patient's other noncompliance with medication regimen for other reason: Secondary | ICD-10-CM | POA: Diagnosis not present

## 2022-07-14 DIAGNOSIS — N179 Acute kidney failure, unspecified: Secondary | ICD-10-CM | POA: Diagnosis present

## 2022-07-14 DIAGNOSIS — J45909 Unspecified asthma, uncomplicated: Secondary | ICD-10-CM | POA: Diagnosis present

## 2022-07-14 DIAGNOSIS — F17290 Nicotine dependence, other tobacco product, uncomplicated: Secondary | ICD-10-CM | POA: Diagnosis present

## 2022-07-14 DIAGNOSIS — I480 Paroxysmal atrial fibrillation: Secondary | ICD-10-CM | POA: Diagnosis present

## 2022-07-14 DIAGNOSIS — R000 Tachycardia, unspecified: Secondary | ICD-10-CM

## 2022-07-14 DIAGNOSIS — I10 Essential (primary) hypertension: Secondary | ICD-10-CM

## 2022-07-14 DIAGNOSIS — I428 Other cardiomyopathies: Secondary | ICD-10-CM | POA: Diagnosis present

## 2022-07-14 DIAGNOSIS — F1010 Alcohol abuse, uncomplicated: Secondary | ICD-10-CM | POA: Diagnosis present

## 2022-07-14 DIAGNOSIS — I493 Ventricular premature depolarization: Secondary | ICD-10-CM | POA: Diagnosis present

## 2022-07-14 DIAGNOSIS — I426 Alcoholic cardiomyopathy: Secondary | ICD-10-CM | POA: Diagnosis present

## 2022-07-14 DIAGNOSIS — I4729 Other ventricular tachycardia: Secondary | ICD-10-CM | POA: Diagnosis not present

## 2022-07-14 DIAGNOSIS — I110 Hypertensive heart disease with heart failure: Secondary | ICD-10-CM | POA: Diagnosis present

## 2022-07-14 DIAGNOSIS — Z8249 Family history of ischemic heart disease and other diseases of the circulatory system: Secondary | ICD-10-CM | POA: Diagnosis not present

## 2022-07-14 DIAGNOSIS — Z79899 Other long term (current) drug therapy: Secondary | ICD-10-CM | POA: Diagnosis not present

## 2022-07-14 DIAGNOSIS — Z7901 Long term (current) use of anticoagulants: Secondary | ICD-10-CM | POA: Diagnosis not present

## 2022-07-14 LAB — CBC
HCT: 41.8 % (ref 39.0–52.0)
Hemoglobin: 14.1 g/dL (ref 13.0–17.0)
MCH: 32.2 pg (ref 26.0–34.0)
MCHC: 33.7 g/dL (ref 30.0–36.0)
MCV: 95.4 fL (ref 80.0–100.0)
Platelets: 207 10*3/uL (ref 150–400)
RBC: 4.38 MIL/uL (ref 4.22–5.81)
RDW: 12.8 % (ref 11.5–15.5)
WBC: 7 10*3/uL (ref 4.0–10.5)
nRBC: 0 % (ref 0.0–0.2)

## 2022-07-14 LAB — ECHOCARDIOGRAM COMPLETE
Area-P 1/2: 4.57 cm2
Height: 72 in
S' Lateral: 5 cm
Single Plane A4C EF: 30 %
Weight: 2880 oz

## 2022-07-14 LAB — BASIC METABOLIC PANEL
Anion gap: 14 (ref 5–15)
BUN: 18 mg/dL (ref 6–20)
CO2: 19 mmol/L — ABNORMAL LOW (ref 22–32)
Calcium: 8.6 mg/dL — ABNORMAL LOW (ref 8.9–10.3)
Chloride: 103 mmol/L (ref 98–111)
Creatinine, Ser: 1.71 mg/dL — ABNORMAL HIGH (ref 0.61–1.24)
GFR, Estimated: 48 mL/min — ABNORMAL LOW (ref >60)
Glucose, Bld: 94 mg/dL (ref 70–99)
Potassium: 4.1 mmol/L (ref 3.5–5.1)
Sodium: 136 mmol/L (ref 135–145)

## 2022-07-14 LAB — CBG MONITORING, ED: Glucose-Capillary: 101 mg/dL — ABNORMAL HIGH (ref 70–99)

## 2022-07-14 LAB — MAGNESIUM: Magnesium: 1.9 mg/dL (ref 1.7–2.4)

## 2022-07-14 LAB — LACTIC ACID, PLASMA: Lactic Acid, Venous: 1.2 mmol/L (ref 0.5–1.9)

## 2022-07-14 MED ORDER — LACTATED RINGERS IV SOLN: INTRAVENOUS | Status: AC

## 2022-07-14 MED ORDER — CARVEDILOL 3.125 MG PO TABS: 3.1250 mg | ORAL_TABLET | Freq: Two times a day (BID) | ORAL | Status: DC

## 2022-07-14 MED ORDER — GADOBUTROL 1 MMOL/ML IV SOLN
10.0000 mL | Freq: Once | INTRAVENOUS | Status: AC | PRN
  Administered 2022-07-14: 10 mL via INTRAVENOUS

## 2022-07-14 MED ORDER — SPIRONOLACTONE 12.5 MG HALF TABLET
12.5000 mg | ORAL_TABLET | Freq: Every day | ORAL | Status: DC
  Administered 2022-07-15 – 2022-07-18 (×4): 12.5 mg via ORAL
  Filled 2022-07-14 (×4): qty 1

## 2022-07-14 MED ORDER — SACUBITRIL-VALSARTAN 24-26 MG PO TABS
1.0000 | ORAL_TABLET | Freq: Two times a day (BID) | ORAL | Status: DC
  Administered 2022-07-15 – 2022-07-18 (×8): 1 via ORAL
  Filled 2022-07-14 (×8): qty 1

## 2022-07-14 MED ORDER — HEPARIN (PORCINE) 25000 UT/250ML-% IV SOLN
1350.0000 [IU]/h | INTRAVENOUS | Status: AC
  Administered 2022-07-14: 1200 [IU]/h via INTRAVENOUS
  Administered 2022-07-15 – 2022-07-16 (×2): 1350 [IU]/h via INTRAVENOUS
  Filled 2022-07-14 (×4): qty 250

## 2022-07-14 MED ORDER — LORAZEPAM 0.5 MG PO TABS
0.5000 mg | ORAL_TABLET | Freq: Once | ORAL | Status: AC | PRN
  Administered 2022-07-14: 0.5 mg via ORAL
  Filled 2022-07-14: qty 1

## 2022-07-14 MED ORDER — CARVEDILOL 3.125 MG PO TABS
3.1250 mg | ORAL_TABLET | Freq: Two times a day (BID) | ORAL | Status: DC
  Administered 2022-07-15 – 2022-07-19 (×9): 3.125 mg via ORAL
  Filled 2022-07-14 (×10): qty 1

## 2022-07-14 MED ORDER — DAPAGLIFLOZIN PROPANEDIOL 10 MG PO TABS
10.0000 mg | ORAL_TABLET | Freq: Every day | ORAL | Status: DC
  Administered 2022-07-15 – 2022-07-19 (×5): 10 mg via ORAL
  Filled 2022-07-14 (×5): qty 1

## 2022-07-14 NOTE — ED Notes (Signed)
ED TO INPATIENT HANDOFF REPORT  ED Nurse Name and Phone #: Jayven Naill Rn [phone]  S Name/Age/Gender [name] 52 y.o. male Room/Bed: 046C/046C  Code Status   Code Status: Full Code  Home/SNF/Other Home Patient oriented to: self, place, time, and situation Is this baseline? Yes   Triage Complete: Triage complete  Chief Complaint Tachycardia, unspecified [R00.0]  Triage Note Arrived [facility] not feeling well yesterday with ABD pain, today C/O ABD, HR 230 VTAC on arrival,HTN, diaphoretic, self converted en route after 30 minutes, 100 ml fluid given en route. Loop recorder called yesterday to take it easy today.   Allergies No Known Allergies  Level of Care/Admitting Diagnosis ED Disposition     ED Disposition  Admit   Condition  --   Comment  Hospital Area: [facility] [[id number]]  Level of Care: Telemetry Cardiac [[id number]]  May admit patient to [facility] or [facility] if equivalent level of care is available:: Yes  Covid Evaluation: Asymptomatic - no recent exposure (last 10 days) testing not required  Diagnosis: Tachycardia, unspecified [811.0.ICD-9-CM]  Admitting Physician: Arnetha Courser [[id number]]  Attending Physician: Leroy Sea [id number]  Certification:: I certify this patient will need inpatient services for at least 2 midnights  Estimated Length of Stay: 2          B Medical/Surgery History Past Medical History:  Diagnosis Date   Alcohol abuse    Asthma    Cardiomyopathy (HCC)    Dyspnea    Hypertension    Past Surgical History:  Procedure Laterality Date   A-FLUTTER ABLATION N/A 11/23/2021   Procedure: A-FLUTTER ABLATION;  Surgeon: Lanier Prude, MD;  Location: [facility] INVASIVE CV LAB;  Service: Cardiovascular;  Laterality: N/A;   CARDIOVERSION N/A 03/17/2020   Procedure: CARDIOVERSION;  Surgeon: Lewayne Bunting, MD;  Location: [facility] ENDOSCOPY;  Service: Cardiovascular;  Laterality: N/A;   CARDIOVERSION N/A 09/16/2021   Procedure:  CARDIOVERSION;  Surgeon: Laurey Morale, MD;  Location: [facility] ENDOSCOPY;  Service: Cardiovascular;  Laterality: N/A;   RIGHT/LEFT HEART CATH AND CORONARY ANGIOGRAPHY N/A 03/19/2020   Procedure: RIGHT/LEFT HEART CATH AND CORONARY ANGIOGRAPHY;  Surgeon: Laurey Morale, MD;  Location: [facility] INVASIVE CV LAB;  Service: Cardiovascular;  Laterality: N/A;   TEE WITHOUT CARDIOVERSION  10/052021   TEE WITHOUT CARDIOVERSION N/A 03/17/2020   Procedure: TRANSESOPHAGEAL ECHOCARDIOGRAM (TEE);  Surgeon: Lewayne Bunting, MD;  Location: [facility] ENDOSCOPY;  Service: Cardiovascular;  Laterality: N/A;     A IV Location/Drains/Wounds Patient Lines/Drains/Airways Status     Active Line/Drains/Airways     Name Placement date Placement time Site Days   Peripheral IV 07/13/22 22 G Right Antecubital 07/13/22  --  Antecubital  1   Peripheral IV 07/13/22 20 G Anterior;Left;Upper Arm 07/13/22  1850  Arm  1            Intake/Output Last 24 hours  Intake/Output Summary (Last 24 hours) at 07/14/2022 1126 Last data filed at 07/14/2022 3086 Gross per 24 hour  Intake 742 ml  Output --  Net 742 ml    Labs/Imaging Results for orders placed or performed during the hospital encounter of 07/13/22 (from the past 48 hour(s))  Brain natriuretic peptide     Status: None   Collection Time: 07/13/22  7:01 PM  Result Value Ref Range   B Natriuretic Peptide 76.9 0.0 - 100.0 pg/mL    Comment: Performed at [facility], [address], [place], [place] [address]  CBC  with Differential     Status: Abnormal   Collection Time: 07/13/22  7:01 PM  Result Value Ref Range   WBC 7.6 4.0 - 10.5 K/uL   RBC 5.49 4.22 - 5.81 MIL/uL   Hemoglobin 17.8 (H) 13.0 - 17.0 g/dL   HCT 09.3 81.8 - 29.9 %   MCV 93.8 80.0 - 100.0 fL   MCH 32.4 26.0 - 34.0 pg   MCHC 34.6 30.0 - 36.0 g/dL   RDW 37.1 69.6 - 78.9 %   Platelets 247 150 - 400 K/uL   nRBC 0.0 0.0 - 0.2 %   Neutrophils Relative % 75 %   Neutro Abs 5.8 1.7 - 7.7 K/uL   Lymphocytes  Relative 17 %   Lymphs Abs 1.3 0.7 - 4.0 K/uL   Monocytes Relative 6 %   Monocytes Absolute 0.5 0.1 - 1.0 K/uL   Eosinophils Relative 0 %   Eosinophils Absolute 0.0 0.0 - 0.5 K/uL   Basophils Relative 1 %   Basophils Absolute 0.0 0.0 - 0.1 K/uL   Immature Granulocytes 1 %   Abs Immature Granulocytes 0.04 0.00 - 0.07 K/uL    Comment: Performed at [facility], [address], [place], [place] [address]  Protime-INR     Status: Abnormal   Collection Time: 07/13/22  8:22 PM  Result Value Ref Range   Prothrombin Time 17.7 (H) 11.4 - 15.2 seconds   INR 1.5 (H) 0.8 - 1.2    Comment: (NOTE) INR goal varies based on device and disease states. Performed at [facility], [address], [place], [place] [address]   Comprehensive metabolic panel     Status: Abnormal   Collection Time: 07/13/22  8:22 PM  Result Value Ref Range   Sodium 137 135 - 145 mmol/L   Potassium  3.5 - 5.1 mmol/L    SPECIMEN HEMOLYZED. HEMOLYSIS MAY AFFECT INTEGRITY OF RESULTS.    Comment: RELEASING RESULTS UNAFFECTED BY HEMOLYSIS PER TIFFANY YOUNG RN 07/13/22 2116 M KOROLESKI   Chloride 103 98 - 111 mmol/L   CO2 23 22 - 32 mmol/L   Glucose, Bld 99 70 - 99 mg/dL    Comment: Glucose reference range applies only to samples taken after fasting for at least 8 hours.   BUN 15 6 - 20 mg/dL   Creatinine, Ser 5.85 (H) 0.61 - 1.24 mg/dL   Calcium 9.2 8.9 - 27.7 mg/dL   Total Protein 5.8 (L) 6.5 - 8.1 g/dL   Albumin 3.6 3.5 - 5.0 g/dL   AST  15 - 41 U/L    SPECIMEN HEMOLYZED. HEMOLYSIS MAY AFFECT INTEGRITY OF RESULTS.    Comment: RELEASING RESULTS UNAFFECTED BY HEMOLYSIS PER TIFFANY YOUNG RN 07/13/22 2116 M KOROLESKI   ALT  0 - 44 U/L    SPECIMEN HEMOLYZED. HEMOLYSIS MAY AFFECT INTEGRITY OF RESULTS.    Comment: RELEASING RESULTS UNAFFECTED BY HEMOLYSIS PER TIFFANY YOUNG RN 07/13/22 2116 M KOROLESKI   Alkaline Phosphatase  38 - 126 U/L    SPECIMEN HEMOLYZED. HEMOLYSIS MAY AFFECT INTEGRITY OF RESULTS.     Comment: RELEASING RESULTS UNAFFECTED BY HEMOLYSIS PER TIFFANY YOUNG RN 07/13/22 2116 M KOROLESKI   Total Bilirubin  0.3 - 1.2 mg/dL    SPECIMEN HEMOLYZED. HEMOLYSIS MAY AFFECT INTEGRITY OF RESULTS.    Comment: RELEASING RESULTS UNAFFECTED BY HEMOLYSIS PER TIFFANY YOUNG RN 07/13/22 2116 M KOROLESKI   GFR, Estimated 46 (L) >60 mL/min    Comment: (NOTE) Calculated using the CKD-EPI Creatinine Equation (  2021)    Anion gap 11 5 - 15    Comment: Performed at [facility], [place] [address] [place], [place] [address]  Magnesium     Status: None   Collection Time: 07/13/22  8:22 PM  Result Value Ref Range   Magnesium  1.7 - 2.4 mg/dL    SPECIMEN HEMOLYZED. HEMOLYSIS MAY AFFECT INTEGRITY OF RESULTS.    Comment: RELEASING RESULTS UNAFFECTED BY HEMOLYSIS PER TIFFANY YOUNG RN 07/13/22 2116 Wiliam Ke Performed at [facility], [place] [address] [place], [place] [address]   Troponin I (High Sensitivity)     Status: Abnormal   Collection Time: 07/13/22  8:22 PM  Result Value Ref Range   Troponin I (High Sensitivity) 47 (H) <18 ng/L    Comment: (NOTE) Elevated high sensitivity troponin I (hsTnI) values and significant  changes across serial measurements may suggest ACS but many other  chronic and acute conditions are known to elevate hsTnI results.  Refer to the Links section for chest pain algorithms and additional  guidance. Performed at [facility], [place] [address] [place], [place] [address]   Troponin I (High Sensitivity)     Status: Abnormal   Collection Time: 07/13/22  9:35 PM  Result Value Ref Range   Troponin I (High Sensitivity) 60 (H) <18 ng/L    Comment: (NOTE) Elevated high sensitivity troponin I (hsTnI) values and significant  changes across serial measurements may suggest ACS but many other  chronic and acute conditions are known to elevate hsTnI results.  Refer to the "Links" section for chest pain algorithms and additional  guidance. Performed at [facility], [place] [address], [place], [place] [address]   Lactic acid, plasma     Status: None   Collection Time: 07/13/22  9:35 PM  Result Value Ref Range   Lactic Acid, Venous 1.9 0.5 - 1.9 mmol/L    Comment: Performed at [place] [address], [place], [place] [address]  TSH     Status: None   Collection Time: 07/13/22  9:35 PM  Result Value Ref Range   TSH 1.317 0.350 - 4.500 uIU/mL    Comment: Performed by a 3rd Generation assay with a functional sensitivity of <=0.01 uIU/mL. Performed at [facility], [place] [address], [place], [place] [address]   Comprehensive metabolic panel     Status: Abnormal   Collection Time: 07/13/22  9:35 PM  Result Value Ref Range   Sodium 138 135 - 145 mmol/L   Potassium 4.0 3.5 - 5.1 mmol/L   Chloride 108 98 - 111 mmol/L   CO2 21 (L) 22 - 32 mmol/L   Glucose, Bld 79 70 - 99 mg/dL    Comment: Glucose reference range applies only to samples taken after fasting for at least 8 hours.   BUN 14 6 - 20 mg/dL   Creatinine, Ser 1.68 (H) 0.61 - 1.24 mg/dL   Calcium 9.0 8.9 - 10.3 mg/dL   Total Protein 6.1 (L) 6.5 - 8.1 g/dL   Albumin 3.4 (L) 3.5 - 5.0 g/dL   AST 28 15 - 41 U/L   ALT 16 0 - 44 U/L   Alkaline Phosphatase 54 38 - 126 U/L   Total Bilirubin 1.5 (H) 0.3 - 1.2 mg/dL   GFR, Estimated 50 (L) >60 mL/min    Comment: (NOTE) Calculated using the CKD-EPI Creatinine Equation (2021)    Anion gap 9 5 - 15  Comment: Performed at [facility], [address] [place], [place] [address]  I-stat chem 8, ED     Status: Abnormal   Collection Time: 07/13/22  9:57 PM  Result Value Ref Range   Sodium 142 135 - 145 mmol/L   Potassium 4.0 3.5 - 5.1 mmol/L   Chloride 104 98 - 111 mmol/L   BUN 18 6 - 20 mg/dL   Creatinine, Ser 6.21 (H) 0.61 - 1.24 mg/dL   Glucose, Bld 75 70 - 99 mg/dL    Comment: Glucose reference range applies only to samples taken after fasting for at least 8 hours.   Calcium, Ion 1.18 1.15 - 1.40 mmol/L   TCO2 26  22 - 32 mmol/L   Hemoglobin 18.0 (H) 13.0 - 17.0 g/dL   HCT 30.8 (H) 65.7 - 84.6 %  Lactic acid, plasma     Status: None   Collection Time: 07/13/22 11:34 PM  Result Value Ref Range   Lactic Acid, Venous 1.2 0.5 - 1.9 mmol/L    Comment: Performed at [facility], [address], [place], [place] [address]  Basic metabolic panel     Status: Abnormal   Collection Time: 07/14/22  4:16 AM  Result Value Ref Range   Sodium 136 135 - 145 mmol/L   Potassium 4.1 3.5 - 5.1 mmol/L    Comment: HEMOLYSIS AT THIS LEVEL MAY AFFECT RESULT   Chloride 103 98 - 111 mmol/L   CO2 19 (L) 22 - 32 mmol/L   Glucose, Bld 94 70 - 99 mg/dL    Comment: Glucose reference range applies only to samples taken after fasting for at least 8 hours.   BUN 18 6 - 20 mg/dL   Creatinine, Ser 2.84 (H) 0.61 - 1.24 mg/dL   Calcium 8.6 (L) 8.9 - 10.3 mg/dL   GFR, Estimated 48 (L) >60 mL/min    Comment: (NOTE) Calculated using the CKD-EPI Creatinine Equation (2021)    Anion gap 14 5 - 15    Comment: Performed at [facility], [address] [place], [place] [address]  CBC     Status: None   Collection Time: 07/14/22  4:16 AM  Result Value Ref Range   WBC 7.0 4.0 - 10.5 K/uL   RBC 4.38 4.22 - 5.81 MIL/uL   Hemoglobin 14.1 13.0 - 17.0 g/dL   HCT 01.0 27.2 - 53.6 %   MCV 95.4 80.0 - 100.0 fL   MCH 32.2 26.0 - 34.0 pg   MCHC 33.7 30.0 - 36.0 g/dL   RDW 64.4 03.4 - 74.2 %   Platelets 207 150 - 400 K/uL   nRBC 0.0 0.0 - 0.2 %    Comment: Performed at [facility], [address], [place], [place] [address]  Magnesium     Status: None   Collection Time: 07/14/22  4:16 AM  Result Value Ref Range   Magnesium 1.9 1.7 - 2.4 mg/dL    Comment: Performed at [facility], [address], [place], [place] [address]  CBG monitoring, ED     Status: Abnormal   Collection Time: 07/14/22  8:15 AM  Result Value Ref Range   Glucose-Capillary 101 (H) 70 - 99 mg/dL    Comment: Glucose reference range applies  only to samples taken after fasting for at least 8 hours.   DG Chest Port 1 View  Result Date: 07/13/2022 CLINICAL DATA:  Shortness of breath. EXAM: PORTABLE CHEST 1 VIEW COMPARISON:  09/15/2021 FINDINGS: Borderline/mild cardiomegaly. Normal mediastinal contours. Presumed loop recorder in the left chest wall. No pulmonary edema, focal airspace disease or pleural effusion. No pneumothorax. No acute osseous findings. IMPRESSION: Borderline/mild cardiomegaly. No acute pulmonary process. Electronically Signed   By: Keith Rake M.D.   On: 07/13/2022 19:14    Pending Labs Unresulted Labs (From admission, onward)     Start     Ordered   07/16/22 0500  APTT  Daily,   R      07/14/22 1056   07/15/22 0600  APTT  Once-Timed,   R        07/14/22 1056   07/15/22 0500  CBC with Differential/Platelet  Daily,   R     Question:  Specimen collection method  Answer:  Lab=Lab collect   07/14/22 0542   07/15/22 0500  Brain natriuretic peptide  Daily,   R     Question:  Specimen collection method  Answer:  Lab=Lab collect   07/14/22 0542   07/15/22 1062  Basic metabolic panel  Daily,   R     Question:  Specimen collection method  Answer:  Lab=Lab collect   07/14/22 0542   07/15/22 0500  Magnesium  Daily,   R     Question:  Specimen collection method  Answer:  Lab=Lab collect   07/14/22 0542            Vitals/Pain Today's Vitals   07/14/22 0600 07/14/22 0630 07/14/22 0649 07/14/22 0730  BP: 120/77 114/85  108/85  Pulse: 64 62  (!) 57  Resp: 14 13  12   Temp:   98.6 F (37 C)   TempSrc:   Oral   SpO2: 97% 93%  94%  Weight:      Height:      PainSc:        Isolation Precautions No active isolations  Medications Medications  acetaminophen (TYLENOL) tablet 650 mg (has no administration in time range)    Or  acetaminophen (TYLENOL) suppository 650 mg (has no administration in time range)  polyethylene glycol (MIRALAX / GLYCOLAX) packet 17 g (has no administration in time range)   ondansetron (ZOFRAN) tablet 4 mg (has no administration in time range)    Or  ondansetron (ZOFRAN) injection 4 mg (has no administration in time range)  multivitamin with minerals tablet 1 tablet (1 tablet Oral Given 07/14/22 0819)  amiodarone (NEXTERONE) 1.8 mg/mL load via infusion 150 mg (150 mg Intravenous Bolus from Bag 07/14/22 0119)    Followed by  amiodarone (NEXTERONE PREMIX) 360-4.14 MG/200ML-% (1.8 mg/mL) IV infusion (0 mg/hr Intravenous Stopped 07/14/22 6948)    Followed by  amiodarone (NEXTERONE PREMIX) 360-4.14 MG/200ML-% (1.8 mg/mL) IV infusion (30 mg/hr Intravenous New Bag/Given 07/14/22 5462)  spironolactone (ALDACTONE) tablet 12.5 mg (has no administration in time range)  dapagliflozin propanediol (FARXIGA) tablet 10 mg (has no administration in time range)  sacubitril-valsartan (ENTRESTO) 24-26 mg per tablet (has no administration in time range)  carvedilol (COREG) tablet 3.125 mg (has no administration in time range)  lactated ringers infusion ( Intravenous New Bag/Given 07/14/22 0822)  LORazepam (ATIVAN) tablet 0.5 mg (has no administration in time range)  heparin ADULT infusion 100 units/mL (25000 units/25mL) (has no administration in time range)  sodium chloride 0.9 % bolus 500 mL (0 mLs Intravenous Stopped 07/13/22 2153)    Mobility walks with person assist     Focused Assessments Cardiac Assessment Handoff:  Cardiac Rhythm: Normal sinus rhythm Lab Results  Component Value Date  TROPONINI 0.03 (HH) 08/05/2018   No results found for: "DDIMER" Does the Patient currently have chest pain? No   , Pulmonary Assessment Handoff:  Lung sounds:          R Recommendations: See Admitting Provider Note  Report given to:   Additional Notes:

## 2022-07-14 NOTE — Assessment & Plan Note (Signed)
Creatinine at 1.79 with baseline seems to be around 1.2-1.3. Received some IV fluid in ED. -Monitor renal function -Avoid nephrotoxins

## 2022-07-14 NOTE — Progress Notes (Addendum)
ANTICOAGULATION CONSULT NOTE - Initial Consult  Pharmacy Consult for Eliquis > Heparin Indication: atrial fibrillation  No Known Allergies  Patient Measurements: Height: 6' (182.9 cm) Weight: 81.6 kg (180 lb) IBW/kg (Calculated) : 77.6 Heparin Dosing Weight: 81.6  Vital Signs: Temp: 98.6 F (37 C) (02/01 0649) Temp Source: Oral (02/01 0649) BP: 108/85 (02/01 0730) Pulse Rate: 57 (02/01 0730)  Labs: Recent Labs    07/13/22 1901 07/13/22 2022 07/13/22 2022 07/13/22 2135 07/13/22 2157 07/14/22 0416  HGB 17.8*  --   --   --  18.0* 14.1  HCT 51.5  --   --   --  53.0* 41.8  PLT 247  --   --   --   --  207  LABPROT  --  17.7*  --   --   --   --   INR  --  1.5*  --   --   --   --   CREATININE  --  1.79*   < > 1.68* 1.60* 1.71*  TROPONINIHS  --  47*  --  60*  --   --    < > = values in this interval not displayed.    Estimated Creatinine Clearance: 57.4 mL/min (A) (by C-G formula based on SCr of 1.71 mg/dL (H)).   Medical History: Past Medical History:  Diagnosis Date   Alcohol abuse    Asthma    Cardiomyopathy ([place])    Dyspnea    Hypertension     Assessment: 32 YOM with atrial fibrillation on Eliquis PTA. Pharmacy consulted to start heparin, holding Eliquis for possible ICD placement. Last dose Eliquis 2/1 AM at 0819. Hgb 14.1 and platelets 207 wnl. Will begin heparin infusion 2/1 at 2200 when next dose of Eliquis would have been due.   Goal of Therapy:  Heparin level 0.3-0.7 units/ml aPTT 66-102 seconds Monitor platelets by anticoagulation protocol: Yes   Plan:  No bolus Start heparin at 1,200 units/hr on 2/1 at 2200  Check 8 hour aPTT Daily aPTT and CBC Monitor s/sx bleeding  Eliseo Gum, PharmD PGY1 Pharmacy Resident   07/14/2022  10:46 AM

## 2022-07-14 NOTE — Progress Notes (Signed)
  Echocardiogram 2D Echocardiogram has been performed.  Bryan Guerrero 07/14/2022, 9:16 AM

## 2022-07-14 NOTE — Consult Note (Signed)
ELECTROPHYSIOLOGY CONSULT NOTE    Patient ID: Bryan Guerrero MRN: [id number], DOB/AGE: [birth year] 52 y.o.  Admit date: 07/13/2022 Date of Consult: 07/14/2022  Primary Physician: Elsie Stain, MD Primary Cardiologist: Sanda Klein, MD  Electrophysiologist: Dr. Quentin Ore   Referring Provider: Dr. Candiss Norse  Patient Profile: Bryan Guerrero is a 52 y.o. male with a history of HFrEF, paroxysmal AF, AFL s/p CTI, ETOH cardiomyopathy, HTN, and prior history of ETOH abuse who is being seen today for the evaluation of wide-complex tachycardia at the request of Dr. Candiss Norse.  HPI:  Bryan Guerrero is a 52 y.o. male with medical history as above.   Pt followed by Dr. Quentin Ore. Last seen 06/08/2022. He does have a ILR in place which previously showed AF. Taking Eliquis.   Device clinic received alert 07/13/2022 of HVR, with presenting WCT. Pt stated he was exercising at the time. By device, episode appeared to have lasted approx 20 minutes with other tachy ECGs showing the same morphology.   Pt admitted to missing a dose of cardizem.  Pt continued to have palpitations and called EMS. He was found to have a HR of 240 bpm (see below), which converted to around 90 bpm as he was transferred into the EMS truck, his symptoms also improved.   Pt repeats history above. Currently asymptomatic at rest. Otherwise, no new symptoms. He states he has had occasional med non-compliance (I.e missing a dose) but typically takes his medicines. He denies liquor, but states he occasionally does still have beer.  Labs Potassium4.1 (02/01 0416) Magnesium  1.9 (02/01 0416) Creatinine, ser  1.71* (02/01 0416) PLT  207 (02/01 0416) HGB  14.1 (02/01 0416) WBC 7.0 (02/01 0416) Troponin I (High Sensitivity)60* (01/31 2135).    Past Medical History:  Diagnosis Date   Alcohol abuse    Asthma    Cardiomyopathy ([place])    Dyspnea    Hypertension      Surgical History:  Past Surgical History:  Procedure Laterality  Date   A-FLUTTER ABLATION N/A 11/23/2021   Procedure: A-FLUTTER ABLATION;  Surgeon: Vickie Epley, MD;  Location: [place] CV LAB;  Service: Cardiovascular;  Laterality: N/A;   CARDIOVERSION N/A 03/17/2020   Procedure: CARDIOVERSION;  Surgeon: Lelon Perla, MD;  Location: [facility] ENDOSCOPY;  Service: Cardiovascular;  Laterality: N/A;   CARDIOVERSION N/A 09/16/2021   Procedure: CARDIOVERSION;  Surgeon: Larey Dresser, MD;  Location: [facility] ENDOSCOPY;  Service: Cardiovascular;  Laterality: N/A;   RIGHT/LEFT HEART CATH AND CORONARY ANGIOGRAPHY N/A 03/19/2020   Procedure: RIGHT/LEFT HEART CATH AND CORONARY ANGIOGRAPHY;  Surgeon: Larey Dresser, MD;  Location: [place] CV LAB;  Service: Cardiovascular;  Laterality: N/A;   TEE WITHOUT CARDIOVERSION  10/052021   TEE WITHOUT CARDIOVERSION N/A 03/17/2020   Procedure: TRANSESOPHAGEAL ECHOCARDIOGRAM (TEE);  Surgeon: Lelon Perla, MD;  Location: [facility] ENDOSCOPY;  Service: Cardiovascular;  Laterality: N/A;     (Not in a hospital admission)   Inpatient Medications:   apixaban  5 mg Oral BID   carvedilol  3.125 mg Oral BID   [START ON 07/15/2022] dapagliflozin propanediol  10 mg Oral Daily   multivitamin with minerals  1 tablet Oral Daily   [START ON 07/15/2022] sacubitril-valsartan  1 tablet Oral BID   [START ON 07/15/2022] spironolactone  12.5 mg Oral Daily    Allergies: No Known Allergies  Family History  Problem Relation Age of Onset   Hypertension Father    Cancer Neg Hx    Diabetes  Neg Hx      Physical Exam: Vitals:   07/14/22 0600 07/14/22 0630 07/14/22 0649 07/14/22 0730  BP: 120/77 114/85  108/85  Pulse: 64 62  (!) 57  Resp: 14 13  12   Temp:   98.6 F (37 C)   TempSrc:   Oral   SpO2: 97% 93%  94%  Weight:      Height:        GEN- NAD, A&O x 3, normal affect HEENT: Normocephalic, atraumatic Lungs- CTAB, Normal effort.  Heart- Regular rate and rhythm, No M/G/R.  GI- Soft, NT, ND.  Extremities- No clubbing, cyanosis, or  edema   Radiology/Studies: DG Chest Port 1 View  Result Date: 07/13/2022 CLINICAL DATA:  Shortness of breath. EXAM: PORTABLE CHEST 1 VIEW COMPARISON:  09/15/2021 FINDINGS: Borderline/mild cardiomegaly. Normal mediastinal contours. Presumed loop recorder in the left chest wall. No pulmonary edema, focal airspace disease or pleural effusion. No pneumothorax. No acute osseous findings. IMPRESSION: Borderline/mild cardiomegaly. No acute pulmonary process. Electronically Signed   By: Keith Rake M.D.   On: 07/13/2022 19:14   CUP PACEART REMOTE DEVICE CHECK  Result Date: 06/23/2022 ILR summary report received. Battery status OK. Normal device function. No new symptom,  brady, or pause episodes. No new 13 AF episodes, currently ongoing, histogram peak 100-120, burden 7%, Eliquis.  13 tachy event, longest duration 59min 17sec, AF with  rapid response.  Monthly summary reports and ROV/PRN LA   EKG: EKG on arrival to [facility] showed NSR in 90s with occasional PVCs (personally reviewed)  EMS EKG reviewed which showed WCT at approx 228 bpm   TELEMETRY: NSR / sinus brady 50-60s (personally reviewed)  DEVICE HISTORY: [employer] ILR implanted 02/2022 to monitor AF burden s/p AFL ablation  Assessment/Plan: 1.  WCT -> Likely VT He has been started on amiodarone with concerns for VT given his cardiomyopathy and scar on cMRI Continue BB Updated echo pending Potassium4.1 (02/01 0416) Magnesium  1.9 (02/01 0416) Keep K > 4.0 and Mg > 2.0  With scar on prior cMRI, will repeat to see if scar has progressed, as data will be limited after ICD. Tentatively, would plan ICD implant, more likely next week pending work up.  2. Paroxysmal AF 3. 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  Will hold for procedures. Start heparin, will need to hold prior to midnight of procedure date.  4. Chronic systolic CHF ? Non-impaction vs ETOH induced, though ETOH less likely to cause scar on cmRI Echo pending this  am. (EF 45-50% 12/2021) Continue coreg, farxiga, spiro, and entresto. EF has previously been as low as 13% on cMRI.  Update cMRI as well given potential plans for ICD.   For questions or updates, please contact [employer] Please consult [email or web address] for contact info under Cardiology/STEMI.  Jacalyn Lefevre, PA-C  07/14/2022 9:20 AM

## 2022-07-14 NOTE — Progress Notes (Signed)
PROGRESS NOTE                                                                                                                                                                                                             Patient Demographics:    Bryan Guerrero, is a 52 y.o. male, DOB - 08/12/1970, KGU:[id number]  Outpatient Primary MD for the patient is Elsie Stain, MD    LOS - 0  Admit date - 07/13/2022    Chief Complaint  Patient presents with   Abdominal Pain    Pain on and off yesterday and today       Brief Narrative (HPI from H&P)    52 y.o. male with medical history significant of HFrEF, alcoholic cardiomyopathy, hypertension and prior history of alcohol abuse came to ED with complaint of palpitations and racing of heart, had a recent loop recorder placed few weeks ago, in the ER workup suggestive of wide-complex tachycardia along with AKI seen by cardiology and admitted to the hospital for further care.   Subjective:    Bryan Guerrero today has, No headache, No chest pain, No abdominal pain - No Nausea, No new weakness tingling or numbness, no SOB   Assessment  & Plan :    Nonspecific wide-complex tachycardia.  A-fib with aberrancy versus V. tach.  Seen by cardiology.  Has a loop recorder which was placed recently, cardiology to evaluate further for now continue amiodarone drip, low-dose Coreg as tolerated by blood pressure and monitor on telemetry.  Recent echo shows EF is improved to around 45%.  New echo pending.   Chronic systolic congestive heart failure (HCC) EF around 45% on recent echocardiogram done in July 2023.  New echo pending.  Blood pressure too low currently for any CHF medications in fact he is slightly dehydrated and has AKI, gentle IV fluids.  Monitor closely.  AKI.  Creatinine around 1.1 few months ago.  Discussed with his CHF physician Dr. Aundra Dubin, hold blood pressure medications today, gentle  hydration, cut down Entresto Coreg and Aldactone dose from tomorrow if blood pressure tolerates.  Monitor renal function.  Primary hypertension -Hypotensive and dehydrated gentle hydration hold blood pressure medications today.  Atrial flutter ([place]) Continue low-dose Coreg from tomorrow as blood pressure tolerates, for now as needed IV Lopressor, continue Eliquis, [place] vas 2 score of  3.        Condition - Fair  Family Communication  :  None  Code Status :  Full  Consults  :  Cards  PUD Prophylaxis :    Procedures  :     TTE      Disposition Plan  :    Status is: Observation  DVT Prophylaxis  :     apixaban (ELIQUIS) tablet 5 mg    Lab Results  Component Value Date   PLT 207 07/14/2022    Diet :  Diet Order             Diet Heart Room service appropriate? Yes; Fluid consistency: Thin  Diet effective now                    Inpatient Medications  Scheduled Meds:  apixaban  5 mg Oral BID   carvedilol  3.125 mg Oral BID   [START ON 07/15/2022] dapagliflozin propanediol  10 mg Oral Daily   multivitamin with minerals  1 tablet Oral Daily   [START ON 07/15/2022] sacubitril-valsartan  1 tablet Oral BID   [START ON 07/15/2022] spironolactone  12.5 mg Oral Daily   Continuous Infusions:  amiodarone 30 mg/hr (07/14/22 0627)   lactated ringers 100 mL/hr at 07/14/22 0822   PRN Meds:.acetaminophen **OR** acetaminophen, ondansetron **OR** ondansetron (ZOFRAN) IV, polyethylene glycol  Antibiotics  :    Anti-infectives (From admission, onward)    None         Objective:   Vitals:   07/14/22 0600 07/14/22 0630 07/14/22 0649 07/14/22 0730  BP: 120/77 114/85  108/85  Pulse: 64 62  (!) 57  Resp: 14 13  12   Temp:   98.6 F (37 C)   TempSrc:   Oral   SpO2: 97% 93%  94%  Weight:      Height:        Wt Readings from Last 3 Encounters:  07/13/22 81.6 kg  06/08/22 82.6 kg  02/15/22 85.3 kg     Intake/Output Summary (Last 24 hours) at 07/14/2022  0827 Last data filed at 07/14/2022 0865 Gross per 24 hour  Intake 742 ml  Output --  Net 742 ml     Physical Exam  Awake Alert, No new F.N deficits, Normal affect [place].AT,PERRAL Supple Neck, No JVD,   Symmetrical Chest wall movement, Good air movement bilaterally, CTAB RRR,No Gallops,Rubs or new Murmurs,  +ve B.Sounds, Abd Soft, No tenderness,   No Cyanosis, Clubbing or edema      Data Review:    Recent Labs  Lab 07/13/22 1901 07/13/22 2157 07/14/22 0416  WBC 7.6  --  7.0  HGB 17.8* 18.0* 14.1  HCT 51.5 53.0* 41.8  PLT 247  --  207  MCV 93.8  --  95.4  MCH 32.4  --  32.2  MCHC 34.6  --  33.7  RDW 13.9  --  12.8  LYMPHSABS 1.3  --   --   MONOABS 0.5  --   --   EOSABS 0.0  --   --   BASOSABS 0.0  --   --     Recent Labs  Lab 07/13/22 1901 07/13/22 2022 07/13/22 2135 07/13/22 2157 07/13/22 2334 07/14/22 0416  NA  --  137 138 142  --  136  K  --  SPECIMEN HEMOLYZED. HEMOLYSIS MAY AFFECT INTEGRITY OF RESULTS. 4.0 4.0  --  4.1  CL  --  103 108 104  --  103  CO2  --  23 21*  --   --  19*  ANIONGAP  --  11 9  --   --  14  GLUCOSE  --  99 79 75  --  94  BUN  --  15 14 18   --  18  CREATININE  --  1.79* 1.68* 1.60*  --  1.71*  AST  --  SPECIMEN HEMOLYZED. HEMOLYSIS MAY AFFECT INTEGRITY OF RESULTS. 28  --   --   --   ALT  --  SPECIMEN HEMOLYZED. HEMOLYSIS MAY AFFECT INTEGRITY OF RESULTS. 16  --   --   --   ALKPHOS  --  SPECIMEN HEMOLYZED. HEMOLYSIS MAY AFFECT INTEGRITY OF RESULTS. 54  --   --   --   BILITOT  --  SPECIMEN HEMOLYZED. HEMOLYSIS MAY AFFECT INTEGRITY OF RESULTS. 1.5*  --   --   --   ALBUMIN  --  3.6 3.4*  --   --   --   LATICACIDVEN  --   --  1.9  --  1.2  --   INR  --  1.5*  --   --   --   --   TSH  --   --  1.317  --   --   --   BNP 76.9  --   --   --   --   --   MG  --  SPECIMEN HEMOLYZED. HEMOLYSIS MAY AFFECT INTEGRITY OF RESULTS.  --   --   --  1.9  CALCIUM  --  9.2 9.0  --   --  8.6*      Radiology Reports DG Chest Port 1 View  Result Date:  07/13/2022 CLINICAL DATA:  Shortness of breath. EXAM: PORTABLE CHEST 1 VIEW COMPARISON:  09/15/2021 FINDINGS: Borderline/mild cardiomegaly. Normal mediastinal contours. Presumed loop recorder in the left chest wall. No pulmonary edema, focal airspace disease or pleural effusion. No pneumothorax. No acute osseous findings. IMPRESSION: Borderline/mild cardiomegaly. No acute pulmonary process. Electronically Signed   By: Keith Rake M.D.   On: 07/13/2022 19:14      Signature  -   Lala Lund M.D on 07/14/2022 at 8:27 AM   -  To page go to [email or web address]

## 2022-07-15 DIAGNOSIS — R000 Tachycardia, unspecified: Secondary | ICD-10-CM | POA: Diagnosis not present

## 2022-07-15 LAB — GLUCOSE, CAPILLARY: Glucose-Capillary: 128 mg/dL — ABNORMAL HIGH (ref 70–99)

## 2022-07-15 LAB — BASIC METABOLIC PANEL
Anion gap: 9 (ref 5–15)
BUN: 15 mg/dL (ref 6–20)
CO2: 23 mmol/L (ref 22–32)
Calcium: 8.7 mg/dL — ABNORMAL LOW (ref 8.9–10.3)
Chloride: 104 mmol/L (ref 98–111)
Creatinine, Ser: 1.27 mg/dL — ABNORMAL HIGH (ref 0.61–1.24)
GFR, Estimated: >60 mL/min (ref >60)
Glucose, Bld: 120 mg/dL — ABNORMAL HIGH (ref 70–99)
Potassium: 3.5 mmol/L (ref 3.5–5.1)
Sodium: 136 mmol/L (ref 135–145)

## 2022-07-15 LAB — CBC WITH DIFFERENTIAL/PLATELET
Abs Immature Granulocytes: 0.01 10*3/uL (ref 0.00–0.07)
Basophils Absolute: 0 10*3/uL (ref 0.0–0.1)
Basophils Relative: 1 %
Eosinophils Absolute: 0.1 10*3/uL (ref 0.0–0.5)
Eosinophils Relative: 2 %
HCT: 38.1 % — ABNORMAL LOW (ref 39.0–52.0)
Hemoglobin: 12.6 g/dL — ABNORMAL LOW (ref 13.0–17.0)
Immature Granulocytes: 0 %
Lymphocytes Relative: 41 %
Lymphs Abs: 2.1 10*3/uL (ref 0.7–4.0)
MCH: 31.8 pg (ref 26.0–34.0)
MCHC: 33.1 g/dL (ref 30.0–36.0)
MCV: 96.2 fL (ref 80.0–100.0)
Monocytes Absolute: 0.6 10*3/uL (ref 0.1–1.0)
Monocytes Relative: 12 %
Neutro Abs: 2.2 10*3/uL (ref 1.7–7.7)
Neutrophils Relative %: 44 %
Platelets: 177 10*3/uL (ref 150–400)
RBC: 3.96 MIL/uL — ABNORMAL LOW (ref 4.22–5.81)
RDW: 12.7 % (ref 11.5–15.5)
WBC: 5 10*3/uL (ref 4.0–10.5)
nRBC: 0 % (ref 0.0–0.2)

## 2022-07-15 LAB — MAGNESIUM: Magnesium: 2 mg/dL (ref 1.7–2.4)

## 2022-07-15 LAB — APTT
aPTT: 48 seconds — ABNORMAL HIGH (ref 24–36)
aPTT: 68 seconds — ABNORMAL HIGH (ref 24–36)

## 2022-07-15 LAB — BRAIN NATRIURETIC PEPTIDE: B Natriuretic Peptide: 121.3 pg/mL — ABNORMAL HIGH (ref 0.0–100.0)

## 2022-07-15 MED ORDER — POTASSIUM CHLORIDE CRYS ER 20 MEQ PO TBCR
40.0000 meq | EXTENDED_RELEASE_TABLET | Freq: Once | ORAL | Status: DC
  Filled 2022-07-15: qty 2

## 2022-07-15 MED ORDER — ENSURE ENLIVE PO LIQD
237.0000 mL | Freq: Two times a day (BID) | ORAL | Status: DC
  Administered 2022-07-16 – 2022-07-19 (×4): 237 mL via ORAL

## 2022-07-15 MED ORDER — PROSOURCE PLUS PO LIQD
30.0000 mL | Freq: Three times a day (TID) | ORAL | Status: DC
  Administered 2022-07-15 – 2022-07-19 (×9): 30 mL via ORAL
  Filled 2022-07-15 (×10): qty 30

## 2022-07-15 MED ORDER — POTASSIUM CHLORIDE CRYS ER 20 MEQ PO TBCR
40.0000 meq | EXTENDED_RELEASE_TABLET | Freq: Once | ORAL | Status: AC
  Administered 2022-07-15: 40 meq via ORAL
  Filled 2022-07-15: qty 2

## 2022-07-15 NOTE — Plan of Care (Signed)
  Problem: Activity: Goal: Risk for activity intolerance will decrease Outcome: Progressing   Problem: Coping: Goal: Level of anxiety will decrease Outcome: Progressing   Problem: Safety: Goal: Ability to remain free from injury will improve Outcome: Progressing   

## 2022-07-15 NOTE — Progress Notes (Signed)
[place] for Eliquis > Heparin Indication: atrial fibrillation  No Known Allergies  Patient Measurements: Height: 6' (182.9 cm) Weight: 79.7 kg (175 lb 9.6 oz) IBW/kg (Calculated) : 77.6 Heparin Dosing Weight: 81.6  Vital Signs: Temp: 98.3 F (36.8 C) (02/02 1426) Temp Source: Oral (02/02 1426) BP: 119/73 (02/02 1426) Pulse Rate: 54 (02/02 1426)  Labs: Recent Labs    07/13/22 1901 07/13/22 2022 07/13/22 2022 07/13/22 2135 07/13/22 2157 07/14/22 0416 07/15/22 0551 07/15/22 0554 07/15/22 1637  HGB 17.8*  --   --   --  18.0* 14.1 12.6*  --   --   HCT 51.5  --   --   --  53.0* 41.8 38.1*  --   --   PLT 247  --   --   --   --  207 177  --   --   APTT  --   --   --   --   --   --   --  48* 68*  LABPROT  --  17.7*  --   --   --   --   --   --   --   INR  --  1.5*  --   --   --   --   --   --   --   CREATININE  --  1.79*   < > 1.68* 1.60* 1.71*  --  1.27*  --   TROPONINIHS  --  47*  --  60*  --   --   --   --   --    < > = values in this interval not displayed.     Estimated Creatinine Clearance: 77.2 mL/min (A) (by C-G formula based on SCr of 1.27 mg/dL (H)).   Medical History: Past Medical History:  Diagnosis Date   Alcohol abuse    Asthma    Cardiomyopathy ([place])    Dyspnea    Hypertension     Assessment: 53 YOM with atrial fibrillation on Eliquis PTA now holding Eliquis for possible ICD placement. Last dose Eliquis 2/1 AM at 0819. Pharmacy consulted for heparin.  Repeat aPTT this pm is 68 seconds and therapeutic.  Goal of Therapy:  Heparin level 0.3-0.7 units/ml aPTT 66-102 seconds Monitor platelets by anticoagulation protocol: Yes   Plan: Continue heparin 1350 units/hr  Monitor daily aPTT, heparin level, CBC Monitor for signs/symptoms of bleeding   Arrie Senate, PharmD, BCPS, [facility] Clinical Pharmacist [phone] Please check AMION for all [place] numbers 07/15/2022

## 2022-07-15 NOTE — Progress Notes (Signed)
PROGRESS NOTE                                                                                                                                                                                                             Patient Demographics:    Bryan Guerrero, is a 52 y.o. male, DOB - [birth year], KXF:[id number]  Outpatient Primary MD for the patient is Elsie Stain, MD    LOS - 1  Admit date - 07/13/2022    Chief Complaint  Patient presents with   Abdominal Pain    Pain on and off yesterday and today       Brief Narrative (HPI from H&P)    52 y.o. male with medical history significant of HFrEF, alcoholic cardiomyopathy, hypertension and prior history of alcohol abuse came to ED with complaint of palpitations and racing of heart, had a recent loop recorder placed few weeks ago, in the ER workup suggestive of wide-complex tachycardia along with AKI seen by cardiology and admitted to the hospital for further care.   Subjective:    Bryan Guerrero today has, No headache, No chest pain, No abdominal pain - No Nausea, No new weakness tingling or numbness, no SOB   Assessment  & Plan :    Nonspecific wide-complex tachycardia.  A-fib with aberrancy versus V. tach.  Seen by cardiology.  Has a loop recorder which was placed recently, cardiology to evaluate further for now continue amiodarone drip, low-dose Coreg and Entresto as tolerated by blood pressure and monitor on telemetry.  Echo noted with EF now down to 25% from previous EF of 45% in July 2023, cardiac MRI also noted, case discussed with EP physician Dr. Quentin Ore they would likely proceed with AICD placement on 07/18/2022.  Chronic systolic congestive heart failure ([place]) EF around now around 25% down from 45% 6 months ago in July 2023.  Blood pressure improved medications per CHF team, currently no shortness of breath.  Now on lower than home dose Entresto due to hypotension  along with lower than home dose Coreg.  AKI.  Creatinine around 1.1 few months ago.  Discussed with his CHF physician Dr. Aundra Dubin, hold blood pressure medications today, gentle hydration, cut down Entresto Coreg and Aldactone dose from tomorrow if blood pressure tolerates.  Monitor renal function.  Primary hypertension  -Hypotensive now resolved, continue to  monitor on present medication regimen.  Atrial flutter (HCC) - Continue low-dose Coreg from tomorrow as blood pressure tolerates, for now as needed IV Lopressor, continue Eliquis, [place] vas 2 score of 3.        Condition - Fair  Family Communication  :  None  Code Status :  Full  Consults  :  Cards  PUD Prophylaxis :    Procedures  :     TTE - 1. Left ventricular ejection fraction, by estimation, is 20 to 25%. The left ventricle has severely decreased function. The left ventricle has no regional wall motion abnormalities. The left ventricular internal cavity size was moderately dilated. Left ventricular diastolic parameters were normal.  2. Right ventricular systolic function is normal. The right ventricular size is normal. Mildly increased right ventricular wall thickness. There is normal pulmonary artery systolic pressure.  3. Left atrial size was moderately dilated.  4. Right atrial size was mildly dilated.  5. The mitral valve is normal in structure. Trivial mitral valve regurgitation. No evidence of mitral stenosis.  6. The aortic valve is normal in structure. Aortic valve regurgitation is not visualized. No aortic stenosis is present.  7. The inferior vena cava is normal in size with greater than 50% respiratory variability, suggesting right atrial pressure of 3 mmHg  Cardiac MRI - 1. 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 2. RV insertion site LGE, which is a nonspecific scar pattern often seen in setting of elevated pulmonary pressures 3.  Mild LV  dilatation with moderate systolic dysfunction (EF 31%) 4.  Normal RV size with moderate systolic dysfunction (EF 38%)      Disposition Plan  :    Status is: Observation  DVT Prophylaxis  :    Place and maintain sequential compression device Start: 07/14/22 1004    Lab Results  Component Value Date   PLT 177 07/15/2022    Diet :  Diet Order             Diet Heart Room service appropriate? Yes; Fluid consistency: Thin  Diet effective now                    Inpatient Medications  Scheduled Meds:  carvedilol  3.125 mg Oral BID   dapagliflozin propanediol  10 mg Oral Daily   multivitamin with minerals  1 tablet Oral Daily   sacubitril-valsartan  1 tablet Oral BID   spironolactone  12.5 mg Oral Daily   Continuous Infusions:  amiodarone 30 mg/hr (07/15/22 0443)   heparin 1,350 Units/hr (07/15/22 0831)   PRN Meds:.acetaminophen **OR** acetaminophen, ondansetron **OR** ondansetron (ZOFRAN) IV, polyethylene glycol  Antibiotics  :    Anti-infectives (From admission, onward)    None         Objective:   Vitals:   07/14/22 1445 07/14/22 2011 07/15/22 0552 07/15/22 0830  BP:  (!) 127/93 (!) 137/90 (!) 131/90  Pulse:  (!) 55 (!) 50 61  Resp:  14 14 15   Temp:  98.5 F (36.9 C) 98.5 F (36.9 C) 98.3 F (36.8 C)  TempSrc:  Oral Oral Oral  SpO2:  97% 100% 98%  Weight: 79.6 kg  79.7 kg   Height: 6' (1.829 m)       Wt Readings from Last 3 Encounters:  07/15/22 79.7 kg  06/08/22 82.6 kg  02/15/22 85.3 kg     Intake/Output Summary (Last 24 hours) at 07/15/2022 1015  Last data filed at 07/15/2022 0443 Gross per 24 hour  Intake 1123.58 ml  Output --  Net 1123.58 ml     Physical Exam  Awake Alert, No new F.N deficits, Normal affect [place].AT,PERRAL Supple Neck, No JVD,   Symmetrical Chest wall movement, Good air movement bilaterally, CTAB RRR,No Gallops,Rubs or new Murmurs,  +ve B.Sounds, Abd Soft, No tenderness,   No Cyanosis, Clubbing or edema       Data Review:    Recent Labs  Lab 07/13/22 1901 07/13/22 2157 07/14/22 0416 07/15/22 0551  WBC 7.6  --  7.0 5.0  HGB 17.8* 18.0* 14.1 12.6*  HCT 51.5 53.0* 41.8 38.1*  PLT 247  --  207 177  MCV 93.8  --  95.4 96.2  MCH 32.4  --  32.2 31.8  MCHC 34.6  --  33.7 33.1  RDW 13.9  --  12.8 12.7  LYMPHSABS 1.3  --   --  2.1  MONOABS 0.5  --   --  0.6  EOSABS 0.0  --   --  0.1  BASOSABS 0.0  --   --  0.0    Recent Labs  Lab 07/13/22 1901 07/13/22 2022 07/13/22 2135 07/13/22 2157 07/13/22 2334 07/14/22 0416 07/15/22 0554  NA  --  137 138 142  --  136 136  K  --  SPECIMEN HEMOLYZED. HEMOLYSIS MAY AFFECT INTEGRITY OF RESULTS. 4.0 4.0  --  4.1 3.5  CL  --  103 108 104  --  103 104  CO2  --  23 21*  --   --  19* 23  ANIONGAP  --  11 9  --   --  14 9  GLUCOSE  --  99 79 75  --  94 120*  BUN  --  15 14 18   --  18 15  CREATININE  --  1.79* 1.68* 1.60*  --  1.71* 1.27*  AST  --  SPECIMEN HEMOLYZED. HEMOLYSIS MAY AFFECT INTEGRITY OF RESULTS. 28  --   --   --   --   ALT  --  SPECIMEN HEMOLYZED. HEMOLYSIS MAY AFFECT INTEGRITY OF RESULTS. 16  --   --   --   --   ALKPHOS  --  SPECIMEN HEMOLYZED. HEMOLYSIS MAY AFFECT INTEGRITY OF RESULTS. 54  --   --   --   --   BILITOT  --  SPECIMEN HEMOLYZED. HEMOLYSIS MAY AFFECT INTEGRITY OF RESULTS. 1.5*  --   --   --   --   ALBUMIN  --  3.6 3.4*  --   --   --   --   LATICACIDVEN  --   --  1.9  --  1.2  --   --   INR  --  1.5*  --   --   --   --   --   TSH  --   --  1.317  --   --   --   --   BNP 76.9  --   --   --   --   --  121.3*  MG  --  SPECIMEN HEMOLYZED. HEMOLYSIS MAY AFFECT INTEGRITY OF RESULTS.  --   --   --  1.9 2.0  CALCIUM  --  9.2 9.0  --   --  8.6* 8.7*      Radiology Reports MR CARDIAC MORPHOLOGY W WO CONTRAST  Result Date: 07/14/2022 CLINICAL DATA:  75M with NICM p/w WCT EXAM: CARDIAC MRI  TECHNIQUE: The patient was scanned on a 1.5 Tesla Siemens magnet. A dedicated cardiac coil was used. Functional imaging was done using Fiesta  sequences. 2,3, and 4 chamber views were done to assess for RWMA's. Modified Simpson's rule using a short axis stack was used to calculate an ejection fraction on a dedicated work [employer], [employer]. The patient received 10 cc of Gadavist. After 10 minutes inversion recovery sequences were used to assess for infiltration and scar tissue. Phase contrast velocity mapping was performed above the aortic and pulmonic valves CONTRAST:  10 cc  of Gadavist FINDINGS: Left ventricle: -Mild dilatation -Moderate systolic dysfunction -Elevated ECV (37%) -Basal septal midwall LGE -RV insertion site LGE LV EF: 31% (Normal 49-79%) Absolute volumes: LV EDV: (Normal 95-215 mL) LV ESV: (Normal 25-85 mL) LV SV: 82mL (Normal 61-145 mL) CO: 3.6L/min (Normal 3.4-7.8 L/min) Indexed volumes: LV EDV: 153mL/sq-m (Normal 50-108 mL/sq-m) LV ESV: 40mL/sq-m (Normal 11-47 mL/sq-m) LV SV: 22mL/sq-m (Normal 33-72 mL/sq-m) CI: 1.8L/min/sq-m (Normal 1.8-4.2 L/min/sq-m) Right ventricle: Normal size with moderate systolic dysfunction RV EF:  38% (Normal 51-80%) Absolute volumes: RV EDV: (Normal 109-217 mL) RV ESV: (Normal 23-91 mL) RV SV: 47mL (Normal 71-141 mL) CO: 3.4L/min (Normal 2.8-8.8 L/min) Indexed volumes: RV EDV: 36mL/sq-m (Normal 58-109 mL/sq-m) RV ESV: 66mL/sq-m (Normal 12-46 mL/sq-m) RV SV: 33mL/sq-m (Normal 38-71 mL/sq-m) CI: 1.7L/min/sq-m (Normal 1.7-4.2 L/min/sq-m) Left atrium: Moderate enlargement Right atrium: Normal size Mitral valve: Trivial regurgitation Aortic valve: No regurgitation Tricuspid valve: Trivial regurgitation Pulmonic valve: No regurgitation Aorta: Normal proximal ascending aorta Pericardium: Small effusion IMPRESSION: 1. 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 2. RV insertion site LGE, which is a nonspecific scar pattern often seen in setting of elevated pulmonary pressures 3.  Mild LV dilatation with  moderate systolic dysfunction (EF 31%) 4.  Normal RV size with moderate systolic dysfunction (EF 38%) Electronically Signed   By: Epifanio Lesches M.D.   On: 07/14/2022 23:31   MR CARDIAC VELOCITY FLOW MAP  Result Date: 07/14/2022 CLINICAL DATA:  37M with NICM p/w WCT EXAM: CARDIAC MRI TECHNIQUE: The patient was scanned on a 1.5 Tesla Siemens magnet. A dedicated cardiac coil was used. Functional imaging was done using Fiesta sequences. 2,3, and 4 chamber views were done to assess for RWMA's. Modified Simpson's rule using a short axis stack was used to calculate an ejection fraction on a dedicated work [employer] officer, [employer]. The patient received 10 cc of Gadavist. After 10 minutes inversion recovery sequences were used to assess for infiltration and scar tissue. Phase contrast velocity mapping was performed above the aortic and pulmonic valves CONTRAST:  10 cc  of Gadavist FINDINGS: Left ventricle: -Mild dilatation -Moderate systolic dysfunction -Elevated ECV (37%) -Basal septal midwall LGE -RV insertion site LGE LV EF: 31% (Normal 49-79%) Absolute volumes: LV EDV: (Normal 95-215 mL) LV ESV: (Normal 25-85 mL) LV SV: 15mL (Normal 61-145 mL) CO: 3.6L/min (Normal 3.4-7.8 L/min) Indexed volumes: LV EDV: 143mL/sq-m (Normal 50-108 mL/sq-m) LV ESV: 23mL/sq-m (Normal 11-47 mL/sq-m) LV SV: 68mL/sq-m (Normal 33-72 mL/sq-m) CI: 1.8L/min/sq-m (Normal 1.8-4.2 L/min/sq-m) Right ventricle: Normal size with moderate systolic dysfunction RV EF:  38% (Normal 51-80%) Absolute volumes: RV EDV: (Normal 109-217 mL) RV ESV: (Normal 23-91 mL) RV SV: 84mL (Normal 71-141 mL) CO: 3.4L/min (Normal 2.8-8.8 L/min) Indexed volumes: RV EDV: 50mL/sq-m (Normal 58-109 mL/sq-m) RV ESV: 64mL/sq-m (Normal 12-46 mL/sq-m) RV SV: 1mL/sq-m (Normal 38-71 mL/sq-m)  CI: 1.7L/min/sq-m (Normal 1.7-4.2 L/min/sq-m) Left atrium: Moderate enlargement Right atrium: Normal size Mitral valve: Trivial regurgitation Aortic valve: No  regurgitation Tricuspid valve: Trivial regurgitation Pulmonic valve: No regurgitation Aorta: Normal proximal ascending aorta Pericardium: Small effusion IMPRESSION: 1. 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 2. RV insertion site LGE, which is a nonspecific scar pattern often seen in setting of elevated pulmonary pressures 3.  Mild LV dilatation with moderate systolic dysfunction (EF 31%) 4.  Normal RV size with moderate systolic dysfunction (EF 38%) Electronically Signed   By: Epifanio Lesches M.D.   On: 07/14/2022 23:31   MR CARDIAC VELOCITY FLOW MAP  Result Date: 07/14/2022 CLINICAL DATA:  71M with NICM p/w WCT EXAM: CARDIAC MRI TECHNIQUE: The patient was scanned on a 1.5 Tesla Siemens magnet. A dedicated cardiac coil was used. Functional imaging was done using Fiesta sequences. 2,3, and 4 chamber views were done to assess for RWMA's. Modified Simpson's rule using a short axis stack was used to calculate an ejection fraction on a dedicated work [employer] officer, [employer]. The patient received 10 cc of Gadavist. After 10 minutes inversion recovery sequences were used to assess for infiltration and scar tissue. Phase contrast velocity mapping was performed above the aortic and pulmonic valves CONTRAST:  10 cc  of Gadavist FINDINGS: Left ventricle: -Mild dilatation -Moderate systolic dysfunction -Elevated ECV (37%) -Basal septal midwall LGE -RV insertion site LGE LV EF: 31% (Normal 49-79%) Absolute volumes: LV EDV: (Normal 95-215 mL) LV ESV: (Normal 25-85 mL) LV SV: 93mL (Normal 61-145 mL) CO: 3.6L/min (Normal 3.4-7.8 L/min) Indexed volumes: LV EDV: 148mL/sq-m (Normal 50-108 mL/sq-m) LV ESV: 64mL/sq-m (Normal 11-47 mL/sq-m) LV SV: 56mL/sq-m (Normal 33-72 mL/sq-m) CI: 1.8L/min/sq-m (Normal 1.8-4.2 L/min/sq-m) Right ventricle: Normal size with moderate systolic dysfunction RV EF:  38% (Normal 51-80%) Absolute  volumes: RV EDV: (Normal 109-217 mL) RV ESV: (Normal 23-91 mL) RV SV: 2mL (Normal 71-141 mL) CO: 3.4L/min (Normal 2.8-8.8 L/min) Indexed volumes: RV EDV: 70mL/sq-m (Normal 58-109 mL/sq-m) RV ESV: 56mL/sq-m (Normal 12-46 mL/sq-m) RV SV: 16mL/sq-m (Normal 38-71 mL/sq-m) CI: 1.7L/min/sq-m (Normal 1.7-4.2 L/min/sq-m) Left atrium: Moderate enlargement Right atrium: Normal size Mitral valve: Trivial regurgitation Aortic valve: No regurgitation Tricuspid valve: Trivial regurgitation Pulmonic valve: No regurgitation Aorta: Normal proximal ascending aorta Pericardium: Small effusion IMPRESSION: 1. 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 2. RV insertion site LGE, which is a nonspecific scar pattern often seen in setting of elevated pulmonary pressures 3.  Mild LV dilatation with moderate systolic dysfunction (EF 31%) 4.  Normal RV size with moderate systolic dysfunction (EF 38%) Electronically Signed   By: Epifanio Lesches M.D.   On: 07/14/2022 23:31   ECHOCARDIOGRAM COMPLETE  Result Date: 07/14/2022    ECHOCARDIOGRAM REPORT   Patient Name:   KAVISH LAFITTE Date of Exam: 07/14/2022 Medical Rec #:  [id number]       Height:       72.0 in Accession #:    [id number]      Weight:       180.0 lb Date of Birth:  11-05-1970      BSA:          2.037 m Patient Age:    51 years        BP:           108/85 mmHg Patient Gender: M  HR:           62 bpm. Exam Location:  Inpatient Procedure: 2D Echo, Cardiac Doppler and Color Doppler Indications:    congestive heart failure  History:        Patient has prior history of Echocardiogram examinations, most                 recent 12/21/2021. CHF and Cardiomyopathy, Arrythmias:Atrial                 Flutter, Signs/Symptoms:Dyspnea; Risk Factors:Hypertension and                 Current Smoker.  Sonographer:    Johny Chess RDCS Referring Phys: [id number] [place]   1. Left ventricular ejection fraction, by estimation, is 20 to 25%. The left ventricle has severely decreased function. The left ventricle has no regional wall motion abnormalities. The left ventricular internal cavity size was moderately dilated. Left ventricular diastolic parameters were normal.  2. Right ventricular systolic function is normal. The right ventricular size is normal. Mildly increased right ventricular wall thickness. There is normal pulmonary artery systolic pressure.  3. Left atrial size was moderately dilated.  4. Right atrial size was mildly dilated.  5. The mitral valve is normal in structure. Trivial mitral valve regurgitation. No evidence of mitral stenosis.  6. The aortic valve is normal in structure. Aortic valve regurgitation is not visualized. No aortic stenosis is present.  7. The inferior vena cava is normal in size with greater than 50% respiratory variability, suggesting right atrial pressure of 3 mmHg. FINDINGS  Left Ventricle: Left ventricular ejection fraction, by estimation, is 20 to 25%. The left ventricle has severely decreased function. The left ventricle has no regional wall motion abnormalities. The left ventricular internal cavity size was moderately dilated. There is no left ventricular hypertrophy. Left ventricular diastolic parameters were normal. Right Ventricle: The right ventricular size is normal. Mildly increased right ventricular wall thickness. Right ventricular systolic function is normal. There is normal pulmonary artery systolic pressure. The tricuspid regurgitant velocity is 2.00 m/s, and with an assumed right atrial pressure of 3 mmHg, the estimated right ventricular systolic pressure is 56.4 mmHg. Left Atrium: Left atrial size was moderately dilated. Right Atrium: Right atrial size was mildly dilated. Pericardium: There is no evidence of pericardial effusion. Mitral Valve: The mitral valve is normal in structure. Trivial mitral valve regurgitation. No  evidence of mitral valve stenosis. Tricuspid Valve: The tricuspid valve is normal in structure. Tricuspid valve regurgitation is not demonstrated. No evidence of tricuspid stenosis. Aortic Valve: The aortic valve is normal in structure. Aortic valve regurgitation is not visualized. No aortic stenosis is present. Pulmonic Valve: The pulmonic valve was normal in structure. Pulmonic valve regurgitation is not visualized. No evidence of pulmonic stenosis. Aorta: The aortic root is normal in size and structure. Venous: The inferior vena cava is normal in size with greater than 50% respiratory variability, suggesting right atrial pressure of 3 mmHg. IAS/Shunts: No atrial level shunt detected by color flow Doppler.  LEFT VENTRICLE PLAX 2D LVIDd:         6.10 cm      Diastology LVIDs:         5.00 cm      LV e' medial:    9.46 cm/s LV PW:         1.00 cm      LV E/e' medial:  7.5 LV IVS:  0.80 cm      LV e' lateral:   10.80 cm/s LVOT diam:     2.00 cm      LV E/e' lateral: 6.6 LV SV:         55 LV SV Index:   27 LVOT Area:     3.14 cm  LV Volumes (MOD) LV vol d, MOD A4C: 110.0 ml LV vol s, MOD A4C: 77.0 ml LV SV MOD A4C:     110.0 ml RIGHT VENTRICLE             IVC RV Basal diam:  3.30 cm     IVC diam: 1.80 cm RV S prime:     10.10 cm/s TAPSE (M-mode): 2.1 cm LEFT ATRIUM             Index        RIGHT ATRIUM           Index LA diam:        3.80 cm 1.87 cm/m   RA Area:     14.30 cm LA Vol (A2C):   82.5 ml 40.50 ml/m  RA Volume:   36.60 ml  17.97 ml/m LA Vol (A4C):   77.5 ml 38.04 ml/m LA Biplane Vol: 83.7 ml 41.08 ml/m  AORTIC VALVE LVOT Vmax:   79.60 cm/s LVOT Vmean:  51.700 cm/s LVOT VTI:    0.176 m  AORTA Ao Root diam: 3.20 cm MITRAL VALVE               TRICUSPID VALVE MV Area (PHT): 4.57 cm    TR Peak grad:   16.0 mmHg MV Decel Time: 166 msec    TR Vmax:        200.00 cm/s MV E velocity: 71.10 cm/s MV A velocity: 25.30 cm/s  SHUNTS MV E/A ratio:  2.81        Systemic VTI:  0.18 m                             Systemic Diam: 2.00 cm Glori Bickers MD Electronically signed by Glori Bickers MD Signature Date/Time: 07/14/2022/5:30:27 PM    Final    DG Chest Port 1 View  Result Date: 07/13/2022 CLINICAL DATA:  Shortness of breath. EXAM: PORTABLE CHEST 1 VIEW COMPARISON:  09/15/2021 FINDINGS: Borderline/mild cardiomegaly. Normal mediastinal contours. Presumed loop recorder in the left chest wall. No pulmonary edema, focal airspace disease or pleural effusion. No pneumothorax. No acute osseous findings. IMPRESSION: Borderline/mild cardiomegaly. No acute pulmonary process. Electronically Signed   By: Keith Rake M.D.   On: 07/13/2022 19:14      Signature  -   Lala Lund M.D on 07/15/2022 at 10:15 AM   -  To page go to [email or web address]

## 2022-07-15 NOTE — Progress Notes (Signed)
ANTICOAGULATION CONSULT NOTE - Initial Consult  Pharmacy Consult for Eliquis > Heparin Indication: atrial fibrillation  No Known Allergies  Patient Measurements: Height: 6' (182.9 cm) Weight: 79.7 kg (175 lb 9.6 oz) IBW/kg (Calculated) : 77.6 Heparin Dosing Weight: 81.6  Vital Signs: Temp: 98.5 F (36.9 C) (02/02 0552) Temp Source: Oral (02/02 0552) BP: 137/90 (02/02 0552) Pulse Rate: 50 (02/02 0552)  Labs: Recent Labs    07/13/22 1901 07/13/22 2022 07/13/22 2022 07/13/22 2135 07/13/22 2157 07/14/22 0416 07/15/22 0551 07/15/22 0554  HGB 17.8*  --   --   --  18.0* 14.1 12.6*  --   HCT 51.5  --   --   --  53.0* 41.8 38.1*  --   PLT 247  --   --   --   --  207 177  --   APTT  --   --   --   --   --   --   --  48*  LABPROT  --  17.7*  --   --   --   --   --   --   INR  --  1.5*  --   --   --   --   --   --   CREATININE  --  1.79*   < > 1.68* 1.60* 1.71*  --  1.27*  TROPONINIHS  --  47*  --  60*  --   --   --   --    < > = values in this interval not displayed.     Estimated Creatinine Clearance: 77.2 mL/min (A) (by C-G formula based on SCr of 1.27 mg/dL (H)).   Medical History: Past Medical History:  Diagnosis Date   Alcohol abuse    Asthma    Cardiomyopathy ([place])    Dyspnea    Hypertension     Assessment: Bryan Guerrero with atrial fibrillation on Eliquis PTA now holding Eliquis for possible ICD placement. Last dose Eliquis 2/1 AM at 0819. Pharmacy consulted for heparin.  aPTT 48 is subtherapeutic on 1200 units/hr.  No issues with infusion or bleeding per RN. ICD placement planned for 2/5.   Goal of Therapy:  Heparin level 0.3-0.7 units/ml aPTT 66-102 seconds Monitor platelets by anticoagulation protocol: Yes   Plan: Increase Heparin to 1350 units/hr  F/u aPTT until correlates with heparin level  Monitor daily aPTT, heparin level, CBC Monitor for signs/symptoms of bleeding      Benetta Spar, PharmD, BCPS, BCCP Clinical Pharmacist  Please check AMION for  all [facility] phone numbers After 10:00 PM, call [place]

## 2022-07-15 NOTE — Progress Notes (Signed)
Electrophysiology Rounding Note  Patient Name: Bryan Guerrero Date of Encounter: 07/15/2022  Primary Cardiologist: Sanda Klein, MD Electrophysiologist: Vickie Epley, MD   Subjective   The patient is doing well today.  At this time, the patient denies chest pain, shortness of breath, or any new concerns.  Inpatient Medications    Scheduled Meds:  carvedilol  3.125 mg Oral BID   dapagliflozin propanediol  10 mg Oral Daily   multivitamin with minerals  1 tablet Oral Daily   sacubitril-valsartan  1 tablet Oral BID   spironolactone  12.5 mg Oral Daily   Continuous Infusions:  amiodarone 30 mg/hr (07/15/22 0443)   heparin 1,200 Units/hr (07/15/22 0443)   PRN Meds: acetaminophen **OR** acetaminophen, ondansetron **OR** ondansetron (ZOFRAN) IV, polyethylene glycol   Vital Signs    Vitals:   07/14/22 1300 07/14/22 1445 07/14/22 2011 07/15/22 0552  BP: 125/86  (!) 127/93 (!) 137/90  Pulse: (!) 59  (!) 55 (!) 50  Resp: 11  14 14   Temp:   98.5 F (36.9 C) 98.5 F (36.9 C)  TempSrc:   Oral Oral  SpO2: 97%  97% 100%  Weight:  79.6 kg  79.7 kg  Height:  6' (1.829 m)      Intake/Output Summary (Last 24 hours) at 07/15/2022 0756 Last data filed at 07/15/2022 0443 Gross per 24 hour  Intake 1123.58 ml  Output --  Net 1123.58 ml   Filed Weights   07/13/22 2031 07/14/22 1445 07/15/22 0552  Weight: 81.6 kg 79.6 kg 79.7 kg    Physical Exam    GEN- The patient is well appearing, alert and oriented x 3 today.   HEENT- No gross abnormality.  Lungs- Clear to ausculation bilaterally, normal work of breathing Heart- Regular rate and rhythm, no murmurs, rubs or gallops GI- soft, NT, ND, + BS Extremities- no clubbing or cyanosis. No edema Neuro- No obvious focal abnormality.   Telemetry    Sinus brady 50s primarily (personally reviewed)   Patient Profile     Bryan Guerrero is a 52 y.o. male with a history of HFrEF, paroxysmal AF, AFL s/p CTI, ETOH cardiomyopathy,  HTN, and prior history of ETOH abuse who is being seen today for the evaluation of wide-complex tachycardia at the request of Dr. Candiss Norse.  Assessment & Plan    1.  WCT -> Likely VT He has been started on amiodarone with concerns for VT given his cardiomyopathy and scar on cMRI Continue BB Echo 07/14/22 LVEF 20-25% cMRI LVEF 31%, stable LGE from prior.  Potassium3.5 (02/02 0554) Magnesium  2.0 (02/02 0554) Creatinine, ser  1.27* (02/02 0554) Keep K > 4.0 and Mg > 2.0  Plan on ICD Monday   2. Paroxysmal AF 3. 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  Will hold for procedures.  Continue heparin, hold midnight Sunday into Monday.    4. Chronic systolic CHF ? Non-impaction vs ETOH induced, though ETOH less likely to cause scar on cmRI Echo with EF 20-25%,  cMRI with LVEF 31%, Basal septal midwall LGE, RV insertion LGE, similar to previous.  Continue coreg, farxiga, spiro, and entresto. Discussed with HF team and will plan close outpatient follow up unless decompensates while here.   5. Sinus bradycardia Titrate BB as tolerated post pacing  6. ETOH abuse He alludes to the fact he is still drinking beer. Abstinence has been encouraged.   For questions or updates, please contact [place] Please consult [email or web address] for  contact info under Cardiology/STEMI.  Signed, Shirley Friar, PA-C  07/15/2022, 7:56 AM

## 2022-07-15 NOTE — Progress Notes (Signed)
Initial Nutrition Assessment  DOCUMENTATION CODES:  Not applicable  INTERVENTION:  Encourage adequate PO intake Ensure Enlive po BID, each supplement provides 350 kcal and 20 grams of protein. 30 ml ProSource Plus TID, each supplement provides 100 kcals and 15 grams protein.  MVI with minerals daily  NUTRITION DIAGNOSIS:  Inadequate oral intake related to chronic illness (HF) as evidenced by per patient/family report.  GOAL:  Patient will meet greater than or equal to 90% of their needs  MONITOR:  PO intake, Supplement acceptance, Weight trends, Labs  REASON FOR ASSESSMENT:  Malnutrition Screening Tool    ASSESSMENT:  Pt admitted with c/o abdominal pain, admitted with tachycardia and AKI. PMH significant for HFrEF, alcoholic cardiomyopathy, HTN and prior h/o alcohol abuse.   Echo noted with EF now down to 25% from previous EF of 45% in July 2023. Plans for ICD on Monday.   Patient attempting to rest at time of visit. He states that he has a fair appetite but he does not eat much. He attributes this to being busy and not eating when he is hungry then feels as though his hunger pangs pass by the time he is able to eat. He recalls eating about 1 meal per day on average.   Meal completions: 2/2: 75% breakfast  Pt reports having had about a 10 lb weight loss since November but is unable to determine what this may be attributed to. Reviewed documented weight history on file. Pt's weight has decreased from 88.8 kg on 12/21/21 to 79.7 kg this admission. This is a weight loss of 10.2% which is concerning but not clinically significant for time frame.   Medication: farxiga, MVI IV drips: amio  Labs: Cr 1.27  NUTRITION - FOCUSED PHYSICAL EXAM: Flowsheet Row Most Recent Value  Orbital Region No depletion  Upper Arm Region No depletion  Thoracic and Lumbar Region No depletion  Buccal Region No depletion  Temple Region No depletion  Clavicle Bone Region No depletion  Clavicle and  Acromion Bone Region No depletion  Scapular Bone Region No depletion  Dorsal Hand No depletion  Patellar Region No depletion  Anterior Thigh Region No depletion  Posterior Calf Region No depletion  Edema (RD Assessment) None  Hair Reviewed  Eyes Reviewed  Mouth Reviewed  Skin Reviewed  Nails Reviewed      Diet Order:   Diet Order             Diet Heart Room service appropriate? Yes; Fluid consistency: Thin  Diet effective now                  EDUCATION NEEDS:  Education needs have been addressed  Skin:  Skin Assessment: Reviewed RN Assessment  Last BM:  2/1  Height:  Ht Readings from Last 1 Encounters:  07/14/22 6' (1.829 m)    Weight:  Wt Readings from Last 1 Encounters:  07/15/22 79.7 kg   BMI:  Body mass index is 23.82 kg/m.  Estimated Nutritional Needs:   Kcal:  2200-2400  Protein:  110-125g  Fluid:  >/=2L  Clayborne Dana, RDN, LDN Clinical Nutrition

## 2022-07-16 DIAGNOSIS — R000 Tachycardia, unspecified: Secondary | ICD-10-CM | POA: Diagnosis not present

## 2022-07-16 LAB — CBC WITH DIFFERENTIAL/PLATELET
Abs Immature Granulocytes: 0 10*3/uL (ref 0.00–0.07)
Basophils Absolute: 0 10*3/uL (ref 0.0–0.1)
Basophils Relative: 1 %
Eosinophils Absolute: 0.1 10*3/uL (ref 0.0–0.5)
Eosinophils Relative: 2 %
HCT: 37.6 % — ABNORMAL LOW (ref 39.0–52.0)
Hemoglobin: 12.3 g/dL — ABNORMAL LOW (ref 13.0–17.0)
Immature Granulocytes: 0 %
Lymphocytes Relative: 46 %
Lymphs Abs: 1.9 10*3/uL (ref 0.7–4.0)
MCH: 32 pg (ref 26.0–34.0)
MCHC: 32.7 g/dL (ref 30.0–36.0)
MCV: 97.9 fL (ref 80.0–100.0)
Monocytes Absolute: 0.5 10*3/uL (ref 0.1–1.0)
Monocytes Relative: 12 %
Neutro Abs: 1.5 10*3/uL — ABNORMAL LOW (ref 1.7–7.7)
Neutrophils Relative %: 39 %
Platelets: 150 10*3/uL (ref 150–400)
RBC: 3.84 MIL/uL — ABNORMAL LOW (ref 4.22–5.81)
RDW: 12.4 % (ref 11.5–15.5)
WBC: 3.9 10*3/uL — ABNORMAL LOW (ref 4.0–10.5)
nRBC: 0 % (ref 0.0–0.2)

## 2022-07-16 LAB — BASIC METABOLIC PANEL
Anion gap: 7 (ref 5–15)
BUN: 12 mg/dL (ref 6–20)
CO2: 24 mmol/L (ref 22–32)
Calcium: 8.7 mg/dL — ABNORMAL LOW (ref 8.9–10.3)
Chloride: 106 mmol/L (ref 98–111)
Creatinine, Ser: 1.36 mg/dL — ABNORMAL HIGH (ref 0.61–1.24)
GFR, Estimated: >60 mL/min (ref >60)
Glucose, Bld: 119 mg/dL — ABNORMAL HIGH (ref 70–99)
Potassium: 3.5 mmol/L (ref 3.5–5.1)
Sodium: 137 mmol/L (ref 135–145)

## 2022-07-16 LAB — GLUCOSE, CAPILLARY: Glucose-Capillary: 93 mg/dL (ref 70–99)

## 2022-07-16 LAB — APTT: aPTT: 80 seconds — ABNORMAL HIGH (ref 24–36)

## 2022-07-16 LAB — BRAIN NATRIURETIC PEPTIDE: B Natriuretic Peptide: 346.9 pg/mL — ABNORMAL HIGH (ref 0.0–100.0)

## 2022-07-16 LAB — HEPARIN LEVEL (UNFRACTIONATED): Heparin Unfractionated: 0.35 IU/mL (ref 0.30–0.70)

## 2022-07-16 LAB — MAGNESIUM: Magnesium: 1.9 mg/dL (ref 1.7–2.4)

## 2022-07-16 MED ORDER — MAGNESIUM SULFATE 2 GM/50ML IV SOLN
2.0000 g | Freq: Once | INTRAVENOUS | Status: AC
  Administered 2022-07-16: 2 g via INTRAVENOUS
  Filled 2022-07-16: qty 50

## 2022-07-16 MED ORDER — POTASSIUM CHLORIDE CRYS ER 20 MEQ PO TBCR
40.0000 meq | EXTENDED_RELEASE_TABLET | Freq: Two times a day (BID) | ORAL | Status: AC
  Administered 2022-07-16 (×2): 40 meq via ORAL
  Filled 2022-07-16 (×2): qty 2

## 2022-07-16 MED ORDER — AMIODARONE HCL 200 MG PO TABS
200.0000 mg | ORAL_TABLET | Freq: Two times a day (BID) | ORAL | Status: DC
  Administered 2022-07-16 – 2022-07-19 (×7): 200 mg via ORAL
  Filled 2022-07-16 (×7): qty 1

## 2022-07-16 NOTE — Progress Notes (Addendum)
[facility] for Eliquis > Heparin Indication: atrial fibrillation  No Known Allergies  Patient Measurements: Height: 6' (182.9 cm) Weight: 80.2 kg (176 lb 14.4 oz) IBW/kg (Calculated) : 77.6 Heparin Dosing Weight: 81.6  Vital Signs: Temp: 98.3 F (36.8 C) (02/03 0525) Temp Source: Oral (02/03 0525) BP: 123/80 (02/03 0525) Pulse Rate: 53 (02/03 0525)  Labs: Recent Labs    07/13/22 2022 07/13/22 2135 07/13/22 2157 07/14/22 0416 07/15/22 0551 07/15/22 0554 07/15/22 1637 07/16/22 0036  HGB  --   --    < > 14.1 12.6*  --   --  12.3*  HCT  --   --    < > 41.8 38.1*  --   --  37.6*  PLT  --   --   --  207 177  --   --  150  APTT  --   --   --   --   --  48* 68* 80*  LABPROT 17.7*  --   --   --   --   --   --   --   INR 1.5*  --   --   --   --   --   --   --   HEPARINUNFRC  --   --   --   --   --   --   --  0.35  CREATININE 1.79* 1.68*   < > 1.71*  --  1.27*  --  1.36*  TROPONINIHS 47* 60*  --   --   --   --   --   --    < > = values in this interval not displayed.    Estimated Creatinine Clearance: 72.1 mL/min (A) (by C-G formula based on SCr of 1.36 mg/dL (H)).   Medical History: Past Medical History:  Diagnosis Date   Alcohol abuse    Asthma    Cardiomyopathy ([place])    Dyspnea    Hypertension     Assessment: 58 YOM with atrial fibrillation on Eliquis PTA now holding Eliquis for possible ICD placement. Last dose Eliquis 2/1 AM at 0819. Pharmacy consulted for heparin.  Heparin level 0.35 and aPTT 80 on drip rate 1350 units/hr both therapeutic. Heparin level and aPTT correlating in therapeutic range. Hgb 12.3 and plt 150. No s/sx bleeding noted in chart. Heparin to be held Monday 2/5 @ 0000 for ICD placement later that AM.   Goal of Therapy:  Heparin level 0.3-0.7 units/ml aPTT 66-102 seconds Monitor platelets by anticoagulation protocol: Yes   Plan: Continue heparin 1350 units/hr  Monitor daily heparin level and  CBC Monitor for signs/symptoms of bleeding  Hold heparin 2/5 @ [place], PharmD PGY1 Pharmacy Resident   07/16/2022 8:12 AM

## 2022-07-16 NOTE — Progress Notes (Signed)
PROGRESS NOTE                                                                                                                                                                                                             Patient Demographics:    Bryan Guerrero, is a 52 y.o. male, DOB - 09-24-1970, WJX:[id number]  Outpatient Primary MD for the patient is Elsie Stain, MD    LOS - 2  Admit date - 07/13/2022    Chief Complaint  Patient presents with   Abdominal Pain    Pain on and off yesterday and today       Brief Narrative (HPI from H&P)    52 y.o. male with medical history significant of HFrEF, alcoholic cardiomyopathy, hypertension and prior history of alcohol abuse came to ED with complaint of palpitations and racing of heart, had a recent loop recorder placed few weeks ago, in the ER workup suggestive of wide-complex tachycardia along with AKI seen by cardiology and admitted to the hospital for further care.   Subjective:   Patient in bed, appears comfortable, denies any headache, no fever, no chest pain or pressure, no shortness of breath , no abdominal pain. No new focal weakness.    Assessment  & Plan :    Nonspecific wide-complex tachycardia.  A-fib with aberrancy versus V. tach.  Seen by cardiology.  Has a loop recorder which was placed recently, cardiology to evaluate further for now continue amiodarone drip, low-dose Coreg and Entresto as tolerated by blood pressure and monitor on telemetry.  Echo noted with EF now down to 25% from previous EF of 45% in July 2023, cardiac MRI also noted, case discussed with EP physician Dr. Quentin Ore they would likely proceed with AICD placement on 07/18/2022.  Chronic systolic congestive heart failure ([place]) EF around now around 25% down from 45% 6 months ago in July 2023.  Blood pressure improved medications per CHF team, currently no shortness of breath.  Now on lower than home  dose Entresto due to hypotension along with lower than home dose Coreg.  AKI.  Creatinine around 1.1 few months ago.  Discussed with his CHF physician Dr. Aundra Dubin, hold blood pressure medications today, gentle hydration, cut down Entresto Coreg and Aldactone dose from tomorrow if blood pressure tolerates.  Monitor renal function.  Primary hypertension  -Hypotensive  now resolved, continue to monitor on present medication regimen.  Atrial flutter (HCC) - Continue low-dose Coreg from tomorrow as blood pressure tolerates, for now as needed IV Lopressor, continue Eliquis, [place] vas 2 score of 3.        Condition - Fair  Family Communication  :  None  Code Status :  Full  Consults  :  Cards  PUD Prophylaxis :    Procedures  :     TTE - 1. Left ventricular ejection fraction, by estimation, is 20 to 25%. The left ventricle has severely decreased function. The left ventricle has no regional wall motion abnormalities. The left ventricular internal cavity size was moderately dilated. Left ventricular diastolic parameters were normal.  2. Right ventricular systolic function is normal. The right ventricular size is normal. Mildly increased right ventricular wall thickness. There is normal pulmonary artery systolic pressure.  3. Left atrial size was moderately dilated.  4. Right atrial size was mildly dilated.  5. The mitral valve is normal in structure. Trivial mitral valve regurgitation. No evidence of mitral stenosis.  6. The aortic valve is normal in structure. Aortic valve regurgitation is not visualized. No aortic stenosis is present.  7. The inferior vena cava is normal in size with greater than 50% respiratory variability, suggesting right atrial pressure of 3 mmHg  Cardiac MRI - 1. 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 2. RV insertion site LGE, which is a nonspecific scar pattern often seen in setting of elevated  pulmonary pressures 3.  Mild LV dilatation with moderate systolic dysfunction (EF 31%) 4.  Normal RV size with moderate systolic dysfunction (EF 38%)      Disposition Plan  :    Status is: Observation  DVT Prophylaxis  :    Place and maintain sequential compression device Start: 07/14/22 1004    Lab Results  Component Value Date   PLT 150 07/16/2022    Diet :  Diet Order             Diet Heart Room service appropriate? Yes; Fluid consistency: Thin  Diet effective now                    Inpatient Medications  Scheduled Meds:  (feeding supplement) PROSource Plus  30 mL Oral TID BM   amiodarone  200 mg Oral BID   carvedilol  3.125 mg Oral BID   dapagliflozin propanediol  10 mg Oral Daily   feeding supplement  237 mL Oral BID BM   multivitamin with minerals  1 tablet Oral Daily   potassium chloride  40 mEq Oral BID   sacubitril-valsartan  1 tablet Oral BID   spironolactone  12.5 mg Oral Daily   Continuous Infusions:  heparin 1,350 Units/hr (07/15/22 1712)   PRN Meds:.acetaminophen **OR** acetaminophen, ondansetron **OR** ondansetron (ZOFRAN) IV, polyethylene glycol  Antibiotics  :    Anti-infectives (From admission, onward)    None         Objective:   Vitals:   07/15/22 0830 07/15/22 1426 07/15/22 1923 07/16/22 0525  BP: (!) 131/90 119/73 120/88 123/80  Pulse: 61 (!) 54 61 (!) 53  Resp: 15 15 16 17   Temp: 98.3 F (36.8 C) 98.3 F (36.8 C) 98.3 F (36.8 C) 98.3 F (36.8 C)  TempSrc: Oral Oral Oral Oral  SpO2: 98% 99% 99% 99%  Weight:    80.2 kg  Height:  Wt Readings from Last 3 Encounters:  07/16/22 80.2 kg  06/08/22 82.6 kg  02/15/22 85.3 kg     Intake/Output Summary (Last 24 hours) at 07/16/2022 1209 Last data filed at 07/16/2022 0300 Gross per 24 hour  Intake 1145.83 ml  Output --  Net 1145.83 ml     Physical Exam  Awake Alert, No new F.N deficits, Normal affect [place].AT,PERRAL Supple Neck, No JVD,   Symmetrical Chest  wall movement, Good air movement bilaterally, CTAB RRR,No Gallops,Rubs or new Murmurs,  +ve B.Sounds, Abd Soft, No tenderness,   No Cyanosis, Clubbing or edema      Data Review:    Recent Labs  Lab 07/13/22 1901 07/13/22 2157 07/14/22 0416 07/15/22 0551 07/16/22 0036  WBC 7.6  --  7.0 5.0 3.9*  HGB 17.8* 18.0* 14.1 12.6* 12.3*  HCT 51.5 53.0* 41.8 38.1* 37.6*  PLT 247  --  207 177 150  MCV 93.8  --  95.4 96.2 97.9  MCH 32.4  --  32.2 31.8 32.0  MCHC 34.6  --  33.7 33.1 32.7  RDW 13.9  --  12.8 12.7 12.4  LYMPHSABS 1.3  --   --  2.1 1.9  MONOABS 0.5  --   --  0.6 0.5  EOSABS 0.0  --   --  0.1 0.1  BASOSABS 0.0  --   --  0.0 0.0    Recent Labs  Lab 07/13/22 1901 07/13/22 2022 07/13/22 2022 07/13/22 2135 07/13/22 2157 07/13/22 2334 07/14/22 0416 07/15/22 0554 07/16/22 0036  NA  --  137   < > 138 142  --  136 136 137  K  --  SPECIMEN HEMOLYZED. HEMOLYSIS MAY AFFECT INTEGRITY OF RESULTS.   < > 4.0 4.0  --  4.1 3.5 3.5  CL  --  103   < > 108 104  --  103 104 106  CO2  --  23  --  21*  --   --  19* 23 24  ANIONGAP  --  11  --  9  --   --  14 9 7   GLUCOSE  --  99   < > 79 75  --  94 120* 119*  BUN  --  15   < > 14 18  --  18 15 12   CREATININE  --  1.79*   < > 1.68* 1.60*  --  1.71* 1.27* 1.36*  AST  --  SPECIMEN HEMOLYZED. HEMOLYSIS MAY AFFECT INTEGRITY OF RESULTS.  --  28  --   --   --   --   --   ALT  --  SPECIMEN HEMOLYZED. HEMOLYSIS MAY AFFECT INTEGRITY OF RESULTS.  --  16  --   --   --   --   --   ALKPHOS  --  SPECIMEN HEMOLYZED. HEMOLYSIS MAY AFFECT INTEGRITY OF RESULTS.  --  54  --   --   --   --   --   BILITOT  --  SPECIMEN HEMOLYZED. HEMOLYSIS MAY AFFECT INTEGRITY OF RESULTS.  --  1.5*  --   --   --   --   --   ALBUMIN  --  3.6  --  3.4*  --   --   --   --   --   LATICACIDVEN  --   --   --  1.9  --  1.2  --   --   --   INR  --  1.5*  --   --   --   --   --   --   --  TSH  --   --   --  1.317  --   --   --   --   --   BNP 76.9  --   --   --   --   --   --   121.3* 346.9*  MG  --  SPECIMEN HEMOLYZED. HEMOLYSIS MAY AFFECT INTEGRITY OF RESULTS.  --   --   --   --  1.9 2.0 1.9  CALCIUM  --  9.2  --  9.0  --   --  8.6* 8.7* 8.7*   < > = values in this interval not displayed.      Radiology Reports MR CARDIAC MORPHOLOGY W WO CONTRAST  Result Date: 07/14/2022 CLINICAL DATA:  8M with NICM p/w WCT EXAM: CARDIAC MRI TECHNIQUE: The patient was scanned on a 1.5 Tesla Siemens magnet. A dedicated cardiac coil was used. Functional imaging was done using Fiesta sequences. 2,3, and 4 chamber views were done to assess for RWMA's. Modified Simpson's rule using a short axis stack was used to calculate an ejection fraction on a dedicated work [employer], [employer]. The patient received 10 cc of Gadavist. After 10 minutes inversion recovery sequences were used to assess for infiltration and scar tissue. Phase contrast velocity mapping was performed above the aortic and pulmonic valves CONTRAST:  10 cc  of Gadavist FINDINGS: Left ventricle: -Mild dilatation -Moderate systolic dysfunction -Elevated ECV (37%) -Basal septal midwall LGE -RV insertion site LGE LV EF: 31% (Normal 49-79%) Absolute volumes: LV EDV: (Normal 95-215 mL) LV ESV: (Normal 25-85 mL) LV SV: 88mL (Normal 61-145 mL) CO: 3.6L/min (Normal 3.4-7.8 L/min) Indexed volumes: LV EDV: 165mL/sq-m (Normal 50-108 mL/sq-m) LV ESV: 70mL/sq-m (Normal 11-47 mL/sq-m) LV SV: 36mL/sq-m (Normal 33-72 mL/sq-m) CI: 1.8L/min/sq-m (Normal 1.8-4.2 L/min/sq-m) Right ventricle: Normal size with moderate systolic dysfunction RV EF:  38% (Normal 51-80%) Absolute volumes: RV EDV: (Normal 109-217 mL) RV ESV: (Normal 23-91 mL) RV SV: 26mL (Normal 71-141 mL) CO: 3.4L/min (Normal 2.8-8.8 L/min) Indexed volumes: RV EDV: 70mL/sq-m (Normal 58-109 mL/sq-m) RV ESV: 34mL/sq-m (Normal 12-46 mL/sq-m) RV SV: 38mL/sq-m (Normal 38-71 mL/sq-m) CI: 1.7L/min/sq-m (Normal 1.7-4.2 L/min/sq-m) Left atrium: Moderate enlargement Right  atrium: Normal size Mitral valve: Trivial regurgitation Aortic valve: No regurgitation Tricuspid valve: Trivial regurgitation Pulmonic valve: No regurgitation Aorta: Normal proximal ascending aorta Pericardium: Small effusion IMPRESSION: 1. 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 2. RV insertion site LGE, which is a nonspecific scar pattern often seen in setting of elevated pulmonary pressures 3.  Mild LV dilatation with moderate systolic dysfunction (EF 31%) 4.  Normal RV size with moderate systolic dysfunction (EF 38%) Electronically Signed   By: Epifanio Lesches M.D.   On: 07/14/2022 23:31   MR CARDIAC VELOCITY FLOW MAP  Result Date: 07/14/2022 CLINICAL DATA:  8M with NICM p/w WCT EXAM: CARDIAC MRI TECHNIQUE: The patient was scanned on a 1.5 Tesla Siemens magnet. A dedicated cardiac coil was used. Functional imaging was done using Fiesta sequences. 2,3, and 4 chamber views were done to assess for RWMA's. Modified Simpson's rule using a short axis stack was used to calculate an ejection fraction on a dedicated work [employer] officer, [employer]. The patient received 10 cc of Gadavist. After 10 minutes inversion recovery sequences were used to assess for infiltration and scar tissue. Phase contrast velocity mapping was performed above the aortic and pulmonic valves CONTRAST:  10 cc  of Gadavist FINDINGS: Left ventricle: -Mild dilatation -Moderate systolic dysfunction -Elevated ECV (37%) -Basal septal midwall LGE -RV insertion site LGE LV EF: 31% (Normal 49-79%) Absolute volumes: LV EDV: (Normal 95-215 mL) LV ESV: (Normal 25-85 mL) LV SV: 4mL (Normal 61-145 mL) CO: 3.6L/min (Normal 3.4-7.8 L/min) Indexed volumes: LV EDV: 180mL/sq-m (Normal 50-108 mL/sq-m) LV ESV: 42mL/sq-m (Normal 11-47 mL/sq-m) LV SV: 11mL/sq-m (Normal 33-72 mL/sq-m) CI: 1.8L/min/sq-m (Normal 1.8-4.2 L/min/sq-m) Right ventricle: Normal size  with moderate systolic dysfunction RV EF:  38% (Normal 51-80%) Absolute volumes: RV EDV: (Normal 109-217 mL) RV ESV: (Normal 23-91 mL) RV SV: 54mL (Normal 71-141 mL) CO: 3.4L/min (Normal 2.8-8.8 L/min) Indexed volumes: RV EDV: 26mL/sq-m (Normal 58-109 mL/sq-m) RV ESV: 25mL/sq-m (Normal 12-46 mL/sq-m) RV SV: 97mL/sq-m (Normal 38-71 mL/sq-m) CI: 1.7L/min/sq-m (Normal 1.7-4.2 L/min/sq-m) Left atrium: Moderate enlargement Right atrium: Normal size Mitral valve: Trivial regurgitation Aortic valve: No regurgitation Tricuspid valve: Trivial regurgitation Pulmonic valve: No regurgitation Aorta: Normal proximal ascending aorta Pericardium: Small effusion IMPRESSION: 1. 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 2. RV insertion site LGE, which is a nonspecific scar pattern often seen in setting of elevated pulmonary pressures 3.  Mild LV dilatation with moderate systolic dysfunction (EF 31%) 4.  Normal RV size with moderate systolic dysfunction (EF 38%) Electronically Signed   By: Epifanio Lesches M.D.   On: 07/14/2022 23:31   MR CARDIAC VELOCITY FLOW MAP  Result Date: 07/14/2022 CLINICAL DATA:  17M with NICM p/w WCT EXAM: CARDIAC MRI TECHNIQUE: The patient was scanned on a 1.5 Tesla Siemens magnet. A dedicated cardiac coil was used. Functional imaging was done using Fiesta sequences. 2,3, and 4 chamber views were done to assess for RWMA's. Modified Simpson's rule using a short axis stack was used to calculate an ejection fraction on a dedicated work [employer] officer, [employer]. The patient received 10 cc of Gadavist. After 10 minutes inversion recovery sequences were used to assess for infiltration and scar tissue. Phase contrast velocity mapping was performed above the aortic and pulmonic valves CONTRAST:  10 cc  of Gadavist FINDINGS: Left ventricle: -Mild dilatation -Moderate systolic dysfunction -Elevated ECV (37%)  -Basal septal midwall LGE -RV insertion site LGE LV EF: 31% (Normal 49-79%) Absolute volumes: LV EDV: (Normal 95-215 mL) LV ESV: (Normal 25-85 mL) LV SV: 34mL (Normal 61-145 mL) CO: 3.6L/min (Normal 3.4-7.8 L/min) Indexed volumes: LV EDV: 122mL/sq-m (Normal 50-108 mL/sq-m) LV ESV: 41mL/sq-m (Normal 11-47 mL/sq-m) LV SV: 7mL/sq-m (Normal 33-72 mL/sq-m) CI: 1.8L/min/sq-m (Normal 1.8-4.2 L/min/sq-m) Right ventricle: Normal size with moderate systolic dysfunction RV EF:  38% (Normal 51-80%) Absolute volumes: RV EDV: (Normal 109-217 mL) RV ESV: (Normal 23-91 mL) RV SV: 52mL (Normal 71-141 mL) CO: 3.4L/min (Normal 2.8-8.8 L/min) Indexed volumes: RV EDV: 76mL/sq-m (Normal 58-109 mL/sq-m) RV ESV: 61mL/sq-m (Normal 12-46 mL/sq-m) RV SV: 74mL/sq-m (Normal 38-71 mL/sq-m) CI: 1.7L/min/sq-m (Normal 1.7-4.2 L/min/sq-m) Left atrium: Moderate enlargement Right atrium: Normal size Mitral valve: Trivial regurgitation Aortic valve: No regurgitation Tricuspid valve: Trivial regurgitation Pulmonic valve: No regurgitation Aorta: Normal proximal ascending aorta Pericardium: Small effusion IMPRESSION: 1. 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 2. RV insertion site LGE, which is a nonspecific scar pattern often seen in setting of elevated pulmonary pressures 3.  Mild LV dilatation with moderate systolic dysfunction (EF 31%) 4.  Normal RV size with moderate systolic dysfunction (EF  38%) Electronically Signed   By: Epifanio Lesches M.D.   On: 07/14/2022 23:31   ECHOCARDIOGRAM COMPLETE  Result Date: 07/14/2022    ECHOCARDIOGRAM REPORT   Patient Name:   Bryan Guerrero Date of Exam: 07/14/2022 Medical Rec #:  [id number]       Height:       72.0 in Accession #:    [id number]      Weight:       180.0 lb Date of Birth:  14-Jun-1970      BSA:          2.037 m Patient Age:    51 years        BP:           108/85 mmHg Patient Gender: M                HR:           62 bpm. Exam Location:  Inpatient Procedure: 2D Echo, Cardiac Doppler and Color Doppler Indications:    congestive heart failure  History:        Patient has prior history of Echocardiogram examinations, most                 recent 12/21/2021. CHF and Cardiomyopathy, Arrythmias:Atrial                 Flutter, Signs/Symptoms:Dyspnea; Risk Factors:Hypertension and                 Current Smoker.  Sonographer:    Delcie Roch RDCS Referring Phys: [id number] BELAL A SULEIMAN IMPRESSIONS  1. Left ventricular ejection fraction, by estimation, is 20 to 25%. The left ventricle has severely decreased function. The left ventricle has no regional wall motion abnormalities. The left ventricular internal cavity size was moderately dilated. Left ventricular diastolic parameters were normal.  2. Right ventricular systolic function is normal. The right ventricular size is normal. Mildly increased right ventricular wall thickness. There is normal pulmonary artery systolic pressure.  3. Left atrial size was moderately dilated.  4. Right atrial size was mildly dilated.  5. The mitral valve is normal in structure. Trivial mitral valve regurgitation. No evidence of mitral stenosis.  6. The aortic valve is normal in structure. Aortic valve regurgitation is not visualized. No aortic stenosis is present.  7. The inferior vena cava is normal in size with greater than 50% respiratory variability, suggesting right atrial pressure of 3 mmHg. FINDINGS  Left Ventricle: Left ventricular ejection fraction, by estimation, is 20 to 25%. The left ventricle has severely decreased function. The left ventricle has no regional wall motion abnormalities. The left ventricular internal cavity size was moderately dilated. There is no left ventricular hypertrophy. Left ventricular diastolic parameters were normal. Right Ventricle: The right ventricular size is normal. Mildly increased right ventricular wall thickness. Right  ventricular systolic function is normal. There is normal pulmonary artery systolic pressure. The tricuspid regurgitant velocity is 2.00 m/s, and with an assumed right atrial pressure of 3 mmHg, the estimated right ventricular systolic pressure is 19.0 mmHg. Left Atrium: Left atrial size was moderately dilated. Right Atrium: Right atrial size was mildly dilated. Pericardium: There is no evidence of pericardial effusion. Mitral Valve: The mitral valve is normal in structure. Trivial mitral valve regurgitation. No evidence of mitral valve stenosis. Tricuspid Valve: The tricuspid valve is normal in structure. Tricuspid valve regurgitation is not demonstrated. No evidence of tricuspid stenosis. Aortic Valve: The aortic valve is normal in structure. Aortic valve regurgitation  is not visualized. No aortic stenosis is present. Pulmonic Valve: The pulmonic valve was normal in structure. Pulmonic valve regurgitation is not visualized. No evidence of pulmonic stenosis. Aorta: The aortic root is normal in size and structure. Venous: The inferior vena cava is normal in size with greater than 50% respiratory variability, suggesting right atrial pressure of 3 mmHg. IAS/Shunts: No atrial level shunt detected by color flow Doppler.  LEFT VENTRICLE PLAX 2D LVIDd:         6.10 cm      Diastology LVIDs:         5.00 cm      LV e' medial:    9.46 cm/s LV PW:         1.00 cm      LV E/e' medial:  7.5 LV IVS:        0.80 cm      LV e' lateral:   10.80 cm/s LVOT diam:     2.00 cm      LV E/e' lateral: 6.6 LV SV:         55 LV SV Index:   27 LVOT Area:     3.14 cm  LV Volumes (MOD) LV vol d, MOD A4C: 110.0 ml LV vol s, MOD A4C: 77.0 ml LV SV MOD A4C:     110.0 ml RIGHT VENTRICLE             IVC RV Basal diam:  3.30 cm     IVC diam: 1.80 cm RV S prime:     10.10 cm/s TAPSE (M-mode): 2.1 cm LEFT ATRIUM             Index        RIGHT ATRIUM           Index LA diam:        3.80 cm 1.87 cm/m   RA Area:     14.30 cm LA Vol (A2C):   82.5 ml  40.50 ml/m  RA Volume:   36.60 ml  17.97 ml/m LA Vol (A4C):   77.5 ml 38.04 ml/m LA Biplane Vol: 83.7 ml 41.08 ml/m  AORTIC VALVE LVOT Vmax:   79.60 cm/s LVOT Vmean:  51.700 cm/s LVOT VTI:    0.176 m  AORTA Ao Root diam: 3.20 cm MITRAL VALVE               TRICUSPID VALVE MV Area (PHT): 4.57 cm    TR Peak grad:   16.0 mmHg MV Decel Time: 166 msec    TR Vmax:        200.00 cm/s MV E velocity: 71.10 cm/s MV A velocity: 25.30 cm/s  SHUNTS MV E/A ratio:  2.81        Systemic VTI:  0.18 m                            Systemic Diam: 2.00 cm Arvilla Meres MD Electronically signed by Arvilla Meres MD Signature Date/Time: 07/14/2022/5:30:27 PM    Final    DG Chest Port 1 View  Result Date: 07/13/2022 CLINICAL DATA:  Shortness of breath. EXAM: PORTABLE CHEST 1 VIEW COMPARISON:  09/15/2021 FINDINGS: Borderline/mild cardiomegaly. Normal mediastinal contours. Presumed loop recorder in the left chest wall. No pulmonary edema, focal airspace disease or pleural effusion. No pneumothorax. No acute osseous findings. IMPRESSION: Borderline/mild cardiomegaly. No acute pulmonary process. Electronically Signed   By: Narda Rutherford M.D.   On: 07/13/2022 19:14  Signature  -   Lala Lund M.D on 07/16/2022 at 12:09 PM   -  To page go to [email or web address]

## 2022-07-16 NOTE — Progress Notes (Signed)
Rounding Note    Patient Name: Bryan Guerrero Date of Encounter: 07/16/2022  [place] Cardiologist: Sanda Klein, MD   Subjective   No chest pain or sob.   Inpatient Medications    Scheduled Meds:  (feeding supplement) PROSource Plus  30 mL Oral TID BM   carvedilol  3.125 mg Oral BID   dapagliflozin propanediol  10 mg Oral Daily   feeding supplement  237 mL Oral BID BM   multivitamin with minerals  1 tablet Oral Daily   potassium chloride  40 mEq Oral BID   sacubitril-valsartan  1 tablet Oral BID   spironolactone  12.5 mg Oral Daily   Continuous Infusions:  amiodarone 30 mg/hr (07/15/22 1424)   heparin 1,350 Units/hr (07/15/22 1712)   PRN Meds: acetaminophen **OR** acetaminophen, ondansetron **OR** ondansetron (ZOFRAN) IV, polyethylene glycol   Vital Signs    Vitals:   07/15/22 0830 07/15/22 1426 07/15/22 1923 07/16/22 0525  BP: (!) 131/90 119/73 120/88 123/80  Pulse: 61 (!) 54 61 (!) 53  Resp: 15 15 16 17   Temp: 98.3 F (36.8 C) 98.3 F (36.8 C) 98.3 F (36.8 C) 98.3 F (36.8 C)  TempSrc: Oral Oral Oral Oral  SpO2: 98% 99% 99% 99%  Weight:    80.2 kg  Height:        Intake/Output Summary (Last 24 hours) at 07/16/2022 1049 Last data filed at 07/16/2022 0300 Gross per 24 hour  Intake 1145.83 ml  Output --  Net 1145.83 ml      07/16/2022    5:25 AM 07/15/2022    5:52 AM 07/14/2022    2:45 PM  Last 3 Weights  Weight (lbs) 176 lb 14.4 oz 175 lb 9.6 oz 175 lb 8 oz  Weight (kg) 80.241 kg 79.652 kg 79.606 kg      Telemetry    nsr - Personally Reviewed  ECG    nsr - Personally Reviewed  Physical Exam   GEN: No acute distress.   Neck: No JVD Cardiac: RRR, no murmurs, rubs, or gallops.  Respiratory: Clear to auscultation bilaterally. GI: Soft, nontender, non-distended  MS: No edema; No deformity. Neuro:  Nonfocal  Psych: Normal affect   Labs    High Sensitivity Troponin:   Recent Labs  Lab 07/13/22 2022 07/13/22 2135   TROPONINIHS 47* 60*     Chemistry Recent Labs  Lab 07/13/22 2022 07/13/22 2135 07/13/22 2157 07/14/22 0416 07/15/22 0554 07/16/22 0036  NA 137 138   < > 136 136 137  K SPECIMEN HEMOLYZED. HEMOLYSIS MAY AFFECT INTEGRITY OF RESULTS. 4.0   < > 4.1 3.5 3.5  CL 103 108   < > 103 104 106  CO2 23 21*  --  19* 23 24  GLUCOSE 99 79   < > 94 120* 119*  BUN 15 14   < > 18 15 12   CREATININE 1.79* 1.68*   < > 1.71* 1.27* 1.36*  CALCIUM 9.2 9.0  --  8.6* 8.7* 8.7*  MG SPECIMEN HEMOLYZED. HEMOLYSIS MAY AFFECT INTEGRITY OF RESULTS.  --   --  1.9 2.0 1.9  PROT 5.8* 6.1*  --   --   --   --   ALBUMIN 3.6 3.4*  --   --   --   --   AST SPECIMEN HEMOLYZED. HEMOLYSIS MAY AFFECT INTEGRITY OF RESULTS. 28  --   --   --   --   ALT SPECIMEN HEMOLYZED. HEMOLYSIS MAY AFFECT INTEGRITY OF RESULTS. 16  --   --   --   --  ALKPHOS SPECIMEN HEMOLYZED. HEMOLYSIS MAY AFFECT INTEGRITY OF RESULTS. 54  --   --   --   --   BILITOT SPECIMEN HEMOLYZED. HEMOLYSIS MAY AFFECT INTEGRITY OF RESULTS. 1.5*  --   --   --   --   GFRNONAA 46* 50*  --  48* >60 >60  ANIONGAP 11 9  --  14 9 7    < > = values in this interval not displayed.    Lipids No results for input(s): "CHOL", "TRIG", "HDL", "LABVLDL", "LDLCALC", "CHOLHDL" in the last 168 hours.  Hematology Recent Labs  Lab 07/14/22 0416 07/15/22 0551 07/16/22 0036  WBC 7.0 5.0 3.9*  RBC 4.38 3.96* 3.84*  HGB 14.1 12.6* 12.3*  HCT 41.8 38.1* 37.6*  MCV 95.4 96.2 97.9  MCH 32.2 31.8 32.0  MCHC 33.7 33.1 32.7  RDW 12.8 12.7 12.4  PLT 207 177 150   Thyroid  Recent Labs  Lab 07/13/22 2135  TSH 1.317    BNP Recent Labs  Lab 07/13/22 1901 07/15/22 0554 07/16/22 0036  BNP 76.9 121.3* 346.9*    DDimer No results for input(s): "DDIMER" in the last 168 hours.   Radiology    MR CARDIAC MORPHOLOGY W WO CONTRAST  Result Date: 07/14/2022 CLINICAL DATA:  52M with NICM p/w WCT EXAM: CARDIAC MRI TECHNIQUE: The patient was scanned on a 1.5 Tesla Siemens magnet. A  dedicated cardiac coil was used. Functional imaging was done using Fiesta sequences. 2,3, and 4 chamber views were done to assess for RWMA's. Modified Simpson's rule using a short axis stack was used to calculate an ejection fraction on a dedicated work Conservation officer, nature. The patient received 10 cc of Gadavist. After 10 minutes inversion recovery sequences were used to assess for infiltration and scar tissue. Phase contrast velocity mapping was performed above the aortic and pulmonic valves CONTRAST:  10 cc  of Gadavist FINDINGS: Left ventricle: -Mild dilatation -Moderate systolic dysfunction -Elevated ECV (37%) -Basal septal midwall LGE -RV insertion site LGE LV EF: 31% (Normal 49-79%) Absolute volumes: LV EDV: 25mL (Normal 95-215 mL) LV ESV: 146mL (Normal 25-85 mL) LV SV: 21mL (Normal 61-145 mL) CO: 3.6L/min (Normal 3.4-7.8 L/min) Indexed volumes: LV EDV: 168mL/sq-m (Normal 50-108 mL/sq-m) LV ESV: 42mL/sq-m (Normal 11-47 mL/sq-m) LV SV: 90mL/sq-m (Normal 33-72 mL/sq-m) CI: 1.8L/min/sq-m (Normal 1.8-4.2 L/min/sq-m) Right ventricle: Normal size with moderate systolic dysfunction RV EF:  38% (Normal 51-80%) Absolute volumes: RV EDV: 12mL (Normal 109-217 mL) RV ESV: 171mL (Normal 23-91 mL) RV SV: 4mL (Normal 71-141 mL) CO: 3.4L/min (Normal 2.8-8.8 L/min) Indexed volumes: RV EDV: 97mL/sq-m (Normal 58-109 mL/sq-m) RV ESV: 44mL/sq-m (Normal 12-46 mL/sq-m) RV SV: 56mL/sq-m (Normal 38-71 mL/sq-m) CI: 1.7L/min/sq-m (Normal 1.7-4.2 L/min/sq-m) Left atrium: Moderate enlargement Right atrium: Normal size Mitral valve: Trivial regurgitation Aortic valve: No regurgitation Tricuspid valve: Trivial regurgitation Pulmonic valve: No regurgitation Aorta: Normal proximal ascending aorta Pericardium: Small effusion IMPRESSION: 1. 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 2. RV insertion site LGE, which is a nonspecific scar pattern often  seen in setting of elevated pulmonary pressures 3.  Mild LV dilatation with moderate systolic dysfunction (EF 25%) 4.  Normal RV size with moderate systolic dysfunction (EF 95%) Electronically Signed   By: Oswaldo Milian M.D.   On: 07/14/2022 23:31   MR CARDIAC VELOCITY FLOW MAP  Result Date: 07/14/2022 CLINICAL DATA:  52M with NICM p/w WCT EXAM: CARDIAC MRI TECHNIQUE: The patient was scanned on a  1.5 Tesla Siemens magnet. A dedicated cardiac coil was used. Functional imaging was done using Fiesta sequences. 2,3, and 4 chamber views were done to assess for RWMA's. Modified Simpson's rule using a short axis stack was used to calculate an ejection fraction on a dedicated work Conservation officer, nature. The patient received 10 cc of Gadavist. After 10 minutes inversion recovery sequences were used to assess for infiltration and scar tissue. Phase contrast velocity mapping was performed above the aortic and pulmonic valves CONTRAST:  10 cc  of Gadavist FINDINGS: Left ventricle: -Mild dilatation -Moderate systolic dysfunction -Elevated ECV (37%) -Basal septal midwall LGE -RV insertion site LGE LV EF: 31% (Normal 49-79%) Absolute volumes: LV EDV: 261mL (Normal 95-215 mL) LV ESV: 110mL (Normal 25-85 mL) LV SV: 66mL (Normal 61-145 mL) CO: 3.6L/min (Normal 3.4-7.8 L/min) Indexed volumes: LV EDV: 193mL/sq-m (Normal 50-108 mL/sq-m) LV ESV: 77mL/sq-m (Normal 11-47 mL/sq-m) LV SV: 56mL/sq-m (Normal 33-72 mL/sq-m) CI: 1.8L/min/sq-m (Normal 1.8-4.2 L/min/sq-m) Right ventricle: Normal size with moderate systolic dysfunction RV EF:  38% (Normal 51-80%) Absolute volumes: RV EDV: 175mL (Normal 109-217 mL) RV ESV: 142mL (Normal 23-91 mL) RV SV: 59mL (Normal 71-141 mL) CO: 3.4L/min (Normal 2.8-8.8 L/min) Indexed volumes: RV EDV: 60mL/sq-m (Normal 58-109 mL/sq-m) RV ESV: 71mL/sq-m (Normal 12-46 mL/sq-m) RV SV: 63mL/sq-m (Normal 38-71 mL/sq-m) CI: 1.7L/min/sq-m (Normal 1.7-4.2 L/min/sq-m) Left atrium: Moderate enlargement  Right atrium: Normal size Mitral valve: Trivial regurgitation Aortic valve: No regurgitation Tricuspid valve: Trivial regurgitation Pulmonic valve: No regurgitation Aorta: Normal proximal ascending aorta Pericardium: Small effusion IMPRESSION: 1. 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 2. RV insertion site LGE, which is a nonspecific scar pattern often seen in setting of elevated pulmonary pressures 3.  Mild LV dilatation with moderate systolic dysfunction (EF 58%) 4.  Normal RV size with moderate systolic dysfunction (EF 52%) Electronically Signed   By: Oswaldo Milian M.D.   On: 07/14/2022 23:31   MR CARDIAC VELOCITY FLOW MAP  Result Date: 07/14/2022 CLINICAL DATA:  77M with NICM p/w WCT EXAM: CARDIAC MRI TECHNIQUE: The patient was scanned on a 1.5 Tesla Siemens magnet. A dedicated cardiac coil was used. Functional imaging was done using Fiesta sequences. 2,3, and 4 chamber views were done to assess for RWMA's. Modified Simpson's rule using a short axis stack was used to calculate an ejection fraction on a dedicated work Conservation officer, nature. The patient received 10 cc of Gadavist. After 10 minutes inversion recovery sequences were used to assess for infiltration and scar tissue. Phase contrast velocity mapping was performed above the aortic and pulmonic valves CONTRAST:  10 cc  of Gadavist FINDINGS: Left ventricle: -Mild dilatation -Moderate systolic dysfunction -Elevated ECV (37%) -Basal septal midwall LGE -RV insertion site LGE LV EF: 31% (Normal 49-79%) Absolute volumes: LV EDV: 231mL (Normal 95-215 mL) LV ESV: 139mL (Normal 25-85 mL) LV SV: 42mL (Normal 61-145 mL) CO: 3.6L/min (Normal 3.4-7.8 L/min) Indexed volumes: LV EDV: 19mL/sq-m (Normal 50-108 mL/sq-m) LV ESV: 49mL/sq-m (Normal 11-47 mL/sq-m) LV SV: 70mL/sq-m (Normal 33-72 mL/sq-m) CI: 1.8L/min/sq-m (Normal 1.8-4.2 L/min/sq-m) Right ventricle: Normal  size with moderate systolic dysfunction RV EF:  38% (Normal 51-80%) Absolute volumes: RV EDV: 14mL (Normal 109-217 mL) RV ESV: 147mL (Normal 23-91 mL) RV SV: 63mL (Normal 71-141 mL) CO: 3.4L/min (Normal 2.8-8.8 L/min) Indexed volumes: RV EDV: 72mL/sq-m (Normal 58-109 mL/sq-m) RV ESV: 75mL/sq-m (Normal 12-46 mL/sq-m) RV SV: 51mL/sq-m (Normal 38-71 mL/sq-m) CI: 1.7L/min/sq-m (Normal 1.7-4.2 L/min/sq-m) Left atrium:  Moderate enlargement Right atrium: Normal size Mitral valve: Trivial regurgitation Aortic valve: No regurgitation Tricuspid valve: Trivial regurgitation Pulmonic valve: No regurgitation Aorta: Normal proximal ascending aorta Pericardium: Small effusion IMPRESSION: 1. 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 2. RV insertion site LGE, which is a nonspecific scar pattern often seen in setting of elevated pulmonary pressures 3.  Mild LV dilatation with moderate systolic dysfunction (EF 31%) 4.  Normal RV size with moderate systolic dysfunction (EF 38%) Electronically Signed   By: Epifanio Lesches M.D.   On: 07/14/2022 23:31    Cardiac Studies   See above  Patient Profile     52 y.o. male admitted with VT, chf.   Assessment & Plan    VT - he is maintaining NSR. He will continue amio. ICD in 2 days. Chronic systolic heart failure - he appears to be euvolemic.    For questions or updates, please contact [facility] Please consult [email or web address] for contact info under   Signed, Lewayne Bunting, MD  07/16/2022, 10:49 AM

## 2022-07-17 DIAGNOSIS — R000 Tachycardia, unspecified: Secondary | ICD-10-CM | POA: Diagnosis not present

## 2022-07-17 LAB — CBC WITH DIFFERENTIAL/PLATELET
Abs Immature Granulocytes: 0.01 10*3/uL (ref 0.00–0.07)
Basophils Absolute: 0 10*3/uL (ref 0.0–0.1)
Basophils Relative: 1 %
Eosinophils Absolute: 0.1 10*3/uL (ref 0.0–0.5)
Eosinophils Relative: 2 %
HCT: 40.8 % (ref 39.0–52.0)
Hemoglobin: 13.1 g/dL (ref 13.0–17.0)
Immature Granulocytes: 0 %
Lymphocytes Relative: 38 %
Lymphs Abs: 1.7 10*3/uL (ref 0.7–4.0)
MCH: 31.5 pg (ref 26.0–34.0)
MCHC: 32.1 g/dL (ref 30.0–36.0)
MCV: 98.1 fL (ref 80.0–100.0)
Monocytes Absolute: 0.5 10*3/uL (ref 0.1–1.0)
Monocytes Relative: 11 %
Neutro Abs: 2.1 10*3/uL (ref 1.7–7.7)
Neutrophils Relative %: 48 %
Platelets: 158 10*3/uL (ref 150–400)
RBC: 4.16 MIL/uL — ABNORMAL LOW (ref 4.22–5.81)
RDW: 12.5 % (ref 11.5–15.5)
WBC: 4.3 10*3/uL (ref 4.0–10.5)
nRBC: 0 % (ref 0.0–0.2)

## 2022-07-17 LAB — BASIC METABOLIC PANEL
Anion gap: 6 (ref 5–15)
BUN: 15 mg/dL (ref 6–20)
CO2: 25 mmol/L (ref 22–32)
Calcium: 8.9 mg/dL (ref 8.9–10.3)
Chloride: 106 mmol/L (ref 98–111)
Creatinine, Ser: 1.18 mg/dL (ref 0.61–1.24)
GFR, Estimated: >60 mL/min (ref >60)
Glucose, Bld: 93 mg/dL (ref 70–99)
Potassium: 4 mmol/L (ref 3.5–5.1)
Sodium: 137 mmol/L (ref 135–145)

## 2022-07-17 LAB — GLUCOSE, CAPILLARY: Glucose-Capillary: 98 mg/dL (ref 70–99)

## 2022-07-17 LAB — HEPARIN LEVEL (UNFRACTIONATED): Heparin Unfractionated: 0.38 IU/mL (ref 0.30–0.70)

## 2022-07-17 LAB — MAGNESIUM: Magnesium: 2.1 mg/dL (ref 1.7–2.4)

## 2022-07-17 NOTE — Progress Notes (Signed)
Rounding Note    Patient Name: Bryan Guerrero Date of Encounter: 07/17/2022  [place] Cardiologist: Sanda Klein, MD Quentin Ore, MD  Subjective   No chest pain or sob.   Inpatient Medications    Scheduled Meds:  (feeding supplement) PROSource Plus  30 mL Oral TID BM   amiodarone  200 mg Oral BID   carvedilol  3.125 mg Oral BID   dapagliflozin propanediol  10 mg Oral Daily   feeding supplement  237 mL Oral BID BM   multivitamin with minerals  1 tablet Oral Daily   sacubitril-valsartan  1 tablet Oral BID   spironolactone  12.5 mg Oral Daily   Continuous Infusions:  heparin 1,350 Units/hr (07/17/22 0847)   PRN Meds: acetaminophen **OR** acetaminophen, ondansetron **OR** ondansetron (ZOFRAN) IV, polyethylene glycol   Vital Signs    Vitals:   07/16/22 1215 07/16/22 1945 07/17/22 0500 07/17/22 0851  BP: (!) 139/96 117/80 123/81 124/82  Pulse: (!) 50 64 (!) 52 (!) 57  Resp: 18 18 18    Temp: 98.2 F (36.8 C) 98.5 F (36.9 C) 98 F (36.7 C) 98.1 F (36.7 C)  TempSrc: Oral Oral Oral Oral  SpO2: 99% 100% 99% 97%  Weight:   79.8 kg   Height:        Intake/Output Summary (Last 24 hours) at 07/17/2022 0917 Last data filed at 07/16/2022 2100 Gross per 24 hour  Intake 830.84 ml  Output --  Net 830.84 ml      07/17/2022    5:00 AM 07/16/2022    5:25 AM 07/15/2022    5:52 AM  Last 3 Weights  Weight (lbs) 176 lb 176 lb 14.4 oz 175 lb 9.6 oz  Weight (kg) 79.833 kg 80.241 kg 79.652 kg      Telemetry    nsr - Personally Reviewed  ECG    none - Personally Reviewed  Physical Exam   GEN: No acute distress.   Neck: No JVD Cardiac: RRR, no murmurs, rubs, or gallops.  Respiratory: Clear to auscultation bilaterally. GI: Soft, nontender, non-distended  MS: No edema; No deformity. Neuro:  Nonfocal  Psych: Normal affect   Labs    High Sensitivity Troponin:   Recent Labs  Lab 07/13/22 2022 07/13/22 2135  TROPONINIHS 47* 60*     Chemistry Recent Labs   Lab 07/13/22 2022 07/13/22 2135 07/13/22 2157 07/15/22 0554 07/16/22 0036 07/17/22 0607  NA 137 138   < > 136 137 137  K SPECIMEN HEMOLYZED. HEMOLYSIS MAY AFFECT INTEGRITY OF RESULTS. 4.0   < > 3.5 3.5 4.0  CL 103 108   < > 104 106 106  CO2 23 21*   < > 23 24 25   GLUCOSE 99 79   < > 120* 119* 93  BUN 15 14   < > 15 12 15   CREATININE 1.79* 1.68*   < > 1.27* 1.36* 1.18  CALCIUM 9.2 9.0   < > 8.7* 8.7* 8.9  MG SPECIMEN HEMOLYZED. HEMOLYSIS MAY AFFECT INTEGRITY OF RESULTS.  --    < > 2.0 1.9 2.1  PROT 5.8* 6.1*  --   --   --   --   ALBUMIN 3.6 3.4*  --   --   --   --   AST SPECIMEN HEMOLYZED. HEMOLYSIS MAY AFFECT INTEGRITY OF RESULTS. 28  --   --   --   --   ALT SPECIMEN HEMOLYZED. HEMOLYSIS MAY AFFECT INTEGRITY OF RESULTS. 16  --   --   --   --  ALKPHOS SPECIMEN HEMOLYZED. HEMOLYSIS MAY AFFECT INTEGRITY OF RESULTS. 54  --   --   --   --   BILITOT SPECIMEN HEMOLYZED. HEMOLYSIS MAY AFFECT INTEGRITY OF RESULTS. 1.5*  --   --   --   --   GFRNONAA 46* 50*   < > >60 >60 >60  ANIONGAP 11 9   < > 9 7 6    < > = values in this interval not displayed.    Lipids No results for input(s): "CHOL", "TRIG", "HDL", "LABVLDL", "LDLCALC", "CHOLHDL" in the last 168 hours.  Hematology Recent Labs  Lab 07/15/22 0551 07/16/22 0036 07/17/22 0607  WBC 5.0 3.9* 4.3  RBC 3.96* 3.84* 4.16*  HGB 12.6* 12.3* 13.1  HCT 38.1* 37.6* 40.8  MCV 96.2 97.9 98.1  MCH 31.8 32.0 31.5  MCHC 33.1 32.7 32.1  RDW 12.7 12.4 12.5  PLT 177 150 158   Thyroid  Recent Labs  Lab 07/13/22 2135  TSH 1.317    BNP Recent Labs  Lab 07/13/22 1901 07/15/22 0554 07/16/22 0036  BNP 76.9 121.3* 346.9*    DDimer No results for input(s): "DDIMER" in the last 168 hours.   Radiology    No results found.  Cardiac Studies   none  Patient Profile     52 y.o. male admitted with VT in the setting of chronic systolic heart failure.  Assessment & Plan    VT - he has quieted down on IV amio, now on oral. He will  undergo ICD insertion tomorrow pm. Chronic systolic heart failure - he appears to be euvolemic on exam. We will follow.    For questions or updates, please contact [place] Please consult [email or web address] for contact info under   Signed, Cristopher Peru, MD  07/17/2022, 9:17 AM

## 2022-07-17 NOTE — Plan of Care (Signed)

## 2022-07-17 NOTE — Progress Notes (Signed)
[facility] for Eliquis > Heparin Indication: atrial fibrillation  No Known Allergies  Patient Measurements: Height: 6' (182.9 cm) Weight: 79.8 kg (176 lb) IBW/kg (Calculated) : 77.6 Heparin Dosing Weight: 81.6  Vital Signs: Temp: 98.1 F (36.7 C) (02/04 0851) Temp Source: Oral (02/04 0851) BP: 124/82 (02/04 0851) Pulse Rate: 57 (02/04 0851)  Labs: Recent Labs    07/15/22 0551 07/15/22 0554 07/15/22 1637 07/16/22 0036 07/17/22 0607  HGB 12.6*  --   --  12.3* 13.1  HCT 38.1*  --   --  37.6* 40.8  PLT 177  --   --  150 158  APTT  --  48* 68* 80*  --   HEPARINUNFRC  --   --   --  0.35 0.38  CREATININE  --  1.27*  --  1.36* 1.18    Estimated Creatinine Clearance: 83.1 mL/min (by C-G formula based on SCr of 1.18 mg/dL).   Medical History: Past Medical History:  Diagnosis Date   Alcohol abuse    Asthma    Cardiomyopathy ([place])    Dyspnea    Hypertension     Assessment: 86 YOM with atrial fibrillation on Eliquis PTA now holding Eliquis for possible ICD placement. Last dose Eliquis 2/1 AM at 0819. Pharmacy consulted for heparin.  Heparin level 0.38 on drip rate 1350 units/hr therapeutic. Hgb 13.1 and plt 158. No s/sx bleeding noted in chart. Heparin to be held tonight 2/5 @ 0000 for ICD placement tomorrow morning.    Goal of Therapy:  Heparin level 0.3-0.7 units/ml aPTT 66-102 seconds Monitor platelets by anticoagulation protocol: Yes   Plan: Continue heparin 1350 units/hr  Monitor daily heparin level and CBC Monitor for signs/symptoms of bleeding  Hold heparin tonight 2/5 @ 0000 F/u plan to return to PTA apixaban when procedures complete   Gena Fray, PharmD PGY1 Pharmacy Resident   07/17/2022 10:26 AM

## 2022-07-17 NOTE — Progress Notes (Signed)
PROGRESS NOTE                                                                                                                                                                                                             Patient Demographics:    Bryan Guerrero, is a 52 y.o. male, DOB - 1970-09-13, BSJ:[id number]  Outpatient Primary MD for the patient is Elsie Stain, MD    LOS - 3  Admit date - 07/13/2022    Chief Complaint  Patient presents with   Abdominal Pain    Pain on and off yesterday and today       Brief Narrative (HPI from H&P)    52 y.o. male with medical history significant of HFrEF, alcoholic cardiomyopathy, hypertension and prior history of alcohol abuse came to ED with complaint of palpitations and racing of heart, had a recent loop recorder placed few weeks ago, in the ER workup suggestive of wide-complex tachycardia along with AKI seen by cardiology and admitted to the hospital for further care.   Subjective:   Patient in bed, appears comfortable, denies any headache, no fever, no chest pain or pressure, no shortness of breath , no abdominal pain. No new focal weakness.   Assessment  & Plan :    Nonspecific wide-complex tachycardia.  A-fib with aberrancy versus V. tach.  Seen by cardiology.  Has a loop recorder which was placed recently, cardiology to evaluate further for now continue amiodarone drip, low-dose Coreg and Entresto as tolerated by blood pressure and monitor on telemetry.  Echo noted with EF now down to 25% from previous EF of 45% in July 2023, cardiac MRI also noted, case discussed with EP physician Dr. Quentin Ore they would likely proceed with AICD placement on 07/18/2022.  Chronic systolic congestive heart failure ([place]) EF around now around 25% down from 45% 6 months ago in July 2023.  Blood pressure improved medications per CHF team, currently no shortness of breath.  Now on lower than home dose  Entresto due to hypotension along with lower than home dose Coreg.  AKI.  Creatinine around 1.1 few months ago.  Discussed with his CHF physician Dr. Aundra Dubin, hold blood pressure medications today, gentle hydration, cut down Entresto Coreg and Aldactone dose from tomorrow if blood pressure tolerates.  Monitor renal function.  Primary hypertension  -Hypotensive now  resolved, continue to monitor on present medication regimen.  Atrial flutter (HCC) - Continue low-dose Coreg from tomorrow as blood pressure tolerates, for now as needed IV Lopressor, continue Eliquis, [place] vas 2 score of 3.        Condition - Fair  Family Communication  :  None  Code Status :  Full  Consults  :  Cards  PUD Prophylaxis :    Procedures  :     TTE - 1. Left ventricular ejection fraction, by estimation, is 20 to 25%. The left ventricle has severely decreased function. The left ventricle has no regional wall motion abnormalities. The left ventricular internal cavity size was moderately dilated. Left ventricular diastolic parameters were normal.  2. Right ventricular systolic function is normal. The right ventricular size is normal. Mildly increased right ventricular wall thickness. There is normal pulmonary artery systolic pressure.  3. Left atrial size was moderately dilated.  4. Right atrial size was mildly dilated.  5. The mitral valve is normal in structure. Trivial mitral valve regurgitation. No evidence of mitral stenosis.  6. The aortic valve is normal in structure. Aortic valve regurgitation is not visualized. No aortic stenosis is present.  7. The inferior vena cava is normal in size with greater than 50% respiratory variability, suggesting right atrial pressure of 3 mmHg  Cardiac MRI - 1. 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 2. RV insertion site LGE, which is a nonspecific scar pattern often seen in setting of elevated  pulmonary pressures 3.  Mild LV dilatation with moderate systolic dysfunction (EF 31%) 4.  Normal RV size with moderate systolic dysfunction (EF 38%)      Disposition Plan  :    Status is: Observation  DVT Prophylaxis  :    Place and maintain sequential compression device Start: 07/14/22 1004    Lab Results  Component Value Date   PLT 158 07/17/2022    Diet :  Diet Order             Diet Heart Room service appropriate? Yes; Fluid consistency: Thin  Diet effective now                    Inpatient Medications  Scheduled Meds:  (feeding supplement) PROSource Plus  30 mL Oral TID BM   amiodarone  200 mg Oral BID   carvedilol  3.125 mg Oral BID   dapagliflozin propanediol  10 mg Oral Daily   feeding supplement  237 mL Oral BID BM   multivitamin with minerals  1 tablet Oral Daily   sacubitril-valsartan  1 tablet Oral BID   spironolactone  12.5 mg Oral Daily   Continuous Infusions:  heparin 1,350 Units/hr (07/16/22 2047)   PRN Meds:.acetaminophen **OR** acetaminophen, ondansetron **OR** ondansetron (ZOFRAN) IV, polyethylene glycol  Antibiotics  :    Anti-infectives (From admission, onward)    None         Objective:   Vitals:   07/16/22 0525 07/16/22 1215 07/16/22 1945 07/17/22 0500  BP: 123/80 (!) 139/96 117/80 123/81  Pulse: (!) 53 (!) 50 64 (!) 52  Resp: 17 18 18 18   Temp: 98.3 F (36.8 C) 98.2 F (36.8 C) 98.5 F (36.9 C) 98 F (36.7 C)  TempSrc: Oral Oral Oral Oral  SpO2: 99% 99% 100% 99%  Weight: 80.2 kg   79.8 kg  Height:        Wt Readings from Last  3 Encounters:  07/17/22 79.8 kg  06/08/22 82.6 kg  02/15/22 85.3 kg     Intake/Output Summary (Last 24 hours) at 07/17/2022 0806 Last data filed at 07/16/2022 2100 Gross per 24 hour  Intake 1070.84 ml  Output --  Net 1070.84 ml     Physical Exam  Awake Alert, No new F.N deficits, Normal affect [place].AT,PERRAL Supple Neck, No JVD,   Symmetrical Chest wall movement, Good air  movement bilaterally, CTAB RRR,No Gallops,Rubs or new Murmurs,  +ve B.Sounds, Abd Soft, No tenderness,   No Cyanosis, Clubbing or edema      Data Review:    Recent Labs  Lab 07/13/22 1901 07/13/22 2157 07/14/22 0416 07/15/22 0551 07/16/22 0036 07/17/22 0607  WBC 7.6  --  7.0 5.0 3.9* 4.3  HGB 17.8* 18.0* 14.1 12.6* 12.3* 13.1  HCT 51.5 53.0* 41.8 38.1* 37.6* 40.8  PLT 247  --  207 177 150 158  MCV 93.8  --  95.4 96.2 97.9 98.1  MCH 32.4  --  32.2 31.8 32.0 31.5  MCHC 34.6  --  33.7 33.1 32.7 32.1  RDW 13.9  --  12.8 12.7 12.4 12.5  LYMPHSABS 1.3  --   --  2.1 1.9 1.7  MONOABS 0.5  --   --  0.6 0.5 0.5  EOSABS 0.0  --   --  0.1 0.1 0.1  BASOSABS 0.0  --   --  0.0 0.0 0.0    Recent Labs  Lab 07/13/22 1901 07/13/22 2022 07/13/22 2022 07/13/22 2135 07/13/22 2157 07/13/22 2334 07/14/22 0416 07/15/22 0554 07/16/22 0036 07/17/22 0607  NA  --  137   < > 138 142  --  136 136 137 137  K  --  SPECIMEN HEMOLYZED. HEMOLYSIS MAY AFFECT INTEGRITY OF RESULTS.   < > 4.0 4.0  --  4.1 3.5 3.5 4.0  CL  --  103   < > 108 104  --  103 104 106 106  CO2  --  23   < > 21*  --   --  19* 23 24 25   ANIONGAP  --  11   < > 9  --   --  14 9 7 6   GLUCOSE  --  99   < > 79 75  --  94 120* 119* 93  BUN  --  15   < > 14 18  --  18 15 12 15   CREATININE  --  1.79*   < > 1.68* 1.60*  --  1.71* 1.27* 1.36* 1.18  AST  --  SPECIMEN HEMOLYZED. HEMOLYSIS MAY AFFECT INTEGRITY OF RESULTS.  --  28  --   --   --   --   --   --   ALT  --  SPECIMEN HEMOLYZED. HEMOLYSIS MAY AFFECT INTEGRITY OF RESULTS.  --  16  --   --   --   --   --   --   ALKPHOS  --  SPECIMEN HEMOLYZED. HEMOLYSIS MAY AFFECT INTEGRITY OF RESULTS.  --  54  --   --   --   --   --   --   BILITOT  --  SPECIMEN HEMOLYZED. HEMOLYSIS MAY AFFECT INTEGRITY OF RESULTS.  --  1.5*  --   --   --   --   --   --   ALBUMIN  --  3.6  --  3.4*  --   --   --   --   --   --  LATICACIDVEN  --   --   --  1.9  --  1.2  --   --   --   --   INR  --  1.5*  --   --    --   --   --   --   --   --   TSH  --   --   --  1.317  --   --   --   --   --   --   BNP 76.9  --   --   --   --   --   --  121.3* 346.9*  --   MG  --  SPECIMEN HEMOLYZED. HEMOLYSIS MAY AFFECT INTEGRITY OF RESULTS.  --   --   --   --  1.9 2.0 1.9 2.1  CALCIUM  --  9.2   < > 9.0  --   --  8.6* 8.7* 8.7* 8.9   < > = values in this interval not displayed.      Radiology Reports MR CARDIAC MORPHOLOGY W WO CONTRAST  Result Date: 07/14/2022 CLINICAL DATA:  73M with NICM p/w WCT EXAM: CARDIAC MRI TECHNIQUE: The patient was scanned on a 1.5 Tesla Siemens magnet. A dedicated cardiac coil was used. Functional imaging was done using Fiesta sequences. 2,3, and 4 chamber views were done to assess for RWMA's. Modified Simpson's rule using a short axis stack was used to calculate an ejection fraction on a dedicated work [employer] officer, [employer]. The patient received 10 cc of Gadavist. After 10 minutes inversion recovery sequences were used to assess for infiltration and scar tissue. Phase contrast velocity mapping was performed above the aortic and pulmonic valves CONTRAST:  10 cc  of Gadavist FINDINGS: Left ventricle: -Mild dilatation -Moderate systolic dysfunction -Elevated ECV (37%) -Basal septal midwall LGE -RV insertion site LGE LV EF: 31% (Normal 49-79%) Absolute volumes: LV EDV: (Normal 95-215 mL) LV ESV: (Normal 25-85 mL) LV SV: 48mL (Normal 61-145 mL) CO: 3.6L/min (Normal 3.4-7.8 L/min) Indexed volumes: LV EDV: 127mL/sq-m (Normal 50-108 mL/sq-m) LV ESV: 37mL/sq-m (Normal 11-47 mL/sq-m) LV SV: 14mL/sq-m (Normal 33-72 mL/sq-m) CI: 1.8L/min/sq-m (Normal 1.8-4.2 L/min/sq-m) Right ventricle: Normal size with moderate systolic dysfunction RV EF:  38% (Normal 51-80%) Absolute volumes: RV EDV: (Normal 109-217 mL) RV ESV: (Normal 23-91 mL) RV SV: 28mL (Normal 71-141 mL) CO: 3.4L/min (Normal 2.8-8.8 L/min) Indexed volumes: RV EDV: 49mL/sq-m (Normal 58-109 mL/sq-m) RV ESV: 48mL/sq-m (Normal  12-46 mL/sq-m) RV SV: 34mL/sq-m (Normal 38-71 mL/sq-m) CI: 1.7L/min/sq-m (Normal 1.7-4.2 L/min/sq-m) Left atrium: Moderate enlargement Right atrium: Normal size Mitral valve: Trivial regurgitation Aortic valve: No regurgitation Tricuspid valve: Trivial regurgitation Pulmonic valve: No regurgitation Aorta: Normal proximal ascending aorta Pericardium: Small effusion IMPRESSION: 1. 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 2. RV insertion site LGE, which is a nonspecific scar pattern often seen in setting of elevated pulmonary pressures 3.  Mild LV dilatation with moderate systolic dysfunction (EF 31%) 4.  Normal RV size with moderate systolic dysfunction (EF 38%) Electronically Signed   By: Epifanio Lesches M.D.   On: 07/14/2022 23:31   MR CARDIAC VELOCITY FLOW MAP  Result Date: 07/14/2022 CLINICAL DATA:  73M with NICM p/w WCT EXAM: CARDIAC MRI TECHNIQUE: The patient was scanned on a 1.5 Tesla Siemens magnet. A dedicated cardiac coil was used. Functional imaging was done using Fiesta sequences. 2,3, and 4 chamber views were  done to assess for RWMA's. Modified Simpson's rule using a short axis stack was used to calculate an ejection fraction on a dedicated work Conservation officer, nature. The patient received 10 cc of Gadavist. After 10 minutes inversion recovery sequences were used to assess for infiltration and scar tissue. Phase contrast velocity mapping was performed above the aortic and pulmonic valves CONTRAST:  10 cc  of Gadavist FINDINGS: Left ventricle: -Mild dilatation -Moderate systolic dysfunction -Elevated ECV (37%) -Basal septal midwall LGE -RV insertion site LGE LV EF: 31% (Normal 49-79%) Absolute volumes: LV EDV: 271mL (Normal 95-215 mL) LV ESV: 176mL (Normal 25-85 mL) LV SV: 22mL (Normal 61-145 mL) CO: 3.6L/min (Normal 3.4-7.8 L/min) Indexed volumes: LV EDV: 145mL/sq-m (Normal 50-108 mL/sq-m) LV ESV: 95mL/sq-m  (Normal 11-47 mL/sq-m) LV SV: 21mL/sq-m (Normal 33-72 mL/sq-m) CI: 1.8L/min/sq-m (Normal 1.8-4.2 L/min/sq-m) Right ventricle: Normal size with moderate systolic dysfunction RV EF:  38% (Normal 51-80%) Absolute volumes: RV EDV: 193mL (Normal 109-217 mL) RV ESV: 160mL (Normal 23-91 mL) RV SV: 8mL (Normal 71-141 mL) CO: 3.4L/min (Normal 2.8-8.8 L/min) Indexed volumes: RV EDV: 50mL/sq-m (Normal 58-109 mL/sq-m) RV ESV: 70mL/sq-m (Normal 12-46 mL/sq-m) RV SV: 30mL/sq-m (Normal 38-71 mL/sq-m) CI: 1.7L/min/sq-m (Normal 1.7-4.2 L/min/sq-m) Left atrium: Moderate enlargement Right atrium: Normal size Mitral valve: Trivial regurgitation Aortic valve: No regurgitation Tricuspid valve: Trivial regurgitation Pulmonic valve: No regurgitation Aorta: Normal proximal ascending aorta Pericardium: Small effusion IMPRESSION: 1. 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 2. RV insertion site LGE, which is a nonspecific scar pattern often seen in setting of elevated pulmonary pressures 3.  Mild LV dilatation with moderate systolic dysfunction (EF 31%) 4.  Normal RV size with moderate systolic dysfunction (EF 51%) Electronically Signed   By: Oswaldo Milian M.D.   On: 07/14/2022 23:31   MR CARDIAC VELOCITY FLOW MAP  Result Date: 07/14/2022 CLINICAL DATA:  60M with NICM p/w WCT EXAM: CARDIAC MRI TECHNIQUE: The patient was scanned on a 1.5 Tesla Siemens magnet. A dedicated cardiac coil was used. Functional imaging was done using Fiesta sequences. 2,3, and 4 chamber views were done to assess for RWMA's. Modified Simpson's rule using a short axis stack was used to calculate an ejection fraction on a dedicated work Conservation officer, nature. The patient received 10 cc of Gadavist. After 10 minutes inversion recovery sequences were used to assess for infiltration and scar tissue. Phase contrast velocity mapping was performed above the aortic and  pulmonic valves CONTRAST:  10 cc  of Gadavist FINDINGS: Left ventricle: -Mild dilatation -Moderate systolic dysfunction -Elevated ECV (37%) -Basal septal midwall LGE -RV insertion site LGE LV EF: 31% (Normal 49-79%) Absolute volumes: LV EDV: 271mL (Normal 95-215 mL) LV ESV: 15mL (Normal 25-85 mL) LV SV: 46mL (Normal 61-145 mL) CO: 3.6L/min (Normal 3.4-7.8 L/min) Indexed volumes: LV EDV: 152mL/sq-m (Normal 50-108 mL/sq-m) LV ESV: 55mL/sq-m (Normal 11-47 mL/sq-m) LV SV: 25mL/sq-m (Normal 33-72 mL/sq-m) CI: 1.8L/min/sq-m (Normal 1.8-4.2 L/min/sq-m) Right ventricle: Normal size with moderate systolic dysfunction RV EF:  38% (Normal 51-80%) Absolute volumes: RV EDV: 152mL (Normal 109-217 mL) RV ESV: 158mL (Normal 23-91 mL) RV SV: 54mL (Normal 71-141 mL) CO: 3.4L/min (Normal 2.8-8.8 L/min) Indexed volumes: RV EDV: 69mL/sq-m (Normal 58-109 mL/sq-m) RV ESV: 30mL/sq-m (Normal 12-46 mL/sq-m) RV SV: 67mL/sq-m (Normal 38-71 mL/sq-m) CI: 1.7L/min/sq-m (Normal 1.7-4.2 L/min/sq-m) Left atrium: Moderate enlargement Right atrium: Normal size Mitral valve: Trivial regurgitation Aortic valve: No regurgitation Tricuspid valve: Trivial regurgitation Pulmonic valve: No regurgitation Aorta:  Normal proximal ascending aorta Pericardium: Small effusion IMPRESSION: 1. 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 2. RV insertion site LGE, which is a nonspecific scar pattern often seen in setting of elevated pulmonary pressures 3.  Mild LV dilatation with moderate systolic dysfunction (EF 31%) 4.  Normal RV size with moderate systolic dysfunction (EF 38%) Electronically Signed   By: Epifanio Lesches M.D.   On: 07/14/2022 23:31   ECHOCARDIOGRAM COMPLETE  Result Date: 07/14/2022    ECHOCARDIOGRAM REPORT   Patient Name:   Bryan Guerrero Date of Exam: 07/14/2022 Medical Rec #:  [id number]       Height:       72.0 in Accession #:    [id number]      Weight:        180.0 lb Date of Birth:  May 24, 1971      BSA:          2.037 m Patient Age:    51 years        BP:           108/85 mmHg Patient Gender: M               HR:           62 bpm. Exam Location:  Inpatient Procedure: 2D Echo, Cardiac Doppler and Color Doppler Indications:    congestive heart failure  History:        Patient has prior history of Echocardiogram examinations, most                 recent 12/21/2021. CHF and Cardiomyopathy, Arrythmias:Atrial                 Flutter, Signs/Symptoms:Dyspnea; Risk Factors:Hypertension and                 Current Smoker.  Sonographer:    Delcie Roch RDCS Referring Phys: [id number] BELAL A SULEIMAN IMPRESSIONS  1. Left ventricular ejection fraction, by estimation, is 20 to 25%. The left ventricle has severely decreased function. The left ventricle has no regional wall motion abnormalities. The left ventricular internal cavity size was moderately dilated. Left ventricular diastolic parameters were normal.  2. Right ventricular systolic function is normal. The right ventricular size is normal. Mildly increased right ventricular wall thickness. There is normal pulmonary artery systolic pressure.  3. Left atrial size was moderately dilated.  4. Right atrial size was mildly dilated.  5. The mitral valve is normal in structure. Trivial mitral valve regurgitation. No evidence of mitral stenosis.  6. The aortic valve is normal in structure. Aortic valve regurgitation is not visualized. No aortic stenosis is present.  7. The inferior vena cava is normal in size with greater than 50% respiratory variability, suggesting right atrial pressure of 3 mmHg. FINDINGS  Left Ventricle: Left ventricular ejection fraction, by estimation, is 20 to 25%. The left ventricle has severely decreased function. The left ventricle has no regional wall motion abnormalities. The left ventricular internal cavity size was moderately dilated. There is no left ventricular hypertrophy. Left ventricular diastolic  parameters were normal. Right Ventricle: The right ventricular size is normal. Mildly increased right ventricular wall thickness. Right ventricular systolic function is normal. There is normal pulmonary artery systolic pressure. The tricuspid regurgitant velocity is 2.00 m/s, and with an assumed right atrial pressure of 3 mmHg, the estimated right ventricular systolic pressure is 19.0 mmHg. Left Atrium: Left atrial size was moderately dilated.  Right Atrium: Right atrial size was mildly dilated. Pericardium: There is no evidence of pericardial effusion. Mitral Valve: The mitral valve is normal in structure. Trivial mitral valve regurgitation. No evidence of mitral valve stenosis. Tricuspid Valve: The tricuspid valve is normal in structure. Tricuspid valve regurgitation is not demonstrated. No evidence of tricuspid stenosis. Aortic Valve: The aortic valve is normal in structure. Aortic valve regurgitation is not visualized. No aortic stenosis is present. Pulmonic Valve: The pulmonic valve was normal in structure. Pulmonic valve regurgitation is not visualized. No evidence of pulmonic stenosis. Aorta: The aortic root is normal in size and structure. Venous: The inferior vena cava is normal in size with greater than 50% respiratory variability, suggesting right atrial pressure of 3 mmHg. IAS/Shunts: No atrial level shunt detected by color flow Doppler.  LEFT VENTRICLE PLAX 2D LVIDd:         6.10 cm      Diastology LVIDs:         5.00 cm      LV e' medial:    9.46 cm/s LV PW:         1.00 cm      LV E/e' medial:  7.5 LV IVS:        0.80 cm      LV e' lateral:   10.80 cm/s LVOT diam:     2.00 cm      LV E/e' lateral: 6.6 LV SV:         55 LV SV Index:   27 LVOT Area:     3.14 cm  LV Volumes (MOD) LV vol d, MOD A4C: 110.0 ml LV vol s, MOD A4C: 77.0 ml LV SV MOD A4C:     110.0 ml RIGHT VENTRICLE             IVC RV Basal diam:  3.30 cm     IVC diam: 1.80 cm RV S prime:     10.10 cm/s TAPSE (M-mode): 2.1 cm LEFT ATRIUM              Index        RIGHT ATRIUM           Index LA diam:        3.80 cm 1.87 cm/m   RA Area:     14.30 cm LA Vol (A2C):   82.5 ml 40.50 ml/m  RA Volume:   36.60 ml  17.97 ml/m LA Vol (A4C):   77.5 ml 38.04 ml/m LA Biplane Vol: 83.7 ml 41.08 ml/m  AORTIC VALVE LVOT Vmax:   79.60 cm/s LVOT Vmean:  51.700 cm/s LVOT VTI:    0.176 m  AORTA Ao Root diam: 3.20 cm MITRAL VALVE               TRICUSPID VALVE MV Area (PHT): 4.57 cm    TR Peak grad:   16.0 mmHg MV Decel Time: 166 msec    TR Vmax:        200.00 cm/s MV E velocity: 71.10 cm/s MV A velocity: 25.30 cm/s  SHUNTS MV E/A ratio:  2.81        Systemic VTI:  0.18 m                            Systemic Diam: 2.00 cm Arvilla Meres MD Electronically signed by Arvilla Meres MD Signature Date/Time: 07/14/2022/5:30:27 PM    Final    DG Chest [facility]  Result Date: 07/13/2022 CLINICAL DATA:  Shortness of breath. EXAM: PORTABLE CHEST 1 VIEW COMPARISON:  09/15/2021 FINDINGS: Borderline/mild cardiomegaly. Normal mediastinal contours. Presumed loop recorder in the left chest wall. No pulmonary edema, focal airspace disease or pleural effusion. No pneumothorax. No acute osseous findings. IMPRESSION: Borderline/mild cardiomegaly. No acute pulmonary process. Electronically Signed   By: Keith Rake M.D.   On: 07/13/2022 19:14      Signature  -   Lala Lund M.D on 07/17/2022 at 8:06 AM   -  To page go to [email or web address]

## 2022-07-18 ENCOUNTER — Other Ambulatory Visit (HOSPITAL_COMMUNITY): Payer: Self-pay

## 2022-07-18 ENCOUNTER — Other Ambulatory Visit (HOSPITAL_COMMUNITY): Payer: Self-pay | Admitting: Cardiology

## 2022-07-18 ENCOUNTER — Encounter (HOSPITAL_COMMUNITY)
Admission: EM | Disposition: A | Discharge status: Home / Self Care | Payer: Self-pay | Source: Home / Self Care | Attending: Internal Medicine

## 2022-07-18 DIAGNOSIS — R000 Tachycardia, unspecified: Secondary | ICD-10-CM | POA: Diagnosis not present

## 2022-07-18 DIAGNOSIS — I4729 Other ventricular tachycardia: Secondary | ICD-10-CM | POA: Diagnosis not present

## 2022-07-18 DIAGNOSIS — I472 Ventricular tachycardia, unspecified: Secondary | ICD-10-CM

## 2022-07-18 DIAGNOSIS — I5041 Acute combined systolic (congestive) and diastolic (congestive) heart failure: Secondary | ICD-10-CM

## 2022-07-18 HISTORY — PX: LOOP RECORDER REMOVAL: EP1215

## 2022-07-18 HISTORY — PX: ICD IMPLANT: EP1208

## 2022-07-18 LAB — CBC WITH DIFFERENTIAL/PLATELET
Abs Immature Granulocytes: 0.01 10*3/uL (ref 0.00–0.07)
Basophils Absolute: 0 10*3/uL (ref 0.0–0.1)
Basophils Relative: 1 %
Eosinophils Absolute: 0.1 10*3/uL (ref 0.0–0.5)
Eosinophils Relative: 1 %
HCT: 40.9 % (ref 39.0–52.0)
Hemoglobin: 13.5 g/dL (ref 13.0–17.0)
Immature Granulocytes: 0 %
Lymphocytes Relative: 35 %
Lymphs Abs: 1.6 10*3/uL (ref 0.7–4.0)
MCH: 31.7 pg (ref 26.0–34.0)
MCHC: 33 g/dL (ref 30.0–36.0)
MCV: 96 fL (ref 80.0–100.0)
Monocytes Absolute: 0.5 10*3/uL (ref 0.1–1.0)
Monocytes Relative: 10 %
Neutro Abs: 2.4 10*3/uL (ref 1.7–7.7)
Neutrophils Relative %: 53 %
Platelets: 160 10*3/uL (ref 150–400)
RBC: 4.26 MIL/uL (ref 4.22–5.81)
RDW: 12.3 % (ref 11.5–15.5)
WBC: 4.6 10*3/uL (ref 4.0–10.5)
nRBC: 0 % (ref 0.0–0.2)

## 2022-07-18 LAB — BASIC METABOLIC PANEL
Anion gap: 8 (ref 5–15)
BUN: 17 mg/dL (ref 6–20)
CO2: 25 mmol/L (ref 22–32)
Calcium: 9.4 mg/dL (ref 8.9–10.3)
Chloride: 102 mmol/L (ref 98–111)
Creatinine, Ser: 1.2 mg/dL (ref 0.61–1.24)
GFR, Estimated: >60 mL/min (ref >60)
Glucose, Bld: 85 mg/dL (ref 70–99)
Potassium: 4.5 mmol/L (ref 3.5–5.1)
Sodium: 135 mmol/L (ref 135–145)

## 2022-07-18 LAB — SURGICAL PCR SCREEN
MRSA, PCR: NEGATIVE
Staphylococcus aureus: NEGATIVE

## 2022-07-18 LAB — GLUCOSE, CAPILLARY: Glucose-Capillary: 99 mg/dL (ref 70–99)

## 2022-07-18 LAB — MAGNESIUM: Magnesium: 2 mg/dL (ref 1.7–2.4)

## 2022-07-18 LAB — HEPARIN LEVEL (UNFRACTIONATED): Heparin Unfractionated: 0.19 IU/mL — ABNORMAL LOW (ref 0.30–0.70)

## 2022-07-18 SURGERY — ICD IMPLANT

## 2022-07-18 MED ORDER — CEFAZOLIN SODIUM-DEXTROSE 2-4 GM/100ML-% IV SOLN
INTRAVENOUS | Status: AC
  Filled 2022-07-18: qty 100

## 2022-07-18 MED ORDER — SPIRONOLACTONE 25 MG PO TABS
12.5000 mg | ORAL_TABLET | Freq: Every day | ORAL | 0 refills | Status: DC
  Filled 2022-07-18: qty 30, 60d supply, fill #0

## 2022-07-18 MED ORDER — LIDOCAINE HCL (PF) 1 % IJ SOLN
INTRAMUSCULAR | Status: AC
  Filled 2022-07-18: qty 30

## 2022-07-18 MED ORDER — MIDAZOLAM HCL 5 MG/5ML IJ SOLN
INTRAMUSCULAR | Status: AC
  Filled 2022-07-18: qty 5

## 2022-07-18 MED ORDER — LIDOCAINE HCL (PF) 1 % IJ SOLN
INTRAMUSCULAR | Status: DC | PRN
  Administered 2022-07-18: 50 mL
  Administered 2022-07-18: 20 mL

## 2022-07-18 MED ORDER — LIDOCAINE HCL (PF) 1 % IJ SOLN
INTRAMUSCULAR | Status: AC
  Filled 2022-07-18: qty 60

## 2022-07-18 MED ORDER — SACUBITRIL-VALSARTAN 24-26 MG PO TABS
1.0000 | ORAL_TABLET | Freq: Two times a day (BID) | ORAL | 0 refills | Status: DC
  Filled 2022-07-18: qty 60, 30d supply, fill #0

## 2022-07-18 MED ORDER — FENTANYL CITRATE (PF) 100 MCG/2ML IJ SOLN
INTRAMUSCULAR | Status: AC
  Filled 2022-07-18: qty 2

## 2022-07-18 MED ORDER — CHLORHEXIDINE GLUCONATE 4 % EX LIQD
60.0000 mL | Freq: Once | CUTANEOUS | Status: AC
  Administered 2022-07-18: 4 via TOPICAL
  Filled 2022-07-18: qty 15

## 2022-07-18 MED ORDER — SODIUM CHLORIDE 0.9 % IV SOLN
INTRAVENOUS | Status: AC
  Filled 2022-07-18: qty 2

## 2022-07-18 MED ORDER — SODIUM CHLORIDE 0.9 % IV SOLN: INTRAVENOUS | Status: DC

## 2022-07-18 MED ORDER — MIDAZOLAM HCL 5 MG/5ML IJ SOLN
INTRAMUSCULAR | Status: DC | PRN
  Administered 2022-07-18 (×2): 1 mg via INTRAVENOUS

## 2022-07-18 MED ORDER — AMIODARONE HCL 200 MG PO TABS
200.0000 mg | ORAL_TABLET | Freq: Two times a day (BID) | ORAL | 0 refills | Status: DC
  Filled 2022-07-18: qty 180, 90d supply, fill #0

## 2022-07-18 MED ORDER — MIDAZOLAM HCL 2 MG/2ML IJ SOLN
INTRAMUSCULAR | Status: AC
  Filled 2022-07-18: qty 2

## 2022-07-18 MED ORDER — FENTANYL CITRATE (PF) 100 MCG/2ML IJ SOLN
INTRAMUSCULAR | Status: DC | PRN
  Administered 2022-07-18 (×2): 25 ug via INTRAVENOUS

## 2022-07-18 MED ORDER — HEPARIN (PORCINE) IN NACL 1000-0.9 UT/500ML-% IV SOLN
INTRAVENOUS | Status: DC | PRN
  Administered 2022-07-18: 500 mL

## 2022-07-18 MED ORDER — CEFAZOLIN SODIUM-DEXTROSE 2-4 GM/100ML-% IV SOLN
2.0000 g | INTRAVENOUS | Status: AC
  Administered 2022-07-18: 2 g via INTRAVENOUS

## 2022-07-18 MED ORDER — CARVEDILOL 3.125 MG PO TABS
3.1250 mg | ORAL_TABLET | Freq: Two times a day (BID) | ORAL | 0 refills | Status: DC
  Filled 2022-07-18: qty 60, 30d supply, fill #0

## 2022-07-18 MED ORDER — SODIUM CHLORIDE 0.9 % IV SOLN
80.0000 mg | INTRAVENOUS | Status: AC
  Administered 2022-07-18: 80 mg
  Filled 2022-07-18: qty 2

## 2022-07-18 MED ORDER — CHLORHEXIDINE GLUCONATE 4 % EX LIQD
60.0000 mL | Freq: Once | CUTANEOUS | Status: AC
  Administered 2022-07-18: 4 via TOPICAL
  Filled 2022-07-18: qty 60

## 2022-07-18 SURGICAL SUPPLY — 10 items
CABLE SURGICAL S-101-97-12 (CABLE) ×1 IMPLANT
ICD VIGILANT DR D233 (Pacemaker) IMPLANT
LEAD INGEVITY 7841 52 (Lead) IMPLANT
LEAD RELIANCE 0673 IMPLANT
PACK LOOP INSERTION (CUSTOM PROCEDURE TRAY) ×1 IMPLANT
PAD DEFIB RADIO PHYSIO CONN (PAD) ×1 IMPLANT
SHEATH 7FR PRELUDE SNAP 13 (SHEATH) IMPLANT
SHEATH 8FR PRELUDE SNAP 13 (SHEATH) IMPLANT
SHEATH PROBE COVER 6X72 (BAG) IMPLANT
TRAY PACEMAKER INSERTION (PACKS) ×1 IMPLANT

## 2022-07-18 NOTE — Progress Notes (Signed)
Patient asleep. Respirations even and unlabored. No signs of distress.  

## 2022-07-18 NOTE — Plan of Care (Signed)
                                      [facility]                            [address] [place], [place] [address]      Bryan Guerrero was admitted to the Hospital on 07/13/2022 and Discharged  07/18/2022 and should be excused from work/school   for 14  days starting from date -  07/13/2022 , may return to work/school without any restrictions.  Call Lala Lund MD, [facility]  [phone] with questions.  Lala Lund M.D on 07/18/2022,at 9:23 AM  [facility]   Office  [phone]

## 2022-07-18 NOTE — Discharge Instructions (Signed)
Follow with Primary MD Elsie Stain, MD in 7 days .  Follow-up post AICD instructions as given by the cardiologist.  Get CBC, CMP, 2 view Chest X ray -  checked next visit with your primary MD   Activity: As tolerated with Full fall precautions use walker/cane & assistance as needed  Disposition Home    Diet: Heart Healthy  Check your Weight same time everyday, if you gain over 2 pounds, or you develop in leg swelling, experience more shortness of breath or chest pain, call your Primary MD immediately. Follow Cardiac Low Salt Diet and 1.5 lit/day fluid restriction.  Special Instructions: If you have smoked or chewed Tobacco  in the last 2 yrs please stop smoking, stop any regular Alcohol  and or any Recreational drug use.  On your next visit with your primary care physician please Get Medicines reviewed and adjusted.  Please request your Prim.MD to go over all Hospital Tests and Procedure/Radiological results at the follow up, please get all Hospital records sent to your Prim MD by signing hospital release before you go home.  If you experience worsening of your admission symptoms, develop shortness of breath, life threatening emergency, suicidal or homicidal thoughts you must seek medical attention immediately by calling 911 or calling your MD immediately  if symptoms less severe.  You Must read complete instructions/literature along with all the possible adverse reactions/side effects for all the Medicines you take and that have been prescribed to you. Take any new Medicines after you have completely understood and accpet all the possible adverse reactions/side effects.        Supplemental Discharge Instructions for  Pacemaker/Defibrillator Patients    Activity No heavy lifting or vigorous activity with your left/right arm for 6 to 8 weeks.  Do not raise your left/right arm above your head for one week.  Gradually raise your affected arm as drawn below.              07/23/22                        07/24/22                       07/25/22                     07/26/22 __  NO DRIVING for  1 week   ; you may begin driving on  4/40/10   .  WOUND CARE Keep the wound area clean and dry.  Do not get this area wet , no showers for one week; you may shower on  07/26/22   . The tape/steri-strips on your wound will fall off; do not pull them off.  No bandage is needed on the site.  DO  NOT apply any creams, oils, or ointments to the wound area. If you notice any drainage or discharge from the wound, any swelling or bruising at the site, or you develop a fever > 101? F after you are discharged home, call the office at once.  Special Instructions You are still able to use cellular telephones; use the ear opposite the side where you have your pacemaker/defibrillator.  Avoid carrying your cellular phone near your device. When traveling through airports, show security personnel your identification card to avoid being screened in the metal detectors.  Ask the security personnel to use the hand wand. Avoid arc welding equipment, MRI testing (magnetic resonance imaging), TENS  units (transcutaneous nerve stimulators).  Call the office for questions about other devices. Avoid electrical appliances that are in poor condition or are not properly grounded. Microwave ovens are safe to be near or to operate.  Additional information for defibrillator patients should your device go off: If your device goes off ONCE and you feel fine afterward, notify the device clinic nurses. If your device goes off ONCE and you do not feel well afterward, call 911. If your device goes off TWICE, call 911. If your device goes off THREE times in one day, call 911.  DO NOT DRIVE YOURSELF OR A FAMILY MEMBER WITH A DEFIBRILLATOR TO THE HOSPITAL--CALL 911.

## 2022-07-18 NOTE — Progress Notes (Signed)
Rounding Note    Patient Name: Bryan Guerrero Date of Encounter: 07/18/2022  [facility] Cardiologist: Sanda Klein, MD  EP: Dr. Quentin Ore  Subjective   No CP, no SOB, no follow up questions regarding the planned procedure  Inpatient Medications    Scheduled Meds:  (feeding supplement) PROSource Plus  30 mL Oral TID BM   amiodarone  200 mg Oral BID   carvedilol  3.125 mg Oral BID   chlorhexidine  60 mL Topical Once   chlorhexidine  60 mL Topical Once   dapagliflozin propanediol  10 mg Oral Daily   feeding supplement  237 mL Oral BID BM   gentamicin (GARAMYCIN) 80 mg in sodium chloride 0.9 % 500 mL irrigation  80 mg Irrigation On Call   multivitamin with minerals  1 tablet Oral Daily   sacubitril-valsartan  1 tablet Oral BID   spironolactone  12.5 mg Oral Daily   Continuous Infusions:  sodium chloride     sodium chloride      ceFAZolin (ANCEF) IV     PRN Meds: acetaminophen **OR** acetaminophen, ondansetron **OR** ondansetron (ZOFRAN) IV, polyethylene glycol   Vital Signs    Vitals:   07/17/22 1542 07/17/22 1922 07/17/22 2152 07/18/22 0435  BP: (!) 138/95 (!) 138/101 (!) 132/91   Pulse: (!) 52 77    Resp:  16 16   Temp: 98.1 F (36.7 C) 98 F (36.7 C) 98 F (36.7 C)   TempSrc: Oral Oral Oral   SpO2: 100% 100% 100%   Weight:    79.1 kg  Height:        Intake/Output Summary (Last 24 hours) at 07/18/2022 0743 Last data filed at 07/17/2022 1537 Gross per 24 hour  Intake --  Output 600 ml  Net -600 ml      07/18/2022    4:35 AM 07/17/2022    5:00 AM 07/16/2022    5:25 AM  Last 3 Weights  Weight (lbs) 174 lb 6.1 oz 176 lb 176 lb 14.4 oz  Weight (kg) 79.1 kg 79.833 kg 80.241 kg      Telemetry    SB/SR 50's-60's - Personally Reviewed  ECG    No new EKGs - Personally Reviewed  Physical Exam   GEN: No acute distress.   Neck: No JVD Cardiac: RRR, no murmurs, rubs, or gallops.  Respiratory: CTA b/l. GI: Soft, nontender, non-distended  MS: No  edema; No deformity. Neuro:  Nonfocal  Psych: Normal affect   Labs    High Sensitivity Troponin:   Recent Labs  Lab 07/13/22 2022 07/13/22 2135  TROPONINIHS 47* 60*     Chemistry Recent Labs  Lab 07/13/22 2022 07/13/22 2135 07/13/22 2157 07/16/22 0036 07/17/22 0607 07/18/22 0026  NA 137 138   < > 137 137 135  K SPECIMEN HEMOLYZED. HEMOLYSIS MAY AFFECT INTEGRITY OF RESULTS. 4.0   < > 3.5 4.0 4.5  CL 103 108   < > 106 106 102  CO2 23 21*   < > 24 25 25   GLUCOSE 99 79   < > 119* 93 85  BUN 15 14   < > 12 15 17   CREATININE 1.79* 1.68*   < > 1.36* 1.18 1.20  CALCIUM 9.2 9.0   < > 8.7* 8.9 9.4  MG SPECIMEN HEMOLYZED. HEMOLYSIS MAY AFFECT INTEGRITY OF RESULTS.  --    < > 1.9 2.1 2.0  PROT 5.8* 6.1*  --   --   --   --  ALBUMIN 3.6 3.4*  --   --   --   --   AST SPECIMEN HEMOLYZED. HEMOLYSIS MAY AFFECT INTEGRITY OF RESULTS. 28  --   --   --   --   ALT SPECIMEN HEMOLYZED. HEMOLYSIS MAY AFFECT INTEGRITY OF RESULTS. 16  --   --   --   --   ALKPHOS SPECIMEN HEMOLYZED. HEMOLYSIS MAY AFFECT INTEGRITY OF RESULTS. 54  --   --   --   --   BILITOT SPECIMEN HEMOLYZED. HEMOLYSIS MAY AFFECT INTEGRITY OF RESULTS. 1.5*  --   --   --   --   GFRNONAA 46* 50*   < > >60 >60 >60  ANIONGAP 11 9   < > 7 6 8    < > = values in this interval not displayed.    Lipids No results for input(s): "CHOL", "TRIG", "HDL", "LABVLDL", "LDLCALC", "CHOLHDL" in the last 168 hours.  Hematology Recent Labs  Lab 07/16/22 0036 07/17/22 0607 07/18/22 0026  WBC 3.9* 4.3 4.6  RBC 3.84* 4.16* 4.26  HGB 12.3* 13.1 13.5  HCT 37.6* 40.8 40.9  MCV 97.9 98.1 96.0  MCH 32.0 31.5 31.7  MCHC 32.7 32.1 33.0  RDW 12.4 12.5 12.3  PLT 150 158 160   Thyroid  Recent Labs  Lab 07/13/22 2135  TSH 1.317    BNP Recent Labs  Lab 07/13/22 1901 07/15/22 0554 07/16/22 0036  BNP 76.9 121.3* 346.9*    DDimer No results for input(s): "DDIMER" in the last 168 hours.   Radiology    No results found.  Cardiac Studies    07/14/22: c.MRI IMPRESSION: 1. 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 2. RV insertion site LGE, which is a nonspecific scar pattern often seen in setting of elevated pulmonary pressures 3.  Mild LV dilatation with moderate systolic dysfunction (EF 40%) 4.  Normal RV size with moderate systolic dysfunction (EF 98%)   07/14/22: TTE 1. Left ventricular ejection fraction, by estimation, is 20 to 25%. The  left ventricle has severely decreased function. The left ventricle has no  regional wall motion abnormalities. The left ventricular internal cavity  size was moderately dilated. Left  ventricular diastolic parameters were normal.   2. Right ventricular systolic function is normal. The right ventricular  size is normal. Mildly increased right ventricular wall thickness. There  is normal pulmonary artery systolic pressure.   3. Left atrial size was moderately dilated.   4. Right atrial size was mildly dilated.   5. The mitral valve is normal in structure. Trivial mitral valve  regurgitation. No evidence of mitral stenosis.   6. The aortic valve is normal in structure. Aortic valve regurgitation is  not visualized. No aortic stenosis is present.   7. The inferior vena cava is normal in size with greater than 50%  respiratory variability, suggesting right atrial pressure of 3 mmHg.    Patient Profile     52 y.o. male w/PMHx of  HFrEF, paroxysmal AFib, AFL s/p CTI, ETOH abuse hx, and HTN admitted with recurrent WCT, symptomatic with dizziness, c/w VT  Device information [employer] ILR implanted 02/15/22 for AFib surveillance  Assessment & Plan    1.  WCT -> Likely VT He was started on amiodarone gtt >> transitioned to PO now 2 days  Planned for dual chamber ICD implant and loop removal today Narrow QRS   2. Paroxysmal AF 3. AFL s/p CTI 11/2021 CHA2DS2Vasc  is 3, on eliquis outpt >> heparin here Hep  gtt off at [facility] last night Maintaining SR   4. Chronic systolic CHF Prior question of non-impaction vs ETOH induced, though ETOH less likely to cause scar on cMRI C.MRI this admission Basal septal midwall LGE, which is a scar pattern seen in nonischemic cardiomyopathies and associated with worse prognosis.  EF 45-50% 12/2021 >> 20-25%, RV OK (31% by MRI) Exam is euvolemic On coreg, farxiga, spiro, and entresto. EF has previously been as low as 13% on cMRI.      For questions or updates, please contact [place] Please consult [email or web address] for contact info under        Signed, Baldwin [place], PA-C  07/18/2022, 7:43 AM

## 2022-07-18 NOTE — Progress Notes (Signed)
PROGRESS NOTE                                                                                                                                                                                                             Patient Demographics:    Bryan Guerrero, is a 52 y.o. male, DOB - 1971-03-16, HKV:[id number]  Outpatient Primary MD for the patient is Elsie Stain, MD    LOS - 4  Admit date - 07/13/2022    Chief Complaint  Patient presents with   Abdominal Pain    Pain on and off yesterday and today       Brief Narrative (HPI from H&P)    52 y.o. male with medical history significant of HFrEF, alcoholic cardiomyopathy, hypertension and prior history of alcohol abuse came to ED with complaint of palpitations and racing of heart, had a recent loop recorder placed few weeks ago, in the ER workup suggestive of wide-complex tachycardia along with AKI seen by cardiology and admitted to the hospital for further care.   Subjective:   Patient in bed, appears comfortable, denies any headache, no fever, no chest pain or pressure, no shortness of breath , no abdominal pain. No new focal weakness.   Assessment  & Plan :    Nonspecific wide-complex tachycardia.  A-fib with aberrancy versus V. tach.  Seen by cardiology.  Has a loop recorder which was placed recently, cardiology to evaluate further for now continue amiodarone drip, low-dose Coreg and Entresto as tolerated by blood pressure and monitor on telemetry.  Echo noted with EF now down to 25% from previous EF of 45% in July 2023, cardiac MRI also noted, case discussed with EP physician Dr. Quentin Ore they would likely proceed with AICD placement on 07/18/2022.  Chronic systolic congestive heart failure ([place]) EF around now around 25% down from 45% 6 months ago in July 2023.  Blood pressure improved medications per CHF team, currently no shortness of breath.  Now on lower than home dose  Entresto due to hypotension along with lower than home dose Coreg.  AKI.  Creatinine around 1.1 few months ago.  Discussed with his CHF physician Dr. Aundra Dubin, hold blood pressure medications today, gentle hydration, cut down Entresto Coreg and Aldactone dose from tomorrow if blood pressure tolerates.  Monitor renal function.  Primary hypertension  -Hypotensive now  resolved, continue to monitor on present medication regimen.  Atrial flutter ([place]) - Continue low-dose Coreg from tomorrow as blood pressure tolerates, for now as needed IV Lopressor, continue Eliquis, [place] vas 2 score of 3.        Condition - Fair  Family Communication  :  None  Code Status :  Full  Consults  :  Cards  PUD Prophylaxis :    Procedures  :     TTE - 1. Left ventricular ejection fraction, by estimation, is 20 to 25%. The left ventricle has severely decreased function. The left ventricle has no regional wall motion abnormalities. The left ventricular internal cavity size was moderately dilated. Left ventricular diastolic parameters were normal.  2. Right ventricular systolic function is normal. The right ventricular size is normal. Mildly increased right ventricular wall thickness. There is normal pulmonary artery systolic pressure.  3. Left atrial size was moderately dilated.  4. Right atrial size was mildly dilated.  5. The mitral valve is normal in structure. Trivial mitral valve regurgitation. No evidence of mitral stenosis.  6. The aortic valve is normal in structure. Aortic valve regurgitation is not visualized. No aortic stenosis is present.  7. The inferior vena cava is normal in size with greater than 50% respiratory variability, suggesting right atrial pressure of 3 mmHg  Cardiac MRI - 1. 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 2. RV insertion site LGE, which is a nonspecific scar pattern often seen in setting of elevated  pulmonary pressures 3.  Mild LV dilatation with moderate systolic dysfunction (EF 40%) 4.  Normal RV size with moderate systolic dysfunction (EF 98%)      Disposition Plan  :    Status is: Observation  DVT Prophylaxis  :    Place and maintain sequential compression device Start: 07/14/22 1004    Lab Results  Component Value Date   PLT 160 07/18/2022    Diet :  Diet Order             Diet NPO time specified Except for: Sips with Meds  Diet effective midnight                    Inpatient Medications  Scheduled Meds:  (feeding supplement) PROSource Plus  30 mL Oral TID BM   amiodarone  200 mg Oral BID   carvedilol  3.125 mg Oral BID   chlorhexidine  60 mL Topical Once   dapagliflozin propanediol  10 mg Oral Daily   feeding supplement  237 mL Oral BID BM   gentamicin (GARAMYCIN) 80 mg in sodium chloride 0.9 % 500 mL irrigation  80 mg Irrigation On Call   multivitamin with minerals  1 tablet Oral Daily   sacubitril-valsartan  1 tablet Oral BID   spironolactone  12.5 mg Oral Daily   Continuous Infusions:  sodium chloride     sodium chloride 50 mL/hr at 07/18/22 1191    ceFAZolin (ANCEF) IV     PRN Meds:.acetaminophen **OR** acetaminophen, ondansetron **OR** ondansetron (ZOFRAN) IV, polyethylene glycol  Antibiotics  :    Anti-infectives (From admission, onward)    Start     Dose/Rate Route Frequency Ordered Stop   07/18/22 0730  gentamicin (GARAMYCIN) 80 mg in sodium chloride 0.9 % 500 mL irrigation        80 mg Irrigation On call 07/18/22 0644 07/19/22 0730   07/18/22 0730  ceFAZolin (ANCEF) IVPB 2g/100  mL premix        2 g 200 mL/hr over 30 Minutes Intravenous On call 07/18/22 0644 07/19/22 0730         Objective:   Vitals:   07/17/22 1922 07/17/22 2152 07/18/22 0435 07/18/22 0817  BP: (!) 138/101 (!) 132/91  125/84  Pulse: 77   (!) 58  Resp: 16 16  18   Temp: 98 F (36.7 C) 98 F (36.7 C)  98 F (36.7 C)  TempSrc: Oral Oral  Oral  SpO2: 100%  100%  98%  Weight:   79.1 kg   Height:        Wt Readings from Last 3 Encounters:  07/18/22 79.1 kg  06/08/22 82.6 kg  02/15/22 85.3 kg     Intake/Output Summary (Last 24 hours) at 07/18/2022 1450 Last data filed at 07/18/2022 09/16/2022 Gross per 24 hour  Intake --  Output 1450 ml  Net -1450 ml     Physical Exam  Awake Alert, No new F.N deficits, Normal affect .AT,PERRAL Supple Neck, No JVD,   Symmetrical Chest wall movement, Good air movement bilaterally, CTAB RRR,No Gallops,Rubs or new Murmurs,  +ve B.Sounds, Abd Soft, No tenderness,   No Cyanosis, Clubbing or edema      Data Review:    Recent Labs  Lab 07/13/22 1901 07/13/22 2157 07/14/22 0416 07/15/22 0551 07/16/22 0036 07/17/22 0607 07/18/22 0026  WBC 7.6  --  7.0 5.0 3.9* 4.3 4.6  HGB 17.8*   < > 14.1 12.6* 12.3* 13.1 13.5  HCT 51.5   < > 41.8 38.1* 37.6* 40.8 40.9  PLT 247  --  207 177 150 158 160  MCV 93.8  --  95.4 96.2 97.9 98.1 96.0  MCH 32.4  --  32.2 31.8 32.0 31.5 31.7  MCHC 34.6  --  33.7 33.1 32.7 32.1 33.0  RDW 13.9  --  12.8 12.7 12.4 12.5 12.3  LYMPHSABS 1.3  --   --  2.1 1.9 1.7 1.6  MONOABS 0.5  --   --  0.6 0.5 0.5 0.5  EOSABS 0.0  --   --  0.1 0.1 0.1 0.1  BASOSABS 0.0  --   --  0.0 0.0 0.0 0.0   < > = values in this interval not displayed.    Recent Labs  Lab 07/13/22 1901 07/13/22 2022 07/13/22 2022 07/13/22 2135 07/13/22 2157 07/13/22 2334 07/14/22 0416 07/15/22 0554 07/16/22 0036 07/17/22 0607 07/18/22 0026  NA  --  137   < > 138   < >  --  136 136 137 137 135  K  --  SPECIMEN HEMOLYZED. HEMOLYSIS MAY AFFECT INTEGRITY OF RESULTS.   < > 4.0   < >  --  4.1 3.5 3.5 4.0 4.5  CL  --  103   < > 108   < >  --  103 104 106 106 102  CO2  --  23   < > 21*  --   --  19* 23 24 25 25   ANIONGAP  --  11   < > 9  --   --  14 9 7 6 8   GLUCOSE  --  99   < > 79   < >  --  94 120* 119* 93 85  BUN  --  15   < > 14   < >  --  18 15 12 15 17   CREATININE  --  1.79*   < > 1.68*   < >  --  1.71*  1.27* 1.36* 1.18 1.20  AST  --  SPECIMEN HEMOLYZED. HEMOLYSIS MAY AFFECT INTEGRITY OF RESULTS.  --  28  --   --   --   --   --   --   --   ALT  --  SPECIMEN HEMOLYZED. HEMOLYSIS MAY AFFECT INTEGRITY OF RESULTS.  --  16  --   --   --   --   --   --   --   ALKPHOS  --  SPECIMEN HEMOLYZED. HEMOLYSIS MAY AFFECT INTEGRITY OF RESULTS.  --  54  --   --   --   --   --   --   --   BILITOT  --  SPECIMEN HEMOLYZED. HEMOLYSIS MAY AFFECT INTEGRITY OF RESULTS.  --  1.5*  --   --   --   --   --   --   --   ALBUMIN  --  3.6  --  3.4*  --   --   --   --   --   --   --   LATICACIDVEN  --   --   --  1.9  --  1.2  --   --   --   --   --   INR  --  1.5*  --   --   --   --   --   --   --   --   --   TSH  --   --   --  1.317  --   --   --   --   --   --   --   BNP 76.9  --   --   --   --   --   --  121.3* 346.9*  --   --   MG  --  SPECIMEN HEMOLYZED. HEMOLYSIS MAY AFFECT INTEGRITY OF RESULTS.   < >  --   --   --  1.9 2.0 1.9 2.1 2.0  CALCIUM  --  9.2   < > 9.0  --   --  8.6* 8.7* 8.7* 8.9 9.4   < > = values in this interval not displayed.      Radiology Reports MR CARDIAC MORPHOLOGY W WO CONTRAST  Result Date: 07/14/2022 CLINICAL DATA:  39M with NICM p/w WCT EXAM: CARDIAC MRI TECHNIQUE: The patient was scanned on a 1.5 Tesla Siemens magnet. A dedicated cardiac coil was used. Functional imaging was done using Fiesta sequences. 2,3, and 4 chamber views were done to assess for RWMA's. Modified Simpson's rule using a short axis stack was used to calculate an ejection fraction on a dedicated work [employer], [employer]. The patient received 10 cc of Gadavist. After 10 minutes inversion recovery sequences were used to assess for infiltration and scar tissue. Phase contrast velocity mapping was performed above the aortic and pulmonic valves CONTRAST:  10 cc  of Gadavist FINDINGS: Left ventricle: -Mild dilatation -Moderate systolic dysfunction -Elevated ECV (37%) -Basal septal midwall LGE -RV insertion site LGE LV EF: 31%  (Normal 49-79%) Absolute volumes: LV EDV: (Normal 95-215 mL) LV ESV: (Normal 25-85 mL) LV SV: 32mL (Normal 61-145 mL) CO: 3.6L/min (Normal 3.4-7.8 L/min) Indexed volumes: LV EDV: 137mL/sq-m (Normal 50-108 mL/sq-m) LV ESV: 62mL/sq-m (Normal 11-47 mL/sq-m) LV SV: 8mL/sq-m (Normal 33-72 mL/sq-m) CI: 1.8L/min/sq-m (Normal 1.8-4.2 L/min/sq-m) Right ventricle: Normal size with moderate systolic dysfunction RV EF:  38% (Normal 51-80%) Absolute volumes: RV EDV: (  Normal 109-217 mL) RV ESV: (Normal 23-91 mL) RV SV: 34mL (Normal 71-141 mL) CO: 3.4L/min (Normal 2.8-8.8 L/min) Indexed volumes: RV EDV: 7mL/sq-m (Normal 58-109 mL/sq-m) RV ESV: 14mL/sq-m (Normal 12-46 mL/sq-m) RV SV: 48mL/sq-m (Normal 38-71 mL/sq-m) CI: 1.7L/min/sq-m (Normal 1.7-4.2 L/min/sq-m) Left atrium: Moderate enlargement Right atrium: Normal size Mitral valve: Trivial regurgitation Aortic valve: No regurgitation Tricuspid valve: Trivial regurgitation Pulmonic valve: No regurgitation Aorta: Normal proximal ascending aorta Pericardium: Small effusion IMPRESSION: 1. 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 2. RV insertion site LGE, which is a nonspecific scar pattern often seen in setting of elevated pulmonary pressures 3.  Mild LV dilatation with moderate systolic dysfunction (EF 31%) 4.  Normal RV size with moderate systolic dysfunction (EF 38%) Electronically Signed   By: Epifanio Lesches M.D.   On: 07/14/2022 23:31   MR CARDIAC VELOCITY FLOW MAP  Result Date: 07/14/2022 CLINICAL DATA:  75M with NICM p/w WCT EXAM: CARDIAC MRI TECHNIQUE: The patient was scanned on a 1.5 Tesla Siemens magnet. A dedicated cardiac coil was used. Functional imaging was done using Fiesta sequences. 2,3, and 4 chamber views were done to assess for RWMA's. Modified Simpson's rule using a short axis stack was used to calculate an ejection fraction on a dedicated work  [employer], [employer]. The patient received 10 cc of Gadavist. After 10 minutes inversion recovery sequences were used to assess for infiltration and scar tissue. Phase contrast velocity mapping was performed above the aortic and pulmonic valves CONTRAST:  10 cc  of Gadavist FINDINGS: Left ventricle: -Mild dilatation -Moderate systolic dysfunction -Elevated ECV (37%) -Basal septal midwall LGE -RV insertion site LGE LV EF: 31% (Normal 49-79%) Absolute volumes: LV EDV: (Normal 95-215 mL) LV ESV: (Normal 25-85 mL) LV SV: 33mL (Normal 61-145 mL) CO: 3.6L/min (Normal 3.4-7.8 L/min) Indexed volumes: LV EDV: 164mL/sq-m (Normal 50-108 mL/sq-m) LV ESV: 73mL/sq-m (Normal 11-47 mL/sq-m) LV SV: 32mL/sq-m (Normal 33-72 mL/sq-m) CI: 1.8L/min/sq-m (Normal 1.8-4.2 L/min/sq-m) Right ventricle: Normal size with moderate systolic dysfunction RV EF:  38% (Normal 51-80%) Absolute volumes: RV EDV: (Normal 109-217 mL) RV ESV: (Normal 23-91 mL) RV SV: 30mL (Normal 71-141 mL) CO: 3.4L/min (Normal 2.8-8.8 L/min) Indexed volumes: RV EDV: 53mL/sq-m (Normal 58-109 mL/sq-m) RV ESV: 23mL/sq-m (Normal 12-46 mL/sq-m) RV SV: 26mL/sq-m (Normal 38-71 mL/sq-m) CI: 1.7L/min/sq-m (Normal 1.7-4.2 L/min/sq-m) Left atrium: Moderate enlargement Right atrium: Normal size Mitral valve: Trivial regurgitation Aortic valve: No regurgitation Tricuspid valve: Trivial regurgitation Pulmonic valve: No regurgitation Aorta: Normal proximal ascending aorta Pericardium: Small effusion IMPRESSION: 1. 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 2. RV insertion site LGE, which is a nonspecific scar pattern often seen in setting of elevated pulmonary pressures 3.  Mild LV dilatation with moderate systolic dysfunction (EF 31%) 4.  Normal RV size with moderate systolic dysfunction (EF 38%) Electronically Signed   By: Epifanio Lesches M.D.   On: 07/14/2022 23:31    MR CARDIAC VELOCITY FLOW MAP  Result Date: 07/14/2022 CLINICAL DATA:  75M with NICM p/w WCT EXAM: CARDIAC MRI TECHNIQUE: The patient was scanned on a 1.5 Tesla Siemens magnet. A dedicated cardiac coil was used. Functional imaging was done using Fiesta sequences. 2,3, and 4 chamber views were done to assess for RWMA's. Modified Simpson's rule using a short axis stack was used to calculate an ejection fraction on a dedicated work [employer] officer, [employer].  The patient received 10 cc of Gadavist. After 10 minutes inversion recovery sequences were used to assess for infiltration and scar tissue. Phase contrast velocity mapping was performed above the aortic and pulmonic valves CONTRAST:  10 cc  of Gadavist FINDINGS: Left ventricle: -Mild dilatation -Moderate systolic dysfunction -Elevated ECV (37%) -Basal septal midwall LGE -RV insertion site LGE LV EF: 31% (Normal 49-79%) Absolute volumes: LV EDV: 276mL (Normal 95-215 mL) LV ESV: 135mL (Normal 25-85 mL) LV SV: 60mL (Normal 61-145 mL) CO: 3.6L/min (Normal 3.4-7.8 L/min) Indexed volumes: LV EDV: 164mL/sq-m (Normal 50-108 mL/sq-m) LV ESV: 55mL/sq-m (Normal 11-47 mL/sq-m) LV SV: 15mL/sq-m (Normal 33-72 mL/sq-m) CI: 1.8L/min/sq-m (Normal 1.8-4.2 L/min/sq-m) Right ventricle: Normal size with moderate systolic dysfunction RV EF:  38% (Normal 51-80%) Absolute volumes: RV EDV: 146mL (Normal 109-217 mL) RV ESV: 164mL (Normal 23-91 mL) RV SV: 57mL (Normal 71-141 mL) CO: 3.4L/min (Normal 2.8-8.8 L/min) Indexed volumes: RV EDV: 42mL/sq-m (Normal 58-109 mL/sq-m) RV ESV: 53mL/sq-m (Normal 12-46 mL/sq-m) RV SV: 94mL/sq-m (Normal 38-71 mL/sq-m) CI: 1.7L/min/sq-m (Normal 1.7-4.2 L/min/sq-m) Left atrium: Moderate enlargement Right atrium: Normal size Mitral valve: Trivial regurgitation Aortic valve: No regurgitation Tricuspid valve: Trivial regurgitation Pulmonic valve: No regurgitation Aorta: Normal proximal ascending aorta Pericardium: Small effusion IMPRESSION: 1. Basal  septal midwall LGE, which is a scar pattern seen in nonischemic cardiomyopathies and associated with worse prognosis. 2. RV insertion site LGE, which is a nonspecific scar pattern often seen in setting of elevated pulmonary pressures 3.  Mild LV dilatation with moderate systolic dysfunction (EF 78%) 4.  Normal RV size with moderate systolic dysfunction (EF 24%) Electronically Signed   By: Oswaldo Milian M.D.   On: 07/14/2022 23:31      Signature  -   Lala Lund M.D on 07/18/2022 at 2:50 PM   -  To page go to [email or web address]

## 2022-07-18 NOTE — Plan of Care (Signed)

## 2022-07-18 NOTE — TOC Transition Note (Signed)
Discharge medications (3) are being stored in the main pharmacy on the ground floor until patient is ready for discharge.   

## 2022-07-19 ENCOUNTER — Other Ambulatory Visit (HOSPITAL_COMMUNITY): Payer: Self-pay

## 2022-07-19 ENCOUNTER — Inpatient Hospital Stay (HOSPITAL_COMMUNITY): Payer: 59

## 2022-07-19 ENCOUNTER — Encounter (HOSPITAL_COMMUNITY): Payer: Self-pay | Admitting: Cardiology

## 2022-07-19 DIAGNOSIS — R000 Tachycardia, unspecified: Secondary | ICD-10-CM | POA: Diagnosis not present

## 2022-07-19 LAB — CBC WITH DIFFERENTIAL/PLATELET
Abs Immature Granulocytes: 0.01 10*3/uL (ref 0.00–0.07)
Basophils Absolute: 0 10*3/uL (ref 0.0–0.1)
Basophils Relative: 1 %
Eosinophils Absolute: 0.1 10*3/uL (ref 0.0–0.5)
Eosinophils Relative: 1 %
HCT: 41.7 % (ref 39.0–52.0)
Hemoglobin: 13.8 g/dL (ref 13.0–17.0)
Immature Granulocytes: 0 %
Lymphocytes Relative: 26 %
Lymphs Abs: 1.5 10*3/uL (ref 0.7–4.0)
MCH: 31.6 pg (ref 26.0–34.0)
MCHC: 33.1 g/dL (ref 30.0–36.0)
MCV: 95.4 fL (ref 80.0–100.0)
Monocytes Absolute: 0.7 10*3/uL (ref 0.1–1.0)
Monocytes Relative: 12 %
Neutro Abs: 3.5 10*3/uL (ref 1.7–7.7)
Neutrophils Relative %: 60 %
Platelets: 164 10*3/uL (ref 150–400)
RBC: 4.37 MIL/uL (ref 4.22–5.81)
RDW: 12.2 % (ref 11.5–15.5)
WBC: 5.9 10*3/uL (ref 4.0–10.5)
nRBC: 0 % (ref 0.0–0.2)

## 2022-07-19 LAB — GLUCOSE, CAPILLARY: Glucose-Capillary: 142 mg/dL — ABNORMAL HIGH (ref 70–99)

## 2022-07-19 LAB — BASIC METABOLIC PANEL
Anion gap: 11 (ref 5–15)
BUN: 19 mg/dL (ref 6–20)
CO2: 23 mmol/L (ref 22–32)
Calcium: 9.6 mg/dL (ref 8.9–10.3)
Chloride: 102 mmol/L (ref 98–111)
Creatinine, Ser: 1.45 mg/dL — ABNORMAL HIGH (ref 0.61–1.24)
GFR, Estimated: 59 mL/min — ABNORMAL LOW (ref >60)
Glucose, Bld: 97 mg/dL (ref 70–99)
Potassium: 4.2 mmol/L (ref 3.5–5.1)
Sodium: 136 mmol/L (ref 135–145)

## 2022-07-19 MED ORDER — LACTATED RINGERS IV SOLN: INTRAVENOUS | Status: AC

## 2022-07-19 MED ORDER — SPIRONOLACTONE 12.5 MG HALF TABLET: 12.5000 mg | ORAL_TABLET | Freq: Every day | ORAL | Status: DC

## 2022-07-19 MED ORDER — SACUBITRIL-VALSARTAN 24-26 MG PO TABS: 1.0000 | ORAL_TABLET | Freq: Two times a day (BID) | ORAL | Status: DC

## 2022-07-19 MED ORDER — DAPAGLIFLOZIN PROPANEDIOL 10 MG PO TABS
10.0000 mg | ORAL_TABLET | Freq: Every day | ORAL | 0 refills | Status: DC
  Filled 2022-07-19: qty 90, 90d supply, fill #0

## 2022-07-19 NOTE — TOC Transition Note (Addendum)
Transition of Care [facility]) - CM/SW Discharge Note   Patient Details  Name: Bryan Guerrero MRN: [id number] Date of Birth: 1970/12/18  [facility] CM/SW Contact:  Zenon Mayo, RN Phone Number: 07/19/2022, 8:56 AM   Clinical Narrative:     Patient is for dc today, he was asking about where to apply for disabiity, NCM informed patient to go to Social Security office to apply for disability, he states he understands.  He states he has a ride home today. He has no needs. TOC to fill meds. His copay for entresto is 65.00 he has usied the 30 day free already.         Patient Goals and CMS Choice      Discharge Placement                         Discharge Plan and Services Additional resources added to the After Visit Summary for                                       Social Determinants of Health (SDOH) Interventions SDOH Screenings   Food Insecurity: No Food Insecurity (07/14/2022)  Housing: Low Risk  (07/14/2022)  Transportation Needs: No Transportation Needs (07/14/2022)  Utilities: Not At Risk (07/14/2022)  Alcohol Screen: Medium Risk (03/18/2020)  Depression (PHQ2-9): Low Risk  (10/13/2021)  Tobacco Use: High Risk (07/19/2022)     Readmission Risk Interventions     No data to display

## 2022-07-19 NOTE — Progress Notes (Signed)
Patient had 6 beats of vtach. Patient awake upon assessment, asymptomatic, and no signs of distress. No change in plan of care. Attending notified via secure chat.

## 2022-07-19 NOTE — Progress Notes (Signed)
Rounding Note    Patient Name: Bryan Guerrero Date of Encounter: 07/19/2022  [place] Cardiologist: Sanda Klein, MD  EP: Dr. Quentin Ore  Subjective   No CP, no SOB, mild aching at procedure sites  Inpatient Medications    Scheduled Meds:  (feeding supplement) PROSource Plus  30 mL Oral TID BM   amiodarone  200 mg Oral BID   carvedilol  3.125 mg Oral BID   dapagliflozin propanediol  10 mg Oral Daily   feeding supplement  237 mL Oral BID BM   multivitamin with minerals  1 tablet Oral Daily   [START ON 07/20/2022] sacubitril-valsartan  1 tablet Oral BID   [START ON 07/20/2022] spironolactone  12.5 mg Oral Daily   Continuous Infusions:  lactated ringers 100 mL/hr at 07/19/22 0742   PRN Meds: acetaminophen **OR** acetaminophen, ondansetron **OR** ondansetron (ZOFRAN) IV, polyethylene glycol   Vital Signs    Vitals:   07/19/22 0300 07/19/22 0400 07/19/22 0556 07/19/22 0715  BP: 109/87 117/84 (!) 135/91 124/89  Pulse:   62 61  Resp:   14 18  Temp:   98.2 F (36.8 C) 98 F (36.7 C)  TempSrc:   Oral Oral  SpO2:   100% 98%  Weight:   78.1 kg   Height:        Intake/Output Summary (Last 24 hours) at 07/19/2022 0858 Last data filed at 07/19/2022 0826 Gross per 24 hour  Intake 667.98 ml  Output --  Net 667.98 ml      07/19/2022    5:56 AM 07/18/2022    4:35 AM 07/17/2022    5:00 AM  Last 3 Weights  Weight (lbs) 172 lb 3.2 oz 174 lb 6.1 oz 176 lb  Weight (kg) 78.109 kg 79.1 kg 79.833 kg      Telemetry    SB/SR 50's-70's - Personally Reviewed  ECG    SB 52bpm, (one A paced beat) - Personally Reviewed  Physical Exam   GEN: No acute distress.   Neck: No JVD Cardiac: RRR, no murmurs, rubs, or gallops.  Respiratory: CTA b/l. GI: Soft, nontender, non-distended  MS: No edema; No deformity. Neuro:  Nonfocal  Psych: Normal affect   ICD implant and loop extractions sites are both stable, no bleeding, hematoma, drainage  Labs    High Sensitivity  Troponin:   Recent Labs  Lab 07/13/22 2022 07/13/22 2135  TROPONINIHS 47* 60*     Chemistry Recent Labs  Lab 07/13/22 2022 07/13/22 2135 07/13/22 2157 07/16/22 0036 07/17/22 0607 07/18/22 0026 07/19/22 0038  NA 137 138   < > 137 137 135 136  K SPECIMEN HEMOLYZED. HEMOLYSIS MAY AFFECT INTEGRITY OF RESULTS. 4.0   < > 3.5 4.0 4.5 4.2  CL 103 108   < > 106 106 102 102  CO2 23 21*   < > 24 25 25 23   GLUCOSE 99 79   < > 119* 93 85 97  BUN 15 14   < > 12 15 17 19   CREATININE 1.79* 1.68*   < > 1.36* 1.18 1.20 1.45*  CALCIUM 9.2 9.0   < > 8.7* 8.9 9.4 9.6  MG SPECIMEN HEMOLYZED. HEMOLYSIS MAY AFFECT INTEGRITY OF RESULTS.  --    < > 1.9 2.1 2.0  --   PROT 5.8* 6.1*  --   --   --   --   --   ALBUMIN 3.6 3.4*  --   --   --   --   --  AST SPECIMEN HEMOLYZED. HEMOLYSIS MAY AFFECT INTEGRITY OF RESULTS. 28  --   --   --   --   --   ALT SPECIMEN HEMOLYZED. HEMOLYSIS MAY AFFECT INTEGRITY OF RESULTS. 16  --   --   --   --   --   ALKPHOS SPECIMEN HEMOLYZED. HEMOLYSIS MAY AFFECT INTEGRITY OF RESULTS. 54  --   --   --   --   --   BILITOT SPECIMEN HEMOLYZED. HEMOLYSIS MAY AFFECT INTEGRITY OF RESULTS. 1.5*  --   --   --   --   --   GFRNONAA 46* 50*   < > >60 >60 >60 59*  ANIONGAP 11 9   < > 7 6 8 11    < > = values in this interval not displayed.    Lipids No results for input(s): "CHOL", "TRIG", "HDL", "LABVLDL", "LDLCALC", "CHOLHDL" in the last 168 hours.  Hematology Recent Labs  Lab 07/17/22 0607 07/18/22 0026 07/19/22 0038  WBC 4.3 4.6 5.9  RBC 4.16* 4.26 4.37  HGB 13.1 13.5 13.8  HCT 40.8 40.9 41.7  MCV 98.1 96.0 95.4  MCH 31.5 31.7 31.6  MCHC 32.1 33.0 33.1  RDW 12.5 12.3 12.2  PLT 158 160 164   Thyroid  Recent Labs  Lab 07/13/22 2135  TSH 1.317    BNP Recent Labs  Lab 07/13/22 1901 07/15/22 0554 07/16/22 0036  BNP 76.9 121.3* 346.9*    DDimer No results for input(s): "DDIMER" in the last 168 hours.   Radiology    DG Chest 2 View Result Date: 07/19/2022 CLINICAL  DATA:  AICD. EXAM: CHEST - 2 VIEW COMPARISON:  Chest x-ray 07/13/2022. FINDINGS: AICD noted with lead tips over the right atrium and right ventricle. Mild cardiomegaly, no pulmonary venous congestion. No focal infiltrate. No pleural effusion or pneumothorax. IMPRESSION: 1. AICD noted with lead tips over the right atrium right ventricle. Mild cardiomegaly. No pulmonary venous congestion. 2.  No evidence of infiltrate, pleural effusion, or pneumothorax. Electronically Signed   By: Marcello Moores  Register M.D.   On: 07/19/2022 06:34    Cardiac Studies   07/14/22: c.MRI IMPRESSION: 1. 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 2. RV insertion site LGE, which is a nonspecific scar pattern often seen in setting of elevated pulmonary pressures 3.  Mild LV dilatation with moderate systolic dysfunction (EF 10%) 4.  Normal RV size with moderate systolic dysfunction (EF 27%)   07/14/22: TTE 1. Left ventricular ejection fraction, by estimation, is 20 to 25%. The  left ventricle has severely decreased function. The left ventricle has no  regional wall motion abnormalities. The left ventricular internal cavity  size was moderately dilated. Left  ventricular diastolic parameters were normal.   2. Right ventricular systolic function is normal. The right ventricular  size is normal. Mildly increased right ventricular wall thickness. There  is normal pulmonary artery systolic pressure.   3. Left atrial size was moderately dilated.   4. Right atrial size was mildly dilated.   5. The mitral valve is normal in structure. Trivial mitral valve  regurgitation. No evidence of mitral stenosis.   6. The aortic valve is normal in structure. Aortic valve regurgitation is  not visualized. No aortic stenosis is present.   7. The inferior vena cava is normal in size with greater than 50%  respiratory variability, suggesting right atrial pressure of  3 mmHg.    Patient Profile  52 y.o. male w/PMHx of  HFrEF, paroxysmal AFib, AFL s/p CTI, ETOH abuse hx, and HTN admitted with recurrent WCT, symptomatic with dizziness, c/w VT  Device information [employer] ILR implanted 02/15/22 for AFib surveillance  Assessment & Plan    1.  WCT -> Likely VT He was started on amiodarone gtt >> transitioned to PO  S/p ICD implant and loop explant yesterday with Dr. Quentin Ore Site is stable ICD check this AM with stable measurements CXR this morning with no PTX Wound care and activity restrictions were reviewed with the patient   Discharge on amiodarone 200mg  BID for 5 days then daily EP follow up is in place   2. Paroxysmal AF 3. AFL s/p CTI 11/2021 CHA2DS2Vasc is 3, on eliquis outpt >> heparin here Hep gtt off at [facility] last night Maintaining SR  Do not resume Eliquis until 07/24/22     4. Chronic systolic CHF Prior question of non-impaction vs ETOH induced, though ETOH less likely to cause scar on cMRI C.MRI this admission Basal septal midwall LGE, which is a scar pattern seen in nonischemic cardiomyopathies and associated with worse prognosis.  EF 45-50% 12/2021 >> 20-25%, RV OK (31% by MRI) Exam is euvolemic On coreg, farxiga, spiro, and entresto. EF has previously been as low as 13% on cMRI.   Appears euvolemic    Dr. Quentin Ore has seen the patient OK for discharge from our perspective     For questions or updates, please contact [place] Please consult [email or web address] for contact info under        Signed, Baldwin [place], PA-C  07/19/2022, 8:58 AM

## 2022-07-19 NOTE — Discharge Summary (Signed)
Bryan Guerrero ZOX:[id number] DOB: 1971-04-18 DOA: 07/13/2022  PCP: Storm Frisk, MD  Admit date: 07/13/2022  Discharge date: 07/19/2022  Admitted From: Home   Disposition:  Home   Recommendations for Outpatient Follow-up:   Follow up with PCP in 1-2 weeks  PCP Please obtain BMP/CBC, 2 view CXR in 1week,  (see Discharge instructions)   PCP Please follow up on the following pending results: Monitor blood pressure, BMP and blood pressure medications closely.   Home Health: None   Equipment/Devices: None  Consultations: Cards Discharge Condition: Stable    CODE STATUS: Full    Diet Recommendation: Heart Healthy with 1.5 L fluid restriction per day.    Chief Complaint  Patient presents with   Abdominal Pain    Pain on and off yesterday and today     Brief history of present illness from the day of admission and additional interim summary    52 y.o. male with medical history significant of HFrEF, alcoholic cardiomyopathy, hypertension and prior history of alcohol abuse came to ED with complaint of palpitations and racing of heart, had a recent loop recorder placed few weeks ago, in the ER workup suggestive of wide-complex tachycardia along with AKI seen by cardiology and admitted to the hospital for further care.                                                                  Hospital Course   Nonspecific wide-complex tachycardia.  A-fib with aberrancy versus V. tach.  Seen by cardiology.  Has a loop recorder which was placed recently, seen by cardiology/EP now on amiodarone, low-dose Coreg and Entresto as tolerated by blood pressure and monitor on telemetry.  Echo noted with EF now down to 25% from previous EF of 45% in July 2023, cardiac MRI also noted consistent with with scar pattern, he underwent AICD  placement on 07/18/2022,  stable this morning will be discharged home with outpatient PCP and EP follow-up.   Nonischemic cardiomyopathy with chronic systolic congestive heart failure (HCC) EF around now around 25% down from 45% 6 months ago in July 2023.  Blood pressure improved medications per CHF team, currently no shortness of breath.  Now on lower than home dose Entresto due to hypotension along with lower than home dose Coreg.   AKI.  Creatinine around 1.1 few months ago.  Discussed with his CHF physician Dr. Shirlee Latch, hold blood pressure medications drop, gently hydrated, renal function improving will be discharged home on less than home dose Entresto and diuretic dose, request PCP to follow blood pressure, diuretics and medication dosages closely.     Primary hypertension  - Hypotensive now resolved, continue to monitor on present medication regimen.   Atrial flutter (HCC) - Continue low-dose Coreg and Eliquis, [place] vas 2 score of  3.    Discharge diagnosis     Principal Problem:   Tachycardia, unspecified Active Problems:   Chronic systolic congestive heart failure (HCC)   Primary hypertension   Atrial flutter (HCC)   AKI (acute kidney injury) [facility])    Discharge instructions    Discharge Instructions     Discharge instructions   Complete by: As directed    Follow with Primary MD Storm Frisk, MD in 7 days .  Follow-up post AICD instructions as given by the cardiologist.  Get CBC, CMP, 2 view Chest X ray -  checked next visit with your primary MD   Activity: As tolerated with Full fall precautions use walker/cane & assistance as needed  Disposition Home    Diet: Heart Healthy  Check your Weight same time everyday, if you gain over 2 pounds, or you develop in leg swelling, experience more shortness of breath or chest pain, call your Primary MD immediately. Follow Cardiac Low Salt Diet and 1.5 lit/day fluid restriction.  Special Instructions: If you have smoked or chewed  Tobacco  in the last 2 yrs please stop smoking, stop any regular Alcohol  and or any Recreational drug use.  On your next visit with your primary care physician please Get Medicines reviewed and adjusted.  Please request your Prim.MD to go over all Hospital Tests and Procedure/Radiological results at the follow up, please get all Hospital records sent to your Prim MD by signing hospital release before you go home.  If you experience worsening of your admission symptoms, develop shortness of breath, life threatening emergency, suicidal or homicidal thoughts you must seek medical attention immediately by calling 911 or calling your MD immediately  if symptoms less severe.  You Must read complete instructions/literature along with all the possible adverse reactions/side effects for all the Medicines you take and that have been prescribed to you. Take any new Medicines after you have completely understood and accpet all the possible adverse reactions/side effects.   Discharge wound care:   Complete by: As directed    Follow the AICD related instructions given by the cardiologist, keep the AICD site clean and dry at all times.   Increase activity slowly   Complete by: As directed        Discharge Medications   Allergies as of 07/19/2022   No Known Allergies      Medication List     STOP taking these medications    Entresto 97-103 MG Generic drug: sacubitril-valsartan Replaced by: Sherryll Burger 24-26 MG       TAKE these medications    acetaminophen 500 MG tablet Commonly known as: TYLENOL Take 1,000 mg by mouth every 6 (six) hours as needed for mild pain.   amiodarone 200 MG tablet Commonly known as: PACERONE Take 1 tablet (200 mg total) by mouth 2 (two) times daily.   apixaban 5 MG Tabs tablet Commonly known as: ELIQUIS Take 1 tablet (5 mg total) by mouth 2 (two) times daily.   carvedilol 3.125 MG tablet Commonly known as: COREG Take 1 tablet (3.125 mg total) by mouth 2 (two)  times daily. What changed:  medication strength how much to take   dapagliflozin propanediol 10 MG Tabs tablet Commonly known as: FARXIGA Take 1 tablet (10 mg total) by mouth daily.   Entresto 24-26 MG Generic drug: sacubitril-valsartan Take 1 tablet by mouth 2 (two) times daily. Replaces: Entresto 97-103 MG   furosemide 20 MG tablet Commonly known as: Lasix Take 1 tablet (20 mg  total) by mouth daily as needed. For > 3 lb weight gain in 24 hr or > 5 lb weight gain in 1 week   multivitamin with minerals Tabs tablet Take 1 tablet by mouth daily.   spironolactone 25 MG tablet Commonly known as: ALDACTONE Take 0.5 tablets (12.5 mg total) by mouth daily. What changed: how much to take               Discharge Care Instructions  (From admission, onward)           Start     Ordered   07/19/22 0000  Discharge wound care:       Comments: Follow the AICD related instructions given by the cardiologist, keep the AICD site clean and dry at all times.   07/19/22 5462             Follow-up Information     Storm Frisk, MD. Schedule an appointment as soon as possible for a visit in 1 week(s).   Specialty: Pulmonary Disease Contact information: [address] [place] [address] [phone]         Marinus Maw, MD. Schedule an appointment as soon as possible for a visit in 1 week(s).   Specialty: Cardiology Contact information: [address] [place] [address] [phone]                 Major procedures and Radiology Reports - PLEASE review detailed and final reports thoroughly  -       DG Chest 2 View  Result Date: 07/19/2022 CLINICAL DATA:  AICD. EXAM: CHEST - 2 VIEW COMPARISON:  Chest x-ray 07/13/2022. FINDINGS: AICD noted with lead tips over the right atrium and right ventricle. Mild cardiomegaly, no pulmonary venous congestion. No focal infiltrate. No pleural effusion or pneumothorax. IMPRESSION: 1.  AICD noted with lead tips over the right atrium right ventricle. Mild cardiomegaly. No pulmonary venous congestion. 2.  No evidence of infiltrate, pleural effusion, or pneumothorax. Electronically Signed   By: Maisie Fus  Register M.D.   On: 07/19/2022 06:34   EP PPM/ICD IMPLANT  Result Date: 07/18/2022  CONCLUSIONS:  1. Chronic systolic heart failure and sustained monomorphic VT  2. Successful ICD implantation with a Environmental manager DDD ICD implanted for secondary prevention of sudden death.  3.  No early apparent complications.  4. Hold anticoagulation for 5 days (OK to restart 07/24/2022)   MR CARDIAC MORPHOLOGY W WO CONTRAST  Result Date: 07/14/2022 CLINICAL DATA:  11M with NICM p/w WCT EXAM: CARDIAC MRI TECHNIQUE: The patient was scanned on a 1.5 Tesla Siemens magnet. A dedicated cardiac coil was used. Functional imaging was done using Fiesta sequences. 2,3, and 4 chamber views were done to assess for RWMA's. Modified Simpson's rule using a short axis stack was used to calculate an ejection fraction on a dedicated work Research officer, trade [employer]. The patient received 10 cc of Gadavist. After 10 minutes inversion recovery sequences were used to assess for infiltration and scar tissue. Phase contrast velocity mapping was performed above the aortic and pulmonic valves CONTRAST:  10 cc  of Gadavist FINDINGS: Left ventricle: -Mild dilatation -Moderate systolic dysfunction -Elevated ECV (37%) -Basal septal midwall LGE -RV insertion site LGE LV EF: 31% (Normal 49-79%) Absolute volumes: LV EDV: (Normal 95-215 mL) LV ESV: (Normal 25-85 mL) LV SV: 58mL (Normal 61-145 mL) CO: 3.6L/min (Normal 3.4-7.8 L/min) Indexed volumes: LV EDV: 145mL/sq-m (Normal 50-108 mL/sq-m) LV ESV: 22mL/sq-m (Normal  11-47 mL/sq-m) LV SV: 70mL/sq-m (Normal 33-72 mL/sq-m) CI: 1.8L/min/sq-m (Normal 1.8-4.2 L/min/sq-m) Right ventricle: Normal size with moderate systolic dysfunction RV EF:  38% (Normal 51-80%) Absolute volumes: RV  EDV: (Normal 109-217 mL) RV ESV: (Normal 23-91 mL) RV SV: 13mL (Normal 71-141 mL) CO: 3.4L/min (Normal 2.8-8.8 L/min) Indexed volumes: RV EDV: 69mL/sq-m (Normal 58-109 mL/sq-m) RV ESV: 90mL/sq-m (Normal 12-46 mL/sq-m) RV SV: 75mL/sq-m (Normal 38-71 mL/sq-m) CI: 1.7L/min/sq-m (Normal 1.7-4.2 L/min/sq-m) Left atrium: Moderate enlargement Right atrium: Normal size Mitral valve: Trivial regurgitation Aortic valve: No regurgitation Tricuspid valve: Trivial regurgitation Pulmonic valve: No regurgitation Aorta: Normal proximal ascending aorta Pericardium: Small effusion IMPRESSION: 1. 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 2. RV insertion site LGE, which is a nonspecific scar pattern often seen in setting of elevated pulmonary pressures 3.  Mild LV dilatation with moderate systolic dysfunction (EF 31%) 4.  Normal RV size with moderate systolic dysfunction (EF 38%) Electronically Signed   By: Epifanio Lesches M.D.   On: 07/14/2022 23:31   MR CARDIAC VELOCITY FLOW MAP  Result Date: 07/14/2022 CLINICAL DATA:  71M with NICM p/w WCT EXAM: CARDIAC MRI TECHNIQUE: The patient was scanned on a 1.5 Tesla Siemens magnet. A dedicated cardiac coil was used. Functional imaging was done using Fiesta sequences. 2,3, and 4 chamber views were done to assess for RWMA's. Modified Simpson's rule using a short axis stack was used to calculate an ejection fraction on a dedicated work Research officer, trade union. The patient received 10 cc of Gadavist. After 10 minutes inversion recovery sequences were used to assess for infiltration and scar tissue. Phase contrast velocity mapping was performed above the aortic and pulmonic valves CONTRAST:  10 cc  of Gadavist FINDINGS: Left ventricle: -Mild dilatation -Moderate systolic dysfunction -Elevated ECV (37%) -Basal septal midwall LGE -RV insertion site LGE LV EF: 31% (Normal 49-79%) Absolute  volumes: LV EDV: (Normal 95-215 mL) LV ESV: (Normal 25-85 mL) LV SV: 54mL (Normal 61-145 mL) CO: 3.6L/min (Normal 3.4-7.8 L/min) Indexed volumes: LV EDV: 163mL/sq-m (Normal 50-108 mL/sq-m) LV ESV: 80mL/sq-m (Normal 11-47 mL/sq-m) LV SV: 54mL/sq-m (Normal 33-72 mL/sq-m) CI: 1.8L/min/sq-m (Normal 1.8-4.2 L/min/sq-m) Right ventricle: Normal size with moderate systolic dysfunction RV EF:  38% (Normal 51-80%) Absolute volumes: RV EDV: (Normal 109-217 mL) RV ESV: (Normal 23-91 mL) RV SV: 73mL (Normal 71-141 mL) CO: 3.4L/min (Normal 2.8-8.8 L/min) Indexed volumes: RV EDV: 25mL/sq-m (Normal 58-109 mL/sq-m) RV ESV: 20mL/sq-m (Normal 12-46 mL/sq-m) RV SV: 32mL/sq-m (Normal 38-71 mL/sq-m) CI: 1.7L/min/sq-m (Normal 1.7-4.2 L/min/sq-m) Left atrium: Moderate enlargement Right atrium: Normal size Mitral valve: Trivial regurgitation Aortic valve: No regurgitation Tricuspid valve: Trivial regurgitation Pulmonic valve: No regurgitation Aorta: Normal proximal ascending aorta Pericardium: Small effusion IMPRESSION: 1. 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 2. RV insertion site LGE, which is a nonspecific scar pattern often seen in setting of elevated pulmonary pressures 3.  Mild LV dilatation with moderate systolic dysfunction (EF 31%) 4.  Normal RV size with moderate systolic dysfunction (EF 38%) Electronically Signed   By: Epifanio Lesches M.D.   On: 07/14/2022 23:31   MR CARDIAC VELOCITY FLOW MAP  Result Date: 07/14/2022 CLINICAL DATA:  71M with NICM p/w WCT EXAM: CARDIAC MRI TECHNIQUE: The patient was scanned on a 1.5 Tesla Siemens magnet. A dedicated cardiac coil was used. Functional imaging was done using Fiesta sequences. 2,3, and  4 chamber views were done to assess for RWMA's. Modified Simpson's rule using a short axis stack was used to calculate an ejection fraction on a dedicated work Chief Technology Officer. The patient received 10 cc of Gadavist. After 10 minutes inversion recovery sequences were used to assess for infiltration and scar tissue. Phase contrast velocity mapping was performed above the aortic and pulmonic valves CONTRAST:  10 cc  of Gadavist FINDINGS: Left ventricle: -Mild dilatation -Moderate systolic dysfunction -Elevated ECV (37%) -Basal septal midwall LGE -RV insertion site LGE LV EF: 31% (Normal 49-79%) Absolute volumes: LV EDV: (Normal 95-215 mL) LV ESV: (Normal 25-85 mL) LV SV: 50mL (Normal 61-145 mL) CO: 3.6L/min (Normal 3.4-7.8 L/min) Indexed volumes: LV EDV: 127mL/sq-m (Normal 50-108 mL/sq-m) LV ESV: 68mL/sq-m (Normal 11-47 mL/sq-m) LV SV: 54mL/sq-m (Normal 33-72 mL/sq-m) CI: 1.8L/min/sq-m (Normal 1.8-4.2 L/min/sq-m) Right ventricle: Normal size with moderate systolic dysfunction RV EF:  38% (Normal 51-80%) Absolute volumes: RV EDV: (Normal 109-217 mL) RV ESV: (Normal 23-91 mL) RV SV: 40mL (Normal 71-141 mL) CO: 3.4L/min (Normal 2.8-8.8 L/min) Indexed volumes: RV EDV: 73mL/sq-m (Normal 58-109 mL/sq-m) RV ESV: 3mL/sq-m (Normal 12-46 mL/sq-m) RV SV: 58mL/sq-m (Normal 38-71 mL/sq-m) CI: 1.7L/min/sq-m (Normal 1.7-4.2 L/min/sq-m) Left atrium: Moderate enlargement Right atrium: Normal size Mitral valve: Trivial regurgitation Aortic valve: No regurgitation Tricuspid valve: Trivial regurgitation Pulmonic valve: No regurgitation Aorta: Normal proximal ascending aorta Pericardium: Small effusion IMPRESSION: 1. 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 2. RV insertion site LGE, which is a nonspecific scar pattern often seen in setting of elevated pulmonary pressures 3.  Mild LV dilatation with moderate systolic dysfunction (EF 31%) 4.  Normal RV size with moderate systolic dysfunction (EF 38%) Electronically Signed   By: Epifanio Lesches M.D.   On: 07/14/2022 23:31   ECHOCARDIOGRAM  COMPLETE  Result Date: 07/14/2022    ECHOCARDIOGRAM REPORT   Patient Name:   Bryan Guerrero Date of Exam: 07/14/2022 Medical Rec #:  [id number]       Height:       72.0 in Accession #:    [id number]      Weight:       180.0 lb Date of Birth:  01-05-1971      BSA:          2.037 m Patient Age:    51 years        BP:           108/85 mmHg Patient Gender: M               HR:           62 bpm. Exam Location:  Inpatient Procedure: 2D Echo, Cardiac Doppler and Color Doppler Indications:    congestive heart failure  History:        Patient has prior history of Echocardiogram examinations, most                 recent 12/21/2021. CHF and Cardiomyopathy, Arrythmias:Atrial                 Flutter, Signs/Symptoms:Dyspnea; Risk Factors:Hypertension and                 Current Smoker.  Sonographer:    Delcie Roch RDCS Referring Phys: [id number] BELAL A SULEIMAN IMPRESSIONS  1. Left ventricular ejection fraction, by estimation, is 20 to 25%. The left ventricle has severely decreased function. The left ventricle has  no regional wall motion abnormalities. The left ventricular internal cavity size was moderately dilated. Left ventricular diastolic parameters were normal.  2. Right ventricular systolic function is normal. The right ventricular size is normal. Mildly increased right ventricular wall thickness. There is normal pulmonary artery systolic pressure.  3. Left atrial size was moderately dilated.  4. Right atrial size was mildly dilated.  5. The mitral valve is normal in structure. Trivial mitral valve regurgitation. No evidence of mitral stenosis.  6. The aortic valve is normal in structure. Aortic valve regurgitation is not visualized. No aortic stenosis is present.  7. The inferior vena cava is normal in size with greater than 50% respiratory variability, suggesting right atrial pressure of 3 mmHg. FINDINGS  Left Ventricle: Left ventricular ejection fraction, by estimation, is 20 to 25%. The left ventricle has severely  decreased function. The left ventricle has no regional wall motion abnormalities. The left ventricular internal cavity size was moderately dilated. There is no left ventricular hypertrophy. Left ventricular diastolic parameters were normal. Right Ventricle: The right ventricular size is normal. Mildly increased right ventricular wall thickness. Right ventricular systolic function is normal. There is normal pulmonary artery systolic pressure. The tricuspid regurgitant velocity is 2.00 m/s, and with an assumed right atrial pressure of 3 mmHg, the estimated right ventricular systolic pressure is 19.0 mmHg. Left Atrium: Left atrial size was moderately dilated. Right Atrium: Right atrial size was mildly dilated. Pericardium: There is no evidence of pericardial effusion. Mitral Valve: The mitral valve is normal in structure. Trivial mitral valve regurgitation. No evidence of mitral valve stenosis. Tricuspid Valve: The tricuspid valve is normal in structure. Tricuspid valve regurgitation is not demonstrated. No evidence of tricuspid stenosis. Aortic Valve: The aortic valve is normal in structure. Aortic valve regurgitation is not visualized. No aortic stenosis is present. Pulmonic Valve: The pulmonic valve was normal in structure. Pulmonic valve regurgitation is not visualized. No evidence of pulmonic stenosis. Aorta: The aortic root is normal in size and structure. Venous: The inferior vena cava is normal in size with greater than 50% respiratory variability, suggesting right atrial pressure of 3 mmHg. IAS/Shunts: No atrial level shunt detected by color flow Doppler.  LEFT VENTRICLE PLAX 2D LVIDd:         6.10 cm      Diastology LVIDs:         5.00 cm      LV e' medial:    9.46 cm/s LV PW:         1.00 cm      LV E/e' medial:  7.5 LV IVS:        0.80 cm      LV e' lateral:   10.80 cm/s LVOT diam:     2.00 cm      LV E/e' lateral: 6.6 LV SV:         55 LV SV Index:   27 LVOT Area:     3.14 cm  LV Volumes (MOD) LV vol d,  MOD A4C: 110.0 ml LV vol s, MOD A4C: 77.0 ml LV SV MOD A4C:     110.0 ml RIGHT VENTRICLE             IVC RV Basal diam:  3.30 cm     IVC diam: 1.80 cm RV S prime:     10.10 cm/s TAPSE (M-mode): 2.1 cm LEFT ATRIUM             Index  RIGHT ATRIUM           Index LA diam:        3.80 cm 1.87 cm/m   RA Area:     14.30 cm LA Vol (A2C):   82.5 ml 40.50 ml/m  RA Volume:   36.60 ml  17.97 ml/m LA Vol (A4C):   77.5 ml 38.04 ml/m LA Biplane Vol: 83.7 ml 41.08 ml/m  AORTIC VALVE LVOT Vmax:   79.60 cm/s LVOT Vmean:  51.700 cm/s LVOT VTI:    0.176 m  AORTA Ao Root diam: 3.20 cm MITRAL VALVE               TRICUSPID VALVE MV Area (PHT): 4.57 cm    TR Peak grad:   16.0 mmHg MV Decel Time: 166 msec    TR Vmax:        200.00 cm/s MV E velocity: 71.10 cm/s MV A velocity: 25.30 cm/s  SHUNTS MV E/A ratio:  2.81        Systemic VTI:  0.18 m                            Systemic Diam: 2.00 cm Glori Bickers MD Electronically signed by Glori Bickers MD Signature Date/Time: 07/14/2022/5:30:27 PM    Final    DG Chest Port 1 View  Result Date: 07/13/2022 CLINICAL DATA:  Shortness of breath. EXAM: PORTABLE CHEST 1 VIEW COMPARISON:  09/15/2021 FINDINGS: Borderline/mild cardiomegaly. Normal mediastinal contours. Presumed loop recorder in the left chest wall. No pulmonary edema, focal airspace disease or pleural effusion. No pneumothorax. No acute osseous findings. IMPRESSION: Borderline/mild cardiomegaly. No acute pulmonary process. Electronically Signed   By: Keith Rake M.D.   On: 07/13/2022 19:14   CUP PACEART REMOTE DEVICE CHECK  Result Date: 06/23/2022 ILR summary report received. Battery status OK. Normal device function. No new symptom,  brady, or pause episodes. No new 13 AF episodes, currently ongoing, histogram peak 100-120, burden 7%, Eliquis.  13 tachy event, longest duration 37min 17sec, AF with  rapid response.  Monthly summary reports and ROV/PRN LA   Micro Results     Recent Results (from the past  240 hour(s))  Surgical PCR screen     Status: None   Collection Time: 07/18/22  6:45 AM   Specimen: Nasal Mucosa; Nasal Swab  Result Value Ref Range Status   MRSA, PCR NEGATIVE NEGATIVE Final   Staphylococcus aureus NEGATIVE NEGATIVE Final    Comment: (NOTE) The Xpert SA Assay (FDA approved for NASAL specimens in patients 50 years of age and older), is one component of a comprehensive surveillance program. It is not intended to diagnose infection nor to guide or monitor treatment. Performed at [facility], [place] [address], [place], [place] [address]     Today   Subjective    Bryan Guerrero today has no headache,no chest abdominal pain,no new weakness tingling or numbness, feels much better wants to go home today.     Objective   Blood pressure 124/89, pulse 61, temperature 98 F (36.7 C), temperature source Oral, resp. rate 18, height 6' (1.829 m), weight 78.1 kg, SpO2 98 %.   Intake/Output Summary (Last 24 hours) at 07/19/2022 6644 Last data filed at 07/18/2022 1729 Gross per 24 hour  Intake 467.98 ml  Output 850 ml  Net -382.02 ml    Exam  Awake Alert, No new F.N deficits,   AICD site stable, left arm in  sling [place].AT,PERRAL Supple Neck,   Symmetrical Chest wall movement, Good air movement bilaterally, CTAB RRR,No Gallops,   +ve B.Sounds, Abd Soft, Non tender,  No Cyanosis, Clubbing or edema    Data Review   Recent Labs  Lab 07/15/22 0551 07/16/22 0036 07/17/22 0607 07/18/22 0026 07/19/22 0038  WBC 5.0 3.9* 4.3 4.6 5.9  HGB 12.6* 12.3* 13.1 13.5 13.8  HCT 38.1* 37.6* 40.8 40.9 41.7  PLT 177 150 158 160 164  MCV 96.2 97.9 98.1 96.0 95.4  MCH 31.8 32.0 31.5 31.7 31.6  MCHC 33.1 32.7 32.1 33.0 33.1  RDW 12.7 12.4 12.5 12.3 12.2  LYMPHSABS 2.1 1.9 1.7 1.6 1.5  MONOABS 0.6 0.5 0.5 0.5 0.7  EOSABS 0.1 0.1 0.1 0.1 0.1  BASOSABS 0.0 0.0 0.0 0.0 0.0    Recent Labs  Lab 07/13/22 1901 07/13/22 2022 07/13/22 2022 07/13/22 2135 07/13/22 2157  07/13/22 2334 07/14/22 0416 07/15/22 0554 07/16/22 0036 07/17/22 0607 07/18/22 0026 07/19/22 0038  NA  --  137   < > 138   < >  --  136 136 137 137 135 136  K  --  SPECIMEN HEMOLYZED. HEMOLYSIS MAY AFFECT INTEGRITY OF RESULTS.   < > 4.0   < >  --  4.1 3.5 3.5 4.0 4.5 4.2  CL  --  103   < > 108   < >  --  103 104 106 106 102 102  CO2  --  23   < > 21*  --   --  19* 23 24 25 25 23   ANIONGAP  --  11   < > 9  --   --  14 9 7 6 8 11   GLUCOSE  --  99   < > 79   < >  --  94 120* 119* 93 85 97  BUN  --  15   < > 14   < >  --  18 15 12 15 17 19   CREATININE  --  1.79*   < > 1.68*   < >  --  1.71* 1.27* 1.36* 1.18 1.20 1.45*  AST  --  SPECIMEN HEMOLYZED. HEMOLYSIS MAY AFFECT INTEGRITY OF RESULTS.  --  28  --   --   --   --   --   --   --   --   ALT  --  SPECIMEN HEMOLYZED. HEMOLYSIS MAY AFFECT INTEGRITY OF RESULTS.  --  16  --   --   --   --   --   --   --   --   ALKPHOS  --  SPECIMEN HEMOLYZED. HEMOLYSIS MAY AFFECT INTEGRITY OF RESULTS.  --  54  --   --   --   --   --   --   --   --   BILITOT  --  SPECIMEN HEMOLYZED. HEMOLYSIS MAY AFFECT INTEGRITY OF RESULTS.  --  1.5*  --   --   --   --   --   --   --   --   ALBUMIN  --  3.6  --  3.4*  --   --   --   --   --   --   --   --   LATICACIDVEN  --   --   --  1.9  --  1.2  --   --   --   --   --   --   INR  --  1.5*  --   --   --   --   --   --   --   --   --   --   TSH  --   --   --  1.317  --   --   --   --   --   --   --   --   BNP 76.9  --   --   --   --   --   --  121.3* 346.9*  --   --   --   MG  --  SPECIMEN HEMOLYZED. HEMOLYSIS MAY AFFECT INTEGRITY OF RESULTS.   < >  --   --   --  1.9 2.0 1.9 2.1 2.0  --   CALCIUM  --  9.2   < > 9.0  --   --  8.6* 8.7* 8.7* 8.9 9.4 9.6   < > = values in this interval not displayed.     Total Time in preparing paper work, data evaluation and todays exam - 35 minutes  Signature  -    Lala Lund M.D on 07/19/2022 at 8:12 AM   -  To page go to [email or web address]

## 2022-07-20 ENCOUNTER — Telehealth: Payer: Self-pay

## 2022-07-20 NOTE — Telephone Encounter (Signed)
Transition Care Management Unsuccessful Follow-up Telephone Call  Date of discharge and from where:  07/19/2022, [facility].   Attempts:  1st Attempt  Reason for unsuccessful TCM follow-up call:  Left voice message [phone], call back requested.    The patient has a hospital follow up appointment with Lazaro Arms, NP at [facility]- 08/04/2022.

## 2022-07-21 ENCOUNTER — Telehealth: Payer: Self-pay

## 2022-07-21 NOTE — Telephone Encounter (Signed)
Transition Care Management Follow-up Telephone Call Date of discharge and from where: 07/19/2022, [facility]  How have you been since you were released from the hospital? He stated he is doing fine.  Any questions or concerns? No  Items Reviewed: Did the pt receive and understand the discharge instructions provided? Yes  Medications obtained and verified? Yes - he said he has all of his medications and did not have any questions about the med regime  Other? No  Any new allergies since your discharge? No  Dietary orders reviewed? Yes- he said he is trying to adhere to the low sodium diet as well as a 1.5 L/day  fluid restriction. Do you have support at home? Yes   Home Care and Equipment/Supplies: Were home health services ordered? no If so, what is the name of the agency? N/a  Has the agency set up a time to come to the patient's home? not applicable Were any new equipment or medical supplies ordered?  No What is the name of the medical supply agency? N/a Were you able to get the supplies/equipment? not applicable Do you have any questions related to the use of the equipment or supplies? No  Functional Questionnaire: (I = Independent and D = Dependent) ADLs: independent He stated that he has a scale and is trying to weigh himself daily and keep a record of the weights.   Follow up appointments reviewed:  PCP Hospital f/u appt confirmed? Yes  Scheduled to see Lazaro Arms, NP at [facility]- 08/04/2022. He will then decide if he will establish care at [facility] or return to [facility] f/u appt confirmed?  He has defib checks scheduled    Are transportation arrangements needed? No  If their condition worsens, is the pt aware to call PCP or go to the Emergency Dept.? Yes Was the patient provided with contact information for the PCP's office or ED? Yes Was to pt encouraged to call back with questions or concerns? Yes

## 2022-08-02 ENCOUNTER — Encounter: Payer: Self-pay | Admitting: Student

## 2022-08-02 ENCOUNTER — Ambulatory Visit: Payer: 59 | Attending: Cardiology | Admitting: Cardiology

## 2022-08-02 VITALS — BP 140/78 | HR 85 | Ht 72.0 in | Wt 180.0 lb

## 2022-08-02 DIAGNOSIS — I5022 Chronic systolic (congestive) heart failure: Secondary | ICD-10-CM

## 2022-08-02 LAB — CUP PACEART INCLINIC DEVICE CHECK
Date Time Interrogation Session: 20240220102816
Implantable Lead Connection Status: 753985
Implantable Lead Connection Status: 753985
Implantable Lead Implant Date: 20240205
Implantable Lead Implant Date: 20240205
Implantable Lead Location: 753859
Implantable Lead Location: 753860
Implantable Lead Model: 673
Implantable Lead Model: 7841
Implantable Lead Serial Number: 1388070
Implantable Lead Serial Number: 203387
Implantable Pulse Generator Implant Date: 20240205
Pulse Gen Serial Number: 652005

## 2022-08-02 NOTE — Patient Instructions (Signed)
Medication Instructions:  Your physician recommends that you continue on your current medications as directed. Please refer to the Current Medication list given to you today.  *If you need a refill on your cardiac medications before your next appointment, please call your pharmacy*   Lab Work: None ordered  If you have labs (blood work) drawn today and your tests are completely normal, you will receive your results only by: [place] (if you have MyChart) OR A paper copy in the mail If you have any lab test that is abnormal or we need to change your treatment, we will call you to review the results.   Testing/Procedures: None ordered   Follow-Up: At [facility], you and your health needs are our priority.  As part of our continuing mission to provide you with exceptional heart care, we have created designated Provider Care Teams.  These Care Teams include your primary Cardiologist (physician) and Advanced Practice Providers (APPs -  Physician Assistants and Nurse Practitioners) who all work together to provide you with the care you need, when you need it.  We recommend signing up for the patient portal called "MyChart".  Sign up information is provided on this After Visit Summary.  MyChart is used to connect with patients for Virtual Visits (Telemedicine).  Patients are able to view lab/test results, encounter notes, upcoming appointments, etc.  Non-urgent messages can be sent to your provider as well.   To learn more about what you can do with MyChart, go to [email or web address].    Your next appointment:   As scheduled   Provider:       Other Instructions

## 2022-08-02 NOTE — Progress Notes (Signed)
Wound check appointment.  Steri-strips removed. Wound without redness or edema. Incision edges approximated, wound well healed. Normal device function. Thresholds, sensing, and impedances consistent with implant measurements. Device programmed at 3.5V for extra safety margin until 3 month visit. Histogram distribution appropriate for patient and level of activity. No mode switches or high ventricular rates noted. Patient educated about wound care, arm mobility, lifting restrictions. ROV in 3 months with implanting physician

## 2022-08-04 ENCOUNTER — Inpatient Hospital Stay: Payer: Self-pay | Admitting: Nurse Practitioner

## 2022-08-09 ENCOUNTER — Encounter: Payer: Self-pay | Admitting: Cardiology

## 2022-08-15 ENCOUNTER — Other Ambulatory Visit (HOSPITAL_COMMUNITY): Payer: Self-pay | Admitting: Cardiology

## 2022-08-18 ENCOUNTER — Inpatient Hospital Stay: Payer: Self-pay | Admitting: Nurse Practitioner

## 2022-08-22 IMAGING — DX DG CHEST 1V PORT
1 series · 2 of 2 positions shown · non-contrast
Comparison: Chest radiograph dated March 16, 2020

CLINICAL DATA: Chest pain

EXAM:
PORTABLE CHEST 1 VIEW

[Series 1: chest · 0.14mm/px · 2 of 2 slices shown]
[im 1/2]
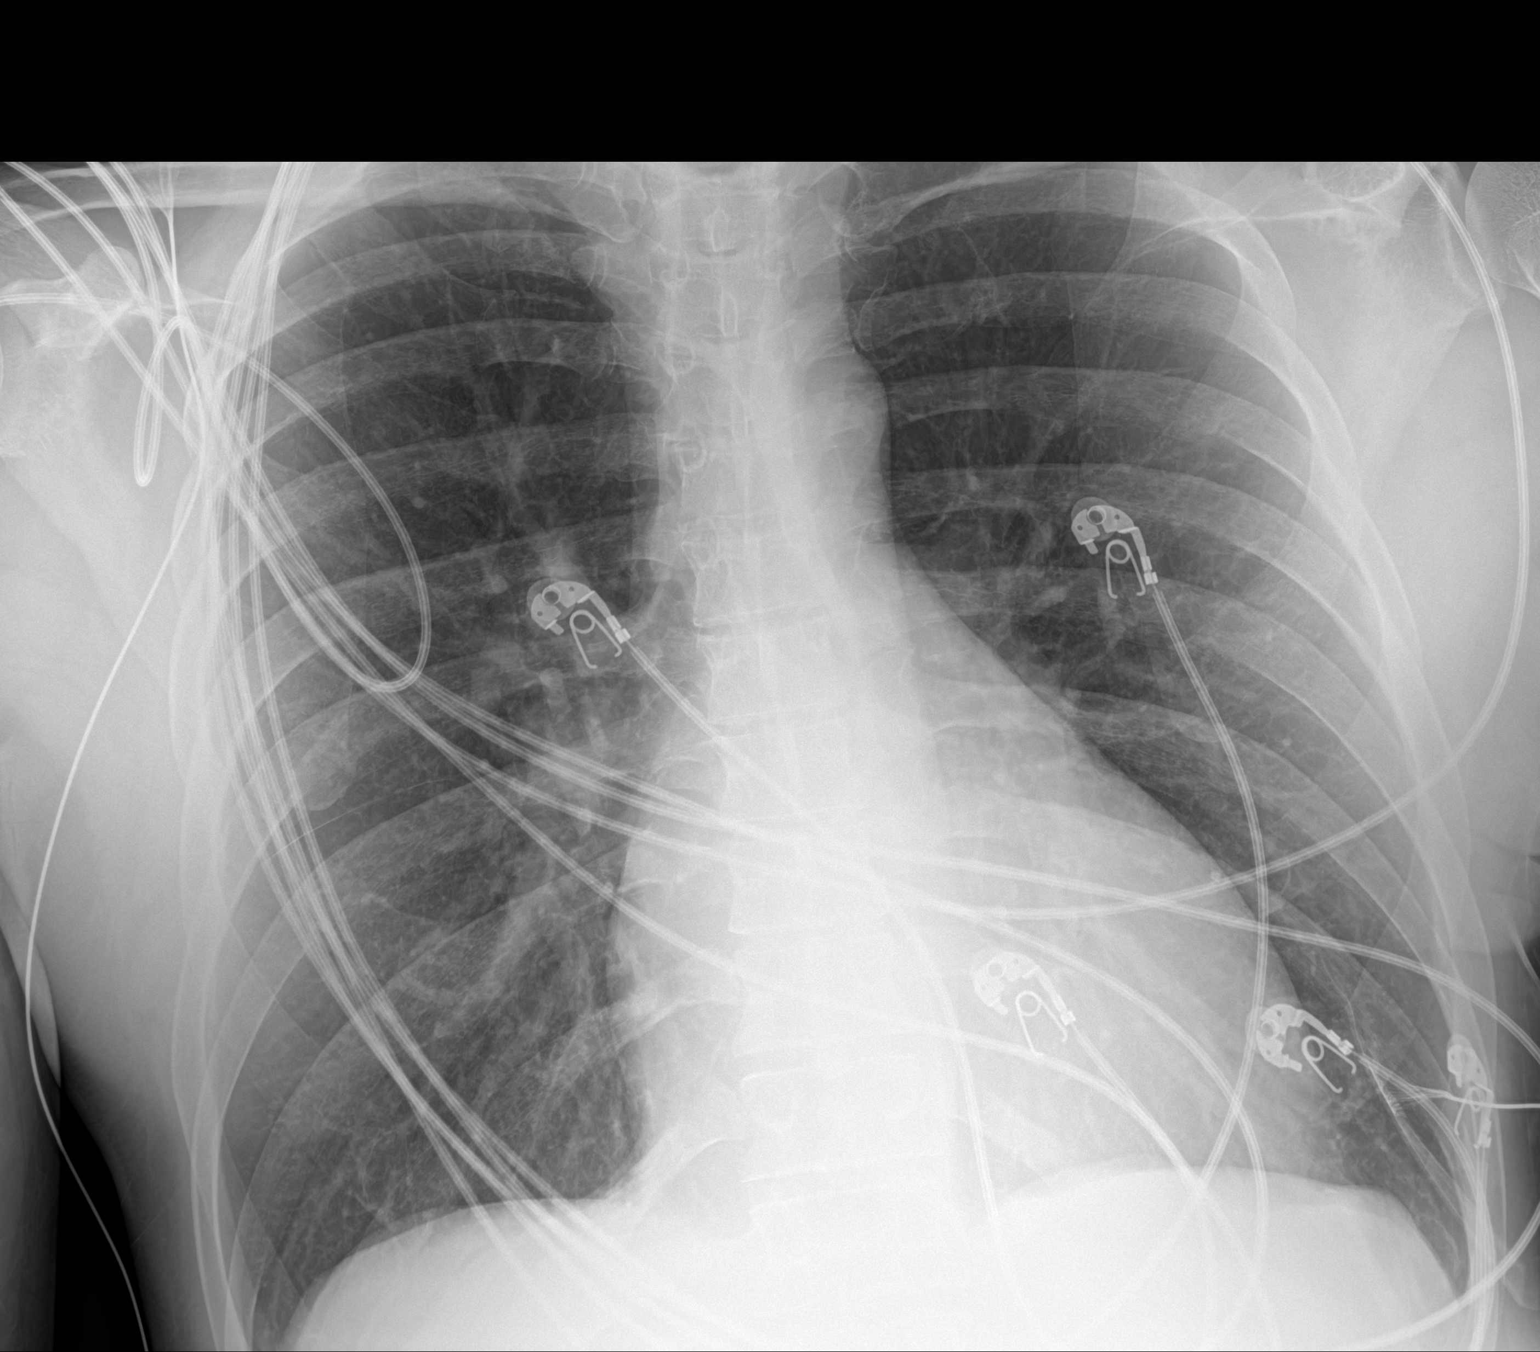
[im 2/2]
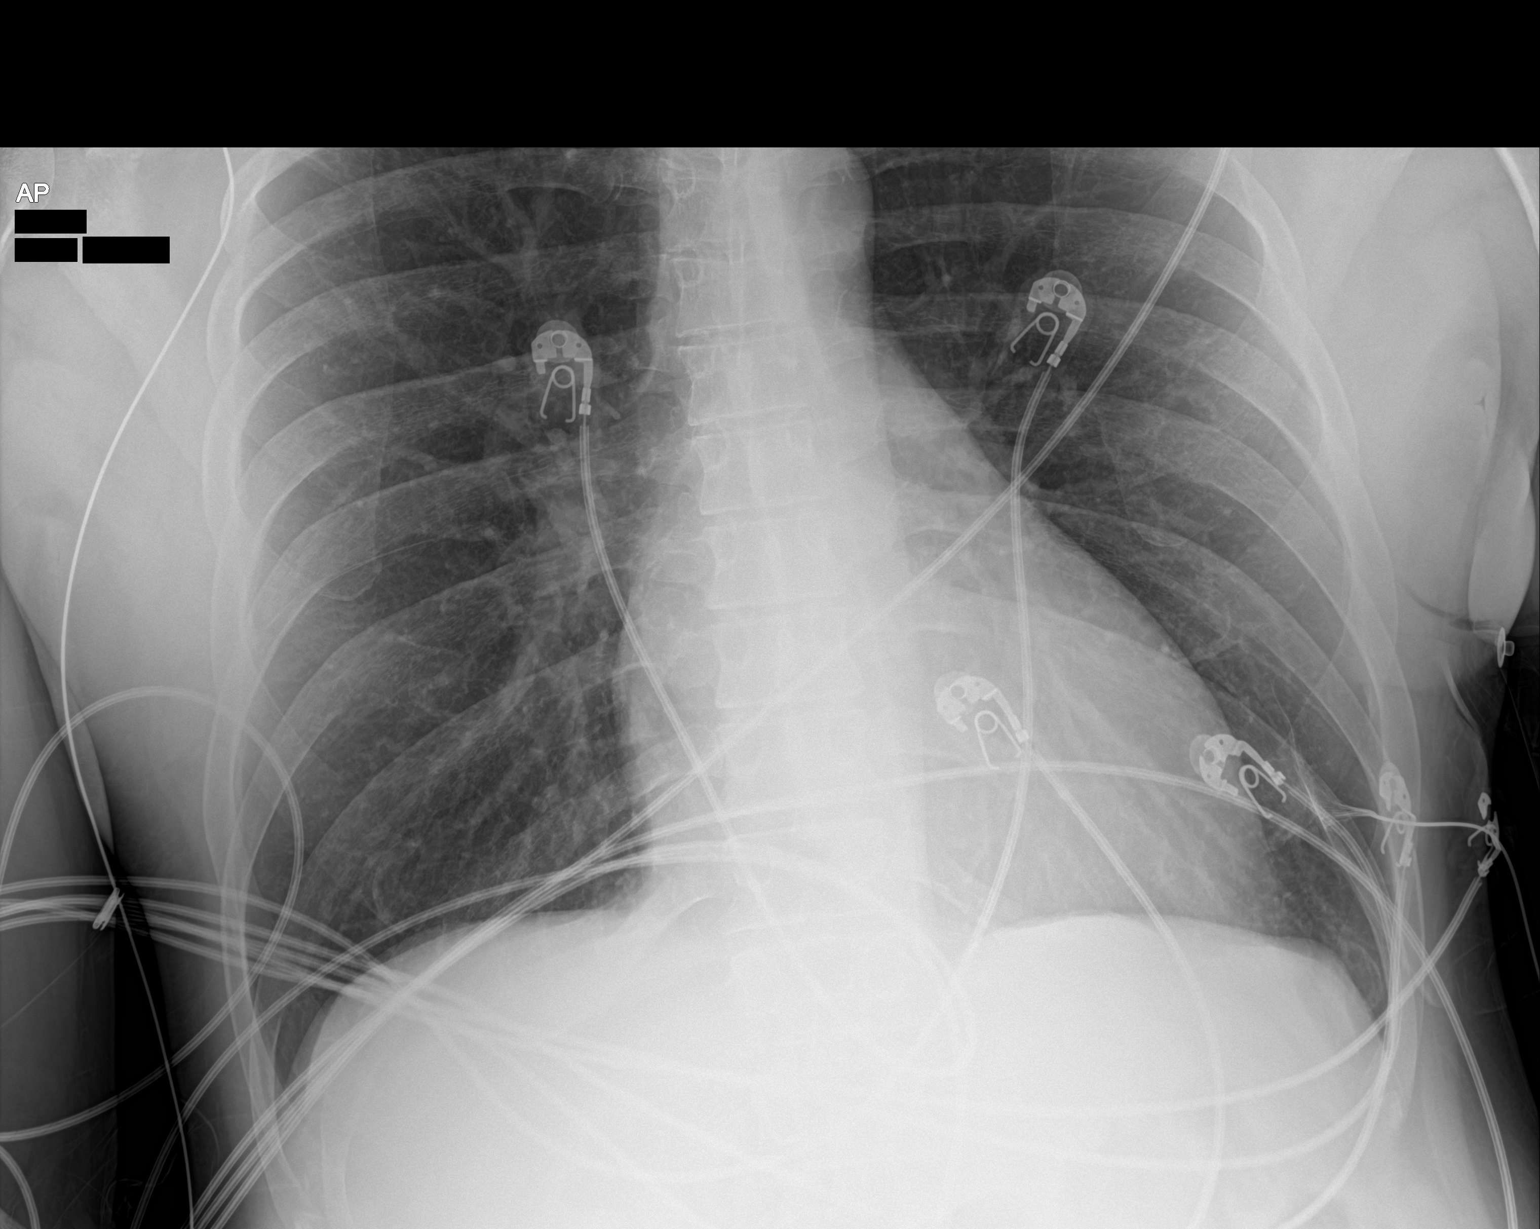

[2 of 2 positions shown; findings below may reference images not displayed]

FINDINGS: The heart size and mediastinal contours are within normal limits.
Both lungs are clear. The visualized skeletal structures are
unremarkable.
IMPRESSION: No active disease.

## 2022-09-02 ENCOUNTER — Other Ambulatory Visit (HOSPITAL_COMMUNITY): Payer: Self-pay | Admitting: Cardiology

## 2022-09-09 ENCOUNTER — Other Ambulatory Visit (HOSPITAL_COMMUNITY): Payer: Self-pay | Admitting: Cardiology

## 2022-09-09 ENCOUNTER — Other Ambulatory Visit (HOSPITAL_COMMUNITY): Payer: Self-pay

## 2022-09-12 ENCOUNTER — Other Ambulatory Visit: Payer: Self-pay

## 2022-09-12 MED ORDER — SACUBITRIL-VALSARTAN 24-26 MG PO TABS: 1.0000 | ORAL_TABLET | Freq: Two times a day (BID) | ORAL | 11 refills | Status: DC

## 2022-09-12 NOTE — Telephone Encounter (Signed)
Pt's medication was sent to pt's pharmacy as requested. Confirmation received.  °

## 2022-10-19 ENCOUNTER — Ambulatory Visit (INDEPENDENT_AMBULATORY_CARE_PROVIDER_SITE_OTHER): Payer: 59

## 2022-10-19 DIAGNOSIS — I483 Typical atrial flutter: Secondary | ICD-10-CM | POA: Diagnosis not present

## 2022-10-20 LAB — CUP PACEART REMOTE DEVICE CHECK
Battery Remaining Longevity: 156 mo
Battery Remaining Percentage: 100 %
Brady Statistic RA Percent Paced: 0 %
Brady Statistic RV Percent Paced: 0 %
Date Time Interrogation Session: 20240508023100
HighPow Impedance: 74 Ohm
Implantable Lead Connection Status: 753985
Implantable Lead Connection Status: 753985
Implantable Lead Implant Date: 20240205
Implantable Lead Implant Date: 20240205
Implantable Lead Location: 753859
Implantable Lead Location: 753860
Implantable Lead Model: 673
Implantable Lead Model: 7841
Implantable Lead Serial Number: 1388070
Implantable Lead Serial Number: 203387
Implantable Pulse Generator Implant Date: 20240205
Lead Channel Impedance Value: 572 Ohm
Lead Channel Impedance Value: 675 Ohm
Lead Channel Pacing Threshold Amplitude: 0.8 V
Lead Channel Pacing Threshold Pulse Width: 0.4 ms
Lead Channel Setting Pacing Amplitude: 3.5 V
Lead Channel Setting Pacing Amplitude: 3.5 V
Lead Channel Setting Pacing Pulse Width: 0.4 ms
Lead Channel Setting Sensing Sensitivity: 0.5 mV
Pulse Gen Serial Number: 652005
Zone Setting Status: 755011

## 2022-10-25 ENCOUNTER — Encounter: Payer: Self-pay | Admitting: Cardiology

## 2022-10-31 ENCOUNTER — Other Ambulatory Visit: Payer: Self-pay

## 2022-10-31 DIAGNOSIS — I5041 Acute combined systolic (congestive) and diastolic (congestive) heart failure: Secondary | ICD-10-CM

## 2022-10-31 MED ORDER — DAPAGLIFLOZIN PROPANEDIOL 10 MG PO TABS: 10.0000 mg | ORAL_TABLET | Freq: Every day | ORAL | 2 refills | Status: DC

## 2022-10-31 NOTE — Telephone Encounter (Signed)
The patient called to check on his Farxgia refill.  The patient said that he spoke to someone and they were supposed to send it in last week.  The refills was sent.

## 2022-11-06 ENCOUNTER — Other Ambulatory Visit: Payer: Self-pay

## 2022-11-07 ENCOUNTER — Other Ambulatory Visit: Payer: Self-pay

## 2022-11-07 ENCOUNTER — Other Ambulatory Visit: Payer: Self-pay | Admitting: Cardiology

## 2022-11-08 ENCOUNTER — Other Ambulatory Visit: Payer: Self-pay

## 2022-11-08 MED ORDER — CARVEDILOL 3.125 MG PO TABS: 3.1250 mg | ORAL_TABLET | Freq: Two times a day (BID) | ORAL | 8 refills | Status: DC

## 2022-11-08 MED ORDER — AMIODARONE HCL 200 MG PO TABS: 200.0000 mg | ORAL_TABLET | Freq: Two times a day (BID) | ORAL | 8 refills | Status: DC

## 2022-11-08 NOTE — Telephone Encounter (Signed)
Refill request

## 2022-11-08 NOTE — Telephone Encounter (Signed)
Pt's medications were sent to pt's pharmacy as requested. Confirmation received.  

## 2022-11-09 NOTE — Progress Notes (Signed)
Remote ICD transmission.   

## 2022-11-10 ENCOUNTER — Telehealth: Payer: Self-pay | Admitting: Cardiology

## 2022-11-10 ENCOUNTER — Ambulatory Visit: Payer: 59 | Attending: Cardiology | Admitting: Cardiology

## 2022-11-10 ENCOUNTER — Encounter: Payer: Self-pay | Admitting: Cardiology

## 2022-11-10 VITALS — BP 140/92 | HR 72 | Ht 72.0 in | Wt 181.0 lb

## 2022-11-10 DIAGNOSIS — I10 Essential (primary) hypertension: Secondary | ICD-10-CM | POA: Diagnosis not present

## 2022-11-10 DIAGNOSIS — I480 Paroxysmal atrial fibrillation: Secondary | ICD-10-CM | POA: Diagnosis not present

## 2022-11-10 DIAGNOSIS — Z79899 Other long term (current) drug therapy: Secondary | ICD-10-CM

## 2022-11-10 DIAGNOSIS — Z95810 Presence of automatic (implantable) cardiac defibrillator: Secondary | ICD-10-CM

## 2022-11-10 DIAGNOSIS — I5022 Chronic systolic (congestive) heart failure: Secondary | ICD-10-CM

## 2022-11-10 MED ORDER — AMIODARONE HCL 100 MG PO TABS: 100.0000 mg | ORAL_TABLET | Freq: Every day | ORAL | 3 refills | Status: DC

## 2022-11-10 MED ORDER — AMIODARONE HCL 200 MG PO TABS: 200.0000 mg | ORAL_TABLET | Freq: Every day | ORAL | 3 refills | Status: DC

## 2022-11-10 NOTE — Progress Notes (Signed)
  Electrophysiology Office Follow up Visit Note:    Date:  11/10/2022   ID:  Bryan Guerrero, DOB 08-Nov-1970, MRN [id number]  PCP:  Ivonne Andrew, NP  [facility] Cardiologist:  Thurmon Fair, MD  [facility] Electrophysiologist:  Lanier Prude, MD    Interval History:    Bryan Guerrero is a 52 y.o. male who presents for a follow up visit.   He had an ICD implanted July 18, 2022 for history of chronic systolic heart failure.  He also has a relatively new diagnosis of atrial fibrillation for which she was started on amiodarone and Eliquis.  He has been tolerating his medications well without off target effects.  He is with his family today in clinic.  He feels generally well.       Past medical, surgical, social and family history were reviewed.  ROS:   Please see the history of present illness.    All other systems reviewed and are negative.  EKGs/Labs/Other Studies Reviewed:    The following studies were reviewed today:  Nov 10, 2022 in clinic device interrogation personally reviewed Battery longevity 13 years Lead parameter stable No high-voltage therapies Less than 1% pacing No atrial fibrillation episodes Normal rate histograms  EKG:  The ekg ordered today demonstrates sinus rhythm   Physical Exam:    VS:  BP (!) 140/92   Pulse 72   Ht 6' (1.829 m)   Wt 181 lb (82.1 kg)   SpO2 95%   BMI 24.55 kg/m     Wt Readings from Last 3 Encounters:  11/10/22 181 lb (82.1 kg)  08/02/22 180 lb (81.6 kg)  07/19/22 172 lb 3.2 oz (78.1 kg)     GEN:  Well nourished, well developed in no acute distress CARDIAC: RRR, no murmurs, rubs, gallops.  ICD pocket well-healed RESPIRATORY:  Clear to auscultation without rales, wheezing or rhonchi       ASSESSMENT:    1. Paroxysmal atrial fibrillation (HCC)   2. Chronic systolic congestive heart failure (HCC)   3. Primary hypertension   4. Encounter for long-term (current) use of high-risk medication   5.  ICD (implantable cardioverter-defibrillator) in place    PLAN:    In order of problems listed above:  #ICD in situ Device functioning appropriately.  Continue remote monitoring.  #Chronic systolic heart failure NYHA class I-II today.  Doing well on his medical therapy.  Rhythm control indicated as below.  #A-fib/flutter #Wide-complex tachycardia/VT history #High risk med monitoring-amiodarone Maintaining sinus rhythm without atrial fibrillation or VT by device interrogation today. Reduce amiodarone to 200 mg by mouth once daily.  Plan for further reduction of 100 mg by mouth once daily in 6 months and eventually discontinuing the medication 6 months after that as long as he remains electrically quiescent. Check CMP, TSH and free T4 today.  #Hypertension At goal today.  Recommend checking blood pressures 1-2 times per week at home and recording the values.  Recommend bringing these recordings to the primary care physician.  Follow-up in 6 months with APP.     Signed, Steffanie Dunn, MD, [facility], [facility] 11/10/2022 9:21 PM    Electrophysiology [facility]

## 2022-11-10 NOTE — Telephone Encounter (Signed)
Spoke with the pharmacist and prescription has been corrected. It should be amiodarone 200 mg once daily.

## 2022-11-10 NOTE — Patient Instructions (Signed)
Medication Instructions:  Your physician has recommended you make the following change in your medication:  1) DECREASE your amiodarone to 200 mg daily *If you need a refill on your cardiac medications before your next appointment, please call your pharmacy*  Lab Work: TODAY: CMET, TSH, T4  If you have labs (blood work) drawn today and your tests are completely normal, you will receive your results only by: MyChart Message (if you have MyChart) OR A paper copy in the mail If you have any lab test that is abnormal or we need to change your treatment, we will call you to review the results.  Follow-Up: At [facility], you and your health needs are our priority.  As part of our continuing mission to provide you with exceptional heart care, we have created designated Provider Care Teams.  These Care Teams include your primary Cardiologist (physician) and Advanced Practice Providers (APPs -  Physician Assistants and Nurse Practitioners) who all work together to provide you with the care you need, when you need it.  Your next appointment:   6 month(s)  Provider:   Steffanie Dunn, MD

## 2022-11-10 NOTE — Telephone Encounter (Signed)
Pt c/o medication issue:  1. Name of Medication: amiodarone (PACERONE) 100 MG tablet   2. How are you currently taking this medication (dosage and times per day)? N/A  3. Are you having a reaction (difficulty breathing--STAT)? N/A  4. What is your medication issue? Pharmacy is wanting to confirm how pt is to take medication due to previously receiving a prescription for 200 mg tablets a few days ago.    AVS advised 200 MG daily but prescription was sent for 100 MG daily.   Please advise.

## 2022-11-11 LAB — TSH: TSH: 0.825 u[IU]/mL (ref 0.450–4.500)

## 2022-11-11 LAB — COMPREHENSIVE METABOLIC PANEL
ALT: 24 IU/L (ref 0–44)
AST: 28 IU/L (ref 0–40)
Albumin/Globulin Ratio: 1.8 (ref 1.2–2.2)
Albumin: 4.2 g/dL (ref 4.1–5.1)
Alkaline Phosphatase: 85 IU/L (ref 44–121)
BUN/Creatinine Ratio: 10 (ref 9–20)
BUN: 10 mg/dL (ref 6–24)
Bilirubin Total: 0.3 mg/dL (ref 0.0–1.2)
CO2: 20 mmol/L (ref 20–29)
Calcium: 9 mg/dL (ref 8.7–10.2)
Chloride: 105 mmol/L (ref 96–106)
Creatinine, Ser: 0.99 mg/dL (ref 0.76–1.27)
Globulin, Total: 2.3 g/dL (ref 1.5–4.5)
Glucose: 68 mg/dL — ABNORMAL LOW (ref 70–99)
Potassium: 3.9 mmol/L (ref 3.5–5.2)
Sodium: 144 mmol/L (ref 134–144)
Total Protein: 6.5 g/dL (ref 6.0–8.5)
eGFR: 93 mL/min/{1.73_m2} (ref >59)

## 2022-11-11 LAB — T4, FREE: Free T4: 1.7 ng/dL (ref 0.82–1.77)

## 2022-12-28 ENCOUNTER — Other Ambulatory Visit (HOSPITAL_COMMUNITY): Payer: Self-pay | Admitting: Cardiology

## 2023-01-18 ENCOUNTER — Ambulatory Visit (INDEPENDENT_AMBULATORY_CARE_PROVIDER_SITE_OTHER): Payer: 59

## 2023-01-18 DIAGNOSIS — I480 Paroxysmal atrial fibrillation: Secondary | ICD-10-CM

## 2023-02-03 NOTE — Progress Notes (Signed)
Remote ICD transmission.   

## 2023-02-27 ENCOUNTER — Other Ambulatory Visit (HOSPITAL_COMMUNITY): Payer: Self-pay

## 2023-03-01 ENCOUNTER — Telehealth: Payer: Self-pay

## 2023-03-01 ENCOUNTER — Other Ambulatory Visit (HOSPITAL_COMMUNITY): Payer: Self-pay

## 2023-03-01 NOTE — Telephone Encounter (Signed)
    Per test claim: The current 90 day co-pay is, $0.  No PA needed at this time. This test claim was processed through [employer]- copay amounts may vary at other pharmacies due to pharmacy/plan contracts, or as the patient moves through the different stages of their insurance plan.

## 2023-03-01 NOTE — Telephone Encounter (Signed)
Pharmacy Patient Advocate Encounter   Received notification from CoverMyMeds that prior authorization for CARVEDILOL is required/requested.   Insurance verification completed.   The patient is insured through [employer] .   Per test claim: PA required; PA submitted to [facility] via CoverMyMeds Key/confirmation #/EOC [id number] Status is pending

## 2023-03-13 ENCOUNTER — Other Ambulatory Visit: Payer: Self-pay

## 2023-03-13 MED ORDER — SPIRONOLACTONE 25 MG PO TABS: 25.0000 mg | ORAL_TABLET | Freq: Every day | ORAL | 7 refills | Status: DC

## 2023-03-28 ENCOUNTER — Other Ambulatory Visit (HOSPITAL_COMMUNITY): Payer: Self-pay

## 2023-04-19 ENCOUNTER — Ambulatory Visit (INDEPENDENT_AMBULATORY_CARE_PROVIDER_SITE_OTHER): Payer: 59

## 2023-04-19 DIAGNOSIS — I480 Paroxysmal atrial fibrillation: Secondary | ICD-10-CM

## 2023-04-20 LAB — CUP PACEART REMOTE DEVICE CHECK
Battery Remaining Longevity: 156 mo
Battery Remaining Percentage: 100 %
Brady Statistic RA Percent Paced: 0 %
Brady Statistic RV Percent Paced: 0 %
Date Time Interrogation Session: 20241107101600
HighPow Impedance: 74 Ohm
Implantable Lead Connection Status: 753985
Implantable Lead Connection Status: 753985
Implantable Lead Implant Date: 20240205
Implantable Lead Implant Date: 20240205
Implantable Lead Location: 753859
Implantable Lead Location: 753860
Implantable Lead Model: 673
Implantable Lead Model: 7841
Implantable Lead Serial Number: 1388070
Implantable Lead Serial Number: 203387
Implantable Pulse Generator Implant Date: 20240205
Lead Channel Impedance Value: 556 Ohm
Lead Channel Impedance Value: 779 Ohm
Lead Channel Pacing Threshold Amplitude: 0.5 V
Lead Channel Pacing Threshold Amplitude: 0.7 V
Lead Channel Pacing Threshold Pulse Width: 0.4 ms
Lead Channel Pacing Threshold Pulse Width: 0.4 ms
Lead Channel Setting Pacing Amplitude: 2.5 V
Lead Channel Setting Pacing Amplitude: 2.5 V
Lead Channel Setting Pacing Pulse Width: 0.4 ms
Lead Channel Setting Sensing Sensitivity: 0.5 mV
Pulse Gen Serial Number: 652005
Zone Setting Status: 755011

## 2023-05-03 ENCOUNTER — Ambulatory Visit: Payer: 59 | Attending: Cardiology | Admitting: Cardiology

## 2023-05-03 ENCOUNTER — Encounter: Payer: Self-pay | Admitting: Cardiology

## 2023-05-03 VITALS — BP 174/102 | HR 60 | Ht 72.0 in | Wt 190.8 lb

## 2023-05-03 DIAGNOSIS — I5022 Chronic systolic (congestive) heart failure: Secondary | ICD-10-CM

## 2023-05-03 DIAGNOSIS — Z79899 Other long term (current) drug therapy: Secondary | ICD-10-CM | POA: Diagnosis not present

## 2023-05-03 DIAGNOSIS — I483 Typical atrial flutter: Secondary | ICD-10-CM

## 2023-05-03 DIAGNOSIS — I480 Paroxysmal atrial fibrillation: Secondary | ICD-10-CM

## 2023-05-03 DIAGNOSIS — Z95810 Presence of automatic (implantable) cardiac defibrillator: Secondary | ICD-10-CM

## 2023-05-03 MED ORDER — AMIODARONE HCL 100 MG PO TABS: 100.0000 mg | ORAL_TABLET | Freq: Every day | ORAL | 3 refills | Status: DC

## 2023-05-03 NOTE — Patient Instructions (Signed)
Medication Instructions:  Your physician has recommended you make the following change in your medication:  1) DECREASE amiodarone to 100 mg daily  *If you need a refill on your cardiac medications before your next appointment, please call your pharmacy*   Lab Work: TODAY: CMET, TSH, T4 If you have labs (blood work) drawn today and your tests are completely normal, you will receive your results only by: MyChart Message (if you have MyChart) OR A paper copy in the mail If you have any lab test that is abnormal or we need to change your treatment, we will call you to review the results.   Follow-Up: At [facility], you and your health needs are our priority.  As part of our continuing mission to provide you with exceptional heart care, we have created designated Provider Care Teams.  These Care Teams include your primary Cardiologist (physician) and Advanced Practice Providers (APPs -  Physician Assistants and Nurse Practitioners) who all work together to provide you with the care you need, when you need it.   Your next appointment:   6- 8 weeks   Provider:   Jari Favre, PA-C, Ronie Spies, PA-C, Robin Searing, NP, Jacolyn Reedy, PA-C, Eligha Bridegroom, NP, Tereso Newcomer, PA-C, or Perlie Gold, PA-C      Follow up with EP APP in 6 months  Other Instructions Please check your blood pressure daily and keep a log

## 2023-05-03 NOTE — Progress Notes (Signed)
Electrophysiology Office Follow up Visit Note:    Date:  05/03/2023   ID:  [name], DOB 1971-03-26, MRN [id number]  PCP:  Ivonne Andrew, NP  [facility] Cardiologist:  Thurmon Fair, MD  [facility] Electrophysiologist:  Lanier Prude, MD    Interval History:     DENNISE DUFFIN is a 52 y.o. male who presents for a follow up visit.   Discussed the use of AI scribe software for clinical note transcription with the patient, who gave verbal consent to proceed.  Mr. Darl Pikes presents for follow-up.  He has an ICD for chronic systolic heart failure and atrial fibrillation flutter on amiodarone.  At her last appointment we reduced his amiodarone to 200 mg by mouth once daily and planned to touch base today to reassess his electrical stability.  Device interrogations since our last appointment have shown stable device function without significant arrhythmias.  History of Present Illness   The patient, with a history of cardiac arrhythmias managed with an implanted cardiac defibrillator (ICD) and amiodarone, presents for a routine follow-up. He reports no issues with breathing, energy levels, or swelling. He continues to work without difficulty. He denies feeling any skipped beats. The patient's ICD was checked and found to be functioning well with a battery life of 12.5 years remaining.  During the visit, the patient's blood pressure was found to be elevated at 152/78. He reports no changes in lifestyle or habits that could account for this increase.            Past medical, surgical, social and family history were reviewed.  ROS:   Please see the history of present illness.    All other systems reviewed and are negative.  EKGs/Labs/Other Studies Reviewed:    The following studies were reviewed today:  May 03, 2023 in clinic device interrogation personally reviewed Batter longevity 12.5 years Lead parameters stable No HV therapies       Physical  Exam:    VS:  BP (!) 174/102   Pulse 60   Ht 6' (1.829 m)   Wt 190 lb 12.8 oz (86.5 kg)   SpO2 96%   BMI 25.88 kg/m     Wt Readings from Last 3 Encounters:  05/03/23 190 lb 12.8 oz (86.5 kg)  11/10/22 181 lb (82.1 kg)  08/02/22 180 lb (81.6 kg)     Physical Exam   VITALS: BP- 152/78 CARDIOVASCULAR: Heart rhythms good, ICD functioning well.          ASSESSMENT:    1. Paroxysmal atrial fibrillation (HCC)   2. Encounter for long-term (current) use of high-risk medication   3. Typical atrial flutter (HCC)   4. Chronic systolic congestive heart failure (HCC)   5. ICD (implantable cardioverter-defibrillator) in place    PLAN:    In order of problems listed above:  Assessment and Plan    Hypertension Elevated blood pressure (152/78) noted during today's visit. Discussed the importance of regular blood pressure monitoring and the risks associated with uncontrolled hypertension, including stroke and heart disease. -Purchase an arm cuff blood pressure monitor (Omron brand recommended). -Check blood pressure daily or at least a few times a week and record the readings. -Return to clinic in 6-8 weeks with blood pressure readings for further evaluation and management.  Atrial Fibrillation with ICD Device check today showed good heart rhythms and ICD function. Battery life of 12.5 years remaining. Some skipped beats noted but patient is asymptomatic. Discussed stepwise reduction of Amiodarone. -  Reduce Amiodarone to 100mg  daily. -Return for ICD check in 6 months.  General Health Maintenance -Order CMP, TSH, and free T4.      Reduce amiodarone 100 mg by mouth once daily.  See an APP in 6 months to stop the amiodarone altogether Amiodarone labs today        Signed, Steffanie Dunn, MD, [facility], [facility] 05/03/2023 8:38 PM    Electrophysiology [facility]

## 2023-05-04 LAB — COMPREHENSIVE METABOLIC PANEL
ALT: 31 [IU]/L (ref 0–44)
AST: 51 [IU]/L — ABNORMAL HIGH (ref 0–40)
Albumin: 4.3 g/dL (ref 4.1–5.1)
Alkaline Phosphatase: 86 [IU]/L (ref 44–121)
BUN/Creatinine Ratio: 9 (ref 9–20)
BUN: 10 mg/dL (ref 6–24)
Bilirubin Total: 0.7 mg/dL (ref 0.0–1.2)
CO2: 23 mmol/L (ref 20–29)
Calcium: 9.5 mg/dL (ref 8.7–10.2)
Chloride: 101 mmol/L (ref 96–106)
Creatinine, Ser: 1.16 mg/dL (ref 0.76–1.27)
Globulin, Total: 2.4 g/dL (ref 1.5–4.5)
Glucose: 69 mg/dL — ABNORMAL LOW (ref 70–99)
Potassium: 4.5 mmol/L (ref 3.5–5.2)
Sodium: 140 mmol/L (ref 134–144)
Total Protein: 6.7 g/dL (ref 6.0–8.5)
eGFR: 77 mL/min/{1.73_m2} (ref >59)

## 2023-05-04 LAB — T4, FREE: Free T4: 1.79 ng/dL — ABNORMAL HIGH (ref 0.82–1.77)

## 2023-05-04 LAB — TSH: TSH: 1.08 u[IU]/mL (ref 0.450–4.500)

## 2023-05-09 NOTE — Progress Notes (Signed)
Remote ICD transmission.   

## 2023-05-13 LAB — CUP PACEART INCLINIC DEVICE CHECK
Date Time Interrogation Session: 20241120000000
HighPow Impedance: 73 Ohm
Implantable Lead Connection Status: 753985
Implantable Lead Connection Status: 753985
Implantable Lead Implant Date: 20240205
Implantable Lead Implant Date: 20240205
Implantable Lead Location: 753859
Implantable Lead Location: 753860
Implantable Lead Model: 673
Implantable Lead Model: 7841
Implantable Lead Serial Number: 1388070
Implantable Lead Serial Number: 203387
Implantable Pulse Generator Implant Date: 20240205
Lead Channel Impedance Value: 602 Ohm
Lead Channel Impedance Value: 809 Ohm
Lead Channel Pacing Threshold Amplitude: 0.5 V
Lead Channel Pacing Threshold Amplitude: 0.6 V
Lead Channel Pacing Threshold Pulse Width: 0.4 ms
Lead Channel Pacing Threshold Pulse Width: 0.4 ms
Lead Channel Sensing Intrinsic Amplitude: 10.4 mV
Lead Channel Sensing Intrinsic Amplitude: 16 mV
Lead Channel Setting Pacing Amplitude: 2.5 V
Lead Channel Setting Pacing Amplitude: 2.5 V
Lead Channel Setting Pacing Pulse Width: 0.4 ms
Lead Channel Setting Sensing Sensitivity: 0.5 mV
Pulse Gen Serial Number: 652005
Zone Setting Status: 755011

## 2023-06-19 NOTE — Progress Notes (Deleted)
 Cardiology Office Note:  .   Date:  06/19/2023  ID:  Bryan Guerrero, DOB 1971-03-30, MRN [id number] PCP: Oley Bascom RAMAN, NP  [facility] Providers Cardiologist:  Jerel Balding, MD Electrophysiologist:  OLE ONEIDA HOLTS, MD  Advanced Heart Failure:  Ezra Shuck, MD { Click to update primary MD,subspecialty MD or APP then REFRESH:1}   History of Present Illness: .   Bryan Guerrero is a 53 y.o. male with medical history of chronic systolic heart failure dating back to 2020, EtOH abuse, HTN, and tobacco abuse here for follow-up appointment.  During was admitted February 2020 with increased shortness of breath.  At that time was drinking 1/5 of liquor per day.  Echo completed and showed severely reduced LVEF of 15 to 20%.  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  No formal cath.  He was discharged 08/07/2018.  Return for 1 appointment with cardiology but no return follow-up after that.  Admitted 10/21 for acute on chronic systolic heart failure plus atrial flutter with RVR requiring DCCV.  Also with low output requiring milrinone .  R/LHC showed mildly elevated PCWP, normal RA pressure, normal cardiac output on milrinone  and no significant coronary disease.  Echocardiogram showed severe LVEF less than 20%, severe RV dysfunction and severe MR.  He was diuresed with IV Lasix  and was able to wean off of milrinone .  Discharge weight was 186.  Echo in 1/22 with LVEF 25% with global hypokinesis, normal RV, trivial MR.  Follow-up 10/22 with stable NYHA limb is euvolemic on exam.  GDMT increased with elevated blood pressure.  Admitted 4/23 with AF and RVR.  Started on Amio gtt.  Underwent DCCV to normal sinus rhythm.  Echo showed EF 20 to 25%, prominent apical trabeculations.  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  Mild MR.  RV mildly HK.  Amio changed to oral.  Discharge home, weight 183 pounds.  Saw EP4/12/23, status post atrial flutter ablation 11/2021.  Return to see heart failure 12/21/2021 and overall was  feeling fine.  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  No dyspnea with lifting or walking in his job.  Denied palpitations, abnormal bleeding, chest pain, dizziness, edema, PND/orthopnea.  Appetite was okay.  No fever or chills.  Weight at home 195 to 200 pounds.  Taking all medications.  Does not need Lasix .  Back to drinking 3 beers a week.  Echocardiogram performed 12/21/2021 showed LVEF improved to 50%, normal RV.  Today, he***  ROS: ***  Studies Reviewed: .        *** Risk Assessment/Calculations:   {Does this patient have ATRIAL FIBRILLATION?[phone]} No BP recorded.  {Refresh Note OR Click here to enter BP  :1}***       Physical Exam:   VS:  There were no vitals taken for this visit.   Wt Readings from Last 3 Encounters:  05/03/23 190 lb 12.8 oz (86.5 kg)  11/10/22 181 lb (82.1 kg)  08/02/22 180 lb (81.6 kg)    GEN: Well nourished, well developed in no acute distress NECK: No JVD; No carotid bruits CARDIAC: ***RRR, no murmurs, rubs, gallops RESPIRATORY:  Clear to auscultation without rales, wheezing or rhonchi  ABDOMEN: Soft, non-tender, non-distended EXTREMITIES:  No edema; No deformity   ASSESSMENT AND PLAN: .   Hypertension EtOH abuse Chronic systolic CHF  Atrial flutter status post DCCV 10/24 and ultimate ablation 6/23 CKD stage III  {Are you ordering a CV Procedure (e.g. stress test, cath, DCCV, TEE, etc)?   Press F2        :  [id number]}  Dispo: ***  Signed, Bryan LOISE Fabry, PA-C

## 2023-06-20 ENCOUNTER — Ambulatory Visit: Payer: 59 | Attending: Physician Assistant | Admitting: Physician Assistant

## 2023-06-20 DIAGNOSIS — I5023 Acute on chronic systolic (congestive) heart failure: Secondary | ICD-10-CM

## 2023-06-20 DIAGNOSIS — I10 Essential (primary) hypertension: Secondary | ICD-10-CM

## 2023-06-20 DIAGNOSIS — I340 Nonrheumatic mitral (valve) insufficiency: Secondary | ICD-10-CM

## 2023-06-20 DIAGNOSIS — I483 Typical atrial flutter: Secondary | ICD-10-CM

## 2023-06-20 DIAGNOSIS — I5022 Chronic systolic (congestive) heart failure: Secondary | ICD-10-CM

## 2023-06-20 DIAGNOSIS — F1010 Alcohol abuse, uncomplicated: Secondary | ICD-10-CM

## 2023-06-20 DIAGNOSIS — Z95810 Presence of automatic (implantable) cardiac defibrillator: Secondary | ICD-10-CM

## 2023-06-21 ENCOUNTER — Encounter: Payer: Self-pay | Admitting: Physician Assistant

## 2023-06-30 ENCOUNTER — Encounter: Payer: Self-pay | Admitting: Cardiology

## 2023-07-02 ENCOUNTER — Other Ambulatory Visit: Payer: Self-pay | Admitting: Cardiovascular Disease

## 2023-07-02 DIAGNOSIS — I5041 Acute combined systolic (congestive) and diastolic (congestive) heart failure: Secondary | ICD-10-CM

## 2023-07-07 ENCOUNTER — Other Ambulatory Visit: Payer: Self-pay

## 2023-07-07 MED ORDER — SACUBITRIL-VALSARTAN 24-26 MG PO TABS: 1.0000 | ORAL_TABLET | Freq: Two times a day (BID) | ORAL | 1 refills | Status: DC

## 2023-07-19 ENCOUNTER — Ambulatory Visit (INDEPENDENT_AMBULATORY_CARE_PROVIDER_SITE_OTHER): Payer: 59

## 2023-07-19 DIAGNOSIS — I480 Paroxysmal atrial fibrillation: Secondary | ICD-10-CM

## 2023-07-19 LAB — CUP PACEART REMOTE DEVICE CHECK
Battery Remaining Longevity: 150 mo
Battery Remaining Percentage: 100 %
Brady Statistic RA Percent Paced: 0 %
Brady Statistic RV Percent Paced: 0 %
Date Time Interrogation Session: 20250205023100
HighPow Impedance: 81 Ohm
Implantable Lead Connection Status: 753985
Implantable Lead Connection Status: 753985
Implantable Lead Implant Date: 20240205
Implantable Lead Implant Date: 20240205
Implantable Lead Location: 753859
Implantable Lead Location: 753860
Implantable Lead Model: 673
Implantable Lead Model: 7841
Implantable Lead Serial Number: 1388070
Implantable Lead Serial Number: 203387
Implantable Pulse Generator Implant Date: 20240205
Lead Channel Impedance Value: 570 Ohm
Lead Channel Impedance Value: 853 Ohm
Lead Channel Pacing Threshold Amplitude: 0.5 V
Lead Channel Pacing Threshold Amplitude: 0.6 V
Lead Channel Pacing Threshold Pulse Width: 0.4 ms
Lead Channel Pacing Threshold Pulse Width: 0.4 ms
Lead Channel Setting Pacing Amplitude: 2.5 V
Lead Channel Setting Pacing Amplitude: 2.5 V
Lead Channel Setting Pacing Pulse Width: 0.4 ms
Lead Channel Setting Sensing Sensitivity: 0.5 mV
Pulse Gen Serial Number: 652005
Zone Setting Status: 755011

## 2023-07-22 ENCOUNTER — Encounter: Payer: Self-pay | Admitting: Cardiology

## 2023-08-01 ENCOUNTER — Telehealth: Payer: Self-pay | Admitting: Cardiovascular Disease

## 2023-08-01 ENCOUNTER — Other Ambulatory Visit: Payer: Self-pay | Admitting: Cardiology

## 2023-08-01 MED ORDER — APIXABAN 5 MG PO TABS: 5.0000 mg | ORAL_TABLET | Freq: Two times a day (BID) | ORAL | 1 refills | Status: DC

## 2023-08-01 NOTE — Telephone Encounter (Signed)
*  STAT* If patient is at the pharmacy, call can be transferred to refill team.   1. Which medications need to be refilled? (please list name of each medication and dose if known)   apixaban (ELIQUIS) 5 MG TABS tablet   2. Would you like to learn more about the convenience, safety, & potential cost savings by using the [facility]?   3. Are you open to using the [facility] (Type [facility]. ).  4. Which pharmacy/location (including street and city if local pharmacy) is medication to be sent to?  [facility] - [place] ([place] - [address]   5. Do they need a 30 day or 90 day supply?   90 day  Patient stated he has 2 days left of this medication.

## 2023-08-01 NOTE — Telephone Encounter (Signed)
Prescription refill request for Eliquis received. Indication: PAF Last office visit: 05/03/23  Jeanie Cooks MD Scr: 1.16 on 05/03/23  Epic Age: 53 Weight: 86.5kg  Based on above findings Eliquis 5mg  twice daily is the appropriate dose.  Refill approved.

## 2023-08-07 ENCOUNTER — Other Ambulatory Visit (HOSPITAL_COMMUNITY): Payer: Self-pay

## 2023-08-07 ENCOUNTER — Telehealth: Payer: Self-pay | Admitting: Cardiology

## 2023-08-07 MED ORDER — SACUBITRIL-VALSARTAN 24-26 MG PO TABS
1.0000 | ORAL_TABLET | Freq: Two times a day (BID) | ORAL | 1 refills | Status: DC
  Filled 2023-08-07: qty 60, 30d supply, fill #0

## 2023-08-07 NOTE — Telephone Encounter (Signed)
 This is a Pt of Dr. Lalla Brothers as an EP. He's listed as Dr. Royann Shivers as head Cardiologist. He hasn't ever seen Dr. Royann Shivers in person since he's been a pt of [facility]. Does Dr. Lalla Brothers want to refill? Please advise.

## 2023-08-07 NOTE — Telephone Encounter (Signed)
*  STAT* If patient is at the pharmacy, call can be transferred to refill team.   1. Which medications need to be refilled? (please list name of each medication and dose if known)   sacubitril-valsartan (ENTRESTO) 24-26 MG     2. Would you like to learn more about the convenience, safety, & potential cost savings by using the [facility]? No   3. Are you open to using the [facility] (Type [facility]. ) No   4. Which pharmacy/location (including street and city if local pharmacy) is medication to be sent to? [facility] - [place] ([place] - [address]    5. Do they need a 30 day or 90 day supply? 90 day   Pt not due for f/u until May

## 2023-08-11 ENCOUNTER — Telehealth: Payer: Self-pay | Admitting: Cardiovascular Disease

## 2023-08-11 MED ORDER — SACUBITRIL-VALSARTAN 24-26 MG PO TABS: 1.0000 | ORAL_TABLET | Freq: Two times a day (BID) | ORAL | 0 refills | Status: DC

## 2023-08-11 NOTE — Telephone Encounter (Signed)
*  STAT* If patient is at the pharmacy, call can be transferred to refill team.   1. Which medications need to be refilled? (please list name of each medication and dose if known)   sacubitril-valsartan (ENTRESTO) 24-26 MG    2. Which pharmacy/location (including street and city if local pharmacy) is medication to be sent to?  [facility] - [place] ([place] - [address]    3. Do they need a 30 day or 90 day supply? 90   Patient neeed a 90 day supple so that his insurance can cover the medication. Please advise

## 2023-08-25 NOTE — Progress Notes (Signed)
 Remote ICD transmission.

## 2023-10-18 ENCOUNTER — Ambulatory Visit (INDEPENDENT_AMBULATORY_CARE_PROVIDER_SITE_OTHER): Payer: 59

## 2023-10-18 DIAGNOSIS — I480 Paroxysmal atrial fibrillation: Secondary | ICD-10-CM

## 2023-10-18 LAB — CUP PACEART REMOTE DEVICE CHECK
Battery Remaining Longevity: 144 mo
Battery Remaining Percentage: 100 %
Brady Statistic RA Percent Paced: 0 %
Brady Statistic RV Percent Paced: 0 %
Date Time Interrogation Session: 20250507025700
HighPow Impedance: 89 Ohm
Implantable Lead Connection Status: 753985
Implantable Lead Connection Status: 753985
Implantable Lead Implant Date: 20240205
Implantable Lead Implant Date: 20240205
Implantable Lead Location: 753859
Implantable Lead Location: 753860
Implantable Lead Model: 673
Implantable Lead Model: 7841
Implantable Lead Serial Number: 1388070
Implantable Lead Serial Number: 203387
Implantable Pulse Generator Implant Date: 20240205
Lead Channel Impedance Value: 519 Ohm
Lead Channel Impedance Value: 615 Ohm
Lead Channel Pacing Threshold Amplitude: 0.5 V
Lead Channel Pacing Threshold Amplitude: 0.8 V
Lead Channel Pacing Threshold Pulse Width: 0.4 ms
Lead Channel Pacing Threshold Pulse Width: 0.4 ms
Lead Channel Setting Pacing Amplitude: 2.5 V
Lead Channel Setting Pacing Amplitude: 2.5 V
Lead Channel Setting Pacing Pulse Width: 0.4 ms
Lead Channel Setting Sensing Sensitivity: 0.5 mV
Pulse Gen Serial Number: 652005
Zone Setting Status: 755011

## 2023-10-19 ENCOUNTER — Encounter: Payer: Self-pay | Admitting: Cardiology

## 2023-10-27 ENCOUNTER — Telehealth: Payer: Self-pay | Admitting: Cardiovascular Disease

## 2023-10-27 MED ORDER — SACUBITRIL-VALSARTAN 24-26 MG PO TABS
1.0000 | ORAL_TABLET | Freq: Two times a day (BID) | ORAL | 1 refills | Status: DC
Start: 2023-10-27 — End: 2023-10-30

## 2023-10-27 NOTE — Telephone Encounter (Signed)
*  STAT* If patient is at the pharmacy, call can be transferred to refill team.   1. Which medications need to be refilled? (please list name of each medication and dose if known) sacubitril -valsartan  (ENTRESTO ) 24-26 MG    2. Would you like to learn more about the convenience, safety, & potential cost savings by using the [facility]?    3. Are you open to using the [facility] (Type [facility].  ).   4. Which pharmacy/location (including street and city if local pharmacy) is medication to be sent to? [facility] - [place] ([place]), [place] - [address]    5. Do they need a 30 day or 90 day supply? 90 day   Pt appt set for 12/25/2023

## 2023-10-27 NOTE — Telephone Encounter (Signed)
 Pt's medication was sent to pt's pharmacy as requested. Confirmation received.

## 2023-10-30 ENCOUNTER — Other Ambulatory Visit: Payer: Self-pay

## 2023-10-30 MED ORDER — SACUBITRIL-VALSARTAN 24-26 MG PO TABS: 1.0000 | ORAL_TABLET | Freq: Two times a day (BID) | ORAL | 0 refills | Status: DC

## 2023-10-30 NOTE — Telephone Encounter (Signed)
*  STAT* If patient is at the pharmacy, call can be transferred to refill team.   1. Which medications need to be refilled? (please list name of each medication and dose if known)   sacubitril -valsartan  (ENTRESTO ) 24-26 MG   2. Would you like to learn more about the convenience, safety, & potential cost savings by using the [facility]?   3. Are you open to using the [facility] (Type [facility]. ).  4. Which pharmacy/location (including street and city if local pharmacy) is medication to be sent to?  [facility] - [place] ([place]), [place] - [address]   5. Do they need a 30 day or 90 day supply?   90 day  Patient stated his insurance will only cover this medication if the prescription is written for 90 days.  Patient wants refill re-sent to [facility].  Patient has appointment scheduled on 7/14 with M. Lenze.  Patient stated he only has 1 tablet left.

## 2023-10-30 NOTE — Telephone Encounter (Signed)
 RX sent to requested Pharmacy

## 2023-11-13 ENCOUNTER — Other Ambulatory Visit: Payer: Self-pay

## 2023-11-13 ENCOUNTER — Other Ambulatory Visit: Payer: Self-pay | Admitting: Cardiology

## 2023-11-13 MED ORDER — SPIRONOLACTONE 25 MG PO TABS: 25.0000 mg | ORAL_TABLET | Freq: Every day | ORAL | 6 refills | Status: AC

## 2023-11-28 ENCOUNTER — Telehealth: Payer: Self-pay

## 2023-11-28 DIAGNOSIS — I482 Chronic atrial fibrillation, unspecified: Secondary | ICD-10-CM

## 2023-11-28 NOTE — Telephone Encounter (Addendum)
 Spoke with patient.  He states that he had food poisoning yesterday and was throwing up all morning around time of event.   He was feeling fine before and after getting sick. No complaints or concerns.   He is taking his carvedilol  and amiodarone  as prescribed but has missed some doses recently.  He will be sure to take daily.    Past due for follow up, he has been set up for follow up with Michaelle Adolphus, PA-C on 6/27 at 1220.  I offered appt times this week, but patient cannot come due to work demands.   He knows to call our office if any further shock events in interim, if severe symptoms to go to the ER.

## 2023-11-28 NOTE — Progress Notes (Signed)
 Remote ICD transmission.

## 2023-11-28 NOTE — Telephone Encounter (Signed)
 LM again for patient to call back. Unable to hold OV for this afternoon any longer.

## 2023-11-28 NOTE — Telephone Encounter (Signed)
 ALERT REMOTE TRANSMISSION:  Antitachycardia pacing (ATP) therapy delivered to convert arrhythmia. Ventricular shock therapy delivered to convert arrhythmia.  Event occurred 6/16 @ 09:31, HR 229, EGM c/w with atrial arrhythmia, A>V, followed by atrial driven tachycardia/AFL, ATP delivered x1 followed by 41J of HV therapy slowing rate.   LM for patient to call us  to assess sx's, meds and health.  We are currently holding a spot this afternoon with Creighton Doffing, NP. Patient is due for follow up as well.     Aaron Aas

## 2023-11-29 ENCOUNTER — Telehealth: Payer: Self-pay | Admitting: Cardiovascular Disease

## 2023-11-29 NOTE — Telephone Encounter (Signed)
*  STAT* If patient is at the pharmacy, call can be transferred to refill team.   1. Which medications need to be refilled? (please list name of each medication and dose if known)  amiodarone  (PACERONE ) 200 MG tablet  2. Which pharmacy/location (including street and city if local pharmacy) is medication to be sent to? [facility] - [place] ([place]), [place] - [address]    3. Do they need a 30 day or 90 day supply? 30

## 2023-11-29 NOTE — Telephone Encounter (Addendum)
 Spoke with patient, informed to increase amio to 200mg  daily per Dr. Renaye Carp PA-C orders.  Also requested patient have labs done at a [facility] prior to appt next week.  Patient unsure if he will be able to make it to get labs done prior.  Orders have been placed just in case.   He plans on keeping appt next week with [place] on 12/08/23.

## 2023-11-30 MED ORDER — AMIODARONE HCL 200 MG PO TABS: 200.0000 mg | ORAL_TABLET | Freq: Every day | ORAL | 1 refills | Status: AC

## 2023-11-30 NOTE — Telephone Encounter (Signed)
 RX sent to requested Pharmacy

## 2023-12-08 ENCOUNTER — Ambulatory Visit: Attending: Student | Admitting: Student

## 2023-12-08 NOTE — Progress Notes (Deleted)
  Electrophysiology Office Note:   ID:  Bryan Guerrero, DOB [birth year], MRN [id number]  Primary Cardiologist: Jerel Balding, MD Electrophysiologist: OLE ONEIDA HOLTS, MD  {Click to update primary MD,subspecialty MD or APP then REFRESH:1}    History of Present Illness:   Bryan Guerrero is a 53 y.o. male with h/o PAF, typical AFL, chronic systolic CHF,  seen today for {CPDPUUBEZ:71851}  Review of systems complete and found to be negative unless listed in HPI.   EP Information / Studies Reviewed:    EKG is ordered today. Personal review as below.       ICD Interrogation-  reviewed in detail today,  See PACEART report.  Arrhythmia/Device History BSX Dual Chamber ICD for VT/WCT ILR SJ -CL -EXPLANTED   Physical Exam:   VS:  There were no vitals taken for this visit.   Wt Readings from Last 3 Encounters:  05/03/23 190 lb 12.8 oz (86.5 kg)  11/10/22 181 lb (82.1 kg)  08/02/22 180 lb (81.6 kg)     GEN: No acute distress *** NECK: No JVD; No carotid bruits CARDIAC: {EPRHYTHM:28826}, no murmurs, rubs, gallops RESPIRATORY:  Clear to auscultation without rales, wheezing or rhonchi  ABDOMEN: Soft, non-tender, non-distended EXTREMITIES:  {EDEMA LEVEL:28147::No} edema; No deformity   ASSESSMENT AND PLAN:    Ventricular arrhythmia  s/p [employer] dual chamber ICD  euvolemic today Stable on an appropriate medical regimen Normal ICD function See Pace Art report No changes today  Disposition:   Follow up with {EPPROVIDERS:28135::EP Team} {EPFOLLOW UP:28173}   Signed, Ozell Prentice Passey, PA-C

## 2023-12-11 ENCOUNTER — Encounter: Payer: Self-pay | Admitting: Student

## 2023-12-19 NOTE — Progress Notes (Deleted)
  Cardiology Office Note:  .   Date:  12/19/2023  ID:  Bryan Guerrero, DOB [birth year], MRN [id number] PCP: Bryan Bascom RAMAN, NP  [facility] Providers Cardiologist:  Bryan Balding, MD Electrophysiologist:  Bryan ONEIDA HOLTS, MD  Advanced Heart Failure:  Bryan Shuck, MD { Click to update primary MD,subspecialty MD or APP then REFRESH:1}   History of Present Illness: .   Bryan Guerrero is a 53 y.o. male ***  ROS: ***  Studies Reviewed: Bryan Guerrero         Prior CV Studies: {Select studies to display:26339}  ***  Risk Assessment/Calculations:   {Does this patient have ATRIAL FIBRILLATION?:[phone]} No BP recorded.  {Refresh Note OR Click here to enter BP  :1}***       Physical Exam:   VS:  There were no vitals taken for this visit.   Orhtostatics: No data found. Wt Readings from Last 3 Encounters:  05/03/23 190 lb 12.8 oz (86.5 kg)  11/10/22 181 lb (82.1 kg)  08/02/22 180 lb (81.6 kg)    GEN: Well nourished, well developed in no acute distress NECK: No JVD; No carotid bruits CARDIAC: ***RRR, no murmurs, rubs, gallops RESPIRATORY:  Clear to auscultation without rales, wheezing or rhonchi  ABDOMEN: Soft, non-tender, non-distended EXTREMITIES:  No edema; No deformity   ASSESSMENT AND PLAN: .   ***     {Are you ordering a CV Procedure (e.g. stress test, cath, DCCV, TEE, etc)?   Press F2        :[id number]}  Dispo: ***  Signed, Bryan Pavy, PA-C

## 2023-12-25 ENCOUNTER — Ambulatory Visit: Admitting: Physician Assistant

## 2024-01-07 NOTE — Progress Notes (Unsigned)
 Cardiology Office Note:  .   Date:  01/07/2024  ID:  Bryan Guerrero, DOB 1970-11-13, MRN [id number] PCP: Oley Bascom RAMAN, NP  [facility] Providers Cardiologist:  Jerel Balding, MD Electrophysiologist:  OLE ONEIDA HOLTS, MD  Advanced Heart Failure:  Ezra Shuck, MD {  History of Present Illness: .   Bryan Guerrero is a 53 y.o. male w/PMHx of  HTN ETOH abuse AFlutter (s/p CTI ablation) Afib HFrEF Chronic CHF  Admitted Feb 2024 WCT c/w VT, symptomatic with dizziness (no syncope) ICD implanted (loop removed) Prior question of non-impaction vs ETOH induced, though ETOH less likely to cause scar on cMRI C.MRI this admission Basal septal midwall LGE, which is a scar pattern seen in nonischemic cardiomyopathies and associated with worse prognosis.  Following with Dr. Leita Last 05/03/23 w/Dr. HOLTS, amiodarone  dose further reduced with plans to stop in 38mo if rhythm remained quiet.  Device alert for therapies on 11/27/23 AFib > AFlutter to what appeared 1:1  ATP delivered >> HV therapy Pt reported being very sick, suspected food poisoning was vomiting all morning Dr. HOLTS recommended resume amiodarone  at 200mg  daily  No showed to EP visit June Canceled gen cards visit July  Today's visit is scheduled to f/u on device alert/therapies ROS:   He feels well Very clearly recalls the day/shock, was feeling awful, thinks he ate something bad at a picnic/BBQ and was very nauseous, retching/vomiting.  Had just vinished vomiting again when he got shocked, was not presyncope, did not have syncope.  Eventually did recover from his illness ~24hrs. Had been feeling well up to then and since.  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 2. RV insertion site LGE, which is a nonspecific scar pattern often seen in setting of elevated pulmonary pressures 3.  Mild LV dilatation with moderate systolic dysfunction (EF 31%) 4.  Normal RV size with moderate systolic dysfunction (EF 38%)     07/14/22: TTE 1. Left ventricular ejection fraction, by estimation, is 20 to 25%. The  left ventricle has severely decreased function. The left ventricle has no  regional wall motion abnormalities. The left ventricular internal cavity  size was moderately dilated. Left  ventricular diastolic parameters were normal.   2. Right ventricular systolic function is normal. The right ventricular  size is normal. Mildly increased right ventricular wall thickness. There  is normal pulmonary artery systolic pressure.   3. Left atrial size was moderately dilated.   4. Right atrial size was mildly dilated.   5. The mitral valve is normal in structure. Trivial mitral valve  regurgitation. No evidence of mitral stenosis.   6. The aortic valve is normal in structure. Aortic valve regurgitation is  not visualized. No aortic stenosis is  present.   7. The inferior vena cava is normal in size with greater than 50%  respiratory variability, suggesting right atrial pressure of 3 mmHg.      Risk Assessment/Calculations:    Physical Exam:   VS:  There were no vitals taken for this visit.   Wt Readings from Last 3  Encounters:  05/03/23 190 lb 12.8 oz (86.5 kg)  11/10/22 181 lb (82.1 kg)  08/02/22 180 lb (81.6 kg)    GEN: Well nourished, well developed in no acute distress NECK: No JVD; No carotid bruits CARDIAC: RRR, no murmurs, rubs, gallops RESPIRATORY:  CTA b/l without rales, wheezing or rhonchi  ABDOMEN: Soft, non-tender, non-distended EXTREMITIES: No edema; No deformity   ICD site: is stable, no thinning, fluctuation, tethering  ASSESSMENT AND PLAN: .    ICD paroxysmal AFib CHA2DS2Vasc is 2, on eliquis , appropriately dosed low % burden  VT Well controlled Chronic amiodarone  Labs today  Shock was inappropriate for rapid AFib> flutter in setting of acute GIl illness  NICM No symptoms or exam finding so volume OL Heart Logic is 8, looks about his baseline Needs to get back on track with Dr. Tyrell team  Secondary hypercoagulable state 2/2 AFib    Dispo: remotes as usual, in clinic with EP again in 42mo, sooner if needed.  Overdue to see Dr. Arletta team  Signed, Charlies Macario Arthur, PA-C

## 2024-01-10 ENCOUNTER — Ambulatory Visit: Attending: Cardiology | Admitting: Physician Assistant

## 2024-01-10 ENCOUNTER — Encounter: Payer: Self-pay | Admitting: Physician Assistant

## 2024-01-10 VITALS — BP 148/100 | HR 86 | Ht 72.0 in | Wt 189.7 lb

## 2024-01-10 DIAGNOSIS — D6869 Other thrombophilia: Secondary | ICD-10-CM

## 2024-01-10 DIAGNOSIS — I472 Ventricular tachycardia, unspecified: Secondary | ICD-10-CM | POA: Diagnosis not present

## 2024-01-10 DIAGNOSIS — I480 Paroxysmal atrial fibrillation: Secondary | ICD-10-CM | POA: Diagnosis not present

## 2024-01-10 DIAGNOSIS — Z95810 Presence of automatic (implantable) cardiac defibrillator: Secondary | ICD-10-CM | POA: Diagnosis not present

## 2024-01-10 DIAGNOSIS — I428 Other cardiomyopathies: Secondary | ICD-10-CM

## 2024-01-10 LAB — CBC

## 2024-01-10 NOTE — Patient Instructions (Addendum)
 Medication Instructions:   Your physician recommends that you continue on your current medications as directed. Please refer to the Current Medication list given to you today.   *If you need a refill on your cardiac medications before your next appointment, please call your pharmacy*   Lab Work:  PLEASE GO DOWN STAIRS  [facility]  FIRST FLOOR   ( GET OFF ELEVATORS WALK TOWARDS WAITING AREA LAB LOCATED BY PHARMACY): CMET  MAG  CBC AND TSH TODAY     If you have labs (blood work) drawn today and your tests are completely normal, you will receive your results only by: MyChart Message (if you have MyChart) OR A paper copy in the mail If you have any lab test that is abnormal or we need to change your treatment, we will call you to review the results.  Testing/Procedures: NONE ORDERED  TODAY    Follow-Up: At [facility], you and your health needs are our priority.  As part of our continuing mission to provide you with exceptional heart care, our providers are all part of one team.  This team includes your primary Cardiologist (physician) and Advanced Practice Providers or APPs (Physician Assistants and Nurse Practitioners) who all work together to provide you with the care you need, when you need it.    Your next appointment:   DR MCLEAN/APP  NEXT AVAILABLE    4 month(s) ( CONTACT  CASSIE HALL/ ANGELINE HAMMER FOR EP SCHEDULING ISSUES )   Provider:   You may see OLE ONEIDA HOLTS, MD or one of the following Advanced Practice Providers on your designated Care Team:   Charlies Arthur, [place]  We recommend signing up for the patient portal called MyChart.  Sign up information is provided on this After Visit Summary.  MyChart is used to connect with patients for Virtual Visits (Telemedicine).  Patients are able to view lab/test results, encounter notes, upcoming appointments, etc.  Non-urgent messages can be sent to your provider as well.   To learn more about what you can do with  MyChart, go to [email or web address].   Other Instructions

## 2024-01-11 ENCOUNTER — Ambulatory Visit: Payer: Self-pay | Admitting: Physician Assistant

## 2024-01-11 LAB — COMPREHENSIVE METABOLIC PANEL WITH GFR
ALT: 24 IU/L (ref 0–44)
AST: 42 IU/L — ABNORMAL HIGH (ref 0–40)
Albumin: 4.3 g/dL (ref 4.1–5.1)
Alkaline Phosphatase: 87 IU/L (ref 44–121)
BUN/Creatinine Ratio: 11 (ref 9–20)
BUN: 11 mg/dL (ref 6–24)
Bilirubin Total: 0.4 mg/dL (ref 0.0–1.2)
CO2: 21 mmol/L (ref 20–29)
Calcium: 9.4 mg/dL (ref 8.7–10.2)
Chloride: 102 mmol/L (ref 96–106)
Creatinine, Ser: 0.98 mg/dL (ref 0.76–1.27)
Globulin, Total: 2.5 g/dL (ref 1.5–4.5)
Glucose: 94 mg/dL (ref 70–99)
Potassium: 4 mmol/L (ref 3.5–5.2)
Sodium: 141 mmol/L (ref 134–144)
Total Protein: 6.8 g/dL (ref 6.0–8.5)
eGFR: 94 mL/min/1.73 (ref >59)

## 2024-01-11 LAB — CBC
Hematocrit: 39 (ref 37.5–51.0)
Hemoglobin: 12.8 g/dL — AB (ref 13.0–17.7)
MCH: 32.9 pg (ref 26.6–33.0)
MCHC: 32.8 g/dL (ref 31.5–35.7)
MCV: 100 fL — AB (ref 79–97)
Platelets: 172 x10E3/uL (ref 150–450)
RBC: 3.89 x10E6/uL — AB (ref 4.14–5.80)
RDW: 12.2 (ref 11.6–15.4)
WBC: 3 x10E3/uL — AB (ref 3.4–10.8)

## 2024-01-11 LAB — MAGNESIUM: Magnesium: 1.8 mg/dL (ref 1.6–2.3)

## 2024-01-11 LAB — TSH: TSH: 1.35 u[IU]/mL (ref 0.450–4.500)

## 2024-01-17 ENCOUNTER — Ambulatory Visit (INDEPENDENT_AMBULATORY_CARE_PROVIDER_SITE_OTHER): Payer: 59

## 2024-01-17 DIAGNOSIS — I480 Paroxysmal atrial fibrillation: Secondary | ICD-10-CM

## 2024-01-18 LAB — CUP PACEART REMOTE DEVICE CHECK
Battery Remaining Longevity: 132 mo
Battery Remaining Percentage: 100 %
Brady Statistic RA Percent Paced: 0 %
Brady Statistic RV Percent Paced: 0 %
Date Time Interrogation Session: 20250806023100
HighPow Impedance: 83 Ohm
Implantable Lead Connection Status: 753985
Implantable Lead Connection Status: 753985
Implantable Lead Implant Date: 20240205
Implantable Lead Implant Date: 20240205
Implantable Lead Location: 753859
Implantable Lead Location: 753860
Implantable Lead Model: 673
Implantable Lead Model: 7841
Implantable Lead Serial Number: 1388070
Implantable Lead Serial Number: 203387
Implantable Pulse Generator Implant Date: 20240205
Lead Channel Impedance Value: 616 Ohm
Lead Channel Impedance Value: 617 Ohm
Lead Channel Pacing Threshold Amplitude: 0.5 V
Lead Channel Pacing Threshold Amplitude: 0.6 V
Lead Channel Pacing Threshold Pulse Width: 0.4 ms
Lead Channel Pacing Threshold Pulse Width: 0.4 ms
Lead Channel Setting Pacing Amplitude: 2.5 V
Lead Channel Setting Pacing Amplitude: 2.5 V
Lead Channel Setting Pacing Pulse Width: 0.4 ms
Lead Channel Setting Sensing Sensitivity: 0.5 mV
Pulse Gen Serial Number: 652005
Zone Setting Status: 755011

## 2024-01-19 ENCOUNTER — Ambulatory Visit: Payer: Self-pay | Admitting: Cardiology

## 2024-03-08 ENCOUNTER — Encounter: Payer: Self-pay | Admitting: Physician Assistant

## 2024-03-08 ENCOUNTER — Telehealth: Payer: Self-pay | Admitting: Cardiology

## 2024-03-08 MED ORDER — CARVEDILOL 3.125 MG PO TABS: 3.1250 mg | ORAL_TABLET | Freq: Two times a day (BID) | ORAL | 2 refills | Status: DC

## 2024-03-08 NOTE — Telephone Encounter (Signed)
 Error

## 2024-03-08 NOTE — Telephone Encounter (Signed)
 Pt's medication was sent to pt's pharmacy as requested. Confirmation received.

## 2024-03-08 NOTE — Telephone Encounter (Signed)
*  STAT* If patient is at the pharmacy, call can be transferred to refill team.   1. Which medications need to be refilled? (please list name of each medication and dose if known) carvedilol  (COREG ) 3.125 MG tablet   2. Which pharmacy/location (including street and city if local pharmacy) is medication to be sent to? [facility] - [place] ([place]), [place] - [address]    3. Do they need a 30 day or 90 day supply?  90 day supply

## 2024-03-11 NOTE — Progress Notes (Signed)
 Remote ICD Transmission

## 2024-04-02 ENCOUNTER — Other Ambulatory Visit: Payer: Self-pay | Admitting: Cardiovascular Disease

## 2024-04-02 ENCOUNTER — Telehealth: Payer: Self-pay | Admitting: Cardiovascular Disease

## 2024-04-02 MED ORDER — SACUBITRIL-VALSARTAN 24-26 MG PO TABS: 1.0000 | ORAL_TABLET | Freq: Two times a day (BID) | ORAL | 0 refills | Status: DC

## 2024-04-02 MED ORDER — APIXABAN 5 MG PO TABS: 5.0000 mg | ORAL_TABLET | Freq: Two times a day (BID) | ORAL | 1 refills | Status: AC

## 2024-04-02 NOTE — Telephone Encounter (Signed)
 Pt c/o medication issue:  1. Name of Medication: ) sacubitril -valsartan  (ENTRESTO ) 24-26 MG   2. How are you currently taking this medication (dosage and times per day)? As written   3. Are you having a reaction (difficulty breathing--STAT)? No   4. What is your medication issue? Pt called in stating he he received 30 tablets for this med but he needs 90. He states he has never seen Dr. Francyne before and asked if he needs to or if Dr. Cindie can manage

## 2024-04-02 NOTE — Telephone Encounter (Signed)
 Prescription refill request for Eliquis  received. Indication:afib Last office visit:7/25 Scr:0.98  7/25 Age: 53 Weight:86  kg  Prescription refilled

## 2024-04-02 NOTE — Telephone Encounter (Signed)
 I saw him ONCE in the hospital in 2020. He was being followed in the CHF clinic, although was last there in 2023 and in the EP clinic, where he was seen more recently. He needs f/u in the HF clinic and refills should be directed to them.

## 2024-04-02 NOTE — Telephone Encounter (Signed)
*  STAT* If patient is at the pharmacy, call can be transferred to refill team.   1. Which medications need to be refilled? (please list name of each medication and dose if known) sacubitril -valsartan  (ENTRESTO ) 24-26 MG  apixaban  (ELIQUIS ) 5 MG TABS tablet    2. Would you like to learn more about the convenience, safety, & potential cost savings by using the [facility]?    3. Are you open to using the [facility] (Type [facility].  ).   4. Which pharmacy/location (including street and city if local pharmacy) is medication to be sent to?  [facility] - [place] ([place]), [place] - [address]     5. Do they need a 30 day or 90 day supply? 90 day

## 2024-04-02 NOTE — Telephone Encounter (Signed)
 Spoke with pt regarding a refill. Pt was told he needed an appointment with Dr. Francyne to receive a 90 day refill of Entresto  24-26 mg. Pt was made an appointment to see Lum Louis on Nov 5th. Pt was told to keep this appointment and that a 90 day refill would be requested from Dr. Francyne. Pt verbalized understanding. All questions if any were answered.

## 2024-04-02 NOTE — Telephone Encounter (Signed)
 RX sent in

## 2024-04-03 ENCOUNTER — Telehealth (HOSPITAL_COMMUNITY): Payer: Self-pay | Admitting: Cardiology

## 2024-04-03 NOTE — Telephone Encounter (Signed)
 Called to confirm/remind patient of their appointment at the [facility] on 04/03/24.   Appointment:   [x] Confirmed  [] Left mess   [] No answer/No voice mail  [] VM Full/unable to leave message  [] Phone not in service  Patient reminded to bring all medications and/or complete list.  Confirmed patient has transportation. Gave directions, instructed to utilize valet parking.

## 2024-04-03 NOTE — Telephone Encounter (Signed)
 Can someone schedule this patient for an appointment?

## 2024-04-04 ENCOUNTER — Ambulatory Visit (HOSPITAL_COMMUNITY)
Admission: RE | Admit: 2024-04-04 | Discharge: 2024-04-04 | Disposition: A | Discharge status: Home / Self Care | Source: Ambulatory Visit | Attending: Cardiology | Admitting: Cardiology

## 2024-04-04 ENCOUNTER — Ambulatory Visit (HOSPITAL_COMMUNITY): Payer: Self-pay | Admitting: Cardiology

## 2024-04-04 VITALS — BP 178/120 | HR 73 | Wt 191.6 lb

## 2024-04-04 DIAGNOSIS — I5022 Chronic systolic (congestive) heart failure: Secondary | ICD-10-CM | POA: Diagnosis present

## 2024-04-04 DIAGNOSIS — I428 Other cardiomyopathies: Secondary | ICD-10-CM | POA: Diagnosis not present

## 2024-04-04 DIAGNOSIS — Z7901 Long term (current) use of anticoagulants: Secondary | ICD-10-CM | POA: Insufficient documentation

## 2024-04-04 DIAGNOSIS — Z95810 Presence of automatic (implantable) cardiac defibrillator: Secondary | ICD-10-CM | POA: Diagnosis not present

## 2024-04-04 DIAGNOSIS — N183 Chronic kidney disease, stage 3 unspecified: Secondary | ICD-10-CM | POA: Diagnosis not present

## 2024-04-04 DIAGNOSIS — I4892 Unspecified atrial flutter: Secondary | ICD-10-CM | POA: Insufficient documentation

## 2024-04-04 DIAGNOSIS — Z79899 Other long term (current) drug therapy: Secondary | ICD-10-CM | POA: Diagnosis not present

## 2024-04-04 DIAGNOSIS — F1010 Alcohol abuse, uncomplicated: Secondary | ICD-10-CM | POA: Insufficient documentation

## 2024-04-04 DIAGNOSIS — F17290 Nicotine dependence, other tobacco product, uncomplicated: Secondary | ICD-10-CM | POA: Diagnosis not present

## 2024-04-04 DIAGNOSIS — I130 Hypertensive heart and chronic kidney disease with heart failure and stage 1 through stage 4 chronic kidney disease, or unspecified chronic kidney disease: Secondary | ICD-10-CM | POA: Diagnosis not present

## 2024-04-04 DIAGNOSIS — I4891 Unspecified atrial fibrillation: Secondary | ICD-10-CM | POA: Diagnosis not present

## 2024-04-04 LAB — CBC
HCT: 35.5 % — ABNORMAL LOW (ref 39.0–52.0)
Hemoglobin: 11.7 g/dL — ABNORMAL LOW (ref 13.0–17.0)
MCH: 33 pg (ref 26.0–34.0)
MCHC: 33 g/dL (ref 30.0–36.0)
MCV: 100 fL (ref 80.0–100.0)
Platelets: 133 K/uL — ABNORMAL LOW (ref 150–400)
RBC: 3.55 MIL/uL — ABNORMAL LOW (ref 4.22–5.81)
RDW: 13 % (ref 11.5–15.5)
WBC: 3.7 K/uL — ABNORMAL LOW (ref 4.0–10.5)
nRBC: 0 % (ref 0.0–0.2)

## 2024-04-04 LAB — COMPREHENSIVE METABOLIC PANEL WITH GFR
ALT: 36 U/L (ref 0–44)
AST: 68 U/L — ABNORMAL HIGH (ref 15–41)
Albumin: 3.7 g/dL (ref 3.5–5.0)
Alkaline Phosphatase: 59 U/L (ref 38–126)
Anion gap: 12 (ref 5–15)
BUN: 7 mg/dL (ref 6–20)
CO2: 25 mmol/L (ref 22–32)
Calcium: 9.1 mg/dL (ref 8.9–10.3)
Chloride: 102 mmol/L (ref 98–111)
Creatinine, Ser: 1 mg/dL (ref 0.61–1.24)
GFR, Estimated: >60 mL/min (ref >60)
Glucose, Bld: 82 mg/dL (ref 70–99)
Potassium: 3.5 mmol/L (ref 3.5–5.1)
Sodium: 139 mmol/L (ref 135–145)
Total Bilirubin: 0.8 mg/dL (ref 0.0–1.2)
Total Protein: 6.9 g/dL (ref 6.5–8.1)

## 2024-04-04 LAB — TSH: TSH: 1.406 u[IU]/mL (ref 0.350–4.500)

## 2024-04-04 LAB — BRAIN NATRIURETIC PEPTIDE: B Natriuretic Peptide: 80.8 pg/mL (ref 0.0–100.0)

## 2024-04-04 MED ORDER — CARVEDILOL 6.25 MG PO TABS: 6.2500 mg | ORAL_TABLET | Freq: Two times a day (BID) | ORAL | 3 refills | Status: DC

## 2024-04-04 MED ORDER — SACUBITRIL-VALSARTAN 49-51 MG PO TABS: 1.0000 | ORAL_TABLET | Freq: Two times a day (BID) | ORAL | 3 refills | Status: DC

## 2024-04-04 NOTE — Patient Instructions (Signed)
 Medication Changes:  INCREASE Entresto  to 49/51 mg Twice daily   INCREASE Carvedilol  to 6.25 mg Twice daily   Lab Work:  Labs done today, your results will be available in MyChart, we will contact you for abnormal readings.  Your physician recommends that you return for lab work in: 1-2 weeks  Testing/Procedures:  Your physician has requested that you have an echocardiogram. Echocardiography is a painless test that uses sound waves to create images of your heart. It provides your doctor with information about the size and shape of your heart and how well your heart's chambers and valves are working. This procedure takes approximately one hour. There are no restrictions for this procedure. Please do NOT wear cologne, perfume, aftershave, or lotions (deodorant is allowed). Please arrive 15 minutes prior to your appointment time.  Please note: We ask at that you not bring children with you during ultrasound (echo/ vascular) testing. Due to room size and safety concerns, children are not allowed in the ultrasound rooms during exams. Our front office staff cannot provide observation of children in our lobby area while testing is being conducted. An adult accompanying a patient to their appointment will only be allowed in the ultrasound room at the discretion of the ultrasound technician under special circumstances. We apologize for any inconvenience.   Special Instructions // Education:  Do the following things EVERYDAY: Weigh yourself in the morning before breakfast. Write it down and keep it in a log. Take your medicines as prescribed Eat low salt foods--Limit salt (sodium) to 2000 mg per day.  Stay as active as you can everyday Limit all fluids for the day to less than 2 liters   Follow-Up in:   Please follow up with our heart failure pharmacist in 3 weeks  Your physician recommends that you schedule a follow-up appointment in: 8 weeks   At the [facility], you  and your health needs are our priority. We have a designated team specialized in the treatment of Heart Failure. This Care Team includes your primary Heart Failure Specialized Cardiologist (physician), Advanced Practice Providers (APPs- Physician Assistants and Nurse Practitioners), and Pharmacist who all work together to provide you with the care you need, when you need it.   You may see any of the following providers on your designated Care Team at your next follow up:  Dr. Toribio Fuel Dr. Ezra Shuck Dr. Ria Commander Dr. Odis Brownie Greig Mosses, NP Caffie Shed, [place] [facility] [place], [place] Beckey Coe, NP [place] Lee, NP Tinnie Redman, PharmD   Please be sure to bring in all your medications bottles to every appointment.   Need to Contact Us :  If you have any questions or concerns before your next appointment please send us  a message through [place] or call our office at [phone].    TO LEAVE A MESSAGE FOR THE NURSE SELECT OPTION 2, PLEASE LEAVE A MESSAGE INCLUDING: YOUR NAME DATE OF BIRTH CALL BACK NUMBER REASON FOR CALL**this is important as we prioritize the call backs  YOU WILL RECEIVE A CALL BACK THE SAME DAY AS LONG AS YOU CALL BEFORE 4:00 PM

## 2024-04-07 NOTE — Progress Notes (Signed)
 Advanced Heart Failure Clinic Note    PCP: Oley Bascom RAMAN, NP HF Cardiology: Dr. Rolan  Reason for Visit: Heart Failure   HPI: Bryan Guerrero is a 53 y.o. with history of chronic systolic heart failure dating back to 2020, ETOH abuse, HTN, and tobacco abuse.     Admitted back in February 2020 with increased shortness of breath. At that time he was drinking 1/5 of licquor per day. ECHO completed and showed severely reduced EF 15-20%. 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 No formal cath. He was discharged 08/08/19. He returned for one appointment with cardiology but no return f/u after that.   Admitted 10/21 for acute on chronic systolic heart failure + atrial flutter w/ RVR requiring DCCV. Also w/ low output requiring milrinone . R/LHC showed mildly elevated PCWP, normal RA pressure, normal cardiac output on milrinone  and no significant coronary disease. Echo showed severe LVEF <20%, severe RV dysfunction and severe Bryan. He was diuresed w/ IV Lasix . Was able to wean off milrinone . Discharge wt was 186 lb.   Echo in 1/22 showed EF 25% with global hypokinesis, normal RV, trivial Bryan.    Follow up 10/22, stable NYHA I and not volume overloaded. GDMT increased with elevated BP.   Admitted 4/23 with AF with RVR. Started on amio gtt. Underwent DCCV to NSR. Echo showed EF 20-25% prominent apical trabeculations. 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 Mild Bryan. RV mildly HK. Amio changed to oral. Discharged home, weight 183 lbs.   Saw EP 09/22/21, s/p atrial flutter ablation 11/2021, apixaban  was stopped.  Echo in 7/23 showed EF improved at 50%, normal RV  In 12/23, he was noted to have atrial fibrillation and apixaban  was restarted.   In 2/24, he had VT episode.  Echo showed EF 20-25%, normal RV.  Cardiac MRI showed LV EF 31%, basal septal mid-wall LGE, RV insertion site LGE, RV EF 38%.  [employer] ICD was placed.   Patient says he is doing well today.  No significant exertional dyspnea or chest pain.  No  orthopnea/PND.  No lightheadedness, no palpitations.  He is in NSR today.  He has been out of Entresto  for a few days.  Not drinking any ETOH.   ECG (personally reviewed): NSR with PAC  Labs (7/25): TSH normal, hgb 12.8, K 4, creatinine 0.98, LFTs normal  [employer] device interrogation: HeartLogic 7  PMH: 1. HTN 2. ETOH abuse 3. Chronic systolic CHF: Nonischemic cardiomyopathy.  [employer] ICD.  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  - LHC/RHC (10/21): No significant CAD; mean RA 4, PA 34/8, mean PCWP 18, CI 3.01.  - Echo (1/22): EF 25% with global hypokinesis, normal RV, trivial Bryan. - Echo (4/23): EF 20-25% prominent apical trabeculations. 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 Mild Bryan. RV mildly HK  - Echo (7/23) EF 50%, normal RV - Echo (2/24): EF 20-25%, normal RV - Cardiac MRI (2/24): LV EF 31%, basal septal mid-wall LGE, RV insertion site LGE, RV EF 38%.  4. Atrial flutter/fibrillation: DCCV 10/21. - DCCV 4/23 to NSR (on amiodarone ) - s/p atrial flutter ablation 6/23. - Atrial fibrillation noted in 12/23.  5. CKD stage 3 6. VT: 2/24, [employer] ICD placed.   Review of systems complete and found to be negative unless listed in HPI.    Current Outpatient Medications  Medication Sig Dispense Refill   acetaminophen  (TYLENOL ) 500  MG tablet Take 1,000 mg by mouth every 6 (six) hours as needed for mild pain.     amiodarone  (PACERONE ) 200 MG tablet Take 1 tablet (200 mg total) by mouth daily. 90 tablet 1   apixaban  (ELIQUIS ) 5 MG TABS tablet Take 1 tablet (5 mg total) by mouth 2 (two) times daily. 180 tablet 1   FARXIGA  10 MG TABS tablet Take 1 tablet by mouth once daily 90 tablet 3   Multiple Vitamin (MULTIVITAMIN WITH MINERALS) TABS tablet Take 1 tablet by mouth daily.     sacubitril -valsartan  (ENTRESTO ) 49-51 MG  Take 1 tablet by mouth 2 (two) times daily. 60 tablet 3   spironolactone  (ALDACTONE ) 25 MG tablet Take 1 tablet (25 mg total) by mouth daily. 30 tablet 6   carvedilol  (COREG ) 6.25 MG tablet Take 1 tablet (6.25 mg total) by mouth 2 (two) times daily. 60 tablet 3   furosemide  (LASIX ) 20 MG tablet Take 1 tablet (20 mg total) by mouth daily as needed. For > 3 lb weight gain in 24 hr or > 5 lb weight gain in 1 week (Patient not taking: Reported on 04/04/2024) 30 tablet 3   No current facility-administered medications for this encounter.    No Known Allergies    Social History   Socioeconomic History   Marital status: Widowed    Spouse name: Not on file   Number of children: Not on file   Years of education: Not on file   Highest education level: Not on file  Occupational History   Not on file  Tobacco Use   Smoking status: Some Days    Types: Cigars    Last attempt to quit: 07/14/2018    Years since quitting: 5.7   Smokeless tobacco: Never  Vaping Use   Vaping status: Never Used  Substance and Sexual Activity   Alcohol use: Not Currently    Comment: quit 03/09/20   Drug use: Not Currently    Types: Marijuana    Comment: quit 03/09/20   Sexual activity: Not Currently  Other Topics Concern   Not on file  Social History Narrative   Not on file   Social Drivers of Health   Financial Resource Strain: Low Risk  (04/11/2022)   Received from [facility]   Overall Financial Resource Strain (CARDIA)    Difficulty of Paying Living Expenses: Not hard at all  Food Insecurity: No Food Insecurity (07/14/2022)   Hunger Vital Sign    Worried About Running Out of Food in the Last Year: Never true    Ran Out of Food in the Last Year: Never true  Transportation Needs: No Transportation Needs (07/14/2022)   PRAPARE - Administrator, Civil Service (Medical): No    Lack of Transportation (Non-Medical): No  Physical Activity: Not on file  Stress: No Stress Concern Present  (04/11/2022)   Received from [facility] - Occupational Stress Questionnaire    Feeling of Stress : Not at all  Social Connections: Unknown (04/11/2022)   Received from [facility]   Social Network    Social Network: Not on file  Intimate Partner Violence: Not At Risk (07/14/2022)   Humiliation, Afraid, Rape, and Kick questionnaire    Fear of Current or Ex-Partner: No    Emotionally Abused: No    Physically Abused: No    Sexually Abused: No   Family History  Problem Relation Age of Onset   Hypertension Father  Cancer Neg Hx    Diabetes Neg Hx    BP (!) 178/120   Pulse 73   Wt 86.9 kg (191 lb 9.6 oz)   SpO2 96%   BMI 25.99 kg/m   Wt Readings from Last 3 Encounters:  04/04/24 86.9 kg (191 lb 9.6 oz)  01/10/24 86 kg (189 lb 11.2 oz)  05/03/23 86.5 kg (190 lb 12.8 oz)   PHYSICAL EXAM: General: NAD Neck: No JVD, no thyromegaly or thyroid  nodule.  Lungs: Clear to auscultation bilaterally with normal respiratory effort. CV: Nondisplaced PMI.  Heart regular S1/S2, no S3/S4, no murmur.  No peripheral edema.  No carotid bruit.  Normal pedal pulses.  Abdomen: Soft, nontender, no hepatosplenomegaly, no distention.  Skin: Intact without lesions or rashes.  Neurologic: Alert and oriented x 3.  Psych: Normal affect. Extremities: No clubbing or cyanosis.  HEENT: Normal.   ASSESSMENT & PLAN: 1. Chronic Systolic CHF: Nonischemic cardiomyopathy, [employer] ICD. Admission 10/21 for acute systolic CHF w/ low output requiring milrinone . Biventricular failure, echo 10/21 EF < 20%, severe RV dysfunction, severe Bryan. LHC showed no significant coronary disease. CO good on milrinone , was able to wean off. 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  Echo  1/22 with EF 25%, normal RV, trivial Bryan. Patient has history of heavy ETOH => he quit, but started back again. Cause of cardiomyopathy likely  noncompaction versus ETOH. Echo 4/23 EF 20-25% prominent apical trabeculations. 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 Echo in 7/23 showed EF improved 50%, normal RV.  However, he had VT in 2/24 and echo at that time showed EF back down to 20-25%.  Cardiac MRI in 2/24 showed LV EF 31%, basal septal mid-wall LGE, RV insertion site LGE, RV EF 38%. NYHA class II today, not volume overloaded by exam or HeartLogic. He is staying off ETOH.  - Increase Coreg  to 6.25 mg bid.    - Continue spironolactone  25 mg daily.   - Increase Entresto  to 49/51 bid.  BMET/BNP today, BMET in 10 days.  - Continue Farxiga  10 mg daily.  - I will arrange for repeat echo.  2. Atrial flutter/fibrillation: s/p DCCV 10/21.  Readmitted with AF RVR. Started on amiodarone  and underwent DCCV (4/23) to NSR. S/p atrial flutter ablation 6/23.  Was noted in 12/23 to have atrial fibrillation.  He is in NSR today. - Continue amiodarone  200 mg daily. Check TSH and CMET today. - Given age, need to consider atrial fibrillation ablation. Discuss next appt.  - Continue Eliquis  5 mg bid. 3. ETOH abuse: Says he is no longer drinking.  Follow up in 3 wks with HF pharmacist for med titration, see APP in 6 wks.   I spent 31 minutes reviewing records, interviewing/examining patient, and managing orders.   Bryan Guerrero 04/07/2024

## 2024-04-15 ENCOUNTER — Ambulatory Visit (HOSPITAL_COMMUNITY)
Admission: RE | Admit: 2024-04-15 | Discharge: 2024-04-15 | Disposition: A | Discharge status: Home / Self Care | Source: Ambulatory Visit | Attending: Cardiology | Admitting: Cardiology

## 2024-04-15 ENCOUNTER — Encounter: Payer: Self-pay | Admitting: *Deleted

## 2024-04-15 DIAGNOSIS — I5022 Chronic systolic (congestive) heart failure: Secondary | ICD-10-CM | POA: Diagnosis present

## 2024-04-15 LAB — BASIC METABOLIC PANEL WITH GFR
Anion gap: 13 (ref 5–15)
BUN: 5 mg/dL — ABNORMAL LOW (ref 6–20)
CO2: 23 mmol/L (ref 22–32)
Calcium: 8.6 mg/dL — ABNORMAL LOW (ref 8.9–10.3)
Chloride: 104 mmol/L (ref 98–111)
Creatinine, Ser: 0.97 mg/dL (ref 0.61–1.24)
GFR, Estimated: >60 mL/min (ref >60)
Glucose, Bld: 193 mg/dL — ABNORMAL HIGH (ref 70–99)
Potassium: 3 mmol/L — ABNORMAL LOW (ref 3.5–5.1)
Sodium: 140 mmol/L (ref 135–145)

## 2024-04-17 ENCOUNTER — Ambulatory Visit (INDEPENDENT_AMBULATORY_CARE_PROVIDER_SITE_OTHER): Payer: 59

## 2024-04-17 ENCOUNTER — Ambulatory Visit: Admitting: Emergency Medicine

## 2024-04-17 DIAGNOSIS — I480 Paroxysmal atrial fibrillation: Secondary | ICD-10-CM | POA: Diagnosis not present

## 2024-04-17 MED ORDER — POTASSIUM CHLORIDE CRYS ER 20 MEQ PO TBCR
20.0000 meq | EXTENDED_RELEASE_TABLET | Freq: Every day | ORAL | 3 refills | Status: AC
Start: 2024-04-17 — End: ?

## 2024-04-17 NOTE — Addendum Note (Signed)
 Addended by: JERONA DALTON HERO on: 04/17/2024 04:05 PM   Modules accepted: Orders

## 2024-04-17 NOTE — Progress Notes (Deleted)
 Cardiology Office Note:    Date:  04/17/2024  ID:  [name], DOB 1971-02-07, MRN [id number] PCP: Oley Bascom RAMAN, NP  [facility] Providers Cardiologist:  Jerel Balding, MD Electrophysiologist:  OLE ONEIDA HOLTS, MD  Advanced Heart Failure:  Ezra Shuck, MD { Click to update primary MD,subspecialty MD or APP then REFRESH:1}    {Click to Open Review  :1}   Patient Profile:       Chief Complaint: *** History of Present Illness:  SANTEZ WOODCOX is a 53 y.o. male with visit-pertinent history of chronic systolic heart failure, alcohol abuse, hypertension, tobacco abuse  He was admitted in February 2020 with increased shortness of breath.  At that time he was drinking 1/5 of liquor per day.  Echocardiogram completed and showed severely reduced EF of 15 to 20%.  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  No formal cath.  He was discharged in stable condition.  He returned from 1 appointment with cardiology but not returned for follow-up after that.  He was admitted 10/21 for acute on chronic systolic heart failure and atrial flutter with RVR requiring DCCV.  Also with low output requiring milrinone .  R/LHC showed mildly elevated PCWP, normal RA pressure, normal cardiac output on milrinone  and no significant coronary disease.  Echocardiogram showed severe LVEF less than 20%, severe RV dysfunction and severe MR.  He was diuresed with IV Lasix .  He was able to wean off milrinone .  Echocardiogram 06/2020 showed LVEF 25% with global hypokinesis, normal RV, and trivial MR.  He was admitted 09/2021 with A-fib with RVR.  Started on amiodarone  drip.  Underwent DCCV to NSR.  Echocardiogram showed LVEF 2025% prominent apical trabeculations.  Suspect LV [place].  Mild MR, RV mildly HK.  Amiodarone  changed to oral.  Discharged home in stable condition.  Established with EP on 09/2021.  Underwent atrial flutter ablation on 11/2021 and a Baxan was stopped.  Echocardiogram on 12/2021 showed LVEF  improved to 50%, normal RV.  In 05/2022 he was noted to have atrial fibrillation and apixaban  was restarted.  In 07/2022 he had an episode of VT.  Echocardiogram showed LVEF 2025%, normal RV.  Cardiac MRI showed LVEF 31%, basal septal mid wall LGE, RV insertion site LGE, RVEF 38%.  [employer] ICD was placed.  He was last seen by heart failure clinic on 04/04/2024.  He was without significant dyspnea or chest pains.  No orthopnea or PND.  He was in normal sinus rhythm.  He was not drinking any EtOH.  His carvedilol  was increased to 6.25 mg twice daily.  He is continuing spironolactone  25 mg daily.  His Entresto  was increased to 49-51 mg twice daily.  He was scheduled for repeat echocardiogram on 06/04/2024.  Discussed the use of AI scribe software for clinical note transcription with the patient, who gave verbal consent to proceed.  History of Present Illness     Review of systems:  Please see the history of present illness. All other systems are reviewed and otherwise negative. ***      Studies Reviewed:        ***  Risk Assessment/Calculations:   {Does this patient have ATRIAL FIBRILLATION?:[id number]} No BP recorded.  {Refresh Note OR Click here to enter BP  :1}***        Physical Exam:   VS:  There were no vitals taken for this visit.   Wt Readings from Last 3 Encounters:  04/04/24 191 lb 9.6 oz (86.9 kg)  01/10/24 189 lb  11.2 oz (86 kg)  05/03/23 190 lb 12.8 oz (86.5 kg)    GEN: Well nourished, well developed in no acute distress NECK: No JVD; No carotid bruits CARDIAC: ***RRR, no murmurs, rubs, gallops RESPIRATORY:  Clear to auscultation without rales, wheezing or rhonchi  ABDOMEN: Soft, non-tender, non-distended EXTREMITIES:  No edema; No acute deformity ***      Assessment and Plan:    Assessment and Plan Assessment & Plan      {Are you ordering a CV Procedure (e.g. stress test, cath, DCCV, TEE, etc)?   Press F2        :[id number]}  Dispo:  No  follow-ups on file.  Signed, Lum LITTIE Louis, NP

## 2024-04-18 LAB — CUP PACEART REMOTE DEVICE CHECK
Battery Remaining Longevity: 144 mo
Battery Remaining Percentage: 100 %
Brady Statistic RA Percent Paced: 0 %
Brady Statistic RV Percent Paced: 0 %
Date Time Interrogation Session: 20251105023100
HighPow Impedance: 73 Ohm
Implantable Lead Connection Status: 753985
Implantable Lead Connection Status: 753985
Implantable Lead Implant Date: 20240205
Implantable Lead Implant Date: 20240205
Implantable Lead Location: 753859
Implantable Lead Location: 753860
Implantable Lead Model: 673
Implantable Lead Model: 7841
Implantable Lead Serial Number: 1388070
Implantable Lead Serial Number: 203387
Implantable Pulse Generator Implant Date: 20240205
Lead Channel Impedance Value: 581 Ohm
Lead Channel Impedance Value: 600 Ohm
Lead Channel Pacing Threshold Amplitude: 0.5 V
Lead Channel Pacing Threshold Amplitude: 0.6 V
Lead Channel Pacing Threshold Pulse Width: 0.4 ms
Lead Channel Pacing Threshold Pulse Width: 0.4 ms
Lead Channel Setting Pacing Amplitude: 2.5 V
Lead Channel Setting Pacing Amplitude: 2.5 V
Lead Channel Setting Pacing Pulse Width: 0.4 ms
Lead Channel Setting Sensing Sensitivity: 0.5 mV
Pulse Gen Serial Number: 652005
Zone Setting Status: 755011

## 2024-04-18 NOTE — Progress Notes (Signed)
 Remote ICD Transmission

## 2024-04-22 ENCOUNTER — Ambulatory Visit: Payer: Self-pay | Admitting: Cardiology

## 2024-04-25 ENCOUNTER — Ambulatory Visit (HOSPITAL_COMMUNITY): Payer: Self-pay | Admitting: Cardiology

## 2024-04-25 ENCOUNTER — Ambulatory Visit (HOSPITAL_COMMUNITY)
Admission: RE | Admit: 2024-04-25 | Discharge: 2024-04-25 | Disposition: A | Discharge status: Home / Self Care | Source: Ambulatory Visit | Attending: Internal Medicine | Admitting: Internal Medicine

## 2024-04-25 VITALS — BP 178/108 | HR 85 | Wt 192.8 lb

## 2024-04-25 DIAGNOSIS — I110 Hypertensive heart disease with heart failure: Secondary | ICD-10-CM | POA: Diagnosis not present

## 2024-04-25 DIAGNOSIS — Z79899 Other long term (current) drug therapy: Secondary | ICD-10-CM | POA: Insufficient documentation

## 2024-04-25 DIAGNOSIS — Z720 Tobacco use: Secondary | ICD-10-CM | POA: Insufficient documentation

## 2024-04-25 DIAGNOSIS — I4891 Unspecified atrial fibrillation: Secondary | ICD-10-CM | POA: Diagnosis not present

## 2024-04-25 DIAGNOSIS — Z7984 Long term (current) use of oral hypoglycemic drugs: Secondary | ICD-10-CM | POA: Insufficient documentation

## 2024-04-25 DIAGNOSIS — Z7901 Long term (current) use of anticoagulants: Secondary | ICD-10-CM | POA: Insufficient documentation

## 2024-04-25 DIAGNOSIS — I428 Other cardiomyopathies: Secondary | ICD-10-CM | POA: Insufficient documentation

## 2024-04-25 DIAGNOSIS — F1011 Alcohol abuse, in remission: Secondary | ICD-10-CM | POA: Insufficient documentation

## 2024-04-25 DIAGNOSIS — I5022 Chronic systolic (congestive) heart failure: Secondary | ICD-10-CM | POA: Diagnosis present

## 2024-04-25 DIAGNOSIS — I5041 Acute combined systolic (congestive) and diastolic (congestive) heart failure: Secondary | ICD-10-CM

## 2024-04-25 LAB — BASIC METABOLIC PANEL WITH GFR
Anion gap: 10 (ref 5–15)
BUN: 5 mg/dL — ABNORMAL LOW (ref 6–20)
CO2: 27 mmol/L (ref 22–32)
Calcium: 8.8 mg/dL — ABNORMAL LOW (ref 8.9–10.3)
Chloride: 104 mmol/L (ref 98–111)
Creatinine, Ser: 1.12 mg/dL (ref 0.61–1.24)
GFR, Estimated: >60 mL/min (ref >60)
Glucose, Bld: 93 mg/dL (ref 70–99)
Potassium: 4.3 mmol/L (ref 3.5–5.1)
Sodium: 141 mmol/L (ref 135–145)

## 2024-04-25 MED ORDER — CARVEDILOL 12.5 MG PO TABS: 12.5000 mg | ORAL_TABLET | Freq: Two times a day (BID) | ORAL | 3 refills | Status: AC

## 2024-04-25 MED ORDER — SACUBITRIL-VALSARTAN 97-103 MG PO TABS: 1.0000 | ORAL_TABLET | Freq: Two times a day (BID) | ORAL | 1 refills | Status: AC

## 2024-04-25 NOTE — Patient Instructions (Signed)
 It was a pleasure seeing you today!  MEDICATIONS: -Increase Entresto  to 97-103 mg 1 tablet twice daily. You can take 2 of the 49/51 mg tablets twice daily until gone. -Increase carvedilol  to 12.5 mg 1 tablet twice daily. You can take 2 of the 6.25 mg tablets twice daily until gone. -Call if you have questions about your medications.  LABS: -We will call you if your labs need attention.  NEXT APPOINTMENT: Return to clinic in 2 weeks   In general, to take care of your heart failure: -Limit your fluid intake to 2 Liters (half-gallon) per day.   -Limit your salt intake to ideally 2-3 grams (2000-3000 mg) per day. -Weigh yourself daily and record, and bring that weight diary to your next appointment.  (Weight gain of 2-3 pounds in 1 day typically means fluid weight.) -The medications for your heart are to help your heart and help you live longer.   -Please contact us  before stopping any of your heart medications.  Call the clinic at [phone] with questions or to reschedule future appointments.

## 2024-04-25 NOTE — Progress Notes (Signed)
 Advanced Heart Failure Clinic Note  PCP: Oley Bascom RAMAN, NP PCP-Cardiologist: Jerel Balding, MD HF-Cardiologist: Ezra Shuck, MD  HPI:  [name] is a 53 y.o. with history of chronic systolic heart failure dating back to 2020, ETOH abuse, HTN, and tobacco abuse.     Admitted back in February 2020 with increased shortness of breath. At that time he was drinking 1/5 of liquor per day. ECHO completed and showed severely reduced EF 15-20%. 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 No formal cath. He was discharged 08/08/19. He returned for one appointment with cardiology but no return f/u after that.    Admitted 03/2020 for acute on chronic systolic heart failure + atrial flutter w/ RVR requiring DCCV. Also w/ low output requiring milrinone . R/LHC showed mildly elevated PCWP, normal RA pressure, normal cardiac output on milrinone  and no significant coronary disease. Echo showed severe LVEF <20%, severe RV dysfunction and severe MR. He was diuresed w/ IV Lasix . Was able to wean off milrinone . Discharge wt was 186 lb.    Echo in 06/2020 showed EF 25% with global hypokinesis, normal RV, trivial MR.    Follow up 03/2021, stable NYHA I and not volume overloaded. GDMT increased with elevated BP.    Admitted 09/2021 with AF with RVR. Started on amio gtt. Underwent DCCV to NSR. Echo showed EF 20-25% prominent apical trabeculations. 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 Mild MR. RV mildly HK. Amio changed to oral. Discharged home, weight 183 lbs.    Saw EP 09/22/21, s/p atrial flutter ablation 11/2021, apixaban  was stopped.   Echo in 12/2021 showed EF improved at 50%, normal RV   In 05/2022, he was noted to have atrial fibrillation and apixaban  was restarted.    In 07/2022, he had VT episode.  Echo showed EF 20-25%, normal RV.  Cardiac MRI showed LV EF 31%, basal septal mid-wall LGE, RV insertion site LGE, RV EF 38%.  [employer] ICD was placed.    Last seen by Dr. Shuck on 04/04/2024 where he was doing well. Entresto   was increased to 49-51 mg BID and carvedilol  was increased to 6.25 mg BID.  Today Bryan Guerrero returns to Heart Failure Clinic for pharmacist medication titration. Reports feeling well overall. Reports no change in symptoms since last visit. Denies dizziness lightheadedness fatigue chest pain palpitations SOB LEE orthopnea orthostasis PND. Reports being able to complete all activities of daily living (ADLs). 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 Takes no diuretic at home. Appetite is fair. Somewhat follows a low sodium diet.  Current Heart Failure Medications: Loop diuretic: furosemide  20 mg daily Beta-Blocker: carvedilol  6.25 mg BID ACEI/ARB/ARNI: Entresto  49-51 mg BID MRA: spironolactone  25 mg daily SGLT2i: Farxiga  10 mg daily Other:  Has the patient been experiencing any side effects to the medications prescribed? No  Does the patient have any problems obtaining medications due to transportation or finances? No  Understanding of regimen: Good  Understanding of indications: Good  Potential of adherence: Fair. Reports missing ~1 evening dose per week  Patient understands to avoid NSAIDs.  Patient understands to avoid decongestants.  Pertinent Lab Values: Creatinine, Ser  Date Value Ref Range Status  04/15/2024 0.97 0.61 - 1.24 mg/dL Final   BUN  Date Value Ref Range Status  04/15/2024 5 (L) 6 - 20 mg/dL Final  92/69/7974 11 6 - 24 mg/dL Final   Potassium  Date Value Ref Range Status  04/15/2024 3.0 (L) 3.5 - 5.1 mmol/L Final   Sodium  Date Value Ref  Range Status  04/15/2024 140 135 - 145 mmol/L Final  01/10/2024 141 134 - 144 mmol/L Final   B Natriuretic Peptide  Date Value Ref Range Status  04/04/2024 80.8 0.0 - 100.0 pg/mL Final    Comment:    Performed at [facility], [address] [place], [place] [address]   Magnesium   Date Value Ref Range Status  01/10/2024 1.8 1.6 - 2.3 mg/dL Final   Digoxin  Level   Date Value Ref Range Status  05/25/2020 1.3 0.8 - 2.0 ng/mL Final    Comment:    Please note change in reference range. Performed at [facility], [address] [place], [place] [address]    TSH  Date Value Ref Range Status  04/04/2024 1.406 0.350 - 4.500 uIU/mL Final    Comment:    Performed by a 3rd Generation assay with a functional sensitivity of <=0.01 uIU/mL. Performed at [facility], [address], [place], [place] [address]   01/10/2024 1.350 0.450 - 4.500 uIU/mL Final    Vital Signs: Today's Vitals   04/25/24 1302  BP: (!) 178/108  Pulse: 85  Weight: 192 lb 12.8 oz (87.5 kg)   Assessment/Plan: 1. Chronic Systolic CHF: Nonischemic cardiomyopathy, [employer] ICD. Admission 03/2020 for acute systolic CHF w/ low output requiring milrinone . Biventricular failure, echo 03/2020 EF < 20%, severe RV dysfunction, severe MR. LHC showed no significant coronary disease. CO good on milrinone , was able to wean off. 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  Echo  06/2020 with EF 25%, normal RV, trivial MR. Patient has history of heavy ETOH => he quit, but started back again. Cause of cardiomyopathy likely noncompaction versus ETOH. Echo 09/2021 EF 20-25% prominent apical trabeculations. 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 Echo in 12/2021 showed EF improved 50%, normal RV.  However, he had VT in 07/2022 and echo at that time showed EF back down to 20-25%.  Cardiac MRI in 07/2022 showed LV EF 31%, basal septal mid-wall LGE, RV insertion site LGE, RV EF 38%. NYHA class I-II today. Wt is stable from last visit. He is staying off ETOH, has ~1 beer per week now.  - Increase Coreg  to 12.5 mg bid.    - Continue spironolactone  25 mg daily.   - Increase Entresto  to 97/103 bid.  BMET/BNP today, BMET in 14 days.  - Continue Farxiga  10 mg daily.   2. Atrial flutter/fibrillation: s/p DCCV 03/2020.  Readmitted with AF RVR. Started on amiodarone  and underwent DCCV  (4/23) to NSR. S/p atrial flutter ablation 6/23.  Was noted in 05/2022 to have atrial fibrillation.  He is in NSR today. - Continue amiodarone  200 mg daily. Check TSH and CMET today. - Given age, need to consider atrial fibrillation ablation. Discuss next appt.  - Continue Eliquis  5 mg bid. 3. ETOH abuse: Says he is no longer drinking.  Follow up: APP clinic in 2 weeks  Please do not hesitate to reach out with questions or concerns,  Jaun Bash, PharmD, CPP, BCPS, [facility] Heart Failure Pharmacist  Phone - [phone] 04/25/2024 1:26 PM

## 2024-05-01 NOTE — Progress Notes (Deleted)
 Cardiology Office Note:  .   Date:  05/01/2024  ID:  Bryan Guerrero, DOB [birth year], MRN [id number] PCP: Oley Bascom RAMAN, NP  [facility] Providers Cardiologist:  Jerel Balding, MD Electrophysiologist:  OLE ONEIDA HOLTS, MD  Advanced Heart Failure:  Ezra Shuck, MD {  History of Present Illness: .   Bryan Guerrero is a 53 y.o. male w/PMHx of  HTN ETOH abuse AFlutter (s/p CTI ablation) Afib HFrEF Chronic CHF  Admitted Feb 2024 [facility] c/w VT, symptomatic with dizziness (no syncope) ICD implanted (loop removed) Prior question of non-impaction vs ETOH induced, though ETOH less likely to cause scar on cMRI C.MRI this admission Basal septal midwall LGE, which is a scar pattern seen in nonischemic cardiomyopathies and associated with worse prognosis.  Following with Dr. Leita Last 05/03/23 w/Dr. Holts, amiodarone  dose further reduced with plans to stop in 47mo if rhythm remained quiet.  Device alert for therapies on 11/27/23 AFib > AFlutter to what appeared 1:1  ATP delivered >> HV therapy Pt reported being very sick, suspected food poisoning was vomiting all morning Dr. Holts recommended resume amiodarone  at 200mg  daily  No showed to EP visit June Canceled gen cards visit July  I saw him 01/10/24 He feels well Very clearly recalls the day/shock, was feeling awful, thinks he ate something bad at a picnic/BBQ and was very nauseous, retching/vomiting.  Had just vinished vomiting again when he got shocked, was not presyncope, did not have syncope.  Eventually did recover from his illness ~24hrs. Had been feeling well up to then and since. No CP, palpitations or cardiac awareness No SOB, denies DOE Works at [employer], on his feet/gets plenty of steps in, cares for his home, reports good exertional capacity Device check  Inappropriate shock for rapid AFib > flutter with no further arrhythmia > suspected provoked by GI illness/vpmiting/retching No  changes were made  He saw HF team 04/05/23 Feeling well, no ETOH Coreg  and Entresto  up-titrated Discussed perhaps consideration for AFib ablation to get off amio Planned for updated echo   Today's visit is scheduled as a 4 mo f/u ROS:   *** AF burden *** VT hx > amiodarone  *** eliquis    Device Information BSci dual chamber ICD implanted 07/18/22  (RV lead is not on advisory list)   Arrhythmia/AAD hx AFlutter (ablated 11/23/21) Afib 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 2. RV insertion site LGE, which is a nonspecific scar pattern often seen in setting of elevated pulmonary pressures 3.  Mild LV dilatation with moderate systolic dysfunction (EF 31%) 4.  Normal RV size with moderate systolic dysfunction (EF 38%)     07/14/22: TTE 1. Left ventricular ejection fraction, by estimation, is 20 to 25%. The  left ventricle has severely decreased function. The left ventricle has no  regional wall motion abnormalities. The left ventricular internal cavity  size was moderately dilated. Left  ventricular diastolic parameters were normal.   2. Right ventricular systolic function is normal. The right ventricular  size is normal. Mildly increased  right ventricular wall thickness. There  is normal pulmonary artery systolic pressure.   3. Left atrial size was moderately dilated.   4. Right atrial size was mildly dilated.   5. The mitral valve is normal in structure. Trivial mitral valve  regurgitation. No evidence of mitral stenosis.   6. The  aortic valve is normal in structure. Aortic valve regurgitation is  not visualized. No aortic stenosis is present.   7. The inferior vena cava is normal in size with greater than 50%  respiratory variability, suggesting right atrial pressure of 3 mmHg.      Risk Assessment/Calculations:    Physical Exam:   VS:  There were no vitals taken for this visit.   Wt Readings from Last 3 Encounters:  04/25/24 192 lb 12.8 oz (87.5 kg)  04/04/24 191 lb 9.6 oz (86.9 kg)  01/10/24 189 lb 11.2 oz (86 kg)    GEN: Well nourished, well developed in no acute distress NECK: No JVD; No carotid bruits CARDIAC: *** RRR, no murmurs, rubs, gallops RESPIRATORY: *** CTA b/l without rales, wheezing or rhonchi  ABDOMEN: Soft, non-tender, non-distended EXTREMITIES: *** No edema; No deformity   *** ICD site: is stable, no thinning, fluctuation, tethering  ASSESSMENT AND PLAN: .    ICD paroxysmal AFib CHA2DS2Vasc is 2, on eliquis , appropriately dosed *** % burden  VT *** Well controlled Chronic amiodarone  ***  NICM *** No symptoms or exam finding so volume [facility] is ***, looks about his baseline C/w Dr. 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  Overdue to see Dr. Arletta team  Signed, Charlies Macario Arthur, PA-C

## 2024-05-02 ENCOUNTER — Encounter: Payer: Self-pay | Admitting: Cardiology

## 2024-05-06 ENCOUNTER — Ambulatory Visit: Attending: Cardiovascular Disease | Admitting: Physician Assistant

## 2024-05-07 ENCOUNTER — Encounter: Payer: Self-pay | Admitting: Physician Assistant

## 2024-06-04 ENCOUNTER — Ambulatory Visit (HOSPITAL_COMMUNITY)

## 2024-07-04 ENCOUNTER — Ambulatory Visit (HOSPITAL_COMMUNITY): Admitting: Cardiology

## 2024-07-04 ENCOUNTER — Ambulatory Visit (HOSPITAL_COMMUNITY): Admission: RE | Admit: 2024-07-04 | Payer: Self-pay

## 2024-07-17 ENCOUNTER — Ambulatory Visit: Payer: 59

## 2024-07-18 LAB — CUP PACEART REMOTE DEVICE CHECK
Battery Remaining Longevity: 132 mo
Battery Remaining Percentage: 100 %
Brady Statistic RA Percent Paced: 0 %
Brady Statistic RV Percent Paced: 0 %
Date Time Interrogation Session: 20260204023100
HighPow Impedance: 73 Ohm
Implantable Lead Connection Status: 753985
Implantable Lead Connection Status: 753985
Implantable Lead Implant Date: 20240205
Implantable Lead Implant Date: 20240205
Implantable Lead Location: 753859
Implantable Lead Location: 753860
Implantable Lead Model: 673
Implantable Lead Model: 7841
Implantable Lead Serial Number: 1388070
Implantable Lead Serial Number: 203387
Implantable Pulse Generator Implant Date: 20240205
Lead Channel Impedance Value: 580 Ohm
Lead Channel Impedance Value: 586 Ohm
Lead Channel Pacing Threshold Amplitude: 0.5 V
Lead Channel Pacing Threshold Amplitude: 0.7 V
Lead Channel Pacing Threshold Pulse Width: 0.4 ms
Lead Channel Pacing Threshold Pulse Width: 0.4 ms
Lead Channel Setting Pacing Amplitude: 2.5 V
Lead Channel Setting Pacing Amplitude: 2.5 V
Lead Channel Setting Pacing Pulse Width: 0.4 ms
Lead Channel Setting Sensing Sensitivity: 0.5 mV
Pulse Gen Serial Number: 652005
Zone Setting Status: 755011

## 2024-10-16 ENCOUNTER — Ambulatory Visit

## 2025-01-15 ENCOUNTER — Ambulatory Visit

## 2025-04-16 ENCOUNTER — Ambulatory Visit

## 2025-07-16 ENCOUNTER — Ambulatory Visit

## 2025-10-15 ENCOUNTER — Ambulatory Visit
